# Patient Record
Sex: Female | Born: 1935
Health system: Southern US, Community
[De-identification: ages and names within clinical notes are randomized; demographics above are authoritative.]

## PROBLEM LIST (undated history)

## (undated) DIAGNOSIS — K219 Gastro-esophageal reflux disease without esophagitis: Secondary | ICD-10-CM

## (undated) DIAGNOSIS — M1990 Unspecified osteoarthritis, unspecified site: Secondary | ICD-10-CM

## (undated) DIAGNOSIS — I10 Essential (primary) hypertension: Secondary | ICD-10-CM

## (undated) DIAGNOSIS — K862 Cyst of pancreas: Secondary | ICD-10-CM

## (undated) DIAGNOSIS — R55 Syncope and collapse: Secondary | ICD-10-CM

## (undated) DIAGNOSIS — K754 Autoimmune hepatitis: Secondary | ICD-10-CM

## (undated) DIAGNOSIS — E785 Hyperlipidemia, unspecified: Secondary | ICD-10-CM

## (undated) DIAGNOSIS — K8020 Calculus of gallbladder without cholecystitis without obstruction: Secondary | ICD-10-CM

## (undated) DIAGNOSIS — C8590 Non-Hodgkin lymphoma, unspecified, unspecified site: Secondary | ICD-10-CM

## (undated) DIAGNOSIS — H269 Unspecified cataract: Secondary | ICD-10-CM

## (undated) DIAGNOSIS — Z923 Personal history of irradiation: Secondary | ICD-10-CM

## (undated) DIAGNOSIS — M8580 Other specified disorders of bone density and structure, unspecified site: Secondary | ICD-10-CM

## (undated) DIAGNOSIS — E039 Hypothyroidism, unspecified: Secondary | ICD-10-CM

## (undated) DIAGNOSIS — L570 Actinic keratosis: Secondary | ICD-10-CM

## (undated) DIAGNOSIS — C4492 Squamous cell carcinoma of skin, unspecified: Secondary | ICD-10-CM

## (undated) DIAGNOSIS — C73 Malignant neoplasm of thyroid gland: Secondary | ICD-10-CM

## (undated) HISTORY — DX: Cyst of pancreas: K86.2

## (undated) HISTORY — PX: APPENDECTOMY: SHX54

## (undated) HISTORY — DX: Other specified disorders of bone density and structure, unspecified site: M85.80

## (undated) HISTORY — PX: LIVER BIOPSY: SHX301

## (undated) HISTORY — DX: Unspecified cataract: H26.9

## (undated) HISTORY — DX: Gastro-esophageal reflux disease without esophagitis: K21.9

## (undated) HISTORY — DX: Squamous cell carcinoma of skin, unspecified: C44.92

## (undated) HISTORY — DX: Non-Hodgkin lymphoma, unspecified, unspecified site: C85.90

## (undated) HISTORY — PX: THYROIDECTOMY: SHX17

## (undated) HISTORY — DX: Unspecified osteoarthritis, unspecified site: M19.90

## (undated) HISTORY — DX: Calculus of gallbladder without cholecystitis without obstruction: K80.20

## (undated) HISTORY — DX: Syncope and collapse: R55

## (undated) HISTORY — DX: Personal history of irradiation: Z92.3

## (undated) HISTORY — DX: Hypothyroidism, unspecified: E03.9

## (undated) HISTORY — DX: Actinic keratosis: L57.0

## (undated) HISTORY — DX: Hyperlipidemia, unspecified: E78.5

## (undated) HISTORY — DX: Essential (primary) hypertension: I10

## (undated) HISTORY — DX: Autoimmune hepatitis: K75.4

## (undated) HISTORY — PX: EXPLORATORY LAPAROTOMY: SUR591

## (undated) HISTORY — DX: Malignant neoplasm of thyroid gland: C73

---
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
## 1970-10-27 HISTORY — PX: TUBAL LIGATION: SHX77

## 1998-01-25 ENCOUNTER — Encounter: Admission: RE | Admit: 1998-01-25 | Discharge: 1998-04-25 | Payer: Self-pay | Admitting: Family Medicine

## 1999-03-22 ENCOUNTER — Other Ambulatory Visit: Admission: RE | Admit: 1999-03-22 | Discharge: 1999-03-22 | Payer: Self-pay | Admitting: Family Medicine

## 2000-02-13 ENCOUNTER — Encounter: Payer: Self-pay | Admitting: Family Medicine

## 2000-02-13 ENCOUNTER — Encounter: Admission: RE | Admit: 2000-02-13 | Discharge: 2000-02-13 | Payer: Self-pay | Admitting: Family Medicine

## 2000-02-21 ENCOUNTER — Ambulatory Visit (HOSPITAL_COMMUNITY): Admission: RE | Admit: 2000-02-21 | Discharge: 2000-02-21 | Payer: Self-pay | Admitting: Internal Medicine

## 2000-03-12 ENCOUNTER — Observation Stay (HOSPITAL_COMMUNITY): Admission: RE | Admit: 2000-03-12 | Discharge: 2000-03-13 | Payer: Self-pay | Admitting: General Surgery

## 2000-03-12 ENCOUNTER — Encounter: Payer: Self-pay | Admitting: General Surgery

## 2000-03-12 ENCOUNTER — Encounter (INDEPENDENT_AMBULATORY_CARE_PROVIDER_SITE_OTHER): Payer: Self-pay

## 2000-05-22 ENCOUNTER — Other Ambulatory Visit: Admission: RE | Admit: 2000-05-22 | Discharge: 2000-05-22 | Payer: Self-pay | Admitting: Family Medicine

## 2001-05-28 ENCOUNTER — Other Ambulatory Visit: Admission: RE | Admit: 2001-05-28 | Discharge: 2001-05-28 | Payer: Self-pay | Admitting: Family Medicine

## 2002-06-10 ENCOUNTER — Other Ambulatory Visit: Admission: RE | Admit: 2002-06-10 | Discharge: 2002-06-10 | Payer: Self-pay | Admitting: Family Medicine

## 2003-07-13 ENCOUNTER — Other Ambulatory Visit: Admission: RE | Admit: 2003-07-13 | Discharge: 2003-07-13 | Payer: Self-pay | Admitting: Family Medicine

## 2004-07-15 ENCOUNTER — Other Ambulatory Visit: Admission: RE | Admit: 2004-07-15 | Discharge: 2004-07-15 | Payer: Self-pay | Admitting: Family Medicine

## 2004-09-12 ENCOUNTER — Ambulatory Visit: Payer: Self-pay | Admitting: Family Medicine

## 2005-07-14 ENCOUNTER — Ambulatory Visit: Payer: Self-pay | Admitting: Family Medicine

## 2005-07-21 ENCOUNTER — Other Ambulatory Visit: Admission: RE | Admit: 2005-07-21 | Discharge: 2005-07-21 | Payer: Self-pay | Admitting: Family Medicine

## 2005-07-21 ENCOUNTER — Ambulatory Visit: Payer: Self-pay | Admitting: Family Medicine

## 2005-07-21 ENCOUNTER — Encounter: Payer: Self-pay | Admitting: Family Medicine

## 2006-02-24 HISTORY — PX: CHOLECYSTECTOMY: SHX55

## 2006-07-22 ENCOUNTER — Encounter: Payer: Self-pay | Admitting: Internal Medicine

## 2006-07-22 ENCOUNTER — Ambulatory Visit: Payer: Self-pay | Admitting: Internal Medicine

## 2006-07-22 ENCOUNTER — Other Ambulatory Visit: Admission: RE | Admit: 2006-07-22 | Discharge: 2006-07-22 | Payer: Self-pay | Admitting: Internal Medicine

## 2006-08-05 ENCOUNTER — Ambulatory Visit: Payer: Self-pay | Admitting: Internal Medicine

## 2006-08-26 ENCOUNTER — Encounter: Payer: Self-pay | Admitting: Internal Medicine

## 2006-09-09 ENCOUNTER — Ambulatory Visit: Payer: Self-pay | Admitting: Internal Medicine

## 2007-02-19 ENCOUNTER — Ambulatory Visit: Payer: Self-pay | Admitting: Internal Medicine

## 2007-08-11 ENCOUNTER — Encounter: Payer: Self-pay | Admitting: Internal Medicine

## 2007-08-25 ENCOUNTER — Ambulatory Visit: Payer: Self-pay | Admitting: Internal Medicine

## 2007-08-25 DIAGNOSIS — M8580 Other specified disorders of bone density and structure, unspecified site: Secondary | ICD-10-CM | POA: Insufficient documentation

## 2007-08-25 DIAGNOSIS — C8589 Other specified types of non-Hodgkin lymphoma, extranodal and solid organ sites: Secondary | ICD-10-CM | POA: Insufficient documentation

## 2007-08-25 DIAGNOSIS — Z9089 Acquired absence of other organs: Secondary | ICD-10-CM | POA: Insufficient documentation

## 2007-08-25 DIAGNOSIS — E785 Hyperlipidemia, unspecified: Secondary | ICD-10-CM | POA: Insufficient documentation

## 2007-08-25 DIAGNOSIS — I10 Essential (primary) hypertension: Secondary | ICD-10-CM | POA: Insufficient documentation

## 2007-08-25 DIAGNOSIS — K219 Gastro-esophageal reflux disease without esophagitis: Secondary | ICD-10-CM | POA: Insufficient documentation

## 2007-09-02 ENCOUNTER — Ambulatory Visit: Payer: Self-pay | Admitting: Internal Medicine

## 2007-09-06 ENCOUNTER — Encounter (INDEPENDENT_AMBULATORY_CARE_PROVIDER_SITE_OTHER): Payer: Self-pay | Admitting: *Deleted

## 2008-01-11 ENCOUNTER — Ambulatory Visit: Payer: Self-pay | Admitting: Internal Medicine

## 2008-01-11 DIAGNOSIS — E039 Hypothyroidism, unspecified: Secondary | ICD-10-CM | POA: Insufficient documentation

## 2008-01-17 LAB — CONVERTED CEMR LAB: TSH: 2.3 microintl units/mL (ref 0.35–5.50)

## 2008-08-21 ENCOUNTER — Encounter: Payer: Self-pay | Admitting: Internal Medicine

## 2008-08-30 ENCOUNTER — Encounter: Payer: Self-pay | Admitting: Internal Medicine

## 2008-08-30 ENCOUNTER — Other Ambulatory Visit: Admission: RE | Admit: 2008-08-30 | Discharge: 2008-08-30 | Payer: Self-pay | Admitting: Internal Medicine

## 2008-08-30 ENCOUNTER — Ambulatory Visit: Payer: Self-pay | Admitting: Internal Medicine

## 2008-08-30 LAB — HM PAP SMEAR

## 2008-09-01 ENCOUNTER — Encounter (INDEPENDENT_AMBULATORY_CARE_PROVIDER_SITE_OTHER): Payer: Self-pay | Admitting: *Deleted

## 2008-09-06 ENCOUNTER — Telehealth (INDEPENDENT_AMBULATORY_CARE_PROVIDER_SITE_OTHER): Payer: Self-pay | Admitting: *Deleted

## 2008-09-08 ENCOUNTER — Ambulatory Visit: Payer: Self-pay | Admitting: Internal Medicine

## 2008-09-09 ENCOUNTER — Encounter: Payer: Self-pay | Admitting: Internal Medicine

## 2008-09-11 ENCOUNTER — Encounter (INDEPENDENT_AMBULATORY_CARE_PROVIDER_SITE_OTHER): Payer: Self-pay | Admitting: *Deleted

## 2008-09-11 LAB — CONVERTED CEMR LAB
OCCULT 1: NEGATIVE
OCCULT 2: NEGATIVE
OCCULT 3: NEGATIVE

## 2008-09-12 ENCOUNTER — Encounter (INDEPENDENT_AMBULATORY_CARE_PROVIDER_SITE_OTHER): Payer: Self-pay | Admitting: *Deleted

## 2008-09-12 ENCOUNTER — Telehealth (INDEPENDENT_AMBULATORY_CARE_PROVIDER_SITE_OTHER): Payer: Self-pay | Admitting: *Deleted

## 2008-09-13 ENCOUNTER — Telehealth (INDEPENDENT_AMBULATORY_CARE_PROVIDER_SITE_OTHER): Payer: Self-pay | Admitting: *Deleted

## 2008-09-13 LAB — CONVERTED CEMR LAB
ALT: 25 units/L (ref 0–35)
AST: 23 units/L (ref 0–37)
BUN: 13 mg/dL (ref 6–23)
Basophils Absolute: 0 10*3/uL (ref 0.0–0.1)
Basophils Relative: 0.7 % (ref 0.0–3.0)
CO2: 26 meq/L (ref 19–32)
Calcium: 9.4 mg/dL (ref 8.4–10.5)
Chloride: 110 meq/L (ref 96–112)
Cholesterol: 167 mg/dL (ref 0–200)
Creatinine, Ser: 0.9 mg/dL (ref 0.4–1.2)
Eosinophils Absolute: 0.3 10*3/uL (ref 0.0–0.7)
Eosinophils Relative: 6.4 % — ABNORMAL HIGH (ref 0.0–5.0)
GFR calc Af Amer: 79 mL/min
GFR calc non Af Amer: 65 mL/min
Glucose, Bld: 92 mg/dL (ref 70–99)
HCT: 38.3 % (ref 36.0–46.0)
HDL: 42.2 mg/dL (ref >39.0)
Hemoglobin: 13.4 g/dL (ref 12.0–15.0)
LDL Cholesterol: 107 mg/dL — ABNORMAL HIGH (ref 0–99)
Lymphocytes Relative: 20.4 % (ref 12.0–46.0)
MCHC: 35 g/dL (ref 30.0–36.0)
MCV: 88.1 fL (ref 78.0–100.0)
Monocytes Absolute: 0.3 10*3/uL (ref 0.1–1.0)
Monocytes Relative: 7 % (ref 3.0–12.0)
Neutro Abs: 3.4 10*3/uL (ref 1.4–7.7)
Neutrophils Relative %: 65.5 % (ref 43.0–77.0)
Platelets: 168 10*3/uL (ref 150–400)
Potassium: 3.9 meq/L (ref 3.5–5.1)
RBC: 4.35 M/uL (ref 3.87–5.11)
RDW: 13.3 % (ref 11.5–14.6)
Sodium: 144 meq/L (ref 135–145)
TSH: 2.75 microintl units/mL (ref 0.35–5.50)
Total CHOL/HDL Ratio: 4
Triglycerides: 90 mg/dL (ref 0–149)
VLDL: 18 mg/dL (ref 0–40)
Vit D, 1,25-Dihydroxy: 20 — ABNORMAL LOW (ref 30–89)
WBC: 5 10*3/uL (ref 4.5–10.5)

## 2008-09-15 ENCOUNTER — Encounter: Payer: Self-pay | Admitting: Internal Medicine

## 2008-09-15 ENCOUNTER — Ambulatory Visit: Payer: Self-pay | Admitting: Internal Medicine

## 2008-09-18 ENCOUNTER — Telehealth (INDEPENDENT_AMBULATORY_CARE_PROVIDER_SITE_OTHER): Payer: Self-pay | Admitting: *Deleted

## 2008-10-11 ENCOUNTER — Telehealth (INDEPENDENT_AMBULATORY_CARE_PROVIDER_SITE_OTHER): Payer: Self-pay | Admitting: *Deleted

## 2009-04-09 ENCOUNTER — Ambulatory Visit: Payer: Self-pay | Admitting: Internal Medicine

## 2009-06-20 ENCOUNTER — Telehealth: Payer: Self-pay | Admitting: Internal Medicine

## 2009-06-21 ENCOUNTER — Telehealth (INDEPENDENT_AMBULATORY_CARE_PROVIDER_SITE_OTHER): Payer: Self-pay | Admitting: *Deleted

## 2009-08-03 ENCOUNTER — Ambulatory Visit: Payer: Self-pay | Admitting: Internal Medicine

## 2009-08-09 LAB — CONVERTED CEMR LAB
BUN: 14 mg/dL (ref 6–23)
CO2: 26 meq/L (ref 19–32)
Calcium: 9.5 mg/dL (ref 8.4–10.5)
Chloride: 101 meq/L (ref 96–112)
Creatinine, Ser: 1 mg/dL (ref 0.4–1.2)
GFR calc non Af Amer: 57.65 mL/min (ref >60)
Glucose, Bld: 107 mg/dL — ABNORMAL HIGH (ref 70–99)
Potassium: 4.5 meq/L (ref 3.5–5.1)
Sodium: 139 meq/L (ref 135–145)
TSH: 1.17 microintl units/mL (ref 0.35–5.50)

## 2009-08-22 ENCOUNTER — Encounter: Payer: Self-pay | Admitting: Internal Medicine

## 2009-12-26 ENCOUNTER — Telehealth (INDEPENDENT_AMBULATORY_CARE_PROVIDER_SITE_OTHER): Payer: Self-pay | Admitting: *Deleted

## 2010-01-03 ENCOUNTER — Encounter (INDEPENDENT_AMBULATORY_CARE_PROVIDER_SITE_OTHER): Payer: Self-pay | Admitting: *Deleted

## 2010-01-03 ENCOUNTER — Ambulatory Visit: Payer: Self-pay | Admitting: Internal Medicine

## 2010-01-04 ENCOUNTER — Encounter: Payer: Self-pay | Admitting: Internal Medicine

## 2010-01-04 LAB — CONVERTED CEMR LAB: Vit D, 25-Hydroxy: 33 ng/mL (ref 30–89)

## 2010-01-07 LAB — CONVERTED CEMR LAB
ALT: 29 units/L (ref 0–35)
AST: 28 units/L (ref 0–37)
Basophils Absolute: 0 10*3/uL (ref 0.0–0.1)
Basophils Relative: 0.1 % (ref 0.0–3.0)
Cholesterol: 175 mg/dL (ref 0–200)
Eosinophils Absolute: 0.3 10*3/uL (ref 0.0–0.7)
Eosinophils Relative: 5.7 % — ABNORMAL HIGH (ref 0.0–5.0)
HCT: 40.4 % (ref 36.0–46.0)
HDL: 52.1 mg/dL (ref >39.00)
Hemoglobin: 13.5 g/dL (ref 12.0–15.0)
LDL Cholesterol: 89 mg/dL (ref 0–99)
Lymphocytes Relative: 23.3 % (ref 12.0–46.0)
Lymphs Abs: 1.1 10*3/uL (ref 0.7–4.0)
MCHC: 33.3 g/dL (ref 30.0–36.0)
MCV: 90 fL (ref 78.0–100.0)
Monocytes Absolute: 0.4 10*3/uL (ref 0.1–1.0)
Monocytes Relative: 8 % (ref 3.0–12.0)
Neutro Abs: 2.9 10*3/uL (ref 1.4–7.7)
Neutrophils Relative %: 62.9 % (ref 43.0–77.0)
Platelets: 187 10*3/uL (ref 150.0–400.0)
RBC: 4.49 M/uL (ref 3.87–5.11)
RDW: 13.6 % (ref 11.5–14.6)
TSH: 0.98 microintl units/mL (ref 0.35–5.50)
Total CHOL/HDL Ratio: 3
Triglycerides: 170 mg/dL — ABNORMAL HIGH (ref 0.0–149.0)
VLDL: 34 mg/dL (ref 0.0–40.0)
WBC: 4.7 10*3/uL (ref 4.5–10.5)

## 2010-01-22 ENCOUNTER — Ambulatory Visit: Payer: Self-pay | Admitting: Internal Medicine

## 2010-01-26 LAB — CONVERTED CEMR LAB: Fecal Occult Bld: NEGATIVE

## 2010-03-18 ENCOUNTER — Telehealth (INDEPENDENT_AMBULATORY_CARE_PROVIDER_SITE_OTHER): Payer: Self-pay | Admitting: *Deleted

## 2010-04-26 ENCOUNTER — Ambulatory Visit: Payer: Self-pay | Admitting: Internal Medicine

## 2010-04-26 DIAGNOSIS — M1990 Unspecified osteoarthritis, unspecified site: Secondary | ICD-10-CM | POA: Insufficient documentation

## 2010-07-02 ENCOUNTER — Telehealth (INDEPENDENT_AMBULATORY_CARE_PROVIDER_SITE_OTHER): Payer: Self-pay | Admitting: *Deleted

## 2010-07-03 ENCOUNTER — Telehealth (INDEPENDENT_AMBULATORY_CARE_PROVIDER_SITE_OTHER): Payer: Self-pay | Admitting: *Deleted

## 2010-07-04 ENCOUNTER — Telehealth (INDEPENDENT_AMBULATORY_CARE_PROVIDER_SITE_OTHER): Payer: Self-pay | Admitting: *Deleted

## 2010-08-20 ENCOUNTER — Ambulatory Visit: Payer: Self-pay | Admitting: Internal Medicine

## 2010-08-21 ENCOUNTER — Telehealth: Payer: Self-pay | Admitting: Internal Medicine

## 2010-08-22 LAB — CONVERTED CEMR LAB
BUN: 18 mg/dL (ref 6–23)
CO2: 27 meq/L (ref 19–32)
Calcium: 9.2 mg/dL (ref 8.4–10.5)
Chloride: 101 meq/L (ref 96–112)
Creatinine, Ser: 0.9 mg/dL (ref 0.4–1.2)
GFR calc non Af Amer: 63.3 mL/min (ref >60)
Glucose, Bld: 119 mg/dL — ABNORMAL HIGH (ref 70–99)
Potassium: 4.3 meq/L (ref 3.5–5.1)
Sodium: 139 meq/L (ref 135–145)

## 2010-08-23 ENCOUNTER — Encounter: Payer: Self-pay | Admitting: Internal Medicine

## 2010-08-24 LAB — HM MAMMOGRAPHY: HM Mammogram: NORMAL

## 2010-09-05 ENCOUNTER — Encounter (INDEPENDENT_AMBULATORY_CARE_PROVIDER_SITE_OTHER): Payer: Self-pay | Admitting: *Deleted

## 2010-09-17 ENCOUNTER — Telehealth (INDEPENDENT_AMBULATORY_CARE_PROVIDER_SITE_OTHER): Payer: Self-pay | Admitting: *Deleted

## 2010-11-24 LAB — CONVERTED CEMR LAB
ALT: 30 units/L (ref 0–35)
AST: 23 units/L (ref 0–37)
BUN: 19 mg/dL (ref 6–23)
Basophils Absolute: 0 10*3/uL (ref 0.0–0.1)
Basophils Relative: 0.5 % (ref 0.0–1.0)
CO2: 33 meq/L — ABNORMAL HIGH (ref 19–32)
Calcium: 9.7 mg/dL (ref 8.4–10.5)
Chloride: 103 meq/L (ref 96–112)
Cholesterol: 189 mg/dL (ref 0–200)
Creatinine, Ser: 1.1 mg/dL (ref 0.4–1.2)
Direct LDL: 118.5 mg/dL
Eosinophils Absolute: 0.3 10*3/uL (ref 0.0–0.6)
Eosinophils Relative: 4.9 % (ref 0.0–5.0)
GFR calc Af Amer: 63 mL/min
GFR calc non Af Amer: 52 mL/min
Glucose, Bld: 97 mg/dL (ref 70–99)
HCT: 39.6 % (ref 36.0–46.0)
HDL: 44.4 mg/dL (ref >39.0)
Hemoglobin: 13.8 g/dL (ref 12.0–15.0)
Lymphocytes Relative: 21.4 % (ref 12.0–46.0)
MCHC: 34.9 g/dL (ref 30.0–36.0)
MCV: 89.1 fL (ref 78.0–100.0)
Monocytes Absolute: 0.5 10*3/uL (ref 0.2–0.7)
Monocytes Relative: 8.3 % (ref 3.0–11.0)
Neutro Abs: 3.8 10*3/uL (ref 1.4–7.7)
Neutrophils Relative %: 64.9 % (ref 43.0–77.0)
Pap Smear: NORMAL
Platelets: 210 10*3/uL (ref 150–400)
Potassium: 4.2 meq/L (ref 3.5–5.1)
RBC: 4.44 M/uL (ref 3.87–5.11)
RDW: 13.6 % (ref 11.5–14.6)
Sodium: 142 meq/L (ref 135–145)
TSH: 5.29 microintl units/mL (ref 0.35–5.50)
Total CHOL/HDL Ratio: 4.3
Triglycerides: 202 mg/dL (ref 0–149)
VLDL: 40 mg/dL (ref 0–40)
WBC: 5.8 10*3/uL (ref 4.5–10.5)

## 2010-11-26 NOTE — Progress Notes (Signed)
Summary: paz--refill  Phone Note Refill Request   Refills Requested: Medication #1:  HYDROCHLOROTHIAZIDE 25 MG  TABS 1 by mouth once daily  Medication #2:  SIMVASTATIN 20 MG  TABS 1/2 qd [facility] on [address] --[phone]  Initial call taken by: Velora Heckler,  September 12, 2008 10:21 AM      Prescriptions: SIMVASTATIN 20 MG  TABS (SIMVASTATIN) 1/2 qd  #30 x 5   Entered by:   Verdie Mosher   Authorized by:   Alda Berthold. Paz MD   Signed by:   Verdie Mosher on 09/12/2008   Method used:   Faxed to ...       [facility].* (retail)       [phone]       [place], [place]  [address]       Ph: [phone]       Fax: [phone]   RxID:   [id number] HYDROCHLOROTHIAZIDE 25 MG  TABS (HYDROCHLOROTHIAZIDE) 1 by mouth once daily  #30 x 5   Entered by:   Verdie Mosher   Authorized by:   Alda Berthold. Paz MD   Signed by:   Verdie Mosher on 09/12/2008   Method used:   Faxed to ...       [facility].* (retail)       [phone]       [place], [place]  [address]       Ph: [phone]       Fax: [phone]   RxID:   [id number]

## 2010-11-26 NOTE — Miscellaneous (Signed)
Summary: BONE DENSITY  Clinical Lists Changes  Orders: Added new Test order of T-Bone Densitometry [phone]) - Signed Added new Test order of T-Lumbar Vertebral Assessment [phone]) - Signed

## 2010-11-26 NOTE — Miscellaneous (Signed)
Summary: mammo  Clinical Lists Changes  Observations: Added new observation of MAMMOGRAM: normal (08/24/2010 7:42)      Preventive Care Screening  Mammogram:    Date:  08/24/2010    Results:  normal

## 2010-11-26 NOTE — Assessment & Plan Note (Signed)
Summary: 4MONTH RETURN VISIT/RH......Marland Kitchen   Vital Signs:  Patient profile:   77 year old female Weight:      159.25 pounds Pulse rate:   85 / minute Pulse rhythm:   regular BP sitting:   122 / 82  (left arm) Cuff size:   regular  Vitals Entered By: Allyn Kenner CMA (August 20, 2010 10:51 AM) CC: 6 month f/u- not fasting  Comments flu shot [facility]- [facility]    History of Present Illness: routine office visit, feeling well She went to see orthopedic surgeon for her neck pain, diagnosed with DJD, referred to physical therapy   which helped her a lot  Review of systems Denies chest pain or lower extremity edema just developed a yeast infection w/  vaginal itching, started over-the-counter medication yesterday, will call if not better in a few days. Denies dysuria Ambulatory SBP in the 110 range. Good medication compliance with all medications  Current Medications (verified): 1)  Hydrochlorothiazide 25 Mg  Tabs (Hydrochlorothiazide) .Marland Kitchen.. 1 By Mouth Once Daily 2)  Synthroid 100 Mcg  Tabs (Levothyroxine Sodium) .... Qd 3)  Bayer Low Strength 81 Mg  Tbec (Aspirin) .... Qd 4)  Simvastatin 20 Mg  Tabs (Simvastatin) .... 1/2 Qd 5)  Fosamax 70 Mg  Tabs (Alendronate Sodium) .... Q Wk 6)  Omeprazole 20 Mg Cpdr (Omeprazole) .Marland Kitchen.. 1 Daily 7)  Calcium-Vitamin D 250-125 Mg-Unit Tabs (Calcium Carbonate-Vitamin D) 8)  Tylenol Extra Strength 500 Mg Tabs (Acetaminophen) .Marland Kitchen.. 1 By Mouth Daily  Allergies (verified): 1)  ! Codeine  Past History:  Past Medical History: GERD, EGD for dysphagia 2000 aprox (-) Hyperlipidemia Osteopenia  Hypertension LYMPHOMA NECK, MLIG, UNSPECIFIED SITE dx 1970s  Hypothyroidism OA, severe at neck  Past Surgical History: Reviewed history from 04/09/2009 and no changes required. Tubal ligation (1972) Cholecystectomy (02/24/2006) Appendectomy THYROIDECTOMY   Social History: Reviewed history from 01/03/2010 and no changes required. Retired, worked at a  [employer]  Married 3 kids, 67 GK [facility]-- improved  tobacco-- never ETOH--no  Physical Exam  General:  alert, well-developed, and well-nourished.   Lungs:  normal respiratory effort, no intercostal retractions, no accessory muscle use, and normal breath sounds.   Heart:  normal rate, regular rhythm, and no murmur.   Extremities:  no edema   Impression & Recommendations:  Problem # 1:  DEGENERATIVE JOINT DISEASE (ICD-715.90) saw  orthopedic surgery few weeks ago d/t  neck pain, diagnosed with DJD, status post PT, doing better Her updated medication list for this problem includes:    Bayer Low Strength 81 Mg Tbec (Aspirin) ..... Qd    Tylenol Extra Strength 500 Mg Tabs (Acetaminophen) .Marland Kitchen... 1 by mouth daily  Problem # 2:  HYPOTHYROIDISM (ICD-244.9) stable, no change Her updated medication list for this problem includes:    Synthroid 100 Mcg Tabs (Levothyroxine sodium) ..... Qd  Labs Reviewed: TSH: 0.98 (01/03/2010)    Chol: 175 (01/03/2010)   HDL: 52.10 (01/03/2010)   LDL: 89 (01/03/2010)   TG: 170.0 (01/03/2010)  Problem # 3:  HYPERTENSION (ICD-401.9) well-controlled, check a BNP Her updated medication list for this problem includes:    Hydrochlorothiazide 25 Mg Tabs (Hydrochlorothiazide) .Marland Kitchen... 1 by mouth once daily  Orders: Venipuncture (29518) TLB-BMP (Basic Metabolic Panel-BMET) (84166-AYTKZSW) Specimen Handling (99000)  BP today: 122/82 Prior BP: 122/80 (04/26/2010)  Labs Reviewed: K+: 4.5 (08/03/2009) Creat: : 1.0 (08/03/2009)   Chol: 175 (01/03/2010)   HDL: 52.10 (01/03/2010)   LDL: 89 (01/03/2010)  TG: 170.0 (01/03/2010)  Problem # 4:  yeast infection see review of systems, will call if not better    Complete Medication List: 1)  Hydrochlorothiazide 25 Mg Tabs (Hydrochlorothiazide) .Marland Kitchen.. 1 by mouth once daily 2)  Synthroid 100 Mcg Tabs (Levothyroxine sodium) .... Qd 3)  Bayer Low Strength 81 Mg Tbec (Aspirin) .... Qd 4)   Simvastatin 20 Mg Tabs (Simvastatin) .... 1/2 qd 5)  Fosamax 70 Mg Tabs (Alendronate sodium) .... Q wk 6)  Omeprazole 20 Mg Cpdr (Omeprazole) .Marland Kitchen.. 1 daily 7)  Calcium-vitamin D 250-125 Mg-unit Tabs (Calcium carbonate-vitamin d) 8)  Tylenol Extra Strength 500 Mg Tabs (Acetaminophen) .Marland Kitchen.. 1 by mouth daily  Other Orders: Flu Vaccine 41yr + MEDICARE PATIENTS ((T1959 Administration Flu vaccine - MCR ((D4718  Patient Instructions: 1)  Please schedule a follow-up appointment in 5 to  6 months ,  fasting, physical exam   Orders Added: 1)  Flu Vaccine 369yr+ MEDICARE PATIENTS [Q2039] 2)  Administration Flu vaccine - MCR [G[Z5015])  Venipuncture [3[86825])  TLB-BMP (Basic Metabolic Panel-BMET) [8[74935-LEZVGJF])  Specimen Handling [99000] 6)  Est. Patient Level III [9[59539]lu Vaccine Consent Questions     Do you have a history of severe allergic reactions to this vaccine? no    Any prior history of allergic reactions to egg and/or gelatin? no    Do you have a sensitivity to the preservative Thimersol? no    Do you have a past history of Guillan-Barre Syndrome? no    Do you currently have an acute febrile illness? no    Have you ever had a severe reaction to latex? no    Vaccine information given and explained to patient? yes    Are you currently pregnant? no    Lot Number:[id number]   Exp Date:04/26/2011   Site Given  Left Deltoid IMration Flu vaccine - MCR [G[Y7289]    .[email or web address]

## 2010-11-26 NOTE — Assessment & Plan Note (Signed)
Summary: stiff neck/cbs   Vital Signs:  Patient profile:   77 year old female Weight:      162.13 pounds Pulse rate:   76 / minute Pulse rhythm:   regular BP sitting:   122 / 80  (left arm) Cuff size:   large  Vitals Entered By: Allyn Kenner CMA (April 26, 2010 1:51 PM) CC: Stiff neck x 2-3 weeks    History of Present Illness: long history of neck pain and neck deformity 2 weeks ago, the pain become more noticeable. No radiation Taking Tylenol as needed with some relief  Review of systems  denies any recent fall or injury Normal upper extremities paresthesias in No previous workup for his neck deformity, no x-rays or orthopedic evaluation  Allergies: 1)  ! Codeine  Past History:  Past Medical History: Reviewed history from 01/03/2010 and no changes required. GERD, EGD for dysphagia 2000 aprox (-) Hyperlipidemia Osteopenia  Hypertension LYMPHOMA NECK, MLIG, UNSPECIFIED SITE dx 1970s  Hypothyroidism  Past Surgical History: Reviewed history from 04/09/2009 and no changes required. Tubal ligation (1972) Cholecystectomy (02/24/2006) Appendectomy THYROIDECTOMY   Review of Systems      See HPI  Physical Exam  General:  alert and well-developed.   Neck:  neck range of motion is moderately limited, mostly when she tries to extend the neck. The neck  seems to be tilted forward from the base Extremities:  no lower extremity edema Neurologic:  alert & oriented X3, strength normal in all extremities, gait normal, and finger-to-nose normal.   Psych:  not anxious appearing and not depressed appearing.     Impression & Recommendations:  Problem # 1:  DEGENERATIVE JOINT DISEASE (ICD-715.90)  chronic neck pain and a chronic neck deformity likely from DJD rec.  orthopedic  to see her--Dr Rhona Raider or [facility]  Continue with Tylenol, add Flexeril Warned about drowsiness Her updated medication list for this problem includes:    Bayer Low Strength 81 Mg Tbec (Aspirin)  ..... Qd    Tylenol Extra Strength 500 Mg Tabs (Acetaminophen) .Marland Kitchen... 1 by mouth daily  Orders: Orthopedic Referral (Ortho)  Complete Medication List: 1)  Hydrochlorothiazide 25 Mg Tabs (Hydrochlorothiazide) .Marland Kitchen.. 1 by mouth once daily 2)  Synthroid 100 Mcg Tabs (Levothyroxine sodium) .... Qd 3)  Bayer Low Strength 81 Mg Tbec (Aspirin) .... Qd 4)  Simvastatin 20 Mg Tabs (Simvastatin) .... 1/2 qd 5)  Fosamax 70 Mg Tabs (Alendronate sodium) .... Q wk 6)  Otc Acid Reducer  7)  Omeprazole 20 Mg Cpdr (Omeprazole) .Marland Kitchen.. 1 daily 8)  Calcium-vitamin D 250-125 Mg-unit Tabs (Calcium carbonate-vitamin d) 9)  Tylenol Extra Strength 500 Mg Tabs (Acetaminophen) .Marland Kitchen.. 1 by mouth daily 10)  Flexeril 10 Mg Tabs (Cyclobenzaprine hcl) .... One by mouth at bedtime as needed for pain Prescriptions: FLEXERIL 10 MG TABS (CYCLOBENZAPRINE HCL) one by mouth at bedtime as needed for pain  #30 x 1   Entered and Authorized by:   Jacqulyn Bath E. Virlan Kempker MD   Signed by:   Alda Berthold. Severn Goddard MD on 04/26/2010   Method used:   Print then Give to Patient   RxID:   [id number]

## 2010-11-26 NOTE — Progress Notes (Signed)
Summary: Refill Request  Phone Note Refill Request Call back at [phone] Message from:  Pharmacy on Mar 18, 2010 8:56 AM  Refills Requested: Medication #1:  SYNTHROID 100 MCG  TABS qd   Dosage confirmed as above?Dosage Confirmed   Brand Name Necessary? No   Supply Requested: 3 months [facility]  Next Appointment Scheduled: none Initial call taken by: Elna Breslow,  Mar 18, 2010 8:57 AM  Follow-up for Phone Call        [facility]ED TO [facility] [phone] .Verdie Mosher  Mar 18, 2010 12:32 PM  Follow-up by: Verdie Mosher,  Mar 18, 2010 12:32 PM    Prescriptions: SYNTHROID 100 MCG  TABS (LEVOTHYROXINE SODIUM) qd  #90 x 1   Entered by:   Verdie Mosher   Authorized by:   Alda Berthold. Paz MD   Signed by:   Verdie Mosher on 03/18/2010   Method used:   Printed then faxed to ...       [facility] (mail-order)             , [place]         Ph: [phone]       Fax: [phone]   RxID:   [id number]

## 2010-11-26 NOTE — Letter (Signed)
Summary: Primary Care Consult Scheduled Letter  [facility]   [place], [place] [address]   Phone: [phone]  Fax: [phone]      09/01/2008 MRN: [id number]  [facility] [place], [place]  [address]    Dear Angela Clay,      We have scheduled an appointment for you.  At the recommendation of Dr.Paz, we have scheduled you a bone density at [facility] in the basement on November 20th at 10:30am.  Their address is [place]. The office phone number is [phone].  If this appointment day and time is not convenient for you, please feel free to call the office of the doctor you are being referred to at the number listed above and reschedule the appointment.     It is important for you to keep your scheduled appointments. We are here to make sure you are given good patient care. If you have questions or you have made changes to your appointment, please notify us at  [phone] , ask for Tiffany.    Thank you,  Patient Care Coordinator [place] at [facility]

## 2010-11-26 NOTE — Assessment & Plan Note (Signed)
Summary: acute/cough/alr   Vital Signs:  Patient profile:   77 year old female Height:      64 inches Weight:      174.6 pounds BMI:     30.08 Temp:     98 degrees F oral BP sitting:   142 / 80  (left arm) Cuff size:   large  Vitals Entered By: Dawson Bills (April 09, 2009 11:42 AM) CC: ACUTE ONLY Comments  - cough x 2 weeks  - productive this am ("brown glob")  - little PN drip  - hoarse Dawson Bills  April 09, 2009 11:42 AM    History of Present Illness: ACUTE ONLY  - cough x 2 weeks, mild   - productive this am ("brown glob")  - little PN drip,  hoarse  Current Medications (verified): 1)  Hydrochlorothiazide 25 Mg  Tabs (Hydrochlorothiazide) .Marland Kitchen.. 1 By Mouth Once Daily 2)  Synthroid 100 Mcg  Tabs (Levothyroxine Sodium) .... Qd 3)  Bayer Low Strength 81 Mg  Tbec (Aspirin) .... Qd 4)  Simvastatin 20 Mg  Tabs (Simvastatin) .... 1/2 Qd 5)  Fosamax 70 Mg  Tabs (Alendronate Sodium) .... Q Wk 6)  Otc Acid Reducer 7)  Premarin 0.625 Mg/gm  Crea (Estrogens, Conjugated) .Marland Kitchen.. 1gr  Vaginally Daily X 4 Weeks, The 3 Times A Week Only 8)  Prilosec Otc 20 Mg  Tbec (Omeprazole Magnesium) .Marland Kitchen.. 1 By Mouth Once Daily  Before Breakfast  Allergies (verified): 1)  ! Codeine  Past History:  Past Medical History: Reviewed history from 08/30/2008 and no changes required. GERD, EGD for dysphagia 2000 aprox (-) Hyperlipidemia Osteopenia, DEXA 2007 Hypertension LYMPHOMA NECK, MLIG, UNSPECIFIED SITE  Hypothyroidism  Past Surgical History: Tubal ligation (2353) Cholecystectomy (02/24/2006) Appendectomy THYROIDECTOMY   Social History: Reviewed history from 08/30/2008 and no changes required. Retired Married 3 kids, 8 [place]:  Denies fever. ENT:  Denies sore throat; no ear pain . Resp:  wheezing a little last week?. MS:  Denies muscle aches. Neuro:  had dizzines last week.  Physical Exam  General:  alert and well-developed.   Ears:  R ear normal and  L ear normal.   Mouth:  good dentition and pharynx pink and moist.   Lungs:  normal respiratory effort, no intercostal retractions, and no accessory muscle use.  few ronchi w/  cough only, no wheezing  Heart:  normal rate, regular rhythm, and no murmur.     Impression & Recommendations:  Problem # 1:  BRONCHITIS- ACUTE (ICD-466.0)  see instructions   Her updated medication list for this problem includes:    Doxycycline Hyclate 100 Mg Caps (Doxycycline hyclate) .Marland Kitchen... 1 by mouth two times a day  Problem # 2:  due for OV, pt aware  Complete Medication List: 1)  Hydrochlorothiazide 25 Mg Tabs (Hydrochlorothiazide) .Marland Kitchen.. 1 by mouth once daily 2)  Synthroid 100 Mcg Tabs (Levothyroxine sodium) .... Qd 3)  Bayer Low Strength 81 Mg Tbec (Aspirin) .... Qd 4)  Simvastatin 20 Mg Tabs (Simvastatin) .... 1/2 qd 5)  Fosamax 70 Mg Tabs (Alendronate sodium) .... Q wk 6)  Otc Acid Reducer  7)  Premarin 0.625 Mg/gm Crea (Estrogens, conjugated) .Marland Kitchen.. 1gr  vaginally daily x 4 weeks, the 3 times a week only 8)  Prilosec Otc 20 Mg Tbec (Omeprazole magnesium) .Marland Kitchen.. 1 by mouth once daily  before breakfast 9)  Doxycycline Hyclate 100 Mg Caps (Doxycycline hyclate) .Marland Kitchen.. 1 by mouth two times a day  Patient Instructions: 1)  Get plenty of rest, drink lots of clear liquids, and use Tylenol 2)  mucinex DM for cough 3)  antibiotic x 1 week (doxycycline) ----> avoid excessive sun exposure  4)  Return in 7-10 days if you're not better: sooner if you are  feeling worse.  Prescriptions: DOXYCYCLINE HYCLATE 100 MG CAPS (DOXYCYCLINE HYCLATE) 1 by mouth two times a day  #14 x 0   Entered and Authorized by:   Alda Berthold. Kamrin Spath MD   Signed by:   Alda Berthold. Kayleb Warshaw MD on 04/09/2009   Method used:   Electronically to        [facility].* (retail)       [phone]       [place], [place]  [address]       Ph: [phone]       Fax: [phone]   RxID:   [id number]

## 2010-11-26 NOTE — Assessment & Plan Note (Signed)
Summary: 4 MONTH ROA/CDJ   Vital Signs:  Patient Profile:   77 Years Old Female Height:     64.75 inches Weight:      181.4 pounds Pulse rate:   80 / minute BP sitting:   120 / 80  Vitals Entered By: Dawson Bills (January 11, 2008 1:31 PM)                 Chief Complaint:  ROV - NO CONCERNS.  History of Present Illness: ROV  GERD--on OTC , still occ sx depending on food occ dysphagia at baseline, see last OV note no odynophagia OSTEOPENIA--no CP after fosamax HYPERTENSION-- good amb BPs      Updated Prior Medication List: HYDROCHLOROTHIAZIDE 25 MG  TABS (HYDROCHLOROTHIAZIDE) 1 by mouth once daily SYNTHROID 100 MCG  TABS (LEVOTHYROXINE SODIUM) qd BAYER LOW STRENGTH 81 MG  TBEC (ASPIRIN) qd SIMVASTATIN 20 MG  TABS (SIMVASTATIN) 1/2 qd FOSAMAX 70 MG  TABS (ALENDRONATE SODIUM) q wk * OTC ACID REDUCER  DICLOFENAC SODIUM 75 MG  TBEC (DICLOFENAC SODIUM) prn PREMARIN 0.625 MG/GM  CREA (ESTROGENS, CONJUGATED) 1gr  vaginally daily x 4 weeks, the 3 times a week only  Current Allergies (reviewed today): ! CODEINE  Past Medical History:    GERD    Hyperlipidemia    Osteopenia, DEXA 2007    Hypertension    LYMPHOMA NEC, MLIG, UNSPECIFIED SITE     Hypothyroidism  Past Surgical History:    Tubal ligation (6967)    Cholecystectomy (02/24/2006)    Appendectomy    THYROIDECTOMY, HX OF (ICD-V45.79)    Risk Factors: Tobacco use:  never Alcohol use:  no  Mammogram History:    Date of Last Mammogram:  08/11/2007  PAP Smear History:    Date of Last PAP Smear:  07/28/2006   Review of Systems      See HPI  CV      Denies chest pain or discomfort and swelling of feet.  Resp      Denies shortness of breath.   Physical Exam  General:     alert, well-developed, and well-nourished.   Lungs:     normal respiratory effort, no intercostal retractions, no accessory muscle use, and normal breath sounds.   Heart:     normal rate, regular rhythm, and no murmur.      Impression & Recommendations:  Problem # 1:  HYPOTHYROIDISM (ICD-244.9) re check TSH  adjust med? Her updated medication list for this problem includes:    Synthroid 100 Mcg Tabs (Levothyroxine sodium) ..... Qd  Orders: TLB-TSH (Thyroid Stimulating Hormone) (84443-TSH) Venipuncture (89381)  Labs Reviewed: TSH: 5.29 (08/25/2007)    Chol: 189 (08/25/2007)   HDL: 44.4 (08/25/2007)   LDL: DEL (08/25/2007)   TG: 202 (08/25/2007)   Problem # 2:  GERD (ICD-530.81) rec prilosec once daily  goal: near zero sx Her updated medication list for this problem includes:    Prilosec Otc 20 Mg Tbec (Omeprazole magnesium) .Marland Kitchen... 1 by mouth once daily  before breakfast   Complete Medication List: 1)  Hydrochlorothiazide 25 Mg Tabs (Hydrochlorothiazide) .Marland Kitchen.. 1 by mouth once daily 2)  Synthroid 100 Mcg Tabs (Levothyroxine sodium) .... Qd 3)  Bayer Low Strength 81 Mg Tbec (Aspirin) .... Qd 4)  Simvastatin 20 Mg Tabs (Simvastatin) .... 1/2 qd 5)  Fosamax 70 Mg Tabs (Alendronate sodium) .... Q wk 6)  Otc Acid Reducer  7)  Diclofenac Sodium 75 Mg Tbec (Diclofenac sodium) .... Prn 8)  Premarin 0.625 Mg/gm Crea (  Estrogens, conjugated) .Marland Kitchen.. 1gr  vaginally daily x 4 weeks, the 3 times a week only 9)  Prilosec Otc 20 Mg Tbec (Omeprazole magnesium) .Marland Kitchen.. 1 by mouth once daily  before breakfast   Patient Instructions: 1)  Please schedule a follow-up appointment in 4 to 5  months.    Prescriptions: PRILOSEC OTC 20 MG  TBEC (OMEPRAZOLE MAGNESIUM) 1 by mouth once daily  before breakfast  #30 x 12   Entered and Authorized by:   Shunsuke Granzow E. Lauralee Waters MD   Signed by:   Alda Berthold. Naveah Brave MD on 01/11/2008   Method used:   Print then Give to Patient   RxID:   [id number]  ]

## 2010-11-26 NOTE — Assessment & Plan Note (Signed)
Summary: yearly   /KDC   Vital Signs:  Patient profile:   77 year old female Height:      64 inches Weight:      160 pounds BMI:     27.56 Pulse rate:   74 / minute BP sitting:   120 / 78  (left arm)  Vitals Entered By: Malachi Bonds (January 03, 2010 10:00 AM) CC: CPX    History of Present Illness: yearly checkup, chart reviewed  GERD-- no heartburn, prilosec works   Hyperlipidemia-- good medication compliance, no s/e ,life style great (see SH)   Hypertension-- good medication compliance, ambulatory BPs WNL  Hypothyroidism-- good medication compliance   d/c vag HRT  (cost)   Allergies: 1)  ! Codeine  Past History:  Past Medical History: GERD, EGD for dysphagia 2000 aprox (-) Hyperlipidemia Osteopenia  Hypertension LYMPHOMA NECK, MLIG, UNSPECIFIED SITE dx 1970s  Hypothyroidism  Past Surgical History: Reviewed history from 04/09/2009 and no changes required. 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 CV:  Denies chest pain or discomfort and swelling of feet. Resp:  Denies cough, shortness of breath, and wheezing. GI:  Denies bloody stools, nausea, and vomiting. GU:  no vag bleed . Psych:  Denies anxiety and depression.  Physical Exam  General:  alert, well-developed, and well-nourished.   Neck:  no masses and no cervical lymphadenopathy.   Breasts:  No mass, nodules, thickening, tenderness, bulging, retraction, inflamation, nipple discharge or skin changes noted.  no axilary LADs Lungs:  normal respiratory effort, no intercostal retractions, no accessory muscle use, and normal breath sounds.   Heart:  normal rate, regular rhythm, and no  murmur.   Abdomen:  soft, non-tender, no distention, and no masses.   Extremities:  no pretibial edema bilaterally  Psych:  Cognition and judgment appear intact. Alert and cooperative with normal attention span and concentration.  not anxious appearing and not depressed appearing.     Impression & Recommendations:  Problem # 1:  OSTEOPENIA (ICD-733.90) DEXA  08/2008 showed  stable osteopenia she is taking Fosamax since approximately 2007, takes calcium -vitamin D and exercise Plan:we agreed to d/c fosamax on 12-11 (holiday) and recheck a bone density test in 1 or 2  years Her updated medication list for this problem includes:    Fosamax 70 Mg Tabs (Alendronate sodium) ..... Q wk  Orders: T-Vitamin D (25-Hydroxy) [phone])  Problem # 2:  HEALTH SCREENING (ICD-V70.0) Td  11-09 Pneumina shot 2008 printed material provided regards shingles shot   never Cscope , (-) hemocults 11-08, 11-09 risk benefits of Cscope discussed, iFOB provided, Call if like Cscope   breast exam normal today  MMG  07-2008 (-) and (-) 10-10 PAP (-) 06-2006, (-) PAP 11-09,  has several (-) PAPs, next PAP in 2012 if so desire   Problem # 3:  HYPOTHYROIDISM (ICD-244.9) labs Her updated medication list for this problem includes:    Synthroid 100 Mcg Tabs (Levothyroxine sodium) ..... Qd  Labs Reviewed: TSH: 1.17 (08/03/2009)    Chol: 167 (09/08/2008)   HDL: 42.2 (09/08/2008)   LDL: 107 (09/08/2008)   TG: 90 (09/08/2008)  Orders: Venipuncture (34917) TLB-TSH (Thyroid Stimulating Hormone) (84443-TSH)  Problem # 4:  HYPERLIPIDEMIA (ICD-272.4) due for  blood work praised for  her healthy life style  Her updated medication list for this problem includes:    Simvastatin 20 Mg Tabs (Simvastatin) .Marland Kitchen... 1/2 qd    Labs Reviewed: SGOT: 23 (09/08/2008)   SGPT: 25 (09/08/2008)   HDL:42.2 (09/08/2008), 44.4 (08/25/2007)  LDL:107 (09/08/2008), DEL (08/25/2007)  Chol:167 (09/08/2008), 189 (08/25/2007)  Trig:90  (09/08/2008), 202 (08/25/2007)  Orders: TLB-Lipid Panel (80061-LIPID) TLB-ALT (SGPT) (84460-ALT) TLB-AST (SGOT) (84450-SGOT)  Problem # 5:  GERD (ICD-530.81) asx , we agreed to change prilosec to as needed only Her updated medication list for this problem includes:    Omeprazole 20 Mg Cpdr (Omeprazole) .Marland Kitchen... 1 daily     Complete Medication List: 1)  Hydrochlorothiazide 25 Mg Tabs (Hydrochlorothiazide) .Marland Kitchen.. 1 by mouth once daily 2)  Synthroid 100 Mcg Tabs (Levothyroxine sodium) .... Qd 3)  Bayer Low Strength 81 Mg Tbec (Aspirin) .... Qd 4)  Simvastatin 20 Mg Tabs (Simvastatin) .... 1/2 qd 5)  Fosamax 70 Mg Tabs (Alendronate sodium) .... Q wk 6)  Otc Acid Reducer  7)  Omeprazole 20 Mg Cpdr (Omeprazole) .Marland Kitchen.. 1 daily  Other Orders: TLB-CBC Platelet - w/Differential (85025-CBCD)  Patient Instructions: 1)  Please schedule a follow-up appointment in 6 months .  Prescriptions: OMEPRAZOLE 20 MG CPDR (OMEPRAZOLE) 1 daily  #90 x 1   Entered by:   Malachi Bonds   Authorized by:   Alda Berthold. Eliyohu Class MD   Signed by:   Malachi Bonds on 01/03/2010   Method used:   Faxed to ...       [facility] (mail-order)             , [place]         Ph: [phone]       Fax: [phone]   RxID:   [id number] FOSAMAX 70 MG  TABS (ALENDRONATE SODIUM) q wk  #12 x 1   Entered by:   Malachi Bonds   Authorized by:   Alda Berthold. Joe Gee MD   Signed by:   Malachi Bonds on 01/03/2010   Method used:   Faxed to ...       [facility] (mail-order)             , [place]         Ph: [phone]       Fax: [phone]   RxID:   [id number] SIMVASTATIN 20 MG  TABS (SIMVASTATIN) 1/2 qd  #45 x 1   Entered by:   Malachi Bonds   Authorized by:   Alda Berthold. Fabiano Ginley MD   Signed by:   Malachi Bonds on 01/03/2010   Method used:   Faxed to ...       [facility] (mail-order)             , [place]         Ph: [phone]       Fax: [phone]   RxID:   [id number] HYDROCHLOROTHIAZIDE 25 MG  TABS  (HYDROCHLOROTHIAZIDE) 1 by mouth once daily  #90 x 2   Entered by:   Malachi Bonds   Authorized by:   Alda Berthold. Shloimy Michalski MD   Signed by:   Malachi Bonds on 01/03/2010   Method used:   Faxed to ...       [facility] (mail-order)             , [place]         Ph: [phone]  Fax: [phone]   RxID:   [id number]

## 2010-11-26 NOTE — Progress Notes (Signed)
Summary: BMD/Labs   Phone Note Call from Patient Call back at Home Phone [phone]   Summary of Call: Patient left message on triage that she was advised to stop Fosamax in Dec, and sometime after that have bone density scan done. Patient would like to know how long after stopping the med should she have the BD scan. Please advise. Initial call taken by: Ernestene Mention CMA,  August 21, 2010 4:09 PM  Follow-up for Phone Call        to stop meds aprox Dec.2011, next scan  in 1 or 2 years (betwen 09-2011 and 09-2012)  Please adivse patient that labs are normal as well.Elna Breslow  August 22, 2010 8:52 AM Follow-up by: Alda Berthold. Cyleigh Massaro MD,  August 22, 2010 8:08 AM  Additional Follow-up for Phone Call Additional follow up Details #1::        Left message to call back.Marland KitchenMarland KitchenMarland KitchenElna Breslow  August 22, 2010 8:52 AM    Additional Follow-up for Phone Call Additional follow up Details #2::    Patient is aware of all results and to stop medicine in december. Patient will call later in in 2012 to sch BMD..Liz Honeycutt  August 22, 2010 8:55 AM

## 2010-11-26 NOTE — Progress Notes (Signed)
Summary: refill fosamax&simvastatin  Phone Note Refill Request Message from:  Fax from Pharmacy on July 03, 2010 2:09 PM  Refills Requested: Medication #1:  FOSAMAX 70 MG  TABS q wk  Medication #2:  SIMVASTATIN 20 MG  TABS 1/2 qd [facility] [phone]  Initial call taken by: Arbie Cookey [place],  July 03, 2010 2:10 PM  Follow-up for Phone Call        Previous rx went to wrong pharmacy number will refax.    Prescriptions: FOSAMAX 70 MG  TABS (ALENDRONATE SODIUM) q wk  #12 x 1   Entered by:   Allyn Kenner CMA   Authorized by:   Alda Berthold. Paz MD   Signed by:   Allyn Kenner CMA on 07/03/2010   Method used:   Reprint   RxID:   [id number] SIMVASTATIN 20 MG  TABS (SIMVASTATIN) 1/2 qd  #45 x 1   Entered by:   Allyn Kenner CMA   Authorized by:   Alda Berthold. Paz MD   Signed by:   Allyn Kenner CMA on 07/03/2010   Method used:   Reprint   RxID:   [id number]

## 2010-11-26 NOTE — Progress Notes (Signed)
Summary: refill on Fosamax  Phone Note Refill Request Message from:  Pharmacy on [facility]  Refills Requested: Medication #1:  FOSAMAX 70 MG  TABS q wk Dr.Paz  refill on Fosamax 63m tab  qty 4   [place] Vanessa [place]  September 18, 2008 3:46 PM      Prescriptions: FOSAMAX 70 MG  TABS (ALENDRONATE SODIUM) q wk  #4 x 12   Entered by:   MCarley Hammed  Authorized by:   JAlda Berthold Paz MD   Signed by:   MCarley Hammedon 09/19/2008   Method used:   Electronically to        [facility] (retail)       [phone]       [place]  [id number]      Ph: [phone]      Fax: [phone]  RxID:   [id number]

## 2010-11-26 NOTE — Progress Notes (Signed)
Summary: refill  Phone Note Refill Request Message from:  Fax from Pharmacy on July 02, 2010 1:24 PM  Refills Requested: Medication #1:  FOSAMAX 70 MG  TABS q wk  Medication #2:  SIMVASTATIN 20 MG  TABS 1/2 qd [facility] - fax [phone]  Initial call taken by: Arbie Cookey [place],  July 02, 2010 1:25 PM    Prescriptions: FOSAMAX 70 MG  TABS (ALENDRONATE SODIUM) q wk  #12 x 1   Entered and Authorized by:   Allyn Kenner CMA   Signed by:   Allyn Kenner CMA on 07/02/2010   Method used:   Faxed to ...       [facility] (mail-order)             , [place]         Ph: [phone]       Fax: [phone]   RxID:   [id number] SIMVASTATIN 20 MG  TABS (SIMVASTATIN) 1/2 qd  #45 x 1   Entered and Authorized by:   Allyn Kenner CMA   Signed by:   Allyn Kenner CMA on 07/02/2010   Method used:   Faxed to ...       [facility] (mail-order)             , [place]         Ph: [phone]       Fax: [phone]   RxID:   [id number]

## 2010-11-26 NOTE — Letter (Signed)
Summary: [facility]: Immunoassay Fecal Occult Blood (iFOB) Order Form  [facility]   [place], [place] [address]   Phone: [phone]  Fax: [phone]      [facility]: Immunoassay Fecal Occult Blood (iFOB) Order Form   January 03, 2010 MRN: [id number]   [name] 1933-12-02   Physicican Name:___________Paz____________  Diagnosis Code:_________v76.49_________________      Angela Clay

## 2010-11-26 NOTE — Progress Notes (Signed)
Summary: due rov  Phone Note Outgoing Call Call back at [facility] Phone [phone] Call back at Work Phone [phone]   Summary of Call: Patient is due rov -- pls call & schedule Dawson Bills  June 21, 2009 2:39 PM     Additional Follow-up for Phone Call Additional follow up Details #2::    Patient stated she has an appt on sept 7,2010 is this ok. Follow-up by: Silva Bandy,  June 21, 2009 3:01 PM

## 2010-11-26 NOTE — Progress Notes (Signed)
Summary: refax refill corrected signature   Phone Note Refill Request Message from:  Fax from Pharmacy on July 04, 2010 10:55 AM  Refills Requested: Medication #1:  SIMVASTATIN 20 MG  TABS 1/2 qd  Medication #2:  FOSAMAX 70 MG  TABS q wk [facility] [phone] - refax - rx was signed by cma  Initial call taken by: Arbie Cookey [place],  July 04, 2010 10:56 AM    Prescriptions: FOSAMAX 70 MG  TABS (ALENDRONATE SODIUM) q wk  #12 x 1   Entered by:   Allyn Kenner CMA   Authorized by:   Alda Berthold. Paz MD   Signed by:   Allyn Kenner CMA on 07/04/2010   Method used:   Printed then faxed to ...       [facility] (mail-order)             , [place]         Ph: [phone]       Fax: [phone]   RxID:   [id number] SIMVASTATIN 20 MG  TABS (SIMVASTATIN) 1/2 qd  #45 x 1   Entered by:   Allyn Kenner CMA   Authorized by:   Alda Berthold. Paz MD   Signed by:   Allyn Kenner CMA on 07/04/2010   Method used:   Printed then faxed to ...       [facility] (mail-order)             , [place]         Ph: [phone]       Fax: [phone]   RxID:   [id number]

## 2010-11-26 NOTE — Progress Notes (Signed)
Summary: vit d results  Phone Note Outgoing Call   Details for Reason: VIT D RESULTS: Vit D low,rec. Vit D at least 600u once daily along w/ Calcium Signed by Alda Berthold. Paz MD on 09/13/2008 at 6:17 AM  Summary of Call: DISCUSSED WITH PT ....Marland KitchenMarland KitchenDawson Clay  September 13, 2008 9:57 AM

## 2010-11-26 NOTE — Progress Notes (Signed)
Summary: refill  Phone Note Refill Request Message from:  Fax from Pharmacy on December 26, 2009 10:44 AM  Refills Requested: Medication #1:  SYNTHROID 100 MCG  TABS qd [facility] fax [phone]   Method Requested: Mail to Pharmacy Next Appointment Scheduled: 01/03/2010 Initial call taken by: Silva Bandy,  December 26, 2009 10:45 AM    Prescriptions: SYNTHROID 100 MCG  TABS (LEVOTHYROXINE SODIUM) qd  #90 x 0   Entered by:   Dawson Bills   Authorized by:   Alda Berthold. Paz MD   Signed by:   Dawson Bills on 12/26/2009   Method used:   Faxed to ...       [facility] (mail-order)             , [place]         Ph: [phone]       Fax: [phone]   RxID:   [id number]

## 2010-11-26 NOTE — Progress Notes (Signed)
Summary: dexa results  Phone Note Outgoing Call Call back at Home Phone [phone] Call back at Work Phone [phone]   Details for Reason: BONE DENSITY RESULTS: advise patient, bone density test is stable compare to previous DEXA continue with Fosamax, vitamin D, HRT, calcium and exercise recheck a bone density test in two years Signed by [facility] E. Paz MD on 10/10/2008 at 6:27 PM  Summary of Call: Left message on machine to return call.......Marland KitchenDawson Bills  October 11, 2008 4:11 PM discussed with patient .Marland KitchenMarland KitchenDawson Bills  October 12, 2008 12:02 PM

## 2010-11-26 NOTE — Letter (Signed)
Summary: Results Follow up Letter  [facility]   [place], [place] [address]   Phone: [phone]  Fax: [phone]    09/11/2008 MRN: [id number]  [facility] [place], [place]  [address]  Dear Angela Clay,  The following are the results of your recent test(s):  Test         Result    Pap Smear:        Normal _____  Not Normal _____ Comments: ______________________________________________________ Cholesterol: LDL(Bad cholesterol):         Your goal is less than:         HDL (Good cholesterol):       Your goal is more than: Comments:  ______________________________________________________ Mammogram:        Normal _____  Not Normal _____ Comments:  ___________________________________________________________________ Hemoccult:        Normal __x_  Not normal _______ Comments:    _____all stool cards were negative for blood ________________________________________________________________ Other Tests:    We routinely do not discuss normal results over the telephone.  If you desire a copy of the results, or you have any questions about this information we can discuss them at your next office visit.   Sincerely,

## 2010-11-26 NOTE — Letter (Signed)
Summary: Results Follow up Letter  [facility]   [place], [place] [address]   Phone: [phone]  Fax: [phone]    09/12/2008 MRN: [id number]  [facility] [place], [place]  [address]  Dear Ms. Ennen,  The following are the results of your recent test(s):  Test         Result    Pap Smear:        Normal __x___  Not Normal _____ Comments: ______________next pap due in 1 year ________________________________________ Cholesterol: LDL(Bad cholesterol):         Your goal is less than:         HDL (Good cholesterol):       Your goal is more than: Comments:  ______________________________________________________ Mammogram:        Normal _____  Not Normal _____ Comments:  ___________________________________________________________________ Hemoccult:        Normal _____  Not normal _______ Comments:    _____________________________________________________________________ Other Tests:  SEE ATTACHED LABS AND COMMENTS   We routinely do not discuss normal results over the telephone.  If you desire a copy of the results, or you have any questions about this information we can discuss them at your next office visit.   Sincerely,

## 2010-11-26 NOTE — Assessment & Plan Note (Signed)
Summary: yearly check/cbs   Vital Signs:  Patient Profile:   77 Years Old Female Height:     64 inches Weight:      169 pounds Pulse rate:   68 / minute Pulse rhythm:   regular BP sitting:   110 / 70  (left arm) Cuff size:   large  Vitals Entered By: Dawson Bills (August 30, 2008 1:09 PM)                 Chief Complaint:  yearly/pap; not fasting; RF ON PREMARIN.  History of Present Illness: yearly/pap; not fasting; RF ON        GERD-- symptoms well control       Hyperlipidemia-- on meds, no s/e        Osteopenia-- on meds, no s/e        Hypertension--no ambulatory BPs recently        Hypothyroidism-- good compliance     Updated Prior Medication List: HYDROCHLOROTHIAZIDE 25 MG  TABS (HYDROCHLOROTHIAZIDE) 1 by mouth once daily SYNTHROID 100 MCG  TABS (LEVOTHYROXINE SODIUM) qd BAYER LOW STRENGTH 81 MG  TBEC (ASPIRIN) qd SIMVASTATIN 20 MG  TABS (SIMVASTATIN) 1/2 qd FOSAMAX 70 MG  TABS (ALENDRONATE SODIUM) q wk * OTC ACID REDUCER  PREMARIN 0.625 MG/GM  CREA (ESTROGENS, CONJUGATED) 1gr  vaginally daily x 4 weeks, the 3 times a week only PRILOSEC OTC 20 MG  TBEC (OMEPRAZOLE MAGNESIUM) 1 by mouth once daily  before breakfast  Current Allergies (reviewed today): ! CODEINE  Past Medical History:    Reviewed history from 01/11/2008 and no changes required:       GERD, EGD for dysphagia 2000 aprox (-)       Hyperlipidemia       Osteopenia, DEXA 2007       Hypertension       LYMPHOMA NECK, MLIG, UNSPECIFIED SITE        Hypothyroidism  Past Surgical History:    Reviewed history from 01/11/2008 and no changes required:       Tubal ligation (1972)       Cholecystectomy (02/24/2006)       Appendectomy       THYROIDECTOMY, HX OF (ICD-V45.79)   Family History:    Reviewed history from 08/25/2007 and no changes required:       DM--no       colon ca--no       breast ca--no  Social History:    Reviewed history from 08/25/2007 and no changes required:       Retired      Married       3 kids, 8 GK   Risk Factors: Tobacco use:  never Alcohol use:  no  Mammogram History:    Date of Last Mammogram:  08/11/2007  PAP Smear History:    Date of Last PAP Smear:  07/28/2006   Review of Systems  CV      Denies chest pain or discomfort and swelling of feet.  Resp      Denies shortness of breath.  GI      Denies bloody stools, diarrhea, and nausea.  GU      no vag bleed except when she has intercourse, never got cream filled: redo Rx  Psych      Denies anxiety.   Physical Exam  General:     alert and well-developed.   Neck:     no thyromegaly and normal carotid upstroke.   Breasts:  No mass, nodules, thickening, tenderness, bulging, retraction, inflamation, nipple discharge or skin changes noted.  No axilary LADs Lungs:     normal respiratory effort, no intercostal retractions, no accessory muscle use, and normal breath sounds.   Heart:     normal rate, regular rhythm, and no murmur.   Abdomen:     soft, non-tender, no distention, and no masses.   Rectal:     No external abnormalities noted. Normal sphincter tone. No rectal masses or tenderness. Hem (-)  Genitalia:     no external lesions, no vaginal discharge, and no friaility or hemorrhage.  Membranes slightly  pale and dry Bimanual--no mass Extremities:     no pretibial edema bilaterally  Psych:     Oriented X3, memory intact for recent and remote, normally interactive, good eye contact, not anxious appearing, and not depressed appearing.      Impression & Recommendations:  Problem # 1:  HYPOTHYROIDISM (ICD-244.9) labs Her updated medication list for this problem includes:    Synthroid 100 Mcg Tabs (Levothyroxine sodium) ..... Qd  Labs Reviewed: TSH: 2.30 (01/11/2008)    Chol: 189 (08/25/2007)   HDL: 44.4 (08/25/2007)   LDL: DEL (08/25/2007)   TG: 202 (08/25/2007)   Problem # 2:  HEALTH SCREENING (ICD-V70.0) never Cscope , (-) hemocults 11-08 risk benefits of  Cscope discussed, Hemocult provided, Call if like Cscope Td  11-09 Pneumina shot 2008 printed material provided regards shingles shot  MMG  07-2008 (-)  PAP (-) 06-2006, repeat today; has several (-) PAPs, next PAP in 3 years if so desire  Problem # 3:  HYPERLIPIDEMIA (ICD-272.4) labs Her updated medication list for this problem includes:    Simvastatin 20 Mg Tabs (Simvastatin) .Marland Kitchen... 1/2 qd  Labs Reviewed: Chol: 189 (08/25/2007)   HDL: 44.4 (08/25/2007)   LDL: DEL (08/25/2007)   TG: 202 (08/25/2007) SGOT: 23 (08/25/2007)   SGPT: 30 (08/25/2007)   Problem # 4:  OSTEOPENIA (ICD-733.90) needs a DEXA Her updated medication list for this problem includes:    Fosamax 70 Mg Tabs (Alendronate sodium) ..... Q wk   Orders: Radiology Referral (Radiology)   Problem # 5:  HYPERTENSION (ICD-401.9) at goal  Her updated medication list for this problem includes:    Hydrochlorothiazide 25 Mg Tabs (Hydrochlorothiazide) .Marland Kitchen... 1 by mouth once daily  BP today: 110/70 Prior BP: 120/80 (01/11/2008)  Labs Reviewed: Creat: 1.1 (08/25/2007) Chol: 189 (08/25/2007)   HDL: 44.4 (08/25/2007)   LDL: DEL (08/25/2007)   TG: 202 (08/25/2007)   Complete Medication List: 1)  Hydrochlorothiazide 25 Mg Tabs (Hydrochlorothiazide) .Marland Kitchen.. 1 by mouth once daily 2)  Synthroid 100 Mcg Tabs (Levothyroxine sodium) .... Qd 3)  Bayer Low Strength 81 Mg Tbec (Aspirin) .... Qd 4)  Simvastatin 20 Mg Tabs (Simvastatin) .... 1/2 qd 5)  Fosamax 70 Mg Tabs (Alendronate sodium) .... Q wk 6)  Otc Acid Reducer  7)  Premarin 0.625 Mg/gm Crea (Estrogens, conjugated) .Marland Kitchen.. 1gr  vaginally daily x 4 weeks, the 3 times a week only 8)  Prilosec Otc 20 Mg Tbec (Omeprazole magnesium) .Marland Kitchen.. 1 by mouth once daily  before breakfast  Other Orders: Tetanus Toxoid w/Dx (01779) Admin 1st Vaccine [phone])   Patient Instructions: 1)  Came back fasting:  2)  Vit D level, FLP CBC AST ALT BMP TSH 3)  (use all Dx in the chart) 4)  Please  schedule a follow-up appointment in 6 months.   Prescriptions: PREMARIN 0.625 MG/GM  CREA (ESTROGENS, CONJUGATED) 1gr  vaginally daily  x 4 weeks, the 3 times a week only  #1 x 12   Entered and Authorized by:   [employer] E. Haruna Rohlfs MD   Signed by:   Alda Berthold. Peola Joynt MD on 08/30/2008   Method used:   Print then Give to Patient   RxID:   [id number]  ]  Tetanus/Td Vaccine    Vaccine Type: Td    Site: right deltoid    Dose: 0.5 ml    Route: IM    Given by: Dawson Bills    Exp. Date: 01/09/2010    Lot #: [id number]

## 2010-11-26 NOTE — Letter (Signed)
Summary: Results Follow up Letter  [facility]   [place], [place] [address]   Phone: [phone]  Fax: [phone]    09/06/2007 MRN: [id number]  [facility] [place], [place]  [address]  Dear Ms. Outman,  The following are the results of your recent test(s):  Test         Result    Pap Smear:        Normal _____  Not Normal _____ Comments: ______________________________________________________ Cholesterol: LDL(Bad cholesterol):         Your goal is less than:         HDL (Good cholesterol):       Your goal is more than: Comments:  ______________________________________________________ Mammogram:        Normal _____  Not Normal _____ Comments:  ___________________________________________________________________ Hemoccult:        Normal _x____  Not normal _______ Comments:    __all stool cards were normal ___________________________________________________________________ Other Tests:    We routinely do not discuss normal results over the telephone.  If you desire a copy of the results, or you have any questions about this information we can discuss them at your next office visit.   Sincerely,

## 2010-11-26 NOTE — Progress Notes (Signed)
Summary: refill  Phone Note Refill Request Message from:  Fax from Pharmacy on September 17, 2010 4:47 PM  Refills Requested: Medication #1:  SYNTHROID 100 MCG  TABS qd  Medication #2:  HYDROCHLOROTHIAZIDE 25 MG  TABS 1 by mouth once daily [facility] - fax [phone] Angela Clay [phone]  Initial call taken by: Arbie [place],  September 17, 2010 4:48 PM    Prescriptions: SYNTHROID 100 MCG  TABS (LEVOTHYROXINE SODIUM) qd  #90 x 1   Entered by:   [place] by:   Alda Berthold. Paz MD   Signed by:   Allyn Kenner CMA on 09/17/2010   Method used:   Faxed to ...       [facility] (mail-order)             , [place]         Ph: [phone]       Fax: [phone]   RxID:   [id number] HYDROCHLOROTHIAZIDE 25 MG  TABS (HYDROCHLOROTHIAZIDE) 1 by mouth once daily  #90 x 1   Entered by:   Allyn Kenner CMA   Authorized by:   Alda Berthold. Paz MD   Signed by:   Allyn Kenner CMA on 09/17/2010   Method used:   Faxed to ...       [facility] (mail-order)             , [place]         Ph: [phone]       Fax: [phone]   RxID:   [id number]

## 2010-12-23 ENCOUNTER — Encounter: Payer: Self-pay | Admitting: Internal Medicine

## 2010-12-23 ENCOUNTER — Ambulatory Visit (INDEPENDENT_AMBULATORY_CARE_PROVIDER_SITE_OTHER): Payer: Medicare PPO | Admitting: Internal Medicine

## 2010-12-23 DIAGNOSIS — J069 Acute upper respiratory infection, unspecified: Secondary | ICD-10-CM

## 2011-01-02 NOTE — Assessment & Plan Note (Signed)
Summary: cold/kn   Vital Signs:  Patient profile:   77 year old female Height:      64 inches Weight:      164.50 pounds BMI:     28.34 Temp:     98.2 degrees F oral Pulse rate:   70 / minute Pulse rhythm:   regular BP sitting:   128 / 82  (left arm) Cuff size:   regular  Vitals Entered By: Rico (December 23, 2010 10:23 AM) CC: "cold" x 3 weeks Comments mostly in head minor cough taking tyelnol cold [facility]    History of Present Illness: "cold" x 3 weeks  sinus congestion , lack of energy , mild cough  Taking over-the-counter cold medicines.     Current Medications (verified): 1)  Hydrochlorothiazide 25 Mg  Tabs (Hydrochlorothiazide) .Marland Kitchen.. 1 By Mouth Once Daily 2)  Synthroid 100 Mcg  Tabs (Levothyroxine Sodium) .... Qd 3)  Bayer Low Strength 81 Mg  Tbec (Aspirin) .... Qd 4)  Simvastatin 20 Mg  Tabs (Simvastatin) .... 1/2 Qd 5)  Omeprazole 20 Mg Cpdr (Omeprazole) .Marland Kitchen.. 1 Daily 6)  Calcium-Vitamin D 250-125 Mg-Unit Tabs (Calcium Carbonate-Vitamin D) 7)  Tylenol Extra Strength 500 Mg Tabs (Acetaminophen) .Marland Kitchen.. 1 By Mouth Daily  Allergies (verified): 1)  ! Codeine  Past History:  Past Medical History: Reviewed history from 08/20/2010 and no changes required. GERD, EGD for dysphagia 2000 aprox (-) Hyperlipidemia Osteopenia  Hypertension LYMPHOMA NECK, MLIG, UNSPECIFIED SITE dx 1970s  Hypothyroidism OA, severe at neck  Past Surgical History: Reviewed history from 04/09/2009 and no changes required. Tubal ligation (1972) Cholecystectomy (02/24/2006) Appendectomy THYROIDECTOMY   Social History: Reviewed history from 01/03/2010 and no changes required. Retired, worked at a [employer]  Married 3 kids, 31 GK [facility]-- improved  tobacco-- never ETOH--no  Review of Systems General:  Denies fever. Resp:  Denies wheezing; cough sputum, greenish, x the last 2 days only milkd SOB?. GI:  Denies diarrhea,  nausea, and vomiting. MS:  Denies muscle aches.  Physical Exam  General:  alert and well-developed.   Head:   face symmetric, nontender Ears:  R ear normal and L ear normal.   Nose:   slightly congested Mouth:   no redness or discharge Lungs:  normal respiratory effort, no intercostal retractions, no accessory muscle use, and normal breath sounds.   Heart:  normal rate, regular rhythm, and no murmur.     Impression & Recommendations:  Problem # 1:  URI (ICD-465.9)  see  instructions Her updated medication list for this problem includes:    Bayer Low Strength 81 Mg Tbec (Aspirin) ..... Qd    Tylenol Extra Strength 500 Mg Tabs (Acetaminophen) .Marland Kitchen... 1 by mouth daily  Complete Medication List: 1)  Hydrochlorothiazide 25 Mg Tabs (Hydrochlorothiazide) .Marland Kitchen.. 1 by mouth once daily 2)  Synthroid 100 Mcg Tabs (Levothyroxine sodium) .... Qd 3)  Bayer Low Strength 81 Mg Tbec (Aspirin) .... Qd 4)  Simvastatin 20 Mg Tabs (Simvastatin) .... 1/2 qd 5)  Omeprazole 20 Mg Cpdr (Omeprazole) .Marland Kitchen.. 1 daily 6)  Calcium-vitamin D 250-125 Mg-unit Tabs (Calcium carbonate-vitamin d) 7)  Tylenol Extra Strength 500 Mg Tabs (Acetaminophen) .Marland Kitchen.. 1 by mouth daily 8)  Astepro 0.15 % Soln (Azelastine hcl) .... 2 sprays on each side of th nose daily 9)  Amoxicillin 500 Mg Tabs (Amoxicillin) .... 2 by mouth two times a day x 1 week  Patient Instructions: 1)  rest, fluids, tylenol  2)  OTC cough meds (Mucinex DM) 3)  astepro 2 sprays once a day 4)  if not better in few days, amoxicillin 5)  call if symptoms severe or no better in 2 weeks  Prescriptions: AMOXICILLIN 500 MG TABS (AMOXICILLIN) 2 by mouth two times a day x 1 week  #28 x 0   Entered and Authorized by:   Alda Berthold. Melea Prezioso MD   Signed by:   Alda Berthold. Canisha Issac MD on 12/23/2010   Method used:   Print then Give to Patient   RxID:   [id number] ASTEPRO 0.15 % SOLN (AZELASTINE HCL) 2 sprays on each side of th nose daily  #1 x 1   Entered and Authorized by:   Alda Berthold. Idamae Coccia MD   Signed by:   Alda Berthold. Mareli Antunes MD on 12/23/2010   Method used:   Print then Give to Patient   RxID:   [id number]    Orders Added: 1)  Est. Patient Level III [89791]

## 2011-02-19 ENCOUNTER — Encounter: Payer: Self-pay | Admitting: Internal Medicine

## 2011-02-19 ENCOUNTER — Ambulatory Visit (INDEPENDENT_AMBULATORY_CARE_PROVIDER_SITE_OTHER): Payer: Medicare PPO | Admitting: Internal Medicine

## 2011-02-19 VITALS — BP 128/82 | HR 63 | Ht 64.75 in | Wt 164.2 lb

## 2011-02-19 DIAGNOSIS — E785 Hyperlipidemia, unspecified: Secondary | ICD-10-CM

## 2011-02-19 DIAGNOSIS — I779 Disorder of arteries and arterioles, unspecified: Secondary | ICD-10-CM | POA: Insufficient documentation

## 2011-02-19 DIAGNOSIS — Z0000 Encounter for general adult medical examination without abnormal findings: Secondary | ICD-10-CM

## 2011-02-19 DIAGNOSIS — I10 Essential (primary) hypertension: Secondary | ICD-10-CM

## 2011-02-19 DIAGNOSIS — M899 Disorder of bone, unspecified: Secondary | ICD-10-CM

## 2011-02-19 DIAGNOSIS — E039 Hypothyroidism, unspecified: Secondary | ICD-10-CM

## 2011-02-19 LAB — CBC WITH DIFFERENTIAL/PLATELET
Basophils Absolute: 0 10*3/uL (ref 0.0–0.1)
Basophils Relative: 0.3 % (ref 0.0–3.0)
Eosinophils Absolute: 0.3 10*3/uL (ref 0.0–0.7)
Eosinophils Relative: 5.8 % — ABNORMAL HIGH (ref 0.0–5.0)
HCT: 39.8 % (ref 36.0–46.0)
Hemoglobin: 13.7 g/dL (ref 12.0–15.0)
Lymphocytes Relative: 22.7 % (ref 12.0–46.0)
Lymphs Abs: 1.3 10*3/uL (ref 0.7–4.0)
MCHC: 34.5 g/dL (ref 30.0–36.0)
MCV: 89.8 fl (ref 78.0–100.0)
Monocytes Absolute: 0.5 10*3/uL (ref 0.1–1.0)
Monocytes Relative: 8.4 % (ref 3.0–12.0)
Neutro Abs: 3.6 10*3/uL (ref 1.4–7.7)
Neutrophils Relative %: 62.8 % (ref 43.0–77.0)
Platelets: 183 10*3/uL (ref 150.0–400.0)
RBC: 4.44 Mil/uL (ref 3.87–5.11)
RDW: 14.1 % (ref 11.5–14.6)
WBC: 5.7 10*3/uL (ref 4.5–10.5)

## 2011-02-19 LAB — BASIC METABOLIC PANEL
BUN: 14 mg/dL (ref 6–23)
CO2: 33 mEq/L — ABNORMAL HIGH (ref 19–32)
Calcium: 9.7 mg/dL (ref 8.4–10.5)
Chloride: 104 mEq/L (ref 96–112)
Creatinine, Ser: 1 mg/dL (ref 0.4–1.2)
GFR: 57.41 mL/min — ABNORMAL LOW (ref >60.00)
Glucose, Bld: 81 mg/dL (ref 70–99)
Potassium: 4.9 mEq/L (ref 3.5–5.1)
Sodium: 143 mEq/L (ref 135–145)

## 2011-02-19 LAB — LIPID PANEL
Cholesterol: 193 mg/dL (ref 0–200)
HDL: 59.5 mg/dL (ref >39.00)
LDL Cholesterol: 95 mg/dL (ref 0–99)
Total CHOL/HDL Ratio: 3
Triglycerides: 194 mg/dL — ABNORMAL HIGH (ref 0.0–149.0)
VLDL: 38.8 mg/dL (ref 0.0–40.0)

## 2011-02-19 LAB — AST: AST: 18 U/L (ref 0–37)

## 2011-02-19 LAB — ALT: ALT: 20 U/L (ref 0–35)

## 2011-02-19 LAB — TSH: TSH: 4.83 u[IU]/mL (ref 0.35–5.50)

## 2011-02-19 MED ORDER — ZOSTER VACCINE LIVE 19400 UNT/0.65ML ~~LOC~~ SOLR: 0.6500 mL | Freq: Once | SUBCUTANEOUS | Status: AC

## 2011-02-19 NOTE — Assessment & Plan Note (Signed)
Good compliance , labs

## 2011-02-19 NOTE — Assessment & Plan Note (Signed)
Due for labs

## 2011-02-19 NOTE — Assessment & Plan Note (Signed)
DEXA  08/2008 showed  stable osteopenia she is taking Fosamax since approximately 2007, takes calcium -vitamin D and exercise Plan:  d/c fosamax  recheck a bone density test in 1 or 2  years

## 2011-02-19 NOTE — Assessment & Plan Note (Signed)
Question of drecreased R pulse Check a u/s

## 2011-02-19 NOTE — Assessment & Plan Note (Signed)
Well controlled 

## 2011-02-19 NOTE — Assessment & Plan Note (Addendum)
Td  11-09 Pneumina shot 2008 Rx for  shingles shot provided   never Cscope , (-) hemocults 11-08, 11-09, 3-11 DRE normal  risk benefits of Cscope discussed, iFOB provided, Call if like Cscope   breast exam   today : breast tissue slt more dense at R breast, recheck in 6 months MMG   10-11, neg  PAP (-) 06-2006, (-) PAP 11-09,  has several (-) PAPsPAP sent today Had few hypochromic areas in the labia L>R: refer to gyn, BX?  Encouraged to cont healthy life style

## 2011-02-19 NOTE — Progress Notes (Signed)
Subjective:    Patient ID: Angela Clay, female    DOB: 1933-12-31, 77 y.o.   MRN: [id number]  HPI  Here for Medicare AWV:  1. Risk factors based on Past M, S, F history: reviewed  2. Physical Activities: active, home chores, active at church, silver snickers x3/week 3. Depression/mood:  No problems noted or reported  4. Hearing:  Very good, no problems w/ normal conversation 5. ADL's:  independent 6. Fall Risk: no recent falls, average/low risk  7. Home Safety: does feelsafe at home  8. Height, weight, &visual acuity: see VS, uses glasses for driving and reading   9. Counseling: provided 10. Labs ordered based on risk factors: yes  11. Referral Coordination: if needed  12.  Care Plan, see a/p  13.   Cognitive Assessment:memory and cognition and motor skills seem appropiate  In addition, we discussed the following: C/o vag dryness x years, premarin very costly, has intercourse seldom but c/o bleeding w/ sexual activity: rec either OTC lubricants or premarin 3 times a week HTN-- good med compliance , amb BP ~ 102, no sx of low BP Hypothyroid-- good med compliance  Osteopenia-- good compliance w/ ca and vit D  Past Medical History  Diagnosis Date  . GERD (gastroesophageal reflux disease)     EGD for dysphagia 2000 aprox (-)  . Hyperlipidemia   . Osteopenia   . Hypertension   . Lymphoma     neck,mlig,unspecified site dx 1970s  . Hypothyroidism   . OA (osteoarthritis)     severe at neck   Past Surgical History  Procedure Date  . Tubal ligation 1972  . Cholecystectomy 02/24/06  . Appendectomy   . Thyroidectomy    Family History: DM--no colon ca--no breast ca--no strike--M CAD-- F DVT -- PE-- B  Social History: Retired, worked at a [employer] office  Married 3 kids, 28 GK [facility] the [facility] to [facility] diet-- healthy tobacco-- never ETOH--no  Review of Systems  Constitutional: Negative for fever, fatigue and unexpected weight change.  Respiratory: Negative for  cough, shortness of breath and wheezing.   Cardiovascular: Negative for chest pain and leg swelling.  Gastrointestinal: Negative for abdominal pain and blood in stool.  Genitourinary: Negative for dysuria, urgency and hematuria.       No vag bleed except w/ intercourse . Does SBE, normal       Objective:   Physical Exam  Constitutional: She is oriented to person, place, and time. She appears well-developed and well-nourished. No distress.  HENT:  Head: Normocephalic and atraumatic.  Neck: No thyromegaly present.  Cardiovascular: Normal rate, regular rhythm and normal heart sounds.   No murmur heard. Pulmonary/Chest: Effort normal and breath sounds normal. No respiratory distress. She has no wheezes. She has no rales.  Abdominal: Soft. Bowel sounds are normal. She exhibits no distension. There is no tenderness. There is no rebound and no guarding.  Genitourinary: Guaiac negative stool.       External examination, she has some dryness and atrophic lab yet. She has some white/hypochromic lesions. Internally, there is no lesions. Cervix seems normal, she did bleed a little with the Pap smear. Breast exam: Normal bilaterally, and the right breast, upper external quadrant the breast tissue is more dense but no actual mass or abnormal skin. No axillae lymphadenopathy.  Neurological: She is alert and oriented to person, place, and time.  Skin: Skin is warm and dry. She is not diaphoretic.  Psychiatric: She has a normal mood and affect.  Her behavior is normal. Judgment and thought content normal.          Assessment & Plan:

## 2011-02-20 ENCOUNTER — Other Ambulatory Visit (HOSPITAL_COMMUNITY)
Admission: RE | Admit: 2011-02-20 | Discharge: 2011-02-20 | Disposition: A | Discharge status: Home / Self Care | Payer: Medicare PPO | Source: Ambulatory Visit | Attending: Internal Medicine | Admitting: Internal Medicine

## 2011-02-20 DIAGNOSIS — Z01419 Encounter for gynecological examination (general) (routine) without abnormal findings: Secondary | ICD-10-CM | POA: Insufficient documentation

## 2011-02-24 ENCOUNTER — Telehealth: Payer: Self-pay | Admitting: *Deleted

## 2011-02-24 NOTE — Telephone Encounter (Signed)
Message copied by Allyn Kenner on Mon Feb 24, 2011 11:19 AM ------      Message from: [name]      Created: Sun Feb 23, 2011 10:25 AM       Advise patient:      Cholesterol ok, TG slt more elevated than before, watch diet.      PAP normal.      TSH ok but I like to recheck it in 2 months  (please arrange, labs only)      Other labs WNL, good results

## 2011-02-24 NOTE — Telephone Encounter (Signed)
Message left for patient to return my call.  

## 2011-02-24 NOTE — Telephone Encounter (Signed)
I spoke w/ pt she is aware.

## 2011-02-28 ENCOUNTER — Encounter (INDEPENDENT_AMBULATORY_CARE_PROVIDER_SITE_OTHER): Payer: Medicare PPO | Admitting: *Deleted

## 2011-02-28 DIAGNOSIS — R0989 Other specified symptoms and signs involving the circulatory and respiratory systems: Secondary | ICD-10-CM

## 2011-02-28 DIAGNOSIS — I6529 Occlusion and stenosis of unspecified carotid artery: Secondary | ICD-10-CM

## 2011-03-03 ENCOUNTER — Encounter: Payer: Self-pay | Admitting: Internal Medicine

## 2011-03-03 ENCOUNTER — Other Ambulatory Visit: Payer: Self-pay | Admitting: Obstetrics & Gynecology

## 2011-03-05 ENCOUNTER — Telehealth: Payer: Self-pay | Admitting: *Deleted

## 2011-03-05 NOTE — Telephone Encounter (Signed)
Spoke w/ pt aware of results.

## 2011-03-05 NOTE — Telephone Encounter (Signed)
Message copied by Allyn Kenner on Wed Mar 05, 2011 10:26 AM ------      Message from: [name]      Created: Wed Mar 05, 2011 10:22 AM       Carotid ultrasound: Left 60-79%, right 0-39%.      She is asymptomatic.      Please advice patient, she does have cholesterol buildup in the arteries. The left side is close to 80%.      Recommend to repeat the ultrasound in 6 months.

## 2011-03-05 NOTE — Telephone Encounter (Signed)
Message left for patient to return my call.  

## 2011-03-10 ENCOUNTER — Other Ambulatory Visit: Payer: Self-pay | Admitting: *Deleted

## 2011-03-10 MED ORDER — LEVOTHYROXINE SODIUM 100 MCG PO TABS: 100.0000 ug | ORAL_TABLET | Freq: Every day | ORAL | Status: DC

## 2011-03-10 MED ORDER — HYDROCHLOROTHIAZIDE 25 MG PO TABS: 25.0000 mg | ORAL_TABLET | Freq: Every day | ORAL | Status: DC

## 2011-03-14 NOTE — Op Note (Signed)
[place]  Patient:    Angela Clay, Angela Clay                      MRN: [id number] Proc. Date: 03/12/00 Adm. Date:  [id number] Disc. Date: [id number] Attending:  Myrtie Cruise CC:         Orson Ape. Rise Patience, M.D.             Joycelyn Man, M.D. [facility]                           Operative Report  PREOPERATIVE DIAGNOSIS:  Chronic cholecystitis.  POSTOPERATIVE DIAGNOSIS:  Chronic cholecystitis.  OPERATION PERFORMED:  Laparoscopic cholecystectomy with cholangiogram.  SURGEON:  Orson Ape. Rise Patience, M.D.  ASSISTANT:  Lew Dawes. Rosana Hoes, M.D.  ANESTHESIA:  General.  INDICATIONS FOR PROCEDURE:  The patient is a 77 year old Caucasian female who I did a thyroidectomy for thyroid lymphoma approximately 20 years ago.  She has now presented with symptomatic gallstones and has been having intermittent episodes over several years.  Her liver function studies are normal and she has not actually had another episode since I saw her in the office approximately three weeks ago.  Preoperatively, she was given 3 gm of Unasyn and PAS stockings and taken to the operating suite.  DESCRIPTION OF PROCEDURE:  After induction of general anesthesia, the abdomen was prepped with Betadine surgical scrub and solution and draped in a sterile manner.  The patient had had a lower midline incision.  On the lymphoma work-up I think they had questioned an enlarged lymph node in the iliac areas. This had been negative but I elected to make the incision just below the umbilicus down through the subcutaneous tissues.  Fascia identified.  This picked up between hemostats and a small opening made and then the fascia and peritoneum carefully opened with a Claiborne Billings.  The traction suture was placed and Hasson cannula introduced.  There were some adhesions right in this area but you could see this sort of free space in the upper right abdomen and the camera was inserted into that  area.  Next, the patient was placed sort of head up and the subcostal incision made under direct vision after anesthetizing the fascia with a 10 mm trocar and the two lateral 5 mm trocars in the appropriate placement with Dr. Rosana Hoes after anesthetizing.  The gallbladder did have multiple adhesions around it but fortunately was not acutely inflamed.  The gallbladder was retracted upward and outward.  The adhesions were carefully taken down with sharp and blunt dissection using the electrocautery using electrocautery and the dissecting scissors.  We freed up the edge of the proximal portion of the gallbladder, the peritoneum opened and the little vessel that I first thought was the cystic artery was doubly clipped proximally, singly distally and then sort of nicked and then I realized that this was a small cystic duct and not an artery.  The two proximal clips were removed.  The taut catheter was introduced and placed into a catheter and x-ray obtained with good filling into the intrahepatic and extrahepatic radicles.  There was flow into the duodenum. I then removed the catheter triply clipped the little cystic duct, completely divided it.  The artery was right behind it which was doubly clipped proximally, singly distally and divided and then the gallbladder was dissected free from its bed in the fossa. Good hemostasis was obtained.  We placed  the gallbladder in the endocatch bag and then brought it up through the fascia at the umbilicus with a camera in the upper 10 mm port.  The gallbladder neck was opened.  The multiple small predominantly black and cholesterol sort of a mixture were removed and then the gallbladder could be brought on through the fascia within the bag.  Next, we sutured the fascia with two figure-of-eights of 0 Vicryl, anesthetized it with Marcaine and reinspected the area and there was good hemostasis and a good closure.  The two lateral 5 mm trocars were withdrawn after  removing the little irrigating fluid.  There had been a little bit of bleeding where the needle for the cholangiocath had been inserted but there was no bleeding at this time.  The carbon dioxide released and the upper 10 mm trocar withdrawn. The subcutaneous tissue was closed with 4-0 Vicryl, benzoin and Steri-Strips on the skin.  The patient tolerated the procedure nicely, was extubated and taken to the recovery room in stable postoperative condition. DD:  03/12/00 TD:  03/17/00 Job: [id number]

## 2011-03-14 NOTE — Procedures (Signed)
[place]  Patient:    Angela Clay, PARDO                      MRN: [id number] Proc. Date: 02/21/00 Adm. Date:  [id number] Attending:  Vincent Peyer CC:         Joycelyn Man, M.D. [facility]             Orson Ape. Rise Patience, M.D.                           Procedure Report  PROCEDURE:  Upper endoscopy.  ENDOSCOPIST:  Lowella Bandy. Olevia Perches, M.D. [facility]  INDICATIONS:  This 77 year old white female is scheduled for laparoscopic cholecystectomy by Dr. Rise Patience on May 17.  She has had intermittent solid food dysphagia for the past several months.  She had one episode of regurgitation of the food, especially corn bread and meat.  She has been put on Prilosec 20 mg a day with complete resolution of the dysphagia. She has not had any episodes in the last several week.  She is undergoing upper endoscopy because of suspicion for mild esophageal stricture.  The patient has a history of radiation to the thyroid because of history of thyroid lymphoma.  The endoscopy is being done to rule out possibility of proximal esophageal stricture, fibrosis related to previous radiation.  ENDOSCOPE:  Olympus single-channel endoscope.  SEDATION:  Versed 8 mg IV.  FINDINGS:  Esophagus.  DESCRIPTION OF PROCEDURE:  The Olympus single-channel endoscope was passed through the posterior pharynx into the esophagus.  The patient was monitored by pulse oximeter.  Her oxygen saturations were normal.  The vallecula, peripheral sinuses, and glottis were unremarkable.  The upper esophageal sphincter area was normal.  Proximal esophageal mucosa showed no significant abnormality, although there was quite a bit of fibrous tissue.  I could not approach any clinically significant stricture in this area, although the diameter of the scope was only 9 mm and it would have missed mild esophageal web or ring.  Mid and distal esophageal mucosa was normal.  There was no significant hiatal  hernia.  Stomach: The stomach was insufflated with air and showed normal gastric folds, part of the stomach and gastric antrum.  Retroflexion of endoscope revealed normal fundus and cardia.  Duodenum:  The duodenal bulb and descending duodenum were normal.  Maloney dilator 52-French passed blindly through the esophagus without difficulty. There was no blood on the dilator.  The patient tolerated the procedure well.  IMPRESSION: 1. No evidence of clinically significant stricture status post passage of    52-French Maloney dilator. 2. Status post CLOtest.  PLAN:  The patient is to continue on antireflux measures, acid suppressing agents, and keep her scheduled appointment with Dr. Rise Patience for cholecystectomy.DD:  02/21/00 TD:  02/22/00 Job: [id number]

## 2011-03-19 ENCOUNTER — Other Ambulatory Visit: Payer: Medicare PPO

## 2011-03-19 ENCOUNTER — Other Ambulatory Visit (INDEPENDENT_AMBULATORY_CARE_PROVIDER_SITE_OTHER): Payer: Medicare PPO | Admitting: Internal Medicine

## 2011-03-19 DIAGNOSIS — Z1211 Encounter for screening for malignant neoplasm of colon: Secondary | ICD-10-CM

## 2011-03-19 LAB — FECAL OCCULT BLOOD, IMMUNOCHEMICAL: Fecal Occult Bld: NEGATIVE

## 2011-03-21 ENCOUNTER — Encounter: Payer: Self-pay | Admitting: *Deleted

## 2011-05-05 ENCOUNTER — Other Ambulatory Visit: Payer: Self-pay | Admitting: Internal Medicine

## 2011-05-05 DIAGNOSIS — E039 Hypothyroidism, unspecified: Secondary | ICD-10-CM

## 2011-05-06 ENCOUNTER — Other Ambulatory Visit (INDEPENDENT_AMBULATORY_CARE_PROVIDER_SITE_OTHER): Payer: Medicare PPO

## 2011-05-06 DIAGNOSIS — E039 Hypothyroidism, unspecified: Secondary | ICD-10-CM

## 2011-05-06 LAB — TSH: TSH: 4.08 u[IU]/mL (ref 0.35–5.50)

## 2011-05-06 NOTE — Progress Notes (Signed)
Labs only

## 2011-05-08 ENCOUNTER — Telehealth: Payer: Self-pay | Admitting: *Deleted

## 2011-05-08 NOTE — Telephone Encounter (Signed)
Message copied by Charlies Silvers on Thu May 08, 2011 10:58 AM ------      Message from: [name] E      Created: Wed May 07, 2011  5:59 PM       Advise pt, TSH stable, continue with same medicines

## 2011-05-08 NOTE — Telephone Encounter (Signed)
Message left for patient to return my call.  

## 2011-05-08 NOTE — Telephone Encounter (Signed)
Pt is aware.  

## 2011-05-19 ENCOUNTER — Encounter: Payer: Self-pay | Admitting: Internal Medicine

## 2011-05-19 ENCOUNTER — Ambulatory Visit (INDEPENDENT_AMBULATORY_CARE_PROVIDER_SITE_OTHER): Payer: Medicare PPO | Admitting: Internal Medicine

## 2011-05-19 VITALS — BP 134/84 | HR 93 | Temp 98.6°F | Wt 167.8 lb

## 2011-05-19 DIAGNOSIS — J029 Acute pharyngitis, unspecified: Secondary | ICD-10-CM

## 2011-05-19 DIAGNOSIS — J069 Acute upper respiratory infection, unspecified: Secondary | ICD-10-CM

## 2011-05-19 LAB — POCT RAPID STREP A (OFFICE): Rapid Strep A Screen: NEGATIVE

## 2011-05-19 MED ORDER — PREDNISONE 10 MG PO TABS: ORAL_TABLET | ORAL | Status: AC

## 2011-05-19 MED ORDER — AZITHROMYCIN 250 MG PO TABS: ORAL_TABLET | ORAL | Status: AC

## 2011-05-19 MED ORDER — AZELASTINE HCL 0.15 % NA SOLN: 2.0000 | Freq: Two times a day (BID) | NASAL | Status: DC

## 2011-05-19 NOTE — Assessment & Plan Note (Signed)
ST associated to PN drip, no GERD  Sx ENT exam not c/w acute sinusitis URI? Allergies? Plan: See instructions

## 2011-05-19 NOTE — Patient Instructions (Signed)
Rest, fluids , tylenol For cough, take Mucinex DM twice a day as needed  Prednisone x 5 days  Take astepro (nose spray )  twice a day x few weeks  If no better in few days, start antibiotics (zithromax) Call anytime if the symptoms are severe, you have high fever, not well in 2 weeks

## 2011-05-19 NOTE — Progress Notes (Signed)
  Subjective:    Patient ID: Angela Clay, female    DOB: 1933-11-09, 77 y.o.   MRN: [id number]  HPI Symptoms this started 2 weeks ago: Sore throat, fatigue, some mild sinus infection. Taking over-the-counter ibuprofen,   and using samples of a nasal spray   Past Medical History  Diagnosis Date  . GERD (gastroesophageal reflux disease)     EGD for dysphagia 2000 aprox (-)  . Hyperlipidemia   . Osteopenia   . Hypertension   . Lymphoma     neck,mlig,unspecified site dx 1970s  . Hypothyroidism   . OA (osteoarthritis)     severe at neck   Past Surgical History  Procedure Date  . Tubal ligation 1972  . Cholecystectomy 02/24/06  . Appendectomy   . Thyroidectomy       Review of Systems No GERD symptoms No fever No chest congestion She does have postnasal dripping, sometimes mucus is thick. No itchy eyes or itchy nose     Objective:   Physical Exam  Constitutional: She appears well-developed and well-nourished. No distress.  HENT:  Head: Normocephalic and atraumatic.  Right Ear: External ear normal.  Left Ear: External ear normal.  Nose: Nose normal.  Mouth/Throat: No oropharyngeal exudate.       Face  not tender to palpation  Eyes: No scleral icterus.  Cardiovascular: Normal rate, regular rhythm, normal heart sounds and intact distal pulses.   Pulmonary/Chest: Effort normal and breath sounds normal. No respiratory distress. She has no wheezes. She has no rales.  Musculoskeletal: She exhibits no edema.  Skin: She is not diaphoretic.          Assessment & Plan:

## 2011-06-18 ENCOUNTER — Other Ambulatory Visit: Payer: Self-pay | Admitting: Internal Medicine

## 2011-09-02 ENCOUNTER — Ambulatory Visit: Payer: Medicare PPO | Admitting: Internal Medicine

## 2011-09-02 ENCOUNTER — Encounter: Payer: Self-pay | Admitting: Internal Medicine

## 2011-09-05 ENCOUNTER — Other Ambulatory Visit: Payer: Self-pay | Admitting: Internal Medicine

## 2011-09-05 MED ORDER — HYDROCHLOROTHIAZIDE 25 MG PO TABS: 25.0000 mg | ORAL_TABLET | Freq: Every day | ORAL | Status: DC

## 2011-09-05 MED ORDER — LEVOTHYROXINE SODIUM 100 MCG PO TABS: 100.0000 ug | ORAL_TABLET | Freq: Every day | ORAL | Status: DC

## 2011-09-05 MED ORDER — OMEPRAZOLE 20 MG PO CPDR: 20.0000 mg | DELAYED_RELEASE_CAPSULE | Freq: Every day | ORAL | Status: DC

## 2011-09-10 ENCOUNTER — Other Ambulatory Visit: Payer: Self-pay | Admitting: Cardiology

## 2011-09-10 DIAGNOSIS — I6529 Occlusion and stenosis of unspecified carotid artery: Secondary | ICD-10-CM

## 2011-09-11 ENCOUNTER — Encounter (INDEPENDENT_AMBULATORY_CARE_PROVIDER_SITE_OTHER): Payer: Medicare PPO | Admitting: Cardiology

## 2011-09-11 DIAGNOSIS — I6529 Occlusion and stenosis of unspecified carotid artery: Secondary | ICD-10-CM

## 2011-09-16 ENCOUNTER — Telehealth: Payer: Self-pay

## 2011-09-16 NOTE — Telephone Encounter (Signed)
Message copied by Santiago Bumpers on Tue Sep 16, 2011  4:56 PM ------      Message from: [name] E      Created: Mon Sep 15, 2011  5:16 PM       Advise patient:      Left artery stable, right artery slightly worse.      I reviewed her chart because the right artery is a slightly worse I like her cholesterol to be even lower. Recommend to increase his Zocor to one full tablet daily.      OV for a checkup in one month, fasting

## 2011-09-16 NOTE — Telephone Encounter (Signed)
Pt aware and appt made for 1 month f/u. Pt advised to come in fasting

## 2011-10-01 ENCOUNTER — Ambulatory Visit (INDEPENDENT_AMBULATORY_CARE_PROVIDER_SITE_OTHER): Payer: Medicare PPO

## 2011-10-01 DIAGNOSIS — Z23 Encounter for immunization: Secondary | ICD-10-CM

## 2011-10-06 ENCOUNTER — Encounter: Payer: Self-pay | Admitting: Family

## 2011-10-06 ENCOUNTER — Ambulatory Visit (INDEPENDENT_AMBULATORY_CARE_PROVIDER_SITE_OTHER): Payer: Medicare PPO | Admitting: Family

## 2011-10-06 VITALS — BP 126/70 | HR 92 | Temp 98.3°F | Resp 16 | Wt 171.0 lb

## 2011-10-06 DIAGNOSIS — J069 Acute upper respiratory infection, unspecified: Secondary | ICD-10-CM

## 2011-10-06 MED ORDER — AZITHROMYCIN 250 MG PO TABS: ORAL_TABLET | ORAL | Status: AC

## 2011-10-06 NOTE — Assessment & Plan Note (Signed)
77 yr old female with 2 day hx of cough.  I suspect viral etiology at this point.  I did give her an Rx for zithromax, but instructed her to start only if symptoms worsen, or if not feeling better in 2-3 days. She verbalizes understanding.

## 2011-10-06 NOTE — Progress Notes (Signed)
Subjective:    Patient ID: Angela Clay, female    DOB: [birth year], 77 y.o.   MRN: [id number]  HPI  Ms.  Hollar is a 78 yr old female who presents today with chief complaint of cough. Started 48 hrs ago and is dry.  She reports mild fever 99 yesterday at home.  + hoarseness.  Denies sore throat.  Feels "lousy."  She had her flu shot last week.  She has taken robitussin night and some Aleve.  She has not taken anything today.     Review of Systems See HPI  Past Medical History  Diagnosis Date  . GERD (gastroesophageal reflux disease)     EGD for dysphagia 2000 aprox (-)  . Hyperlipidemia   . Osteopenia   . Hypertension   . Lymphoma     neck,mlig,unspecified site dx 1970s  . Hypothyroidism   . OA (osteoarthritis)     severe at neck    History   Social History  . Marital Status: Married    Spouse Name: N/A    Number of Children: N/A  . Years of Education: N/A   Occupational History  . Not on file.   Social History Main Topics  . Smoking status: Never Smoker   . Smokeless tobacco: Not on file  . Alcohol Use: No  . Drug Use: Not on file  . Sexually Active: Not on file   Other Topics Concern  . Not on file   Social History Narrative   3 kids, 8 GKGoes to the Y to exerciseDiet- improved     Past Surgical History  Procedure Date  . Tubal ligation 1972  . Cholecystectomy 02/24/06  . Appendectomy   . Thyroidectomy     Family History  Problem Relation Age of Onset  . Diabetes Neg Hx   . Colon cancer Neg Hx   . Breast cancer Neg Hx   . Stroke Mother   . Coronary artery disease Father   . Deep vein thrombosis Brother     PE    Allergies  Allergen Reactions  . Codeine     Current Outpatient Prescriptions on File Prior to Visit  Medication Sig Dispense Refill  . acetaminophen (TYLENOL) 500 MG tablet Take 500 mg by mouth every 6 (six) hours as needed.        Marland Kitchen aspirin 81 MG tablet Take 81 mg by mouth daily.        . Azelastine HCl (ASTEPRO) 0.15 % SOLN  Place 2 sprays into the nose 2 (two) times daily.  30 mL  1  . calcium-vitamin D (OSCAL) 250-125 MG-UNIT per tablet Take 1 tablet by mouth daily.        . hydrochlorothiazide (HYDRODIURIL) 25 MG tablet Take 1 tablet (25 mg total) by mouth daily.  90 tablet  1  . levothyroxine (SYNTHROID, LEVOTHROID) 100 MCG tablet Take 1 tablet (100 mcg total) by mouth daily.  90 tablet  1    BP 126/70  Pulse 92  Temp(Src) 98.3 F (36.8 C) (Oral)  Resp 16  Wt 171 lb 0.6 oz (77.583 kg)  SpO2 95%       Objective:   Physical Exam  Constitutional: She appears well-developed and well-nourished.  HENT:  Head: Normocephalic and atraumatic.  Eyes: Conjunctivae are normal. No scleral icterus.  Neck: Neck supple.  Cardiovascular: Normal rate and regular rhythm.   No murmur heard. Pulmonary/Chest: Effort normal and breath sounds normal. No respiratory distress. She has no wheezes. She has  no rales. She exhibits no tenderness.  Musculoskeletal: She exhibits no edema.  Lymphadenopathy:    She has no cervical adenopathy.          Assessment & Plan:

## 2011-10-06 NOTE — Patient Instructions (Signed)
Start zithromax if symptoms worsen or if not feeling better in 2-3 days.   Call our office if fever over 101.

## 2011-10-17 ENCOUNTER — Encounter: Payer: Self-pay | Admitting: Internal Medicine

## 2011-10-17 ENCOUNTER — Ambulatory Visit (INDEPENDENT_AMBULATORY_CARE_PROVIDER_SITE_OTHER): Payer: Medicare PPO | Admitting: Internal Medicine

## 2011-10-17 VITALS — BP 110/62 | HR 67 | Ht 65.0 in | Wt 170.0 lb

## 2011-10-17 DIAGNOSIS — J069 Acute upper respiratory infection, unspecified: Secondary | ICD-10-CM

## 2011-10-17 DIAGNOSIS — I779 Disorder of arteries and arterioles, unspecified: Secondary | ICD-10-CM

## 2011-10-17 DIAGNOSIS — E785 Hyperlipidemia, unspecified: Secondary | ICD-10-CM

## 2011-10-17 LAB — LIPID PANEL
Cholesterol: 180 mg/dL (ref 0–200)
HDL: 50.4 mg/dL (ref >39.00)
LDL Cholesterol: 92 mg/dL (ref 0–99)
Total CHOL/HDL Ratio: 4
Triglycerides: 186 mg/dL — ABNORMAL HIGH (ref 0.0–149.0)
VLDL: 37.2 mg/dL (ref 0.0–40.0)

## 2011-10-17 LAB — AST: AST: 18 U/L (ref 0–37)

## 2011-10-17 LAB — ALT: ALT: 21 U/L (ref 0–35)

## 2011-10-17 MED ORDER — ZOSTER VACCINE LIVE 19400 UNT/0.65ML ~~LOC~~ SOLR: 0.6500 mL | Freq: Once | SUBCUTANEOUS | Status: AC

## 2011-10-17 NOTE — Progress Notes (Signed)
  Subjective:    Patient ID: Angela Clay, female    DOB: 11/20/1933, 77 y.o.   MRN: [id number]  HPI Here for a followup of hyperlipidemia. Based on the last carotid ultrasound, we recommended to increase simvastatin from 20-40, blood compliance, no apparent side effects  Past Medical History: GERD, EGD for dysphagia 2000 aprox (-) Hyperlipidemia Osteopenia  Hypertension LYMPHOMA NECK, MLIG, UNSPECIFIED SITE dx 1970s  Hypothyroidism OA, severe at neck  Past Surgical History: Tubal ligation (1460) Cholecystectomy (02/24/2006) Appendectomy THYROIDECTOMY   Review of Systems Was recently seen with a URI, started to take a Z-Pak 3 days ago, feeling slightly better, still coughing. No fever. Since the last office visit, she went to see gynecology, note reviewed, see assessment and plan.     Objective:   Physical Exam  Constitutional: She is oriented to person, place, and time. She appears well-developed and well-nourished.  HENT:  Head: Normocephalic and atraumatic.       Face symmetric, not tender to palpation  Cardiovascular: Normal rate, regular rhythm and normal heart sounds.   No murmur heard. Pulmonary/Chest:       Very few rhonchi with cough otherwise examination is normal  Musculoskeletal: She exhibits no edema.  Neurological: She is alert and oriented to person, place, and time.  Psychiatric: She has a normal mood and affect. Her behavior is normal. Judgment and thought content normal.       Assessment & Plan:

## 2011-10-17 NOTE — Assessment & Plan Note (Signed)
Last carotid ultrasound 02/2011, RICA 40-59%, left ICA 60-79%. Based on these results we increase her cholesterol medication Next ultrasound 1 year Results discussed with the patient

## 2011-10-17 NOTE — Assessment & Plan Note (Signed)
Started Zithromax 3 days ago, improving

## 2011-10-17 NOTE — Assessment & Plan Note (Addendum)
Since the last office visit, when to gynecology 02-2011, vulvar biopsy was negative. Breast exam was repeated and it was normal according to the patient. Had a mammogram -- 2012 normal per patient.

## 2011-10-17 NOTE — Assessment & Plan Note (Signed)
Tolerating Will increase dose of simvastatin

## 2011-10-17 NOTE — Patient Instructions (Signed)
Continue same meds

## 2011-10-24 MED ORDER — ATORVASTATIN CALCIUM 40 MG PO TABS: 40.0000 mg | ORAL_TABLET | Freq: Every day | ORAL | Status: DC

## 2011-11-06 ENCOUNTER — Other Ambulatory Visit: Payer: Self-pay | Admitting: *Deleted

## 2011-11-06 MED ORDER — ATORVASTATIN CALCIUM 40 MG PO TABS: 40.0000 mg | ORAL_TABLET | Freq: Every day | ORAL | Status: DC

## 2011-12-01 ENCOUNTER — Telehealth: Payer: Self-pay | Admitting: Internal Medicine

## 2011-12-01 MED ORDER — LEVOTHYROXINE SODIUM 100 MCG PO TABS: 100.0000 ug | ORAL_TABLET | Freq: Every day | ORAL | Status: DC

## 2011-12-01 MED ORDER — HYDROCHLOROTHIAZIDE 25 MG PO TABS: 25.0000 mg | ORAL_TABLET | Freq: Every day | ORAL | Status: DC

## 2011-12-01 NOTE — Telephone Encounter (Signed)
rx sent to pharmacy by e-script  

## 2011-12-01 NOTE — Telephone Encounter (Signed)
Patient would like refills of levothyroxine and hydrodiuril sent to [facility] pharmacy

## 2012-02-10 ENCOUNTER — Telehealth: Payer: Self-pay | Admitting: Internal Medicine

## 2012-02-10 MED ORDER — ATORVASTATIN CALCIUM 40 MG PO TABS: 40.0000 mg | ORAL_TABLET | Freq: Every day | ORAL | Status: DC

## 2012-02-10 NOTE — Telephone Encounter (Signed)
Refill: Atorvastatin calcium tablet 40 mg. Take 1 by mouth daily. 90 say supply.

## 2012-02-10 NOTE — Telephone Encounter (Signed)
Refill done.  

## 2012-02-23 ENCOUNTER — Telehealth: Payer: Self-pay | Admitting: Internal Medicine

## 2012-02-23 NOTE — Telephone Encounter (Signed)
Just labs or does pt need an office visit? Please advise.

## 2012-02-23 NOTE — Telephone Encounter (Signed)
Patient requesting labs (fasting)  Last labs was 2012 below are copied notes from Chart Cholesterol has decreased but not significantly. LDL dropped from 95 to 92, I really like to see this Doppler in the 70s. Recommend to discontinue simvastatin 20 mg and start Lipitor 40 mg 1 by mouth each bedtime #30, 6 RF Needs to let us know if there is any side effects, followup in 4 months as planned, fasting  Does patient need just a lab apointment or an OV & fasting labs? Please review & advise & I will call patient back to schedule appointment Patient PH# [phone]

## 2012-02-23 NOTE — Telephone Encounter (Signed)
Due for a yearly checkup, please schedule, we will do fasting labs then

## 2012-02-23 NOTE — Telephone Encounter (Signed)
Patient scheduled for 6.11.13 at 930am

## 2012-02-23 NOTE — Telephone Encounter (Signed)
Please call pt to schedule. Thanks.

## 2012-04-06 ENCOUNTER — Ambulatory Visit (INDEPENDENT_AMBULATORY_CARE_PROVIDER_SITE_OTHER): Payer: MEDICARE | Admitting: Internal Medicine

## 2012-04-06 ENCOUNTER — Encounter: Payer: Self-pay | Admitting: Internal Medicine

## 2012-04-06 VITALS — BP 128/84 | HR 66 | Temp 97.8°F | Ht 63.75 in | Wt 168.0 lb

## 2012-04-06 DIAGNOSIS — E559 Vitamin D deficiency, unspecified: Secondary | ICD-10-CM

## 2012-04-06 DIAGNOSIS — E039 Hypothyroidism, unspecified: Secondary | ICD-10-CM

## 2012-04-06 DIAGNOSIS — I10 Essential (primary) hypertension: Secondary | ICD-10-CM

## 2012-04-06 DIAGNOSIS — Z0000 Encounter for general adult medical examination without abnormal findings: Secondary | ICD-10-CM

## 2012-04-06 DIAGNOSIS — E785 Hyperlipidemia, unspecified: Secondary | ICD-10-CM

## 2012-04-06 DIAGNOSIS — M899 Disorder of bone, unspecified: Secondary | ICD-10-CM

## 2012-04-06 LAB — LIPID PANEL
Cholesterol: 150 mg/dL (ref 0–200)
HDL: 56.9 mg/dL (ref >39.00)
LDL Cholesterol: 70 mg/dL (ref 0–99)
Total CHOL/HDL Ratio: 3
Triglycerides: 118 mg/dL (ref 0.0–149.0)
VLDL: 23.6 mg/dL (ref 0.0–40.0)

## 2012-04-06 LAB — CBC WITH DIFFERENTIAL/PLATELET
Basophils Absolute: 0.1 10*3/uL (ref 0.0–0.1)
Basophils Relative: 0.8 % (ref 0.0–3.0)
Eosinophils Absolute: 0.3 10*3/uL (ref 0.0–0.7)
Eosinophils Relative: 4.4 % (ref 0.0–5.0)
HCT: 40.9 % (ref 36.0–46.0)
Hemoglobin: 13.6 g/dL (ref 12.0–15.0)
Lymphocytes Relative: 20 % (ref 12.0–46.0)
Lymphs Abs: 1.3 10*3/uL (ref 0.7–4.0)
MCHC: 33.2 g/dL (ref 30.0–36.0)
MCV: 88.7 fl (ref 78.0–100.0)
Monocytes Absolute: 0.5 10*3/uL (ref 0.1–1.0)
Monocytes Relative: 7 % (ref 3.0–12.0)
Neutro Abs: 4.3 10*3/uL (ref 1.4–7.7)
Neutrophils Relative %: 67.8 % (ref 43.0–77.0)
Platelets: 198 10*3/uL (ref 150.0–400.0)
RBC: 4.61 Mil/uL (ref 3.87–5.11)
RDW: 14.2 % (ref 11.5–14.6)
WBC: 6.4 10*3/uL (ref 4.5–10.5)

## 2012-04-06 LAB — ALT: ALT: 27 U/L (ref 0–35)

## 2012-04-06 LAB — BASIC METABOLIC PANEL
BUN: 16 mg/dL (ref 6–23)
CO2: 30 mEq/L (ref 19–32)
Calcium: 9.8 mg/dL (ref 8.4–10.5)
Chloride: 105 mEq/L (ref 96–112)
Creatinine, Ser: 0.9 mg/dL (ref 0.4–1.2)
GFR: 63.82 mL/min (ref >60.00)
Glucose, Bld: 86 mg/dL (ref 70–99)
Potassium: 3.9 mEq/L (ref 3.5–5.1)
Sodium: 143 mEq/L (ref 135–145)

## 2012-04-06 LAB — TSH: TSH: 3.67 u[IU]/mL (ref 0.35–5.50)

## 2012-04-06 LAB — AST: AST: 24 U/L (ref 0–37)

## 2012-04-06 NOTE — Assessment & Plan Note (Signed)
Good med compliance , labs

## 2012-04-06 NOTE — Assessment & Plan Note (Signed)
Td  11-09 Pneumina shot 2008 Rx for  shingles shot provided, plans to do it this summer   never Cscope , (-) hemocults 11-08, 11-09, 3-11, 02-2011 We talk about cscopes but eventually elected to keep doing iFOB DRE normal  risk benefits of Cscope discussed, iFOB provided, Call if like Cscope    PAP (-) 06-2006, (-) PAP 11-09,  has several (-) PAP Had few hypochromic areas in the labia saw  gynecology 02-2011, vulvar biopsy was negative.  Plans to see her again, on steroids cream Had a mammogram  10- 2012 normal    Encouraged to cont healthy life style

## 2012-04-06 NOTE — Progress Notes (Signed)
  Subjective:    Patient ID: Angela Clay, female    DOB: 01/29/1934, 78 y.o.   MRN: [id number]  HPI Here for Medicare AWV:  1. Risk factors based on Past M, S, F history: reviewed  2. Physical Activities: active, home chores, active at church, [employer] x3/week  3. Depression/mood: No problems noted or reported  4. Hearing: Very good, no problems w/ normal conversation  5. ADL's: independent  6. Fall Risk: no recent falls, prevention discussed 7. Home Safety: does feelsafe at home  8. Height, weight, &visual acuity: see VS, uses glasses for driving and reading  9. Counseling: provided  10. Labs ordered based on risk factors: yes  11. Referral Coordination: if needed  12. Care Plan, see a/p  13. Cognitive Assessment:memory and cognition and motor skills seem appropiate   in addition, we discussed the following: High cholesterol, good medication compliance without apparent side effects. Hypertension, good medication compliance, ambulatory blood pressures within normal. Synthroid, good medication compliance. She has seen gynecologist and plans to have her female checkup this year as well.  Past Medical History:  GERD, EGD for dysphagia 2000 aprox (-)  Hyperlipidemia  Osteopenia  Hypertension  LYMPHOMA NECK, MLIG, UNSPECIFIED SITE dx 1970s  Hypothyroidism  OA, severe at neck   Past Surgical History:  Tubal ligation (3875)  Cholecystectomy (02/24/2006)  Appendectomy  THYROIDECTOMY   History   Social History  . Marital Status: Married    Spouse Name: N/A    Number of Children: 3  . Years of Education: N/A   Occupational History  . retired    Social History Main Topics  . Smoking status: Never Smoker   . Smokeless tobacco: Never Used  . Alcohol Use: No  . Drug Use: No  . Sexually Active: Not on file   Other Topics Concern  . Not on file   Social History Narrative   3 kids, 8 GK   Family History  Problem Relation Age of Onset  . Diabetes Neg Hx   .  Colon cancer Neg Hx   . Breast cancer Neg Hx   . Stroke Mother 53  . Coronary artery disease Father     MI age 33  . Deep vein thrombosis Brother     PE at age 80    Review of Systems No chest pain or shortness or breath No lower extremity edema No nausea, vomiting, diarrhea or blood in the stools. No dysuria or gross hematuria.     Objective:   Physical Exam General -- alert, well-developed, and well-nourished.   Neck --no thyromegaly , normal carotid pulse Lungs -- normal respiratory effort, no intercostal retractions, no accessory muscle use, and normal breath sounds.   Heart-- normal rate, regular rhythm, no murmur, and no gallop.   Abdomen--soft, non-tender, no distention, no masses, no HSM, no guarding, and no rigidity.  No bruit Extremities-- no pretibial edema bilaterally Neurologic-- alert & oriented X3 and strength normal in all extremities. Psych-- Cognition and judgment appear intact. Alert and cooperative with normal attention span and concentration.  not anxious appearing and not depressed appearing.      Assessment & Plan:

## 2012-04-06 NOTE — Assessment & Plan Note (Addendum)
Well controlled, labs  Reports a slt increase sunburn to the degree of exposure--->  possible for HCTZ, rec sunscrren, hats, long sleeves; call if problems

## 2012-04-06 NOTE — Assessment & Plan Note (Signed)
On a fosamax holiday, next DEXA 2014 H/o low vit D---- labs  rec ca and vit D daily

## 2012-04-06 NOTE — Assessment & Plan Note (Signed)
Good compliance, labs

## 2012-04-07 LAB — VITAMIN D 25 HYDROXY (VIT D DEFICIENCY, FRACTURES): Vit D, 25-Hydroxy: 32 ng/mL (ref 30–89)

## 2012-04-08 ENCOUNTER — Encounter: Payer: Self-pay | Admitting: Internal Medicine

## 2012-05-14 ENCOUNTER — Other Ambulatory Visit: Payer: Self-pay | Admitting: Internal Medicine

## 2012-05-14 MED ORDER — ATORVASTATIN CALCIUM 40 MG PO TABS: 40.0000 mg | ORAL_TABLET | Freq: Every day | ORAL | Status: DC

## 2012-05-14 MED ORDER — HYDROCHLOROTHIAZIDE 25 MG PO TABS: 25.0000 mg | ORAL_TABLET | Freq: Every day | ORAL | Status: DC

## 2012-05-14 MED ORDER — LEVOTHYROXINE SODIUM 100 MCG PO TABS: 100.0000 ug | ORAL_TABLET | Freq: Every day | ORAL | Status: DC

## 2012-05-14 NOTE — Telephone Encounter (Signed)
Refills x 3 last ov 6.11.13  CPE V70  1-Atorvastatin Calcium (Tab) LIPITOR 40 MG Take 1 tablet (40 mg total) by mouth daily. #90-last wrt 4.16.13  2-Hydrochlorothiazide (Tab) HYDRODIURIL 25 MG Take 1 tablet (25 mg total) by mouth daily #90-last wrt 2.4.13  3-Levothyroxine Sodium (Tab) SYNTHROID, LEVOTHROID 100 MCG Take 1 tablet (100 mcg total) by mouth daily. #90 - last wrt 4.16.13

## 2012-05-14 NOTE — Telephone Encounter (Signed)
Refill done.  

## 2012-06-03 ENCOUNTER — Other Ambulatory Visit: Payer: MEDICARE

## 2012-06-03 DIAGNOSIS — Z0000 Encounter for general adult medical examination without abnormal findings: Secondary | ICD-10-CM

## 2012-06-03 LAB — FECAL OCCULT BLOOD, IMMUNOCHEMICAL: Fecal Occult Bld: NEGATIVE

## 2012-08-04 ENCOUNTER — Other Ambulatory Visit: Payer: Self-pay | Admitting: Internal Medicine

## 2012-08-04 MED ORDER — OMEPRAZOLE 20 MG PO CPDR: 20.0000 mg | DELAYED_RELEASE_CAPSULE | Freq: Every day | ORAL | Status: DC | PRN

## 2012-08-04 NOTE — Telephone Encounter (Signed)
Refill done.  

## 2012-09-13 ENCOUNTER — Encounter: Payer: Self-pay | Admitting: Internal Medicine

## 2012-09-16 ENCOUNTER — Telehealth: Payer: Self-pay | Admitting: Internal Medicine

## 2012-09-16 NOTE — Telephone Encounter (Signed)
Due for a carotid ultrasound.  please arrange, DX carotid artery disease

## 2012-09-17 NOTE — Telephone Encounter (Signed)
Discussed with pt & entered orders.

## 2012-09-22 ENCOUNTER — Telehealth: Payer: Self-pay | Admitting: Internal Medicine

## 2012-09-22 DIAGNOSIS — Z1211 Encounter for screening for malignant neoplasm of colon: Secondary | ICD-10-CM

## 2012-09-22 NOTE — Telephone Encounter (Signed)
Appointment Request From: Angela Clay      With Provider: [name], MD [-Primary Care Physician-]      Preferred Date Range: Any      Preferred Times: Any      Reason: To address the following health maintenance concerns.   Pneumococcal Polysaccharide Vaccine Age 76 And Over   Colonoscopy   Influenza Vaccine      Comments:   I have an appointment on December 16 at 10:00 am., and would like to have the Pneumococcal vaccine and Flu shot at that appointment.      I have had my shingles vaccine 08/16/2012.      Due to the holidays, scheduling a colonoscopy is a little more difficult. If it could be scheduled on 10/08/2012 or 10/15/2012 ??      Thanks,   Angela Clay

## 2012-09-22 NOTE — Telephone Encounter (Signed)
Okay to refer to GI for colonoscopy. She had two pneumonia shots, per guidelines she does not need another one, although if she  requests an additional shot, that is ok with me

## 2012-09-22 NOTE — Telephone Encounter (Signed)
Pt would like a colonoscopy. OK to enter referral?

## 2012-09-22 NOTE — Telephone Encounter (Signed)
Referral entered  

## 2012-09-27 ENCOUNTER — Encounter (INDEPENDENT_AMBULATORY_CARE_PROVIDER_SITE_OTHER): Payer: Medicare Other

## 2012-09-27 DIAGNOSIS — I6529 Occlusion and stenosis of unspecified carotid artery: Secondary | ICD-10-CM

## 2012-10-11 ENCOUNTER — Encounter: Payer: Self-pay | Admitting: Internal Medicine

## 2012-10-11 ENCOUNTER — Ambulatory Visit (INDEPENDENT_AMBULATORY_CARE_PROVIDER_SITE_OTHER): Payer: Medicare Other | Admitting: Internal Medicine

## 2012-10-11 VITALS — BP 124/80 | HR 76 | Temp 97.5°F | Wt 174.0 lb

## 2012-10-11 DIAGNOSIS — E039 Hypothyroidism, unspecified: Secondary | ICD-10-CM

## 2012-10-11 DIAGNOSIS — I10 Essential (primary) hypertension: Secondary | ICD-10-CM

## 2012-10-11 DIAGNOSIS — Z23 Encounter for immunization: Secondary | ICD-10-CM

## 2012-10-11 DIAGNOSIS — M1990 Unspecified osteoarthritis, unspecified site: Secondary | ICD-10-CM

## 2012-10-11 DIAGNOSIS — I779 Disorder of arteries and arterioles, unspecified: Secondary | ICD-10-CM

## 2012-10-11 DIAGNOSIS — E785 Hyperlipidemia, unspecified: Secondary | ICD-10-CM

## 2012-10-11 LAB — TSH: TSH: 2.9 u[IU]/mL (ref 0.35–5.50)

## 2012-10-11 NOTE — Assessment & Plan Note (Signed)
Chart reviewed, last carotid ultrasound 09/2012, stable, repeat in one year

## 2012-10-11 NOTE — Assessment & Plan Note (Signed)
Well controlled with current meds, no change

## 2012-10-11 NOTE — Assessment & Plan Note (Signed)
Good compliance with medications, check a TSH

## 2012-10-11 NOTE — Assessment & Plan Note (Signed)
Wonders if she still needs Lipitor, her concern is mostly financial. I reviewed all her previous cholesterol panels w/ her , she is under excellent control, I think is fine to take half Lipitor (20 mg daily) for now and recheck in 6 months

## 2012-10-11 NOTE — Assessment & Plan Note (Signed)
Takes occasional Motrin for pain and mild swelling in her hands, not more often than 3 times a week so far, no GI side effects. Okay to continue Motrin OTC, if she needs it more often than  5 or 6 times a week she needs to let me know. GI precautions discussed

## 2012-10-11 NOTE — Progress Notes (Signed)
  Subjective:    Patient ID: Angela Clay, female    DOB: 1934/10/21, 78 y.o.   MRN: [id number]  HPI Routine visit High cholesterol, good medication compliance, wonders if she still needs cholesterol medication. See assessment and plan. Hypothyroidism good medication compliance. GERD, requiring Prilosec only as needed Hypertension good medication compliance, reports normal ambulatory BPs DJD, takes ibuprofen OTC 200 mg -400 mg prn, does not take it more often than 3 times a week. Wonders if that is okay.  Past Medical History:   GERD, EGD for dysphagia 2000 aprox (-)   Hyperlipidemia   Osteopenia   Hypertension   LYMPHOMA NECK, MLIG, UNSPECIFIED SITE dx 1970s   Hypothyroidism   OA, severe at neck   Past Surgical History:   Tubal ligation (0352)   Cholecystectomy (02/24/2006)   Appendectomy   THYROIDECTOMY     Family History  Problem Relation Age of Onset  . Diabetes Neg Hx   . Colon cancer Neg Hx   . Breast cancer Neg Hx   . Stroke Mother 2  . Coronary artery disease Father     MI age 6  . Deep vein thrombosis Brother     PE at age 52     Review of Systems Denies chest pain or shortness or breath No nausea, vomiting, diarrhea or blood in the stools. Has not had her flu shot    Objective:   Physical Exam General -- alert, well-developed Lungs -- normal respiratory effort, no intercostal retractions, no accessory muscle use, and normal breath sounds.   Heart-- normal rate, regular rhythm, no murmur, and no gallop.   Extremities--  hands and wrists without evidence of synovitis  Psych-- Cognition and judgment appear intact. Alert and cooperative with normal attention span and concentration.  not anxious appearing and not depressed appearing.       Assessment & Plan:

## 2012-10-15 ENCOUNTER — Encounter: Payer: Self-pay | Admitting: Internal Medicine

## 2012-11-12 ENCOUNTER — Ambulatory Visit (AMBULATORY_SURGERY_CENTER): Payer: Medicare Other | Admitting: *Deleted

## 2012-11-12 VITALS — Ht 63.0 in | Wt 176.0 lb

## 2012-11-12 DIAGNOSIS — Z1211 Encounter for screening for malignant neoplasm of colon: Secondary | ICD-10-CM

## 2012-11-12 MED ORDER — NA SULFATE-K SULFATE-MG SULF 17.5-3.13-1.6 GM/177ML PO SOLN: ORAL | Status: DC

## 2012-11-26 ENCOUNTER — Encounter: Payer: Self-pay | Admitting: Internal Medicine

## 2012-11-26 ENCOUNTER — Ambulatory Visit (AMBULATORY_SURGERY_CENTER): Payer: Medicare Other | Admitting: Internal Medicine

## 2012-11-26 VITALS — BP 111/68 | HR 53 | Temp 96.5°F | Resp 57 | Ht 63.0 in | Wt 176.0 lb

## 2012-11-26 DIAGNOSIS — Z1211 Encounter for screening for malignant neoplasm of colon: Secondary | ICD-10-CM

## 2012-11-26 DIAGNOSIS — D126 Benign neoplasm of colon, unspecified: Secondary | ICD-10-CM

## 2012-11-26 MED ORDER — SODIUM CHLORIDE 0.9 % IV SOLN: 500.0000 mL | INTRAVENOUS | Status: DC

## 2012-11-26 NOTE — Op Note (Signed)
[facility]  [address] [place], [address]   COLONOSCOPY PROCEDURE REPORT  PATIENT: Angela, Clay  MR#: [id number] BIRTHDATE: 1934-10-26 , 79  yrs. old GENDER: Female ENDOSCOPIST: Lafayette Dragon, MD REFERRED BY:  [name], M.D. PROCEDURE DATE:  11/26/2012 PROCEDURE:   Colonoscopy, screening and Colonoscopy with cold biopsy polypectomy ASA CLASS:   Class II INDICATIONS:Average risk patient for colon cancer. MEDICATIONS: MAC sedation, administered by CRNA and Propofol (Diprivan) mg IV  DESCRIPTION OF PROCEDURE:   After the risks and benefits and of the procedure were explained, informed consent was obtained.  A digital rectal exam revealed no abnormalities of the rectum.    The LB [id number]  endoscope was introduced through the anus and advanced to the cecum, which was identified by both the appendix and ileocecal valve .  The quality of the prep was good, using MoviPrep .  The instrument was then slowly withdrawn as the colon was fully examined.     COLON FINDINGS: A diminutive sessile polyp was found in the descending colon.  A polypectomy was performed with cold forceps. The resection was complete and the polyp tissue was completely retrieved.   Mild diverticulosis was noted in the sigmoid colon. Retroflexed views revealed no abnormalities.     The scope was then withdrawn from the patient and the procedure completed.  COMPLICATIONS: There were no complications. ENDOSCOPIC IMPRESSION: 1.   Diminutive sessile polyp was found in the descending colon; polypectomy was performed with cold forceps 2.   Mild diverticulosis was noted in the sigmoid colon  RECOMMENDATIONS: 1.  Await pathology results 2.  High fiber diet   REPEAT EXAM: for No recall due to age..  cc:  _______________________________ eSignedLafayette Dragon, MD 11/26/2012 11:31 AM     PATIENT NAME:  Angela, Clay MR#: [id number]

## 2012-11-26 NOTE — Patient Instructions (Addendum)
YOU HAD AN ENDOSCOPIC PROCEDURE TODAY AT [facility]: Refer to the procedure report that was given to you for any specific questions about what was found during the examination.  If the procedure report does not answer your questions, please call your gastroenterologist to clarify.  If you requested that your care partner not be given the details of your procedure findings, then the procedure report has been included in a sealed envelope for you to review at your convenience later.  YOU SHOULD EXPECT: Some feelings of bloating in the abdomen. Passage of more gas than usual.  Walking can help get rid of the air that was put into your GI tract during the procedure and reduce the bloating. If you had a lower endoscopy (such as a colonoscopy or flexible sigmoidoscopy) you may notice spotting of blood in your stool or on the toilet paper. If you underwent a bowel prep for your procedure, then you may not have a normal bowel movement for a few days.  DIET: Your first meal following the procedure should be a light meal and then it is ok to progress to your normal diet.  A half-sandwich or bowl of soup is an example of a good first meal.  Heavy or fried foods are harder to digest and may make you feel nauseous or bloated.  Likewise meals heavy in dairy and vegetables can cause extra gas to form and this can also increase the bloating.  Drink plenty of fluids but you should avoid alcoholic beverages for 24 hours.  ACTIVITY: Your care partner should take you home directly after the procedure.  You should plan to take it easy, moving slowly for the rest of the day.  You can resume normal activity the day after the procedure however you should NOT DRIVE or use heavy machinery for 24 hours (because of the sedation medicines used during the test).    SYMPTOMS TO REPORT IMMEDIATELY: A gastroenterologist can be reached at any hour.  During normal business hours, 8:30 AM to 5:00 PM Monday through Friday,  call [phone].  After hours and on weekends, please call the GI answering service at [phone] who will take a message and have the physician on call contact you.   Following lower endoscopy (colonoscopy or flexible sigmoidoscopy):  Excessive amounts of blood in the stool  Significant tenderness or worsening of abdominal pains  Swelling of the abdomen that is new, acute  Fever of 100F or higher   FOLLOW UP: If any biopsies were taken you will be contacted by phone or by letter within the next 1-3 weeks.  Call your gastroenterologist if you have not heard about the biopsies in 3 weeks.  Our staff will call the home number listed on your records the next business day following your procedure to check on you and address any questions or concerns that you may have at that time regarding the information given to you following your procedure. This is a courtesy call and so if there is no answer at the home number and we have not heard from you through the emergency physician on call, we will assume that you have returned to your regular daily activities without incident.  SIGNATURES/CONFIDENTIALITY: You and/or your care partner have signed paperwork which will be entered into your electronic medical record.  These signatures attest to the fact that that the information above on your After Visit Summary has been reviewed and is understood.  Full responsibility of the confidentiality of  this discharge information lies with you and/or your care-partner.    Diverticulosis, high fiber diet, polyp information given.  No recall due to your age.

## 2012-11-26 NOTE — Progress Notes (Signed)
No egg or soy allergy. emw

## 2012-11-26 NOTE — Progress Notes (Signed)
Patient did not experience any of the following events: a burn prior to discharge; a fall within the facility; wrong site/side/patient/procedure/implant event; or a hospital transfer or hospital admission upon discharge from the facility. (G8907) Patient did not have preoperative order for IV antibiotic SSI prophylaxis. (G8918)  

## 2012-11-26 NOTE — Progress Notes (Signed)
Called to room to assist during endoscopic procedure.  Patient ID and intended procedure confirmed with present staff. Received instructions for my participation in the procedure from the performing physician.  

## 2012-11-27 DIAGNOSIS — R55 Syncope and collapse: Secondary | ICD-10-CM

## 2012-11-27 HISTORY — DX: Syncope and collapse: R55

## 2012-11-29 ENCOUNTER — Telehealth: Payer: Self-pay | Admitting: *Deleted

## 2012-11-29 NOTE — Telephone Encounter (Signed)
NO ANSWER, MESSAGE  LEFT FOR THE PATIENT.

## 2012-11-30 ENCOUNTER — Ambulatory Visit: Payer: Medicare Other | Admitting: Family Medicine

## 2012-11-30 ENCOUNTER — Encounter: Payer: Self-pay | Admitting: Internal Medicine

## 2012-12-01 ENCOUNTER — Emergency Department (HOSPITAL_COMMUNITY): Payer: Medicare Other

## 2012-12-01 ENCOUNTER — Inpatient Hospital Stay (HOSPITAL_COMMUNITY): Payer: Medicare Other

## 2012-12-01 ENCOUNTER — Inpatient Hospital Stay (HOSPITAL_COMMUNITY)
Admission: EM | Admit: 2012-12-01 | Discharge: 2012-12-02 | DRG: 312 | Disposition: A | Discharge status: Home / Self Care | Payer: Medicare Other | Attending: Internal Medicine | Admitting: Internal Medicine

## 2012-12-01 ENCOUNTER — Encounter (HOSPITAL_COMMUNITY): Payer: Self-pay | Admitting: *Deleted

## 2012-12-01 ENCOUNTER — Telehealth: Payer: Self-pay | Admitting: Internal Medicine

## 2012-12-01 DIAGNOSIS — Z7982 Long term (current) use of aspirin: Secondary | ICD-10-CM

## 2012-12-01 DIAGNOSIS — Z833 Family history of diabetes mellitus: Secondary | ICD-10-CM

## 2012-12-01 DIAGNOSIS — M1990 Unspecified osteoarthritis, unspecified site: Secondary | ICD-10-CM | POA: Diagnosis present

## 2012-12-01 DIAGNOSIS — R55 Syncope and collapse: Principal | ICD-10-CM

## 2012-12-01 DIAGNOSIS — Z8249 Family history of ischemic heart disease and other diseases of the circulatory system: Secondary | ICD-10-CM

## 2012-12-01 DIAGNOSIS — Z9089 Acquired absence of other organs: Secondary | ICD-10-CM

## 2012-12-01 DIAGNOSIS — I10 Essential (primary) hypertension: Secondary | ICD-10-CM

## 2012-12-01 DIAGNOSIS — D371 Neoplasm of uncertain behavior of stomach: Secondary | ICD-10-CM | POA: Diagnosis present

## 2012-12-01 DIAGNOSIS — Z87898 Personal history of other specified conditions: Secondary | ICD-10-CM

## 2012-12-01 DIAGNOSIS — K5730 Diverticulosis of large intestine without perforation or abscess without bleeding: Secondary | ICD-10-CM | POA: Diagnosis present

## 2012-12-01 DIAGNOSIS — E785 Hyperlipidemia, unspecified: Secondary | ICD-10-CM

## 2012-12-01 DIAGNOSIS — K219 Gastro-esophageal reflux disease without esophagitis: Secondary | ICD-10-CM | POA: Diagnosis present

## 2012-12-01 DIAGNOSIS — Z823 Family history of stroke: Secondary | ICD-10-CM

## 2012-12-01 DIAGNOSIS — E039 Hypothyroidism, unspecified: Secondary | ICD-10-CM

## 2012-12-01 DIAGNOSIS — Z809 Family history of malignant neoplasm, unspecified: Secondary | ICD-10-CM

## 2012-12-01 LAB — URINE MICROSCOPIC-ADD ON

## 2012-12-01 LAB — RAPID URINE DRUG SCREEN, HOSP PERFORMED
Amphetamines: NOT DETECTED
Barbiturates: NOT DETECTED
Benzodiazepines: NOT DETECTED
Cocaine: NOT DETECTED
Opiates: NOT DETECTED
Tetrahydrocannabinol: NOT DETECTED

## 2012-12-01 LAB — COMPREHENSIVE METABOLIC PANEL WITH GFR
ALT: 26 U/L (ref 0–35)
AST: 26 U/L (ref 0–37)
Albumin: 3.8 g/dL (ref 3.5–5.2)
Alkaline Phosphatase: 90 U/L (ref 39–117)
BUN: 19 mg/dL (ref 6–23)
CO2: 24 meq/L (ref 19–32)
Calcium: 9.9 mg/dL (ref 8.4–10.5)
Chloride: 98 meq/L (ref 96–112)
Creatinine, Ser: 0.85 mg/dL (ref 0.50–1.10)
GFR calc Af Amer: 75 mL/min — ABNORMAL LOW
GFR calc non Af Amer: 64 mL/min — ABNORMAL LOW
Glucose, Bld: 109 mg/dL — ABNORMAL HIGH (ref 70–99)
Potassium: 3.9 meq/L (ref 3.5–5.1)
Sodium: 136 meq/L (ref 135–145)
Total Bilirubin: 0.7 mg/dL (ref 0.3–1.2)
Total Protein: 7.2 g/dL (ref 6.0–8.3)

## 2012-12-01 LAB — POCT I-STAT TROPONIN I: Troponin i, poc: 0 ng/mL (ref 0.00–0.08)

## 2012-12-01 LAB — URINALYSIS, ROUTINE W REFLEX MICROSCOPIC
Bilirubin Urine: NEGATIVE
Glucose, UA: NEGATIVE mg/dL
Hgb urine dipstick: NEGATIVE
Ketones, ur: NEGATIVE mg/dL
Nitrite: NEGATIVE
Protein, ur: NEGATIVE mg/dL
Specific Gravity, Urine: 1.01 (ref 1.005–1.030)
Urobilinogen, UA: 0.2 mg/dL (ref 0.0–1.0)
pH: 6.5 (ref 5.0–8.0)

## 2012-12-01 LAB — CREATININE, SERUM
Creatinine, Ser: 0.83 mg/dL (ref 0.50–1.10)
GFR calc Af Amer: 77 mL/min — ABNORMAL LOW (ref >90)
GFR calc non Af Amer: 66 mL/min — ABNORMAL LOW (ref >90)

## 2012-12-01 LAB — CBC
HCT: 41.9 % (ref 36.0–46.0)
HCT: 39.6 % (ref 36.0–46.0)
Hemoglobin: 14.1 g/dL (ref 12.0–15.0)
Hemoglobin: 13.5 g/dL (ref 12.0–15.0)
MCH: 29.4 pg (ref 26.0–34.0)
MCH: 29.7 pg (ref 26.0–34.0)
MCHC: 33.7 g/dL (ref 30.0–36.0)
MCHC: 34.1 g/dL (ref 30.0–36.0)
MCV: 87.5 fL (ref 78.0–100.0)
MCV: 87 fL (ref 78.0–100.0)
Platelets: 177 10*3/uL (ref 150–400)
Platelets: 158 10*3/uL (ref 150–400)
RBC: 4.79 MIL/uL (ref 3.87–5.11)
RBC: 4.55 MIL/uL (ref 3.87–5.11)
RDW: 14.1 % (ref 11.5–15.5)
RDW: 14.1 % (ref 11.5–15.5)
WBC: 12.5 10*3/uL — ABNORMAL HIGH (ref 4.0–10.5)
WBC: 9.8 10*3/uL (ref 4.0–10.5)

## 2012-12-01 LAB — GLUCOSE, CAPILLARY: Glucose-Capillary: 120 mg/dL — ABNORMAL HIGH (ref 70–99)

## 2012-12-01 LAB — TSH: TSH: 2.884 u[IU]/mL (ref 0.350–4.500)

## 2012-12-01 MED ORDER — ATORVASTATIN CALCIUM 20 MG PO TABS
20.0000 mg | ORAL_TABLET | Freq: Every day | ORAL | Status: DC
  Administered 2012-12-01: 20 mg via ORAL
  Filled 2012-12-01 (×2): qty 1

## 2012-12-01 MED ORDER — ACETAMINOPHEN 500 MG PO TABS
500.0000 mg | ORAL_TABLET | Freq: Three times a day (TID) | ORAL | Status: DC | PRN
  Filled 2012-12-01: qty 1

## 2012-12-01 MED ORDER — MECLIZINE HCL 12.5 MG PO TABS
12.5000 mg | ORAL_TABLET | Freq: Two times a day (BID) | ORAL | Status: DC
  Administered 2012-12-01: 12.5 mg via ORAL
  Filled 2012-12-01 (×3): qty 1

## 2012-12-01 MED ORDER — HEPARIN SODIUM (PORCINE) 5000 UNIT/ML IJ SOLN
5000.0000 [IU] | Freq: Three times a day (TID) | INTRAMUSCULAR | Status: DC
  Administered 2012-12-01 – 2012-12-02 (×2): 5000 [IU] via SUBCUTANEOUS
  Filled 2012-12-01 (×5): qty 1

## 2012-12-01 MED ORDER — LEVOTHYROXINE SODIUM 100 MCG PO TABS
100.0000 ug | ORAL_TABLET | Freq: Every day | ORAL | Status: DC
  Administered 2012-12-02: 100 ug via ORAL
  Filled 2012-12-01: qty 1

## 2012-12-01 MED ORDER — SODIUM CHLORIDE 0.9 % IV SOLN
INTRAVENOUS | Status: AC
  Administered 2012-12-01: 21:00:00 via INTRAVENOUS

## 2012-12-01 MED ORDER — ASPIRIN 325 MG PO TABS
325.0000 mg | ORAL_TABLET | Freq: Every day | ORAL | Status: DC
  Administered 2012-12-01 – 2012-12-02 (×2): 325 mg via ORAL
  Filled 2012-12-01 (×2): qty 1

## 2012-12-01 NOTE — ED Notes (Signed)
Pt lying on side in bed talking with family member. Pt pleasant, laughing and talking. Discussing her activity with [employer] was planning to go this morning before the fall occurred about 0400. Pt denies dizziness. Speech clear, moving all extremities.

## 2012-12-01 NOTE — ED Notes (Signed)
Pt states she woke up at 0400 with nausea and dizziness adn then had a syncopal episode and hit posterior head.  Pt states that dizziness has resolved but she still has pain to posterior head from hitting head.  Pt states pain improved after taking an aleve.  Pt is complaining of tailbone pain from fall as well

## 2012-12-01 NOTE — Telephone Encounter (Signed)
Patient Information:  Caller Name: [place]  Phone: [phone]  Patient: Angela Clay, Angela Clay  Gender: Female  DOB: 18-Aug-1934  Age: 79 Years  PCP: [name]  Office Follow Up:  Does the office need to follow up with this patient?: No  Instructions For The Office: N/A  RN Note:  Fainted x 1 and bumped her head on the door.  Blood pressure 83/? (not sure of number) after fainting.  States she went back to bed and got back up at 1100 12/01/12 and ate some breakfast and states she now feels better.  Hit her head "on the way down" and has a sore spot on the left back scalp.  States she "has been prone to faint, but that has been 30-40 years ago."  Due to head injury during fall while fainting, advised ED; patient agrees to go to [facility] ED.   krs/can  Symptoms  Reason For Call & Symptoms: nausea and dizziness at 0430 12/01/12; she did pass out.  Reviewed Health History In EMR: Yes  Reviewed Medications In EMR: Yes  Reviewed Allergies In EMR: Yes  Reviewed Surgeries / Procedures: Yes  Date of Onset of Symptoms: 12/01/2012  Guideline(s) Used:  Fainting  Disposition Per Guideline:   Go to ED Now  Reason For Disposition Reached:   Any head or face injury  Advice Given:  N/A

## 2012-12-01 NOTE — Telephone Encounter (Signed)
Please advise 

## 2012-12-01 NOTE — Telephone Encounter (Signed)
Patient fainted, going to the ER. Please check on her tomorrow morning

## 2012-12-01 NOTE — ED Notes (Signed)
Pt was made aware of need of an urine specimen; pt cannot go at this time; family at bedside

## 2012-12-01 NOTE — ED Notes (Signed)
Pt has returned from being out of the department; pt placed by on monitor,continuous pulse oximetry and blood pressure cuff; family at bedside

## 2012-12-01 NOTE — ED Provider Notes (Addendum)
History     CSN: [id number]  Arrival date & time 12/01/12  1246   First MD Initiated Contact with Patient 12/01/12 1503      Chief Complaint  Patient presents with  . Loss of Consciousness    (Consider location/radiation/quality/duration/timing/severity/associated sxs/prior treatment) HPI Comments: Patient presents to the ER for evaluation of syncope. Patient reports that she woke early this morning and was feeling nausea. She was going to the kitchen to get some hiatus and started to feel weak, dizzy and then things grade out on her and she fell to the ground. There was brief loss of consciousness. She did hit the back of her head and has a headache. She also reports that she had her telephone and has had some soreness in this area as well. No radiation of pain to the legs and no numbness or tingling. She took Aleve and the headache has improved.  Patient has not had any chest pain or difficulty breathing. She denies heart palpitations. She says over the course of the day she has had several episodes where she felt like she was going to pass out, but has not passed out again. Symptoms are accompanied by diaphoresis and sensation of dizziness and severe weakness. At the moment, however, she feels fine without any complaints.  Patient is a 79 y.o. female presenting with syncope.  Loss of Consciousness Pertinent negatives include no chest pain and no shortness of breath.    Past Medical History  Diagnosis Date  . GERD (gastroesophageal reflux disease)     EGD for dysphagia 2000 aprox (-)  . Hyperlipidemia   . Osteopenia   . Hypertension   . Lymphoma     neck,mlig,unspecified site dx 1970s  . Hypothyroidism   . OA (osteoarthritis)     severe at neck    Past Surgical History  Procedure Date  . Tubal ligation 1972  . Cholecystectomy 02/24/06  . Appendectomy   . Thyroidectomy   . Tonsillectomy and adenoidectomy 1949    Family History  Problem Relation Age of Onset  . Diabetes  Neg Hx   . Colon cancer Neg Hx   . Breast cancer Neg Hx   . Stroke Mother 73  . Coronary artery disease Father     MI age 60  . Deep vein thrombosis Brother     PE at age 59    History  Substance Use Topics  . Smoking status: Never Smoker   . Smokeless tobacco: Never Used  . Alcohol Use: No    OB History    Grav Para Term Preterm Abortions TAB SAB Ect Mult Living                  Review of Systems  Constitutional: Positive for diaphoresis.  HENT: Negative for neck pain.   Respiratory: Negative for shortness of breath.   Cardiovascular: Positive for syncope. Negative for chest pain.  Gastrointestinal: Positive for nausea. Negative for vomiting.  Neurological: Positive for syncope and weakness.  All other systems reviewed and are negative.    Allergies  Codeine  Home Medications   Current Outpatient Rx  Name  Route  Sig  Dispense  Refill  . ACETAMINOPHEN 500 MG PO TABS   Oral   Take 500 mg by mouth every 6 (six) hours as needed.           . ASPIRIN 81 MG PO TABS   Oral   Take 81 mg by mouth daily.           Marland Kitchen  ATORVASTATIN CALCIUM 40 MG PO TABS   Oral   Take 20 mg by mouth daily.         Marland Kitchen CALCIUM-VITAMIN D 250-125 MG-UNIT PO TABS   Oral   Take 1 tablet by mouth daily.           Marland Kitchen CLOBETASOL PROPIONATE 0.05 % EX CREA   Topical   Apply 1 application topically 2 (two) times daily.         Marland Kitchen HYDROCHLOROTHIAZIDE 25 MG PO TABS   Oral   Take 1 tablet (25 mg total) by mouth daily.   90 tablet   2   . LEVOTHYROXINE SODIUM 100 MCG PO TABS   Oral   Take 1 tablet (100 mcg total) by mouth daily.   90 tablet   2   . ALEVE PO   Oral   Take by mouth as needed.         Marland Kitchen OMEPRAZOLE 20 MG PO CPDR   Oral   Take 1 capsule (20 mg total) by mouth daily as needed.   90 capsule   2     BP 104/59  Pulse 93  Temp 97.2 F (36.2 C) (Oral)  Resp 18  SpO2 97%  Physical Exam  Constitutional: She is oriented to person, place, and time. She  appears well-developed and well-nourished. No distress.  HENT:  Head: Normocephalic and atraumatic.  Right Ear: Hearing normal.  Nose: Nose normal.  Mouth/Throat: Oropharynx is clear and moist and mucous membranes are normal.  Eyes: Conjunctivae normal and EOM are normal. Pupils are equal, round, and reactive to light.  Neck: Normal range of motion. Neck supple.  Cardiovascular: Normal rate, regular rhythm, S1 normal and S2 normal.  Exam reveals no gallop and no friction rub.   No murmur heard. Pulmonary/Chest: Effort normal and breath sounds normal. No respiratory distress. She exhibits no tenderness.  Abdominal: Soft. Normal appearance and bowel sounds are normal. There is no hepatosplenomegaly. There is no tenderness. There is no rebound, no guarding, no tenderness at McBurney's point and negative Murphy's sign. No hernia.  Musculoskeletal: Normal range of motion.  Neurological: She is alert and oriented to person, place, and time. She has normal strength. No cranial nerve deficit or sensory deficit. Coordination normal. GCS eye subscore is 4. GCS verbal subscore is 5. GCS motor subscore is 6.  Skin: Skin is warm, dry and intact. No rash noted. No cyanosis.  Psychiatric: She has a normal mood and affect. Her speech is normal and behavior is normal. Thought content normal.    ED Course  Procedures (including critical care time)   Date: 12/01/2012  Rate: 72  Rhythm: normal sinus rhythm  QRS Axis: normal  Intervals: normal  ST/T Wave abnormalities: normal  Conduction Disutrbances: none  Narrative Interpretation: unremarkable     Labs Reviewed  CBC - Abnormal; Notable for the following:    WBC 12.5 (*)     All other components within normal limits  COMPREHENSIVE METABOLIC PANEL - Abnormal; Notable for the following:    Glucose, Bld 109 (*)     GFR calc non Af Amer 64 (*)     GFR calc Af Amer 75 (*)     All other components within normal limits  GLUCOSE, CAPILLARY - Abnormal;  Notable for the following:    Glucose-Capillary 120 (*)     All other components within normal limits  POCT I-STAT TROPONIN I  URINALYSIS, ROUTINE W REFLEX MICROSCOPIC   Dg Lumbar  Spine Complete  12/01/2012  *RADIOLOGY REPORT*  Clinical Data: 79 year old female status post fall with pain.  LUMBAR SPINE - COMPLETE 4+ VIEW  Comparison: None.  Findings: Normal lumbar segmentation.  Surgical clips extend along the retroperitoneum on the right from the lower lumbar spine to the inguinal level.  Moderate to severe disc space loss at L4-L5 and L5- S1 with endplate sclerosis.  Vacuum disc phenomena suspected. Trace retrolisthesis of L4 on L5.  Trace anterolisthesis of L3 on L4.  No lumbar compression fracture.  Sacrum appears intact. Sacral ala within normal limits.  Multilevel mild to moderate lumbar facet hypertrophy.  IMPRESSION: 1. No acute fracture or listhesis identified in the lumbar spine. 2.  Advanced L4-L5 and L5-S1 disc degeneration.  Mild to moderate facet degeneration maximal the lower lumbar spine.   Original Report Authenticated By: Roselyn Reef, M.D.    Dg Sacrum/coccyx  12/01/2012  *RADIOLOGY REPORT*  Clinical Data: Loss of consciousness.  Fall.  Sacral pain.  SACRUM AND COCCYX - 2+ VIEW  Comparison: None available.  Findings: There is a slight step-off at the sacral coccygeal junction.  This appears healed, likely representing a remote fracture.  Degenerative changes are present at L4-5 and L5-S1 with slight retrolisthesis at L4-5 and significant loss of disc height at L5-S1. Degenerative changes are present at the SI joints as well.  IMPRESSION:  1.  Slight step-off of the sacro-coccygeal junction.  This appears well healed and more likely a remote injury. 2.  No other acute fracture. 3.  Degenerative changes in the lower lumbar spine.   Original Report Authenticated By: San Morelle, M.D.    Ct Head Wo Contrast  12/01/2012  *RADIOLOGY REPORT*  Clinical Data: Syncope, headache.  CT HEAD  WITHOUT CONTRAST  Technique:  Contiguous axial images were obtained from the base of the skull through the vertex without contrast.  Comparison: None.  Findings: Focal area of low attenuation is seen in the right cerebellum and appears fairly well demarcated (image 8).  A focal rounded area of low attenuation in the right frontal deep white matter is slightly less well defined (image 15).  No evidence of acute hemorrhage, mass lesion, mass effect or hydrocephalus.  No air fluid levels in the visualized portions of the paranasal sinuses and mastoid air cells.  IMPRESSION:  1.  Focal area of low attenuation in the right frontal deep white matter may represent a small age indeterminate infarct. 2.  Fairly well circumscribed low attenuation lesion in the right cerebellar hemisphere may represent a remote infarct.   Original Report Authenticated By: Lorin Picket, M.D.      Diagnoses: 1. Syncope 2. Possible CVA    MDM  Patient presents to the ER after a syncopal episode. This episode occurred earlier this morning, but she has had several episodes since then of near-syncope. Symptoms are of unclear etiology at this time. She does not have any focal neurologic findings. She did fall and hit her head. A CT scan was performed and there is a focal area of possible CVA on the CT scan. There is no accompanying neurologic deficits on examination. Patient will undergo MRI. I will asked the hospitalist service to observe her overnight, however. If MRI shows acute stroke, she requires hospitalization. If the MRI is negative, then her syncopal episodes are unexplained and she requires monitoring.        Orpah Greek, MD 12/01/12 1723  Orpah Greek, MD 12/01/12 1728

## 2012-12-01 NOTE — ED Notes (Signed)
Spoke with MRI, they will transport patient upstairs following MRI which they will be coming to get patient in 10 mins. Raquel Sarna, 3W made aware.

## 2012-12-01 NOTE — ED Notes (Signed)
Pt in MRI. Called verify that MRI will be able to transport patient upstairs after study is completed. Stating transport up to room on 3 W.

## 2012-12-01 NOTE — ED Notes (Signed)
Admitting MD at bedside.

## 2012-12-01 NOTE — ED Notes (Signed)
Pt still not in room at this time; family in room

## 2012-12-01 NOTE — H&P (Signed)
[facility] History and Physical  [name] ZJI:[id number] DOB: Jan 16, 1934 DOA: 12/01/2012  Referring physician: Betsey Clay  PCP: Angela November, MD  Specialists: None  Chief Complaint: Syncope  HPI: Angela Clay is a 79 y.o. female who presented to the [facility] ED 2.5.14 after awakening at 04:00-she was nauseous and though initially this was 2/2 to what she had to eat last night.  She states she passed out-doesn;t think she completely had LOC.  During the day she has had several episodes when  she had lightheadedness.. She thinks that everything sort of "fades away" when she has these issues.-she has a premonition of this coming on.  She has had this in the past-she says this would happen when she would get up too [place] and she would pass out-she hasn't done this in over 40 years-no one specific psotion of the head or movement makes her feel this way. This was noticed as well in the ED and when she was sitting in the chair she had a wave of sickness and sweating whic lasted for about 20 min-no blurred or double vision, no speech disturbance, no tongue biting, no h/o sz in the past no h/o CVA, no CP, no fevers, no recent sicknesses, no earaches-does seem to have some element of tinnnitus. Felt nauseated with all of this but did not throw-up. No diarrea, no dysuria  In emergency room patient had essentially normal basic metabolic panel, slight elevation of white count 12.5 without fever hemoglobin 14.1. CT of the head showed focal area of low attenuation right frontal deep white matter which may represent a small age-indeterminate infarct, fairly well circumscribed low attenuation lesion right cerebellar hemisphere representing a remote infarct Lumbar spine film showed no acute fracture listhesis, advanced L4/5 distal generation L5/S1-S2 generation mild to moderate facet degeneration maximal in the lower spine  Review of Systems:   Past Medical History  Diagnosis Date  . GERD (gastroesophageal  reflux disease)     EGD for dysphagia 2000 aprox (-)  . Hyperlipidemia   . Osteopenia   . Hypertension   . Lymphoma     neck,mlig,unspecified site dx 1970s  . Hypothyroidism   . OA (osteoarthritis)     severe at neck   Past Surgical History  Procedure Date  . Tubal ligation 1972  . Cholecystectomy 02/24/06  . Appendectomy   . Thyroidectomy   . Tonsillectomy and adenoidectomy 1949   Social History:  reports that she has never smoked. She has never used smokeless tobacco. She reports that she does not drink alcohol or use illicit drugs. Lives with husband at home Can do all ADL's  Allergies  Allergen Reactions  . Codeine Nausea And Vomiting    Family History  Problem Relation Age of Onset  . Diabetes Neg Hx   . Colon cancer Neg Hx   . Breast cancer Neg Hx   . Stroke Mother 42  . Coronary artery disease Father     MI age 69  . Deep vein thrombosis Brother     PE at age 5    Prior to Admission medications   Medication Sig Start Date End Date Taking? Authorizing Provider  acetaminophen (TYLENOL) 500 MG tablet Take 500 mg by mouth every 6 (six) hours as needed. For pain   Yes Historical Provider, MD  aspirin 81 MG tablet Take 81 mg by mouth daily.     Yes Historical Provider, MD  atorvastatin (LIPITOR) 40 MG tablet Take 20 mg by mouth at bedtime.  05/14/12 05/14/13 Yes Colon Branch, MD  calcium-vitamin D (OSCAL) 250-125 MG-UNIT per tablet Take 1 tablet by mouth.    Yes Historical Provider, MD  clobetasol cream (TEMOVATE) 1.54 % Apply 1 application topically 2 (two) times daily.   Yes Historical Provider, MD  hydrochlorothiazide (HYDRODIURIL) 25 MG tablet Take 1 tablet (25 mg total) by mouth daily. 05/14/12  Yes Colon Branch, MD  levothyroxine (SYNTHROID, LEVOTHROID) 100 MCG tablet Take 1 tablet (100 mcg total) by mouth daily. 05/14/12  Yes Colon Branch, MD  Naproxen Sodium (ALEVE PO) Take by mouth as needed.   Yes Historical Provider, MD  omeprazole (PRILOSEC) 20 MG capsule Take 20  mg by mouth daily as needed. For acid reflux 08/04/12  Yes Colon Branch, MD   Physical Exam: Filed Vitals:   12/01/12 1315 12/01/12 1702  BP: 104/59 116/68  Pulse: 93 75  Temp: 97.2 F (36.2 C)   TempSrc: Oral   Resp: 18 18  SpO2: 97% 96%     General:  Alert pleasant oriented.   Eyes: Extraocular movements intact. Vision that her confrontation is normal. Finger-nose-finger test is normal.  ENT: No uvula depression. Smile is symmetrical.  Neck: No JVD? Bruit left-sided neck  Cardiovascular: S1-S2 murmur second intercostal space 4/6  Respiratory: Clinically clear  Abdomen: Soft nontender nondistended  Skin: No lower extremity edema  Musculoskeletal: Range motion intact euthymic  Psychiatric: Euthymic  Neurologic: Motor grossly normal 5/5 strength, reflexes are hyperreflexic 2-3/3. Strength in major muscle groups is 5 out 5. Sensation grossly intact. No cerebellar signs. Dorsalis pedis felt well and plantars are downgoing bilaterally. Gait not assessed. Shoulder shrug trapezius normal.  Labs on Admission:  Basic Metabolic Panel:  Lab 00/86/76 1528  NA 136  K 3.9  CL 98  CO2 24  GLUCOSE 109*  BUN 19  CREATININE 0.85  CALCIUM 9.9  MG --  PHOS --   Liver Function Tests:  Lab 12/01/12 1528  AST 26  ALT 26  ALKPHOS 90  BILITOT 0.7  PROT 7.2  ALBUMIN 3.8   No results found for this basename: LIPASE:5,AMYLASE:5 in the last 168 hours No results found for this basename: AMMONIA:5 in the last 168 hours CBC:  Lab 12/01/12 1528  WBC 12.5*  NEUTROABS --  HGB 14.1  HCT 41.9  MCV 87.5  PLT 177   Cardiac Enzymes: No results found for this basename: CKTOTAL:5,CKMB:5,CKMBINDEX:5,TROPONINI:5 in the last 168 hours  BNP (last 3 results) No results found for this basename: PROBNP:3 in the last 8760 hours CBG:  Lab 12/01/12 1326  GLUCAP 120*    Radiological Exams on Admission: Dg Lumbar Spine Complete  12/01/2012  *RADIOLOGY REPORT*  Clinical Data: 79 year old  female status post fall with pain.  LUMBAR SPINE - COMPLETE 4+ VIEW  Comparison: None.  Findings: Normal lumbar segmentation.  Surgical clips extend along the retroperitoneum on the right from the lower lumbar spine to the inguinal level.  Moderate to severe disc space loss at L4-L5 and L5- S1 with endplate sclerosis.  Vacuum disc phenomena suspected. Trace retrolisthesis of L4 on L5.  Trace anterolisthesis of L3 on L4.  No lumbar compression fracture.  Sacrum appears intact. Sacral ala within normal limits.  Multilevel mild to moderate lumbar facet hypertrophy.  IMPRESSION: 1. No acute fracture or listhesis identified in the lumbar spine. 2.  Advanced L4-L5 and L5-S1 disc degeneration.  Mild to moderate facet degeneration maximal the lower lumbar spine.   Original Report Authenticated By: H.  Neale Burly, M.D.    Dg Sacrum/coccyx  12/01/2012  *RADIOLOGY REPORT*  Clinical Data: Loss of consciousness.  Fall.  Sacral pain.  SACRUM AND COCCYX - 2+ VIEW  Comparison: None available.  Findings: There is a slight step-off at the sacral coccygeal junction.  This appears healed, likely representing a remote fracture.  Degenerative changes are present at L4-5 and L5-S1 with slight retrolisthesis at L4-5 and significant loss of disc height at L5-S1. Degenerative changes are present at the SI joints as well.  IMPRESSION:  1.  Slight step-off of the sacro-coccygeal junction.  This appears well healed and more likely a remote injury. 2.  No other acute fracture. 3.  Degenerative changes in the lower lumbar spine.   Original Report Authenticated By: San Morelle, M.D.    Ct Head Wo Contrast  12/01/2012  *RADIOLOGY REPORT*  Clinical Data: Syncope, headache.  CT HEAD WITHOUT CONTRAST  Technique:  Contiguous axial images were obtained from the base of the skull through the vertex without contrast.  Comparison: None.  Findings: Focal area of low attenuation is seen in the right cerebellum and appears fairly well demarcated  (image 8).  A focal rounded area of low attenuation in the right frontal deep white matter is slightly less well defined (image 15).  No evidence of acute hemorrhage, mass lesion, mass effect or hydrocephalus.  No air fluid levels in the visualized portions of the paranasal sinuses and mastoid air cells.  IMPRESSION:  1.  Focal area of low attenuation in the right frontal deep white matter may represent a small age indeterminate infarct. 2.  Fairly well circumscribed low attenuation lesion in the right cerebellar hemisphere may represent a remote infarct.   Original Report Authenticated By: Lorin Picket, M.D.     EKG: Independently reviewed. Sinus rhythm PR interval 0.08 QRS axis 40 no acute ST-T wave changes normal sinus rhythm  Assessment/Plan Principal Problem:  *Syncope, rule out CVA Active Problems:  HYPOTHYROIDISM  HYPERLIPIDEMIA  HYPERTENSION  THYROIDECTOMY, HX OF   1. Syncopal episode rule out CVA-CT scan shows age-indeterminate infarct. We will get MRI/MRA brain. She had carotid ultrasounds done December 2013 which showed 60% and 70% stenosis on right left respectively. She'll need further workup. Allergy to aspirin 07/29/2024 milligrams given no acute infarct-if this is a TIA she represented herself outside the window for TPA. She'll be monitored on telemetry-is not on any AV nodal agents however she is on HCTZ which will be discontinued. We will try on meclizine as well 2. Hypertension-monitor. Will allow to run high. See above. 3. Hyperlipidemia continue statin at home dose of 20 mg. Get lipid panel. 4. History of thyroidectomy-stable at present continue Synthroid 100 mcg daily-human and nonspecific findings will get a TSH 5. Hyperplastic polyp on colonoscopy-stable  None at present if MI rules in posture we will consult neurology  Code Status: Full  Family Communication: Scars with daughter and husband at bedside  Disposition Plan: Inpatient   Time spent: [place],  [facility] Pager [phone]  If 7PM-7AM, please contact night-coverage [email or web address] Password [email or web address] 12/01/2012, 5:57 PM

## 2012-12-02 DIAGNOSIS — I359 Nonrheumatic aortic valve disorder, unspecified: Secondary | ICD-10-CM

## 2012-12-02 DIAGNOSIS — R55 Syncope and collapse: Secondary | ICD-10-CM

## 2012-12-02 DIAGNOSIS — E039 Hypothyroidism, unspecified: Secondary | ICD-10-CM

## 2012-12-02 DIAGNOSIS — I10 Essential (primary) hypertension: Secondary | ICD-10-CM

## 2012-12-02 DIAGNOSIS — E785 Hyperlipidemia, unspecified: Secondary | ICD-10-CM

## 2012-12-02 LAB — URINE CULTURE
Colony Count: NO GROWTH
Culture: NO GROWTH

## 2012-12-02 LAB — LIPID PANEL
Cholesterol: 145 mg/dL (ref 0–200)
HDL: 57 mg/dL (ref >39)
LDL Cholesterol: 52 mg/dL (ref 0–99)
Total CHOL/HDL Ratio: 2.5 RATIO
Triglycerides: 178 mg/dL — ABNORMAL HIGH (ref <150)
VLDL: 36 mg/dL (ref 0–40)

## 2012-12-02 LAB — HEMOGLOBIN A1C
Hgb A1c MFr Bld: 5.9 % — ABNORMAL HIGH (ref <5.7)
Mean Plasma Glucose: 123 mg/dL — ABNORMAL HIGH (ref <117)

## 2012-12-02 MED ORDER — STROKE: EARLY STAGES OF RECOVERY BOOK
Freq: Once | Status: DC
  Filled 2012-12-02: qty 1

## 2012-12-02 NOTE — Telephone Encounter (Signed)
lmovm for pt to return call.  

## 2012-12-02 NOTE — Progress Notes (Signed)
Pt discharged to home per MD order. Pt received and reviewed all discharge instructions and medication information including follow-up appointments and prescriptions.  Pt also received stroke discharge education. Pt verbalized understanding. Pt alert and oriented at discharge with no complaints of pain. Pt escorted to private vehicle via wheelchair by nurse tech. Angela Clay

## 2012-12-02 NOTE — Progress Notes (Signed)
  Echocardiogram 2D Echocardiogram has been performed.  Angela Clay 12/02/2012, 3:48 PM

## 2012-12-02 NOTE — Discharge Summary (Signed)
Physician Discharge Summary  Angela Clay XWR:[id number] DOB: Mar 05, 1934 DOA: 12/01/2012  PCP: [name], MD  Admit date: 12/01/2012 Discharge date: 12/02/2012  Time spent: less than 30 minutes  Recommendations for Outpatient Follow-up:  1. With Dr. [name], PCP in 1 week.  Discharge Diagnoses:  Principal Problem:  *Syncope, rule out CVA Active Problems:  HYPOTHYROIDISM  HYPERLIPIDEMIA  HYPERTENSION  THYROIDECTOMY, HX OF   Discharge Condition: Improved and stable.  Diet recommendation: Heart Healthy.  Filed Weights   12/01/12 2000  Weight: 77.747 kg (171 lb 6.4 oz)    History of present illness:  79 y/o female with PMH of GERD, HL, HTN, Hypothyroid and Lymphoma was admitted on 12/01/12 with an episode of passing out, collapse and hitting her head.   Hospital Course:  1. Syncope & collapse: etiology possibly vaso vagal. Patient was asymptomatic/usual state of health until approximately 4 am o day of admission. She woke up and felt nauseous. Gives history of eating sea food at [employer] prior and thinks she might have eaten too much greasy food. She the went to kitchen to get some ice. She felt dizzy, passed out and fell backwards hitting the back of her head. She can't remember how long she had passed out. She denied any cardio respiratory symptoms. No urinary incontinence, tongue bite or bleeding. After waking up she went and lied down in bed. Subsequently woke up and felt better. She informed her daughter who advised her to come to the hospital. Work up in hospital- Ct Head, MRI Head, Echo, Carotid dopplers and tele monitoring were unremarkable. She denies any further symptoms. She denies dizziness, lightheadedness, vertigo or tinnitus. She ambulated steadily.  2. Hypertension: controlled. 3. Hyperlipidemia: Continue home medications 4. GERD: continue PPI's. 5. Hypothyroid: Synthroid.   Procedures:  None  Consultations:  None  Discharge  Exam:  Complaints: Mild pain at back of head. No dizziness or light headedness.   Filed Vitals:   12/01/12 2000 12/02/12 0000 12/02/12 0400 12/02/12 1400  BP: 144/73 142/71 110/57 109/68  Pulse: 68  76 76  Temp:   98 F (36.7 C) 97.4 F (36.3 C)  TempSrc:   Oral   Resp: 16   18  Height: 5' 4"  (1.626 m)     Weight: 77.747 kg (171 lb 6.4 oz)     SpO2: 92%  97% 97%     General exam: pleasant and comfortable  Respiratory System: clear. No Increased WOB.  CVS: S1 & S2 heard, RRR. No JVD, murmurs or pedal edema. Telemetry: SR.  GI: Abdomen is ND, NT, soft and normal bowel sounds heard.  CNS: alert and oriented. No focal deficits.  Extremities: Symmetric 5/5 power.  Neck: no carotid bruits.  Head: Mild occipital scalp tenderness but no obvious swelling or open wound.  Discharge Instructions      Discharge Orders    Future Orders Please Complete By Expires   Diet - low sodium heart healthy      Increase activity slowly      Call MD for:  persistant dizziness or light-headedness      Call MD for:      Comments:   Passing out.       Medication List     As of 12/02/2012  5:04 PM    TAKE these medications         acetaminophen 500 MG tablet   Commonly known as: TYLENOL   Take 500 mg by mouth every 6 (six) hours  as needed. For pain      ALEVE PO   Take by mouth as needed.      aspirin 81 MG tablet   Take 81 mg by mouth daily.      atorvastatin 40 MG tablet   Commonly known as: LIPITOR   Take 20 mg by mouth at bedtime.      calcium-vitamin D 250-125 MG-UNIT per tablet   Commonly known as: OSCAL   Take 1 tablet by mouth.      clobetasol cream 0.05 %   Commonly known as: TEMOVATE   Apply 1 application topically 2 (two) times daily.      hydrochlorothiazide 25 MG tablet   Commonly known as: HYDRODIURIL   Take 1 tablet (25 mg total) by mouth daily.      levothyroxine 100 MCG tablet   Commonly known as: SYNTHROID, LEVOTHROID   Take 1 tablet (100 mcg  total) by mouth daily.      omeprazole 20 MG capsule   Commonly known as: PRILOSEC   Take 20 mg by mouth daily as needed. For acid reflux           The results of significant diagnostics from this hospitalization (including imaging, microbiology, ancillary and laboratory) are listed below for reference.    Significant Diagnostic Studies: Dg Lumbar Spine Complete  12/01/2012  *RADIOLOGY REPORT*  Clinical Data: 79 year old female status post fall with pain.  LUMBAR SPINE - COMPLETE 4+ VIEW  Comparison: None.  Findings: Normal lumbar segmentation.  Surgical clips extend along the retroperitoneum on the right from the lower lumbar spine to the inguinal level.  Moderate to severe disc space loss at L4-L5 and L5- S1 with endplate sclerosis.  Vacuum disc phenomena suspected. Trace retrolisthesis of L4 on L5.  Trace anterolisthesis of L3 on L4.  No lumbar compression fracture.  Sacrum appears intact. Sacral ala within normal limits.  Multilevel mild to moderate lumbar facet hypertrophy.  IMPRESSION: 1. No acute fracture or listhesis identified in the lumbar spine. 2.  Advanced L4-L5 and L5-S1 disc degeneration.  Mild to moderate facet degeneration maximal the lower lumbar spine.   Original Report Authenticated By: Roselyn Reef, M.D.    Dg Sacrum/coccyx  12/01/2012  *RADIOLOGY REPORT*  Clinical Data: Loss of consciousness.  Fall.  Sacral pain.  SACRUM AND COCCYX - 2+ VIEW  Comparison: None available.  Findings: There is a slight step-off at the sacral coccygeal junction.  This appears healed, likely representing a remote fracture.  Degenerative changes are present at L4-5 and L5-S1 with slight retrolisthesis at L4-5 and significant loss of disc height at L5-S1. Degenerative changes are present at the SI joints as well.  IMPRESSION:  1.  Slight step-off of the sacro-coccygeal junction.  This appears well healed and more likely a remote injury. 2.  No other acute fracture. 3.  Degenerative changes in the lower  lumbar spine.   Original Report Authenticated By: San Morelle, M.D.    Ct Head Wo Contrast  12/01/2012  *RADIOLOGY REPORT*  Clinical Data: Syncope, headache.  CT HEAD WITHOUT CONTRAST  Technique:  Contiguous axial images were obtained from the base of the skull through the vertex without contrast.  Comparison: None.  Findings: Focal area of low attenuation is seen in the right cerebellum and appears fairly well demarcated (image 8).  A focal rounded area of low attenuation in the right frontal deep white matter is slightly less well defined (image 15).  No evidence of acute hemorrhage, mass  lesion, mass effect or hydrocephalus.  No air fluid levels in the visualized portions of the paranasal sinuses and mastoid air cells.  IMPRESSION:  1.  Focal area of low attenuation in the right frontal deep white matter may represent a small age indeterminate infarct. 2.  Fairly well circumscribed low attenuation lesion in the right cerebellar hemisphere may represent a remote infarct.   Original Report Authenticated By: Lorin Picket, M.D.    Mr Brain Wo Contrast  12/01/2012  *RADIOLOGY REPORT*  Clinical Data: The patient became nauseous and passed out.  Loss of consciousness.  Headache.  No focal deficits.  Hypertension. Hyperlipidemia.  History of lymphoma.  MRI HEAD WITHOUT CONTRAST  Technique:  Multiplanar, multiecho pulse sequences of the brain and surrounding structures were obtained according to standard protocol without intravenous contrast.  Comparison: 12/01/2012 CT.  No comparison MR.  Findings: No acute infarct.  Remote small infarct right cerebellum.  Small rounded nonspecific white matter type changes frontal lobe larger on the right.  In a patient of this age with history of hypertension hyperlipidemic, findings are most consistent with result of small vessel disease.  Evaluating for intracranial malignancy/lymphoma limited without contrast enhancement.  If there are progressive symptoms, contrast  enhanced MR may be considered to exclude enhancement of the frontal lobe white matter lesions.  Subcutaneous hematoma.  This may be related to the patient's recent fall.  On the CT scan there is a subtle lucency inferior occipital region however this is well corticated and not felt to represent an acute fracture.  No intracranial hemorrhage.  No hydrocephalus.  Major intracranial vascular structures are patent.  Right C2-3 facet joint degenerative changes.  Cervical medullary junction, small pituitary region, pineal region and orbital structures unremarkable.  Minimal mucosal thickening paranasal sinuses.  IMPRESSION: No acute infarct.  Remote small infarct right cerebellum.  Small rounded nonspecific white matter type changes frontal lobe larger on the right.  In a patient of this age with history of hypertension hyperlipidemic, findings are most consistent with result of small vessel disease. Please see above discussion.  Subcutaneous hematoma.  This may be related to the patient's recent fall.   Original Report Authenticated By: Genia Del, M.D.    2 D Echo  Study Conclusions  - Left ventricle: The cavity size was normal. Wall thickness was normal. Systolic function was normal. The estimated ejection fraction was in the range of 60% to 65%. Wall motion was normal; there were no regional wall motion abnormalities. Doppler parameters are consistent with abnormal left ventricular relaxation (grade 1 diastolic dysfunction). - Aortic valve: Mild regurgitation. - Atrial septum: No defect or patent foramen ovale was identified.  Impressions: - No cardiac source of emboli was indentified.  Carotid Dopplers:   Preliminary report: There is no ICA stenosis. Vertebral artery flow is antegrade.   Microbiology: Recent Results (from the past 240 hour(s))  URINE CULTURE     Status: Normal   Collection Time   12/01/12  5:33 PM      Component Value Range Status Comment   Specimen Description URINE, CLEAN  CATCH   Final    Special Requests NONE   Final    Culture  Setup Time 12/01/2012 19:11   Final    Colony Count NO GROWTH   Final    Culture NO GROWTH   Final    Report Status 12/02/2012 FINAL   Final      Labs: Basic Metabolic Panel:  Lab 62/37/62 2025 12/01/12 1528  NA --  136  K -- 3.9  CL -- 98  CO2 -- 24  GLUCOSE -- 109*  BUN -- 19  CREATININE 0.83 0.85  CALCIUM -- 9.9  MG -- --  PHOS -- --   Liver Function Tests:  Lab 12/01/12 1528  AST 26  ALT 26  ALKPHOS 90  BILITOT 0.7  PROT 7.2  ALBUMIN 3.8   No results found for this basename: LIPASE:5,AMYLASE:5 in the last 168 hours No results found for this basename: AMMONIA:5 in the last 168 hours CBC:  Lab 12/01/12 2025 12/01/12 1528  WBC 9.8 12.5*  NEUTROABS -- --  HGB 13.5 14.1  HCT 39.6 41.9  MCV 87.0 87.5  PLT 158 177   Cardiac Enzymes: No results found for this basename: CKTOTAL:5,CKMB:5,CKMBINDEX:5,TROPONINI:5 in the last 168 hours BNP: BNP (last 3 results) No results found for this basename: PROBNP:3 in the last 8760 hours CBG:  Lab 12/01/12 1326  GLUCAP 120*   Additional labs  Fasting Lipids: only significant for TG 178  HbA1C: 5.9  TSH: 2.884  UDS: neg    Signed:  Elyzabeth Goatley  [facility] 12/02/2012, 5:04 PM

## 2012-12-02 NOTE — Progress Notes (Signed)
VASCULAR LAB PRELIMINARY  PRELIMINARY  PRELIMINARY  PRELIMINARY  Carotid Dopplers completed.    Preliminary report: There is no ICA stenosis.  Vertebral artery flow is antegrade.  Bedie Dominey, RVT 12/02/2012, 3:30 PM

## 2012-12-03 NOTE — Progress Notes (Signed)
UR Completed.  Angela Clay [phone] 12/03/2012

## 2012-12-04 ENCOUNTER — Telehealth: Payer: Self-pay | Admitting: Internal Medicine

## 2012-12-04 NOTE — Telephone Encounter (Signed)
Was d/c from the hospital after a syncope, please call and check on the pt, schedule a f/u as well

## 2012-12-06 ENCOUNTER — Telehealth: Payer: Self-pay | Admitting: Internal Medicine

## 2012-12-06 NOTE — Telephone Encounter (Signed)
Patient Information:  Caller Name: Nyjai  Phone: [phone]  Patient: Yvone, Slape  Gender: Female  DOB: 1933-11-14  Age: 79 Years  PCP: [name]  Office Follow Up:  Does the office need to follow up with this patient?: Yes  Instructions For The Office: Pt calling to speak with Ria Comment.   Symptoms  Reason For Call & Symptoms: Calling to speak with Mendel Ryder in the office.  States Mendel Ryder is expecting her call.  Reviewed Health History In EMR: N/A  Reviewed Medications In EMR: N/A  Reviewed Allergies In EMR: N/A  Reviewed Surgeries / Procedures: N/A  Date of Onset of Symptoms: Unknown  Guideline(s) Used:  No Protocol Available - Information Only  Disposition Per Guideline:   Discuss with PCP and Callback by Nurse Today  Reason For Disposition Reached:   Nursing judgment  Advice Given:  N/A

## 2012-12-06 NOTE — Telephone Encounter (Signed)
Spoke with pt, scheduled appt. 2.12.14 @ 1pm.

## 2012-12-06 NOTE — Telephone Encounter (Signed)
Spoke with pt, scheduled appt 2.12.14 @1pm .

## 2012-12-07 ENCOUNTER — Ambulatory Visit (INDEPENDENT_AMBULATORY_CARE_PROVIDER_SITE_OTHER): Payer: Medicare Other | Admitting: Internal Medicine

## 2012-12-07 ENCOUNTER — Encounter: Payer: Self-pay | Admitting: Internal Medicine

## 2012-12-07 VITALS — BP 102/64 | HR 83 | Temp 97.5°F | Wt 174.0 lb

## 2012-12-07 DIAGNOSIS — R7309 Other abnormal glucose: Secondary | ICD-10-CM

## 2012-12-07 DIAGNOSIS — R739 Hyperglycemia, unspecified: Secondary | ICD-10-CM | POA: Insufficient documentation

## 2012-12-07 DIAGNOSIS — E119 Type 2 diabetes mellitus without complications: Secondary | ICD-10-CM | POA: Insufficient documentation

## 2012-12-07 DIAGNOSIS — I779 Disorder of arteries and arterioles, unspecified: Secondary | ICD-10-CM

## 2012-12-07 DIAGNOSIS — R55 Syncope and collapse: Secondary | ICD-10-CM

## 2012-12-07 DIAGNOSIS — E785 Hyperlipidemia, unspecified: Secondary | ICD-10-CM

## 2012-12-07 NOTE — Assessment & Plan Note (Signed)
Status post admission to the hospital, telemetry. Workup was negative.She feels well. We agreed not to proceed with any further workup for now. She will call if problems

## 2012-12-07 NOTE — Assessment & Plan Note (Addendum)
A1c @ recent admission 5.9, patient aware, she has pre- diabetes.  Rx-- diet and exercise which were discussed

## 2012-12-07 NOTE — Progress Notes (Signed)
  Subjective:    Patient ID: Angela Clay, female    DOB: 05-05-1934, 79 y.o.   MRN: [id number]  [facility] followup Admitted 12/01/2012 after  a syncope, felt to be vasovagal, at the time was quite nauseated; had no CP-palpitation-HA before the event; no b/b incontinence, no post tictal Chart reviewed: Brain MRI showed no acute infarct, x-rays of the lumbar spine showed DJD. Echocardiogram showed a grade 1 diastolic dysfunction. Carotid ultrasound negative. BMP , cholesterol, CBC satisfactory. A1c 5.9 EKG-- normal sinus rhythm  Past Medical History  Diagnosis Date  . GERD (gastroesophageal reflux disease)     EGD for dysphagia 2000 aprox (-)  . Hyperlipidemia   . Osteopenia   . Hypertension   . Lymphoma     neck,mlig,unspecified site dx 1970s  . Hypothyroidism   . OA (osteoarthritis)     severe at neck  . Syncope 11-2012    admited . w/u (-)   Past Surgical History  Procedure Laterality Date  . Tubal ligation  1972  . Cholecystectomy  02/24/06  . Appendectomy    . Thyroidectomy    . Tonsillectomy and adenoidectomy  1949      Review of Systems Since he left the hospital she is feeling well. Medications were not change. Denies currently any headaches, chest pain, dizziness or neck pain. She is a slightly sore at her buttocks but pain is improving    Objective:   Physical Exam General -- alert, well-developed Lungs -- normal respiratory effort, no intercostal retractions, no accessory muscle use, and normal breath sounds.   Heart-- normal rate, regular rhythm, no murmur, and no gallop.   Extremities-- no pretibial edema bilaterally Neurologic-- alert & oriented X3, speech clear and fluent, memory intact, motor exam symmetric. Face symmetric. Psych-- Cognition and judgment appear intact. Alert and cooperative with normal attention span and concentration.  not anxious appearing and not depressed appearing.      Assessment & Plan:  Today , I spent more than 25 min  with the patient, >50% of the time counseling, and  reviewing the chart

## 2012-12-07 NOTE — Assessment & Plan Note (Signed)
Patient desires to go back to a higher dose of Lipitor ---> 40 mg. I agree

## 2012-12-08 ENCOUNTER — Ambulatory Visit: Payer: Medicare Other | Admitting: Internal Medicine

## 2012-12-09 ENCOUNTER — Encounter: Payer: Self-pay | Admitting: Internal Medicine

## 2012-12-09 NOTE — Assessment & Plan Note (Signed)
U/s 12-02-12: Bilateral: mild soft plaque CCA and origin ICA. No significant ICA stenosis. Vertebral artery flow is antegrade. Tortuous vessels noted. Unable to duplicate velocities from study done at [facility] 12/13.

## 2013-03-04 ENCOUNTER — Encounter: Payer: Self-pay | Admitting: Internal Medicine

## 2013-03-07 ENCOUNTER — Encounter: Payer: Self-pay | Admitting: Internal Medicine

## 2013-03-07 MED ORDER — HYDROCHLOROTHIAZIDE 25 MG PO TABS: 25.0000 mg | ORAL_TABLET | Freq: Every day | ORAL | Status: DC

## 2013-03-07 MED ORDER — LEVOTHYROXINE SODIUM 100 MCG PO TABS: 100.0000 ug | ORAL_TABLET | Freq: Every day | ORAL | Status: DC

## 2013-03-07 MED ORDER — ATORVASTATIN CALCIUM 40 MG PO TABS: 40.0000 mg | ORAL_TABLET | Freq: Every day | ORAL | Status: DC

## 2013-03-07 NOTE — Telephone Encounter (Signed)
meds filled

## 2013-03-10 ENCOUNTER — Ambulatory Visit (INDEPENDENT_AMBULATORY_CARE_PROVIDER_SITE_OTHER): Payer: Medicare Other | Admitting: Internal Medicine

## 2013-03-10 ENCOUNTER — Encounter: Payer: Self-pay | Admitting: Internal Medicine

## 2013-03-10 VITALS — BP 128/74 | HR 75 | Temp 98.1°F | Wt 170.0 lb

## 2013-03-10 DIAGNOSIS — M13141 Monoarthritis, not elsewhere classified, right hand: Secondary | ICD-10-CM

## 2013-03-10 DIAGNOSIS — M13149 Monoarthritis, not elsewhere classified, unspecified hand: Secondary | ICD-10-CM | POA: Insufficient documentation

## 2013-03-10 MED ORDER — DOXYCYCLINE HYCLATE 100 MG PO TABS: 100.0000 mg | ORAL_TABLET | Freq: Two times a day (BID) | ORAL | Status: DC

## 2013-03-10 NOTE — Assessment & Plan Note (Addendum)
R index PIP inflammation, by definition a monoarthritis. Doubt cellulitis is the primary issue Plan:  Recommend to see ortho  today, x-ray. I gave her a prescription for doxycycline but asked her to hold that until see she sees orthopedic surgery.

## 2013-03-10 NOTE — Progress Notes (Signed)
  Subjective:    Patient ID: Angela Clay, female    DOB: [birth year], 79 y.o.   MRN: [id number]  HPI Acute visit. Developed pain, redness and swelling at the right index 2 days ago. This morning symptoms are slightly better but not gone. She has no history of gout.  Past Medical History  Diagnosis Date  . GERD (gastroesophageal reflux disease)     EGD for dysphagia 2000 aprox (-)  . Hyperlipidemia   . Osteopenia   . Hypertension   . Lymphoma     neck,mlig,unspecified site dx 1970s  . Hypothyroidism   . OA (osteoarthritis)     severe at neck  . Syncope 11-2012    admited . w/u (-)   Past Surgical History  Procedure Laterality Date  . Tubal ligation  1972  . Cholecystectomy  02/24/06  . Appendectomy    . Thyroidectomy    . Tonsillectomy and adenoidectomy  1949    Review of Systems Subjective fever?. Denies any finger injury, open wound or discharge.     Objective:   Physical Exam BP 128/74  Pulse 75  Temp(Src) 98.1 F (36.7 C) (Oral)  Wt 170 lb (77.111 kg)  BMI 29.17 kg/m2  SpO2 94%  General -- alert, well-developed, NAD   HEENT --  No lesions in the conjuntiva Heart-- normal rate, regular rhythm, no murmur, and no gallop.     Extremities--  Left hand and wrist normal. Right hand and wrist: Normal except for the index PIP which is swollen, red, warm and has a decreased ROM. The redness-warmness extend   proximately to the knuckle but  the knuckle itself is not involved. No nail lesions Neurologic-- alert & oriented X3 and strength normal in all extremities. Psych-- Cognition and judgment appear intact. Alert and cooperative with normal attention span and concentration.  not anxious appearing and not depressed appearing.      Assessment & Plan:

## 2013-03-10 NOTE — Patient Instructions (Addendum)
Please get your x-ray at the other [place]  office located at: [place], across from [facility].  Please go to the basement, this is a walk-in facility, they are open from 8:30 to 5:30 PM. Phone number [phone].

## 2013-03-11 ENCOUNTER — Ambulatory Visit (INDEPENDENT_AMBULATORY_CARE_PROVIDER_SITE_OTHER): Payer: Medicare Other | Admitting: Obstetrics & Gynecology

## 2013-03-11 ENCOUNTER — Encounter: Payer: Self-pay | Admitting: Obstetrics & Gynecology

## 2013-03-11 VITALS — BP 137/76 | HR 74 | Temp 97.7°F | Ht 65.0 in | Wt 170.0 lb

## 2013-03-11 DIAGNOSIS — Z01419 Encounter for gynecological examination (general) (routine) without abnormal findings: Secondary | ICD-10-CM

## 2013-03-11 DIAGNOSIS — N819 Female genital prolapse, unspecified: Secondary | ICD-10-CM | POA: Insufficient documentation

## 2013-03-11 DIAGNOSIS — N904 Leukoplakia of vulva: Secondary | ICD-10-CM

## 2013-03-11 NOTE — Patient Instructions (Addendum)
Patient information: Pelvic organ prolapse (The Basics)View in SpanishWritten by the doctors and editors at UpToDate  What is pelvic organ prolapse? - Pelvic organ prolapse is a condition that only affects women. It happens when tissues that support the organs in the lower belly relax. As a result, the organs drop down and press against or bulge into the vagina. If the bladder bulges into the vagina, doctors call this problem "cystocele." If the rectum bulges into the vagina, they call it "rectocele." Uterine prolapse means the uterus has bulged into the vagina (figure 1). What are the symptoms of pelvic organ prolapse? - Many women with this problem have no symptoms. But some women with pelvic organ prolapse have symptoms that include:  ?Fullness or pressure in the pelvis or vagina ?A bulge in the vagina or coming out of the vagina ?Leaking urine when they laugh, cough, or sneeze ?Needing to urinate all of a sudden When they use the toilet, some women need to press on the bulge in the vagina with a finger to get out all their urine or to finish a bowel movement.  Is there a test for pelvic organ prolapse? - Your doctor or nurse will be able to tell if you have it by doing a pelvic exam.  Is there anything I can do on my own to feel better? - Yes. Some women feel better if they do pelvic muscle exercises. These exercises strengthen the muscles that control the flow of urine and bowel movements. They are also known as "Kegel" exercises. Your nurse or doctor can teach you how to do them.  How is pelvic organ prolapse treated? - Women who have no symptoms or who are not bothered by their symptoms do not need treatment. For women with symptoms that bother them, doctors suggest different treatments, including:  ?A vaginal pessary - This device fits inside a woman's vagina to support the bladder and push it back into place. Pessaries come in different shapes and sizes. ?Surgery - A surgeon can move dropped  organs back where they belong and strengthen the tissues that keep them in place. Women should have this type of surgery only if they are done having children.  Can pelvic organ prolapse be prevented? - You can reduce your chances of pelvic organ prolapse if you: ?Lose weight if you are overweight ?Get treated for constipation if you are constipated ?Avoid activities that require you to lift heavy things

## 2013-03-11 NOTE — Progress Notes (Signed)
.   Subjective:     Angela Clay is a 79 y.o. female here for a routine exam.  No current complaints.  Personal health questionnaire reviewed: yes.   Gynecologic History No LMP recorded. Patient is postmenopausal. Contraception: none Last Pap: 02/2011. Results were: normal Last mammogram: 07/2012. Results were: normal  Obstetric History OB History   Grav Para Term Preterm Abortions TAB SAB Ect Mult Living                   The following portions of the patient's history were reviewed and updated as appropriate: allergies, current medications, past family history, past medical history, past social history, past surgical history and problem list.  Review of Systems Pertinent items are noted in HPI.    Objective:    General appearance: alert Breasts: normal appearance, no masses or tenderness Abdomen: soft, non-tender; bowel sounds normal; no masses,  no organomegaly Pelvic: cervix normal in appearance, external genitalia atrophic; small loss of architecture, moderate cystocele, no adnexal masses or tenderness, uterus normal size--minimal prolapse, vagina normal without discharge    Assessment:    Vulvar dystrophy stable POP   Plan:   Counseled re: Kegel's exercises Management options reviewed Return prn

## 2013-03-29 ENCOUNTER — Encounter: Payer: Self-pay | Admitting: Internal Medicine

## 2013-03-29 ENCOUNTER — Ambulatory Visit (INDEPENDENT_AMBULATORY_CARE_PROVIDER_SITE_OTHER): Payer: Medicare Other | Admitting: Internal Medicine

## 2013-03-29 VITALS — BP 122/84 | HR 81 | Temp 98.2°F | Wt 171.0 lb

## 2013-03-29 DIAGNOSIS — M13141 Monoarthritis, not elsewhere classified, right hand: Secondary | ICD-10-CM

## 2013-03-29 DIAGNOSIS — M13149 Monoarthritis, not elsewhere classified, unspecified hand: Secondary | ICD-10-CM

## 2013-03-29 DIAGNOSIS — R55 Syncope and collapse: Secondary | ICD-10-CM

## 2013-03-29 DIAGNOSIS — E785 Hyperlipidemia, unspecified: Secondary | ICD-10-CM

## 2013-03-29 DIAGNOSIS — I10 Essential (primary) hypertension: Secondary | ICD-10-CM

## 2013-03-29 LAB — BASIC METABOLIC PANEL
BUN: 14 mg/dL (ref 6–23)
CO2: 28 mEq/L (ref 19–32)
Calcium: 9.6 mg/dL (ref 8.4–10.5)
Chloride: 105 mEq/L (ref 96–112)
Creatinine, Ser: 0.9 mg/dL (ref 0.4–1.2)
GFR: 66.17 mL/min (ref >60.00)
Glucose, Bld: 126 mg/dL — ABNORMAL HIGH (ref 70–99)
Potassium: 3.4 mEq/L — ABNORMAL LOW (ref 3.5–5.1)
Sodium: 141 mEq/L (ref 135–145)

## 2013-03-29 NOTE — Assessment & Plan Note (Signed)
Good medication compliance, will check FLP on return to the office

## 2013-03-29 NOTE — Patient Instructions (Addendum)
Come back in 4 to 5 months, fasting for a yearly check up

## 2013-03-29 NOTE — Assessment & Plan Note (Signed)
Controlled, check a BMP

## 2013-03-29 NOTE — Progress Notes (Signed)
  Subjective:    Patient ID: Angela Clay, female    DOB: May 02, 1934, 79 y.o.   MRN: [id number]  HPI ROV Finger infection, saw ortho, diagnosed with cellulitis, getting better but that range of motion is not completely back to normal.  Past Medical History  Diagnosis Date  . GERD (gastroesophageal reflux disease)     EGD for dysphagia 2000 aprox (-)  . Hyperlipidemia   . Osteopenia   . Hypertension   . Lymphoma     neck,mlig,unspecified site dx 1970s  . Hypothyroidism   . OA (osteoarthritis)     severe at neck  . Syncope 11-2012    admited . w/u (-)   Past Surgical History  Procedure Laterality Date  . Tubal ligation  1972  . Cholecystectomy  02/24/06  . Appendectomy    . Thyroidectomy    . Tonsillectomy and adenoidectomy  1949      Review of Systems Medication list reviewed, good compliance. Takes Aleve very rarely. Has a sore place on the left foot that needs to be checked. had as syncope 11/2012, no further episodes.    Objective:   Physical Exam  General -- alert, well-developed,NAD   Lungs -- normal respiratory effort, no intercostal retractions, no accessory muscle use, and normal breath sounds.   Heart-- normal rate, regular rhythm, no murmur, and no gallop.   Extremities--  no pretibial edema bilaterally R index: no redness or warmness, mild swelling and range of motion of the PIP still slightly decreased. Improved compared to previous visit. L foot: has a small plantar wart distally Neurologic-- alert & oriented X3 and strength normal in all extremities. Psych-- Cognition and judgment appear intact. Alert and cooperative with normal attention span and concentration.  not anxious appearing and not depressed appearing.        Assessment & Plan:  Plantar's wart: Plans to see dermatology.

## 2013-03-29 NOTE — Assessment & Plan Note (Signed)
No further episodes

## 2013-03-29 NOTE — Assessment & Plan Note (Signed)
Saw orthopedic surgery,dx w/  cellulitis, right hand, improving. PIP range of motion is slightly decreased, plans to call orthopedic surgery if not completely well.

## 2013-08-17 ENCOUNTER — Encounter: Payer: Self-pay | Admitting: Internal Medicine

## 2013-08-30 LAB — HM MAMMOGRAPHY: HM Mammogram: NORMAL

## 2013-09-01 ENCOUNTER — Other Ambulatory Visit: Payer: Self-pay

## 2013-09-02 ENCOUNTER — Other Ambulatory Visit: Payer: Self-pay | Admitting: *Deleted

## 2013-09-02 MED ORDER — HYDROCHLOROTHIAZIDE 25 MG PO TABS: 25.0000 mg | ORAL_TABLET | Freq: Every day | ORAL | Status: DC

## 2013-09-02 MED ORDER — LEVOTHYROXINE SODIUM 100 MCG PO TABS: 100.0000 ug | ORAL_TABLET | Freq: Every day | ORAL | Status: DC

## 2013-09-02 MED ORDER — OMEPRAZOLE 20 MG PO CPDR: 20.0000 mg | DELAYED_RELEASE_CAPSULE | Freq: Every day | ORAL | Status: DC | PRN

## 2013-09-02 NOTE — Telephone Encounter (Signed)
HCTZ refill sent to pharmacy

## 2013-09-02 NOTE — Telephone Encounter (Signed)
Levothyroxine refill sent to pharmacy

## 2013-09-02 NOTE — Telephone Encounter (Signed)
Omeprazole refill sent to pharmacy

## 2013-09-06 ENCOUNTER — Encounter: Payer: Self-pay | Admitting: Obstetrics & Gynecology

## 2013-09-06 ENCOUNTER — Encounter: Payer: Self-pay | Admitting: Internal Medicine

## 2013-09-08 ENCOUNTER — Other Ambulatory Visit: Payer: Self-pay | Admitting: *Deleted

## 2013-09-08 MED ORDER — ATORVASTATIN CALCIUM 40 MG PO TABS: 40.0000 mg | ORAL_TABLET | Freq: Every day | ORAL | Status: DC

## 2013-09-08 NOTE — Telephone Encounter (Signed)
Atorvastatin refilled.  

## 2013-10-18 ENCOUNTER — Ambulatory Visit (INDEPENDENT_AMBULATORY_CARE_PROVIDER_SITE_OTHER): Payer: Medicare Other

## 2013-10-18 DIAGNOSIS — Z23 Encounter for immunization: Secondary | ICD-10-CM

## 2013-11-01 ENCOUNTER — Ambulatory Visit (HOSPITAL_COMMUNITY): Payer: Medicare HMO | Attending: Cardiology

## 2013-11-01 ENCOUNTER — Encounter: Payer: Self-pay | Admitting: Cardiology

## 2013-11-01 DIAGNOSIS — I658 Occlusion and stenosis of other precerebral arteries: Secondary | ICD-10-CM | POA: Insufficient documentation

## 2013-11-01 DIAGNOSIS — I6529 Occlusion and stenosis of unspecified carotid artery: Secondary | ICD-10-CM

## 2013-11-01 DIAGNOSIS — E785 Hyperlipidemia, unspecified: Secondary | ICD-10-CM | POA: Insufficient documentation

## 2013-11-01 DIAGNOSIS — I10 Essential (primary) hypertension: Secondary | ICD-10-CM | POA: Insufficient documentation

## 2013-11-09 ENCOUNTER — Other Ambulatory Visit: Payer: Self-pay | Admitting: *Deleted

## 2013-11-09 ENCOUNTER — Telehealth: Payer: Self-pay | Admitting: *Deleted

## 2013-11-09 MED ORDER — OSELTAMIVIR PHOSPHATE 75 MG PO CAPS: 75.0000 mg | ORAL_CAPSULE | Freq: Every day | ORAL | Status: DC

## 2013-11-09 NOTE — Telephone Encounter (Signed)
Call Tamiflu 75 mg one tablet by mouth daily for 10 days, no RF . Call 1 prescription for each of the patients

## 2013-11-09 NOTE — Telephone Encounter (Signed)
Tamiflu script for both patients sent to [facility] pharmacy. Called and made patient aware. JG//CMA

## 2013-11-09 NOTE — Telephone Encounter (Signed)
Patient called and stated that her and her husband, Georgetta Crafton DOB 07/11/32, have both been exposed to the flu and would like a prescription of Tamiflu sent for both of them. Please advise

## 2013-11-24 ENCOUNTER — Encounter: Payer: Self-pay | Admitting: Internal Medicine

## 2013-11-25 ENCOUNTER — Other Ambulatory Visit: Payer: Self-pay | Admitting: *Deleted

## 2013-11-25 MED ORDER — HYDROCHLOROTHIAZIDE 25 MG PO TABS: 25.0000 mg | ORAL_TABLET | Freq: Every day | ORAL | Status: DC

## 2013-11-25 MED ORDER — LEVOTHYROXINE SODIUM 100 MCG PO TABS: 100.0000 ug | ORAL_TABLET | Freq: Every day | ORAL | Status: DC

## 2013-11-25 MED ORDER — ATORVASTATIN CALCIUM 40 MG PO TABS: 40.0000 mg | ORAL_TABLET | Freq: Every day | ORAL | Status: DC

## 2013-12-16 ENCOUNTER — Telehealth: Payer: Self-pay

## 2013-12-16 NOTE — Telephone Encounter (Addendum)
Left message with spouse for call back   Medication and allergies:  Reviewed and updated  90 day supply/mail order: na Local pharmacy: [facility] on [address]   Immunizations due:  UTD  A/P:   No changes to FH, PSH or Personal Hx Tdap--08/2008 Flu vaccine--09/2013 PNA--07/2007 Shingles--02/2013 [facility]--10/2012--Dr Brodie--benign polyp--no recall needed [facility]--08/2013--[facility]--Benign Findings  To Discuss with Provider: Not at this time

## 2013-12-20 ENCOUNTER — Encounter: Payer: Self-pay | Admitting: Internal Medicine

## 2013-12-20 ENCOUNTER — Encounter: Payer: Medicare HMO | Admitting: Internal Medicine

## 2013-12-30 ENCOUNTER — Telehealth: Payer: Self-pay

## 2013-12-30 NOTE — Telephone Encounter (Signed)
Medication and allergies: Reviewed and updated   90 day supply/mail order: na  Local pharmacy: [facility] on [address]   Immunizations due: UTD   A/P:  No changes to FH, PSH or Personal Hx  Tdap--08/2008  Flu vaccine--09/2013  PNA--07/2007  Shingles--02/2013  [facility]--10/2012--Dr Brodie--benign polyp--no recall needed  [facility]--08/2013--[facility]--Benign Findings   To Discuss with Provider:  Not at this time

## 2014-01-02 ENCOUNTER — Encounter: Payer: Self-pay | Admitting: Internal Medicine

## 2014-01-02 ENCOUNTER — Ambulatory Visit (INDEPENDENT_AMBULATORY_CARE_PROVIDER_SITE_OTHER): Payer: Medicare HMO | Admitting: Internal Medicine

## 2014-01-02 VITALS — BP 102/65 | HR 73 | Temp 98.3°F | Ht 64.5 in | Wt 173.0 lb

## 2014-01-02 DIAGNOSIS — I10 Essential (primary) hypertension: Secondary | ICD-10-CM

## 2014-01-02 DIAGNOSIS — Z0000 Encounter for general adult medical examination without abnormal findings: Secondary | ICD-10-CM

## 2014-01-02 DIAGNOSIS — Z23 Encounter for immunization: Secondary | ICD-10-CM

## 2014-01-02 DIAGNOSIS — R739 Hyperglycemia, unspecified: Secondary | ICD-10-CM

## 2014-01-02 DIAGNOSIS — M899 Disorder of bone, unspecified: Secondary | ICD-10-CM

## 2014-01-02 DIAGNOSIS — R7309 Other abnormal glucose: Secondary | ICD-10-CM

## 2014-01-02 DIAGNOSIS — M949 Disorder of cartilage, unspecified: Secondary | ICD-10-CM

## 2014-01-02 DIAGNOSIS — E039 Hypothyroidism, unspecified: Secondary | ICD-10-CM

## 2014-01-02 DIAGNOSIS — E559 Vitamin D deficiency, unspecified: Secondary | ICD-10-CM

## 2014-01-02 DIAGNOSIS — E785 Hyperlipidemia, unspecified: Secondary | ICD-10-CM

## 2014-01-02 LAB — COMPREHENSIVE METABOLIC PANEL
ALT: 24 U/L (ref 0–35)
AST: 23 U/L (ref 0–37)
Albumin: 4.4 g/dL (ref 3.5–5.2)
Alkaline Phosphatase: 74 U/L (ref 39–117)
BUN: 18 mg/dL (ref 6–23)
CO2: 29 mEq/L (ref 19–32)
Calcium: 10.7 mg/dL — ABNORMAL HIGH (ref 8.4–10.5)
Chloride: 109 mEq/L (ref 96–112)
Creatinine, Ser: 1.1 mg/dL (ref 0.4–1.2)
GFR: 49.48 mL/min — ABNORMAL LOW (ref >60.00)
Glucose, Bld: 114 mg/dL — ABNORMAL HIGH (ref 70–99)
Potassium: 4.2 mEq/L (ref 3.5–5.1)
Sodium: 151 mEq/L — ABNORMAL HIGH (ref 135–145)
Total Bilirubin: 0.8 mg/dL (ref 0.3–1.2)
Total Protein: 8.1 g/dL (ref 6.0–8.3)

## 2014-01-02 LAB — LIPID PANEL
Cholesterol: 180 mg/dL (ref 0–200)
HDL: 63.7 mg/dL (ref >39.00)
LDL Cholesterol: 80 mg/dL (ref 0–99)
Total CHOL/HDL Ratio: 3
Triglycerides: 180 mg/dL — ABNORMAL HIGH (ref 0.0–149.0)
VLDL: 36 mg/dL (ref 0.0–40.0)

## 2014-01-02 LAB — HEMOGLOBIN A1C: Hgb A1c MFr Bld: 6 % (ref 4.6–6.5)

## 2014-01-02 LAB — TSH: TSH: 7.14 u[IU]/mL — ABNORMAL HIGH (ref 0.35–5.50)

## 2014-01-02 NOTE — Assessment & Plan Note (Signed)
Good compliance of medication, labs

## 2014-01-02 NOTE — Progress Notes (Signed)
Subjective:    Patient ID: Angela Clay, female    DOB: 04/25/1934, 80 y.o.   MRN: [id number]  DOS:  01/02/2014 Type of  visit: Here for Medicare AWV:   1. Risk factors based on Past M, S, F history: reviewed   2. Physical Activities: active, home chores, active at church, silver snickers x3/week   3. Depression/mood: No problems noted or reported   4. Hearing: Very good, no problems w/ normal conversation   5. ADL's: independent, drives  6. Fall Risk: no recent falls, prevention discussed 7. Home Safety: does feelsafe at home   8. Height, weight, &visual acuity: see VS, due for a visit, plans to see the eye doctor   9. Counseling: provided   10. Labs ordered based on risk factors: yes   11. Referral Coordination: if needed   12. Care Plan, see a/p   13. Cognitive Assessment: memory and cognition and motor skills seem appropiate   in addition, we discussed the following: HTN--BP is slightly low today, at home is usually 120/80. DJD, takes occasional Aleve. High cholesterol, good medication compliance. Hypothyroidism, good medication compliance. Osteopenia , takes regularly Ca and   vitamin D supplements.     ROS No  CP, SOB No palpitations, no lower extremity edema No orthopnea , DOE Denies  nausea, vomiting diarrhea Denies  blood in the stools (-) cough, sputum production (-) wheezing, chest congestion No dysuria, gross hematuria, difficulty urinating    Past Medical History  Diagnosis Date  . GERD (gastroesophageal reflux disease)     EGD for dysphagia 2000 aprox (-)  . Hyperlipidemia   . Osteopenia   . Hypertension   . Lymphoma     neck,mlig,unspecified site dx 1970s  . Hypothyroidism   . OA (osteoarthritis)     severe at neck  . Syncope 11-2012    admited . w/u (-)    Past Surgical History  Procedure Laterality Date  . Tubal ligation  1972  . Cholecystectomy  02/24/06  . Appendectomy    . Thyroidectomy    . Tonsillectomy and adenoidectomy  1949     History   Social History  . Marital Status: Married    Spouse Name: N/A    Number of Children: 3  . Years of Education: N/A   Occupational History  . retired    Social History Main Topics  . Smoking status: Never Smoker   . Smokeless tobacco: Never Used  . Alcohol Use: No  . Drug Use: No  . Sexual Activity: Not on file   Other Topics Concern  . Not on file   Social History Narrative   3 kids, 8 GK           Family History  Problem Relation Age of Onset  . Diabetes Neg Hx   . Colon cancer Neg Hx   . Breast cancer Neg Hx   . Stroke Mother 66  . Coronary artery disease Father     MI age 74  . Deep vein thrombosis Brother     PE at age 24  . Heart failure Sister        Medication List       This list is accurate as of: 01/02/14  5:54 PM.  Always use your most recent med list.               acetaminophen 500 MG tablet  Commonly known as:  TYLENOL  Take 500 mg by mouth every 6 (  six) hours as needed. For pain     ALEVE PO  Take by mouth as needed.     aspirin 81 MG tablet  Take 81 mg by mouth daily.     atorvastatin 40 MG tablet  Commonly known as:  LIPITOR  Take 1 tablet (40 mg total) by mouth at bedtime.     calcium-vitamin D 250-125 MG-UNIT per tablet  Commonly known as:  OSCAL  Take 1 tablet by mouth.     clobetasol cream 0.05 %  Commonly known as:  TEMOVATE  Apply 1 application topically 3 (three) times a week.     hydrochlorothiazide 25 MG tablet  Commonly known as:  HYDRODIURIL  Take 1 tablet (25 mg total) by mouth daily.     levothyroxine 100 MCG tablet  Commonly known as:  SYNTHROID, LEVOTHROID  Take 1 tablet (100 mcg total) by mouth daily.     omeprazole 20 MG capsule  Commonly known as:  PRILOSEC  Take 1 capsule (20 mg total) by mouth daily as needed. For acid reflux           Objective:   Physical Exam BP 102/65  Pulse 73  Temp(Src) 98.3 F (36.8 C)  Ht 5' 4.5" (1.638 m)  Wt 173 lb (78.472 kg)  BMI 29.25 kg/m2   SpO2 98% General -- alert, well-developed, NAD.  Neck --no thyromegaly  HEENT-- Not pale.   Lungs -- normal respiratory effort, no intercostal retractions, no accessory muscle use, and normal breath sounds.  Heart-- normal rate, regular rhythm, no murmur.  Abdomen-- Not distended, good bowel sounds,soft, non-tender.  Extremities-- no pretibial edema bilaterally  Neurologic--  alert & oriented X3. Speech normal, gait normal, strength normal in all extremities.  Psych-- Cognition and judgment appear intact. Cooperative with normal attention span and concentration. No anxious or depressed appearing.        Assessment & Plan:

## 2014-01-02 NOTE — Assessment & Plan Note (Signed)
Labs , Continue with her healthy lifestyle

## 2014-01-02 NOTE — Assessment & Plan Note (Signed)
On calcium vitamin D, see previous entries, due for a bone density test

## 2014-01-02 NOTE — Assessment & Plan Note (Signed)
Seems  well-controlled, see history of present illness. Labs

## 2014-01-02 NOTE — Assessment & Plan Note (Addendum)
Td  11-09 Pneumina shot 2008, PNM shot 13 today  shingles shot  02-2013  Had a flu shot  [facility]--10/2012--Dr Brodie--benign polyp--no recall needed     gynecology 02-2011, vulvar biopsy was negative.  Las visit to gyn 02-2013 : had a breast and vulvar exam Plans to call gyn for a f/u [facility]--08/2013--[facility]--Benign Findings   Encouraged to cont healthy life style

## 2014-01-02 NOTE — Assessment & Plan Note (Signed)
Good compliance with Lipitor, check labs

## 2014-01-02 NOTE — Patient Instructions (Signed)
Get your blood work before you leave    Next visit is for routine check up regards your  blood pressure ,  thyroid  in 6 months  No need to come back fasting Please make an appointment       Fall Prevention and [place] cause injuries and can affect all age groups. It is possible to use preventive measures to significantly decrease the likelihood of falls. There are many simple measures which can make your home safer and prevent falls. OUTDOORS  Repair cracks and edges of walkways and driveways.  Remove high doorway thresholds.  Trim shrubbery on the main path into your home.  Have good outside lighting.  Clear walkways of tools, rocks, debris, and clutter.  Check that handrails are not broken and are securely fastened. Both sides of steps should have handrails.  Have leaves, snow, and ice cleared regularly.  Use sand or salt on walkways during winter months.  In the garage, clean up grease or oil spills. BATHROOM  Install night lights.  Install grab bars by the toilet and in the tub and shower.  Use non-skid mats or decals in the tub or shower.  Place a plastic non-slip stool in the shower to sit on, if needed.  Keep floors dry and clean up all water on the floor immediately.  Remove soap buildup in the tub or shower on a regular basis.  Secure bath mats with non-slip, double-sided rug tape.  Remove throw rugs and tripping hazards from the floors. BEDROOMS  Install night lights.  Make sure a bedside light is easy to reach.  Do not use oversized bedding.  Keep a telephone by your bedside.  Have a firm chair with side arms to use for getting dressed.  Remove throw rugs and tripping hazards from the floor. KITCHEN  Keep handles on pots and pans turned toward the center of the stove. Use back burners when possible.  Clean up spills quickly and allow time for drying.  Avoid walking on wet floors.  Avoid hot utensils and knives.  Position  shelves so they are not too high or low.  Place commonly used objects within easy reach.  If necessary, use a sturdy step stool with a grab bar when reaching.  Keep electrical cables out of the way.  Do not use floor polish or wax that makes floors slippery. If you must use wax, use non-skid floor wax.  Remove throw rugs and tripping hazards from the floor. STAIRWAYS  Never leave objects on stairs.  Place handrails on both sides of stairways and use them. Fix any loose handrails. Make sure handrails on both sides of the stairways are as long as the stairs.  Check carpeting to make sure it is firmly attached along stairs. Make repairs to worn or loose carpet promptly.  Avoid placing throw rugs at the top or bottom of stairways, or properly secure the rug with carpet tape to prevent slippage. Get rid of throw rugs, if possible.  Have an electrician put in a light switch at the top and bottom of the stairs. OTHER FALL PREVENTION TIPS  Wear low-heel or rubber-soled shoes that are supportive and fit well. Wear closed toe shoes.  When using a stepladder, make sure it is fully opened and both spreaders are firmly locked. Do not climb a closed stepladder.  Add color or contrast paint or tape to grab bars and handrails in your home. Place contrasting color strips on first and last steps.  Learn and use mobility aids as needed. Install an electrical emergency response system.  Turn on lights to avoid dark areas. Replace light bulbs that burn out immediately. Get light switches that glow.  Arrange furniture to create clear pathways. Keep furniture in the same place.  Firmly attach carpet with non-skid or double-sided tape.  Eliminate uneven floor surfaces.  Select a carpet pattern that does not visually hide the edge of steps.  Be aware of all pets. OTHER HOME SAFETY TIPS  Set the water temperature for 120 F (48.8 C).  Keep emergency numbers on or near the telephone.  Keep  smoke detectors on every level of the home and near sleeping areas. Document Released: 10/03/2002 Document Revised: 04/13/2012 Document Reviewed: 01/02/2012 [facility] Patient Information 2014 [place].

## 2014-01-03 ENCOUNTER — Telehealth: Payer: Self-pay | Admitting: Internal Medicine

## 2014-01-03 LAB — VITAMIN D 25 HYDROXY (VIT D DEFICIENCY, FRACTURES): Vit D, 25-Hydroxy: 57 ng/mL (ref 30–89)

## 2014-01-03 NOTE — Telephone Encounter (Signed)
Relevant patient education assigned to patient using Emmi. ° °

## 2014-01-05 MED ORDER — LEVOTHYROXINE SODIUM 125 MCG PO TABS: 125.0000 ug | ORAL_TABLET | Freq: Every day | ORAL | Status: DC

## 2014-01-05 NOTE — Addendum Note (Signed)
Addended by: Peggyann Shoals on: 01/05/2014 10:40 AM   Modules accepted: Orders

## 2014-01-23 ENCOUNTER — Ambulatory Visit (INDEPENDENT_AMBULATORY_CARE_PROVIDER_SITE_OTHER): Payer: Medicare HMO | Admitting: Sports Medicine

## 2014-01-23 ENCOUNTER — Encounter: Payer: Self-pay | Admitting: Sports Medicine

## 2014-01-23 VITALS — BP 144/83 | Ht 64.0 in | Wt 174.0 lb

## 2014-01-23 DIAGNOSIS — M25569 Pain in unspecified knee: Secondary | ICD-10-CM

## 2014-01-23 NOTE — Progress Notes (Signed)
   Subjective:    Patient ID: Angela Clay, female    DOB: 04/25/1934, 80 y.o.   MRN: [id number]  HPI chief complaint: Right knee pain  Very pleasant 80 year old female comes in today complaining of 3 weeks of anterior right knee pain. Pain began acutely after she was coming down some stairs after working out. She localizes her pain directly over the patellar tendon. It is worse with squatting and lowering her lower leg from a fully extended position. She denies pain elsewhere in the knee. She has not noticed any swelling. No pain with simple walking. No mechanical symptoms. No feelings of instability. No problems with his knee in the past. Her pain was improving until last night. She has tried ice and heat. She has also used over-the-counter ibuprofen with minimal pain relief. No prior knee surgeries. No pain more proximally in the groin.  Past medical history is reviewed Medications are reviewed Allergies are reviewed    Review of Systems as above     Objective:   Physical Exam Well-developed, well-nourished. No acute distress. Awake alert and oriented x3. Vital signs reviewed.  Right knee: Full range of motion. No effusion. Trace patellofemoral crepitus. Patient has pain that she localizes to the patellar tendon when going from a fully extended position to actively flexing the knee. No tenderness to palpation directly over the tendon nor at the patellar insertion. No tenderness along the medial or lateral joint lines. Negative McMurray's. Good joint stability. Mild valgus thrust with standing. Neurovascularly intact distally. Walking without significant limp.  MSK ultrasound of the right knee: Limited images were obtained. No obvious effusion. There is a hypoechoic change seen in the proximal medial patellar tendon at its origin. This is seen on both long and short views. Patellar tendon does not appear to be markedly enlarged however. No obvious spurring. Findings consistent with a  probable partial patellar tendon tear.       Assessment & Plan:  1. Right knee pain with ultrasound evidence of probable partial patellar tendon tear  Patient will try some over-the-counter Aspercreme as directed. Patellar strap to wear with activity. Home exercise program consisting of decline squats. She will avoid lunges and squats in the gym but otherwise can increase activity as tolerated. Followup in 2 weeks. Although her presentation would be atypical for osteoarthritis, I would consider the merits of a diagnostic/therapeutic intra-articular cortisone injection at followup if symptoms do not improve. I've asked the patient and her husband to call me with questions or concerns in the interim.

## 2014-01-23 NOTE — Patient Instructions (Signed)
Use your patellar strap when exercising  Buy some Aspercreme and use as directed  Do your exercises daily  Followup with me in 2 weeks if your knee is still hurting

## 2014-02-06 ENCOUNTER — Ambulatory Visit: Payer: Medicare HMO | Admitting: Sports Medicine

## 2014-02-16 ENCOUNTER — Other Ambulatory Visit (INDEPENDENT_AMBULATORY_CARE_PROVIDER_SITE_OTHER): Payer: Medicare HMO

## 2014-02-16 DIAGNOSIS — I10 Essential (primary) hypertension: Secondary | ICD-10-CM

## 2014-02-16 DIAGNOSIS — E039 Hypothyroidism, unspecified: Secondary | ICD-10-CM

## 2014-02-16 LAB — BASIC METABOLIC PANEL
BUN: 17 mg/dL (ref 6–23)
CO2: 29 mEq/L (ref 19–32)
Calcium: 9.4 mg/dL (ref 8.4–10.5)
Chloride: 103 mEq/L (ref 96–112)
Creatinine, Ser: 0.9 mg/dL (ref 0.4–1.2)
GFR: 61.18 mL/min (ref >60.00)
Glucose, Bld: 103 mg/dL — ABNORMAL HIGH (ref 70–99)
Potassium: 3.6 mEq/L (ref 3.5–5.1)
Sodium: 139 mEq/L (ref 135–145)

## 2014-02-16 LAB — TSH: TSH: 0.85 u[IU]/mL (ref 0.35–5.50)

## 2014-02-19 ENCOUNTER — Encounter: Payer: Self-pay | Admitting: Internal Medicine

## 2014-04-10 ENCOUNTER — Encounter: Payer: Self-pay | Admitting: Internal Medicine

## 2014-04-12 ENCOUNTER — Encounter: Payer: Self-pay | Admitting: Internal Medicine

## 2014-04-12 ENCOUNTER — Ambulatory Visit (INDEPENDENT_AMBULATORY_CARE_PROVIDER_SITE_OTHER)
Admission: RE | Admit: 2014-04-12 | Discharge: 2014-04-12 | Disposition: A | Discharge status: Home / Self Care | Payer: Medicare HMO | Source: Ambulatory Visit | Attending: Internal Medicine | Admitting: Internal Medicine

## 2014-04-12 ENCOUNTER — Other Ambulatory Visit (INDEPENDENT_AMBULATORY_CARE_PROVIDER_SITE_OTHER): Payer: Medicare HMO

## 2014-04-12 ENCOUNTER — Other Ambulatory Visit: Payer: Self-pay | Admitting: Internal Medicine

## 2014-04-12 ENCOUNTER — Ambulatory Visit (INDEPENDENT_AMBULATORY_CARE_PROVIDER_SITE_OTHER): Payer: Medicare HMO | Admitting: Internal Medicine

## 2014-04-12 VITALS — BP 126/72 | HR 92 | Temp 98.5°F | Wt 175.2 lb

## 2014-04-12 DIAGNOSIS — R5383 Other fatigue: Secondary | ICD-10-CM

## 2014-04-12 DIAGNOSIS — J3489 Other specified disorders of nose and nasal sinuses: Secondary | ICD-10-CM

## 2014-04-12 DIAGNOSIS — R5381 Other malaise: Secondary | ICD-10-CM

## 2014-04-12 LAB — CBC WITH DIFFERENTIAL/PLATELET
Basophils Absolute: 0 10*3/uL (ref 0.0–0.1)
Basophils Relative: 0.4 % (ref 0.0–3.0)
Eosinophils Absolute: 0.4 10*3/uL (ref 0.0–0.7)
Eosinophils Relative: 3.3 % (ref 0.0–5.0)
HCT: 37.6 % (ref 36.0–46.0)
Hemoglobin: 12.6 g/dL (ref 12.0–15.0)
Lymphocytes Relative: 12.4 % (ref 12.0–46.0)
Lymphs Abs: 1.4 10*3/uL (ref 0.7–4.0)
MCHC: 33.4 g/dL (ref 30.0–36.0)
MCV: 85.7 fl (ref 78.0–100.0)
Monocytes Absolute: 0.7 10*3/uL (ref 0.1–1.0)
Monocytes Relative: 5.9 % (ref 3.0–12.0)
Neutro Abs: 9.1 10*3/uL — ABNORMAL HIGH (ref 1.4–7.7)
Neutrophils Relative %: 78 % — ABNORMAL HIGH (ref 43.0–77.0)
Platelets: 297 10*3/uL (ref 150.0–400.0)
RBC: 4.39 Mil/uL (ref 3.87–5.11)
RDW: 14 % (ref 11.5–15.5)
WBC: 11.7 10*3/uL — ABNORMAL HIGH (ref 4.0–10.5)

## 2014-04-12 MED ORDER — AMOXICILLIN-POT CLAVULANATE 875-125 MG PO TABS: 1.0000 | ORAL_TABLET | Freq: Two times a day (BID) | ORAL | Status: DC

## 2014-04-12 NOTE — Progress Notes (Signed)
   Subjective:    Patient ID: Angela Clay, female    DOB: 1933-12-05, 80 y.o.   MRN: [id number]  HPI Pt had tooth extraction on 03/24/14 of her L upper mandible. She subsequent developed a dry socket and was given a 14 day course of  Amoxicillin that she completed one week ago. The pt reports a foul tasting drainage in her mouth that began 10 days ago. At that time she also began having L maxillary pain and nasal congestion with brown nasal secretions. The pt reported intermittent fevers and reports a Tmax of 99.5 on 04/08/14.Today the pt reports these symptoms have mildly improved but are still present.  The pt was seen by her dentist yesterday and described the above symptoms. The pt reports the dentist did not seem worried and told the pt everything was healing well. The pt states she's had persistent fatigue since this extraction and would like to be seen because she "knows something is wrong."    Review of Systems  HENT: Negative for ear pain, sore throat and trouble swallowing.   Respiratory: Negative for cough and shortness of breath.   Cardiovascular: Negative for chest pain.       Objective:   Physical Exam  L upper tooth missing.  Normal exam       Assessment & Plan:

## 2014-04-12 NOTE — Progress Notes (Signed)
   Subjective:    Patient ID: Angela Clay, female    DOB: [birth year], 80 y.o.   MRN: [id number]  HPI  Pt had  extraction on 03/24/14 of a L upper mandibular tooth.She subsequently developed a dry socket and was given a 14 day course of Amoxicillin that she completed one week ago. The pt reports a foul tasting drainage in her mouth that began 10 days ago. At that time she also began having L maxillary pain and nasal congestion with brown nasal secretions. The pt reported intermittent fevers and reports a Tmax of 99.5 on 04/08/14.Today the pt reports these symptoms have mildly improved but are still present.   The pt was seen by her Dentist yesterday ; he told the pt  that everything was healing well. The pt states she's had persistent fatigue since this extraction.     Review of Systems No fever , chills, or sweats. No sore throat , cough , or sputum production.      Objective:   Physical Exam General appearance:good health ;well nourished; no acute distress or increased work of breathing is present.  No  lymphadenopathy about the head, neck, or axilla noted.   Eyes: No conjunctival inflammation or lid edema is present. There is no scleral icterus.EOMI ; normal vision.  Ears:  External ear exam shows no significant lesions or deformities.  Otoscopic examination reveals clear canals, tympanic membranes are intact bilaterally without bulging, retraction, inflammation or discharge.  Nose:  External nasal examination shows no deformity or inflammation. Nasal mucosa are pink and moist without lesions or exudates. No septal dislocation or deviation.No obstruction to airflow.   Oral exam: Dental hygiene is good; lips and gums are healthy appearing.There is no oropharyngeal erythema or exudate noted.   Neck:  Somewhat thin.No palpable thyroid , masses, or tenderness noted.   Supple with full range of motion without pain.   Heart:  Normal rate and regular rhythm. S1 and S2 normal without gallop,  murmur, click, rub or other extra sounds.   Lungs:Chest clear to auscultation; no wheezes, rhonchi,rales ,or rubs present.No increased work of breathing.    Accentuated curvature of upper thoracic  spine. Extremities:  No cyanosis, edema, or clubbing  noted    Skin: Warm & dry . Splotchy vitiligo of upper extremities.        Assessment & Plan:  #1 L maxillary sinus pain, R/O sinusitis #2 fatigue See orders

## 2014-04-12 NOTE — Patient Instructions (Signed)
Your next office appointment will be determined based upon review of your pending labs & x-rays. Those instructions will be transmitted to you through My Chart .

## 2014-04-12 NOTE — Progress Notes (Signed)
Pre visit review using our clinic review tool, if applicable. No additional management support is needed unless otherwise documented below in the visit note. 

## 2014-04-26 ENCOUNTER — Telehealth: Payer: Self-pay | Admitting: Internal Medicine

## 2014-04-26 NOTE — Telephone Encounter (Signed)
Patient Information:  Caller Name: Merari  Phone: [phone]  Patient: Angela Clay,   Gender: Female  DOB: [birth year]  Age: 80 Years  PCP: Unice Cobble  Office Follow Up:  Does the office need to follow up with this patient?: No  Instructions For The Office: N/A  RN Note:  Advised to call back if sx persist after using Sinus rinses x 24 hours.  Symptoms  Reason For Call & Symptoms: Seen by Dr. Linna Darner on 6/18 and dx with sinus infection by X-ray and Bloodwork and prescribed Augmentin- finished antibiotic on 04/23/14. She was advised to do sinus rinse and follow by Nasocort. Today she is starting again with mild facial pressure, infected smell and taste in mouth and sinuses. No fever. Had dry socket with upper gum infection on 03/31/14 and took Amoxicillin for 10 days.  Reviewed Health History In EMR: Yes  Reviewed Medications In EMR: Yes  Reviewed Allergies In EMR: No  Reviewed Surgeries / Procedures: Yes  Date of Onset of Symptoms: 04/26/2014  Treatments Tried: Nasocort  Treatments Tried Worked: No  Guideline(s) Used:  Sinus Pain and Congestion  Disposition Per Guideline:   Home Care  Reason For Disposition Reached:   Sinus congestion as part of a cold, present < 10 days  Advice Given:  Reassurance:   Sinus congestion is a normal part of a cold.  Usually home treatment with nasal washes can prevent an actual bacterial sinus infection.  Antibiotics are not helpful for the sinus congestion that occurs with colds.  Here is some care advice that should help.  For a Stuffy Nose - Use Nasal Washes:  Introduction: Saline (salt water) nasal irrigation (nasal wash) is an effective and simple home remedy for treating stuffy nose and sinus congestion. The nose can be irrigated by pouring, spraying, or squirting salt water into the nose and then letting it run back out.  How it Helps: The salt water rinses out excess mucus, washes out any irritants (dust, allergens) that might be present,  and moistens the nasal cavity.  Methods: There are several ways to perform nasal irrigation. You can use a saline nasal spray bottle (available over-the-counter), a rubber ear syringe, a medical syringe without the needle, or a Neti Pot.  Pain and Fever Medicines:  For pain or fever relief, take either acetaminophen or ibuprofen.  Acetaminophen (e.g., Tylenol):  Regular Strength Tylenol: Take 650 mg (two 325 mg pills) by mouth every 4-6 hours as needed. Each Regular Strength Tylenol pill has 325 mg of acetaminophen.  Extra Strength Tylenol: Take 1,000 mg (two 500 mg pills) every 8 hours as needed. Each Extra Strength Tylenol pill has 500 mg of acetaminophen.  The most you should take each day is 3,000 mg (10 Regular Strength or 6 Extra Strength pills a day).  Hydration:  Drink plenty of liquids (6-8 glasses of water daily). If the air in your home is dry, use a cool mist humidifier  Expected Course:  Sinus congestion from viral upper respiratory infections (colds) usually lasts 5-10 days.  Occasionally a cold can worsen and turn into bacterial sinusitis. Clues to this are sinus symptoms lasting longer than 10 days, fever lasting longer than 3 days, and worsening pain. Bacterial sinusitis may need antibiotic treatment.  Call Back If:   Severe pain lasts longer than 2 hours after pain medicine  Sinus pain lasts longer than 1 day after starting treatment using nasal washes  Sinus congestion (fullness) lasts longer than 10 days  Fever lasts  longer than 3 days  You become worse.  Patient Will Follow Care Advice:  YES

## 2014-05-07 ENCOUNTER — Other Ambulatory Visit: Payer: Self-pay | Admitting: Internal Medicine

## 2014-05-07 DIAGNOSIS — I739 Peripheral vascular disease, unspecified: Principal | ICD-10-CM

## 2014-05-07 DIAGNOSIS — I779 Disorder of arteries and arterioles, unspecified: Secondary | ICD-10-CM

## 2014-05-08 ENCOUNTER — Other Ambulatory Visit: Payer: Self-pay | Admitting: Internal Medicine

## 2014-05-09 ENCOUNTER — Encounter (HOSPITAL_COMMUNITY): Payer: Medicare HMO

## 2014-05-10 ENCOUNTER — Other Ambulatory Visit (HOSPITAL_COMMUNITY): Payer: Self-pay | Admitting: Cardiology

## 2014-05-10 DIAGNOSIS — I6529 Occlusion and stenosis of unspecified carotid artery: Secondary | ICD-10-CM

## 2014-05-16 ENCOUNTER — Ambulatory Visit (HOSPITAL_COMMUNITY): Payer: Medicare HMO | Attending: Cardiovascular Disease | Admitting: Cardiology

## 2014-05-16 DIAGNOSIS — I10 Essential (primary) hypertension: Secondary | ICD-10-CM | POA: Insufficient documentation

## 2014-05-16 DIAGNOSIS — E785 Hyperlipidemia, unspecified: Secondary | ICD-10-CM | POA: Insufficient documentation

## 2014-05-16 DIAGNOSIS — I6529 Occlusion and stenosis of unspecified carotid artery: Secondary | ICD-10-CM | POA: Insufficient documentation

## 2014-05-16 DIAGNOSIS — I658 Occlusion and stenosis of other precerebral arteries: Secondary | ICD-10-CM | POA: Insufficient documentation

## 2014-05-16 NOTE — Progress Notes (Signed)
Carotid duplex performed 

## 2014-06-19 ENCOUNTER — Other Ambulatory Visit: Payer: Self-pay | Admitting: Internal Medicine

## 2014-06-27 ENCOUNTER — Telehealth: Payer: Self-pay | Admitting: Internal Medicine

## 2014-06-27 NOTE — Telephone Encounter (Signed)
Dr. Larose Kells can see her today at 4:15 if she doesn't want to wait, or any time listed for tomorrow.

## 2014-06-27 NOTE — Telephone Encounter (Signed)
Please advise 

## 2014-06-27 NOTE — Telephone Encounter (Signed)
I can see her today at 4:15 otherwise okay for tomorrow.  If she feels she can't wait-- go to a urgent care

## 2014-06-27 NOTE — Telephone Encounter (Signed)
Caller name: Jaicey  Relation to pt: self  Call back number: [phone]    Reason for call:   pt has a chronic sinus infection and would like to see only Dr. Larose Kells, pt stated she has been having a low grade fever and would like to see Dr. Mamie Nick as soon as possible. Dr. Larose Kells has a same day appointment  06/28/14 at 2:45 is this ok? Please advise

## 2014-06-28 ENCOUNTER — Ambulatory Visit (INDEPENDENT_AMBULATORY_CARE_PROVIDER_SITE_OTHER): Payer: Medicare HMO | Admitting: Internal Medicine

## 2014-06-28 ENCOUNTER — Encounter: Payer: Self-pay | Admitting: Internal Medicine

## 2014-06-28 VITALS — BP 128/68 | HR 94 | Temp 98.1°F | Wt 174.6 lb

## 2014-06-28 DIAGNOSIS — R519 Headache, unspecified: Secondary | ICD-10-CM

## 2014-06-28 DIAGNOSIS — R51 Headache: Secondary | ICD-10-CM

## 2014-06-28 NOTE — Progress Notes (Signed)
Pre visit review using our clinic review tool, if applicable. No additional management support is needed unless otherwise documented below in the visit note. 

## 2014-06-28 NOTE — Progress Notes (Signed)
Subjective:    Patient ID: Angela Clay, female    DOB: July 14, 1934, 80 y.o.   MRN: [id number]  DOS:  06/28/2014 Type of visit - description : acute Interval history: Had a tooth  extraction, L first premolar On 03/23/2014, next month was seen by one of our doctors and  based on the pain at the left maxillary area she was diagnosed with sinusitis. CBC shows slightly elevated white count ; x-ray show bilateral frontal  And L maxillary sinusitis. S/p augmentin Since then she's doing about the same, continue to have mild pain on the left maxillary area and is TTP  ROS Low-grade fever? Mild sinus congestion, mild postnasal dripping.   Past Medical History  Diagnosis Date  . GERD (gastroesophageal reflux disease)     EGD for dysphagia 2000 aprox (-)  . Hyperlipidemia   . Osteopenia   . Hypertension   . Lymphoma     neck,mlig,unspecified site dx 1970s  . Hypothyroidism   . OA (osteoarthritis)     severe at neck  . Syncope 11-2012    admited . w/u (-)    Past Surgical History  Procedure Laterality Date  . Tubal ligation  1972  . Cholecystectomy  02/24/06  . Appendectomy    . Thyroidectomy    . Tonsillectomy and adenoidectomy  1949    History   Social History  . Marital Status: Married    Spouse Name: N/A    Number of Children: 3  . Years of Education: N/A   Occupational History  . retired    Social History Main Topics  . Smoking status: Never Smoker   . Smokeless tobacco: Never Used  . Alcohol Use: No  . Drug Use: No  . Sexual Activity: Not on file   Other Topics Concern  . Not on file   Social History Narrative   3 kids, 8 GK              Medication List       This list is accurate as of: 06/28/14 11:59 PM.  Always use your most recent med list.               acetaminophen 500 MG tablet  Commonly known as:  TYLENOL  Take 500 mg by mouth every 6 (six) hours as needed. For pain     ALEVE PO  Take by mouth as needed.     aspirin 81 MG tablet    Take 81 mg by mouth daily.     atorvastatin 40 MG tablet  Commonly known as:  LIPITOR  TAKE ONE TABLET BY MOUTH AT BEDTIME     calcium-vitamin D 250-125 MG-UNIT per tablet  Commonly known as:  OSCAL  Take 1 tablet by mouth.     clobetasol cream 0.05 %  Commonly known as:  TEMOVATE  Apply 1 application topically 3 (three) times a week.     hydrochlorothiazide 25 MG tablet  Commonly known as:  HYDRODIURIL  Take 1 tablet (25 mg total) by mouth daily.     levothyroxine 125 MCG tablet  Commonly known as:  SYNTHROID, LEVOTHROID  TAKE ONE TABLET BY MOUTH ONCE DAILY     omeprazole 20 MG capsule  Commonly known as:  PRILOSEC  Take 1 capsule (20 mg total) by mouth daily as needed. For acid reflux     SINUS RINSE NA  Place 1 application into the nose as needed.     triamcinolone 55 MCG/ACT Aero nasal  inhaler  Commonly known as:  NASACORT  Place 2 sprays into the nose daily as needed.           Objective:   Physical Exam  HENT:  Head:     BP 128/68  Pulse 94  Temp(Src) 98.1 F (36.7 C) (Oral)  Wt 174 lb 9.7 oz (79.2 kg)  SpO2 95%  General -- alert, well-developed, NAD.  Neck --no LAD HEENT-- Throat symmetric, no redness or discharge.  Face symmetric, sinuses: R no TTP, L slt TTP, see graphic . Nose slt congested. Mouth: Gum without evidence of mass, abscess, no TTP.  Lungs -- normal respiratory effort, no intercostal retractions, no accessory muscle use, and normal breath sounds.  Heart-- normal rate, regular rhythm, no murmur.   Extremities-- no pretibial edema bilaterally  Neurologic--  alert & oriented X3.   Psych-- Cognition and judgment appear intact. Cooperative with normal attention span and concentration. No anxious or depressed appearing.       Assessment & Plan:   Facial pain, Having facial pain since she had a dental extraction in May. I'm not  sure if this is a sinus or dental related pain. I suggested a CT of the sinuses to clarify the issue but  she prefers to see her dentist first. Consequently the plan is: Consistent use of Nasacort See her dentist, if they don't feel the pain is related to a dental issue, patient will call and I will schedule a CT

## 2014-06-28 NOTE — Patient Instructions (Signed)
Use Nasacort consistently 2 puffs every day  Please come back in 3 months for a routine checkup

## 2014-06-29 ENCOUNTER — Telehealth: Payer: Self-pay | Admitting: Internal Medicine

## 2014-06-29 DIAGNOSIS — R519 Headache, unspecified: Secondary | ICD-10-CM

## 2014-06-29 DIAGNOSIS — R51 Headache: Principal | ICD-10-CM

## 2014-06-29 NOTE — Telephone Encounter (Signed)
Caller name: Teryn Relation to pt: self  Call back number: [phone]   Reason for call:   pt wanted to infom Dr. Larose Kells   1. Saw Dentist Dr. Mathews Robinsons this morning the report states no tooth involvment everything looks good and a panorex x ray was completed no findings.  Pt states she will not have c-scan unless you advise her too. Pt states she will use the nasacort as per your advice.

## 2014-06-29 NOTE — Telephone Encounter (Signed)
FYI

## 2014-06-29 NOTE — Telephone Encounter (Signed)
Spoke w/ the patient, we agreed to proceed with a non-urgent CT, order is in

## 2014-07-05 ENCOUNTER — Telehealth: Payer: Self-pay | Admitting: Internal Medicine

## 2014-07-05 ENCOUNTER — Encounter (HOSPITAL_BASED_OUTPATIENT_CLINIC_OR_DEPARTMENT_OTHER): Payer: Self-pay

## 2014-07-05 ENCOUNTER — Ambulatory Visit (HOSPITAL_BASED_OUTPATIENT_CLINIC_OR_DEPARTMENT_OTHER)
Admission: RE | Admit: 2014-07-05 | Discharge: 2014-07-05 | Disposition: A | Discharge status: Home / Self Care | Payer: Medicare HMO | Source: Ambulatory Visit | Attending: Internal Medicine | Admitting: Internal Medicine

## 2014-07-05 DIAGNOSIS — R519 Headache, unspecified: Secondary | ICD-10-CM

## 2014-07-05 DIAGNOSIS — R51 Headache: Secondary | ICD-10-CM | POA: Insufficient documentation

## 2014-07-05 DIAGNOSIS — J0100 Acute maxillary sinusitis, unspecified: Secondary | ICD-10-CM

## 2014-07-05 MED ORDER — LEVOFLOXACIN 500 MG PO TABS: 500.0000 mg | ORAL_TABLET | Freq: Every day | ORAL | Status: DC

## 2014-07-05 NOTE — Telephone Encounter (Signed)
CT sinus bone today shows severe disease, see report below. Plan: Levaquin, ENT referral, left message for the patient.  ------- IMPRESSION:  There is complete opacification of the left maxillary antrum with  extension of sinus disease through the medial left maxillary antrum  into superior left naris. There appears to be some localized bony  destruction in the superomedial left maxillary antrum. There may  well be mucocele formation in this area. Neoplasm is possible in  this area, and direct visualization of this portion of the left  naris/left maxillary sinus complex superomedially is advised,  particularly given a question of bony destruction in the  superomedial left maxillary antrum. Note that there is opacification  extending into and widening of the infundibulum of the left  ostiomeatal unit complex. This finding is probably due to chronic  sinusitis. Neoplastic involvement is conceivable, however in this  region.  There is mild mucosal thickening in the right maxillary antrum as  well as in several ethmoid air cells anteriorly. Other paranasal  sinuses are clear. There is slight rightward deviation the nasal  septum.

## 2014-07-06 NOTE — Telephone Encounter (Signed)
Spoke with the patient, she will see ENT on Monday, she is to start the antibiotic. The radiologist mention severe sinus dz and possibly neoplasm but I will  let the ENT see the patient and the  CT first before I comment on the potential for neoplasm.

## 2014-08-23 ENCOUNTER — Other Ambulatory Visit: Payer: Self-pay

## 2014-08-23 MED ORDER — HYDROCHLOROTHIAZIDE 25 MG PO TABS: 25.0000 mg | ORAL_TABLET | Freq: Every day | ORAL | Status: DC

## 2014-09-19 ENCOUNTER — Ambulatory Visit (INDEPENDENT_AMBULATORY_CARE_PROVIDER_SITE_OTHER): Payer: Medicare HMO

## 2014-09-19 DIAGNOSIS — Z23 Encounter for immunization: Secondary | ICD-10-CM

## 2014-09-19 NOTE — Progress Notes (Signed)
Pt tolerated injection well.  No signs of a reaction upon leaving the clinic.

## 2014-09-19 NOTE — Progress Notes (Signed)
Pre visit review using our clinic review tool, if applicable. No additional management support is needed unless otherwise documented below in the visit note. 

## 2014-09-27 LAB — HM MAMMOGRAPHY: HM Mammogram: NORMAL

## 2014-10-02 ENCOUNTER — Ambulatory Visit: Payer: Medicare HMO | Admitting: Internal Medicine

## 2014-10-04 ENCOUNTER — Encounter: Payer: Self-pay | Admitting: Obstetrics & Gynecology

## 2014-10-04 ENCOUNTER — Ambulatory Visit (INDEPENDENT_AMBULATORY_CARE_PROVIDER_SITE_OTHER): Payer: Medicare HMO | Admitting: Obstetrics & Gynecology

## 2014-10-04 VITALS — BP 161/89 | HR 81 | Temp 97.8°F | Ht 65.0 in | Wt 168.0 lb

## 2014-10-04 DIAGNOSIS — Z01419 Encounter for gynecological examination (general) (routine) without abnormal findings: Secondary | ICD-10-CM

## 2014-10-04 NOTE — Progress Notes (Signed)
Subjective:     Angela Clay is a 80 y.o. female here for a routine exam.     Personal health questionnaire:  Is patient [name], have a family history of breast and/or ovarian cancer: no Is there a family history of uterine cancer diagnosed at age < 3, gastrointestinal cancer, urinary tract cancer, family member who is a Field seismologist syndrome-associated carrier: no Is the patient overweight and hypertensive, family history of diabetes, personal history of gestational diabetes or PCOS: yes Is patient over 44, have PCOS,  family history of premature CHD under age 28, diabetes, smoke, have hypertension or peripheral artery disease:  yes At any time, has a partner hit, kicked or otherwise hurt or frightened you?: no Over the past 2 weeks, have you felt down, depressed or hopeless?: no Over the past 2 weeks, have you felt little interest or pleasure in doing things?:no   Gynecologic History No LMP recorded. Patient is postmenopausal. Last mammogram: 12/15. Results were: normal  Obstetric History OB History  Gravida Para Term Preterm AB SAB TAB Ectopic Multiple Living  4 3 3  1 1    3     # Outcome Date GA Lbr Len/2nd Weight Sex Delivery Anes PTL Lv  4 Term 08/26/71 [redacted]w[redacted]d 3.175 kg (7 lb) F Vag-Spont None N Y  3 Term 06/10/63 429w0d3.175 kg (7 lb) F Vag-Spont Gen N Y  2 Term 03/09/61 401w0d.175 kg (7 lb) M Vag-Spont Gen N Y  1 SAB 1959     SAB         Past Medical History  Diagnosis Date  . GERD (gastroesophageal reflux disease)     EGD for dysphagia 2000 aprox (-)  . Hyperlipidemia   . Osteopenia   . Hypertension   . Lymphoma     neck,mlig,unspecified site dx 1970s  . Hypothyroidism   . OA (osteoarthritis)     severe at neck  . Syncope 11-2012    admited . w/u (-)    Past Surgical History  Procedure Laterality Date  . Tubal ligation  1972  . Cholecystectomy  02/24/06  . Appendectomy    . Thyroidectomy    . Tonsillectomy and adenoidectomy  1949    Current  outpatient prescriptions: acetaminophen (TYLENOL) 500 MG tablet, Take 500 mg by mouth every 6 (six) hours as needed. For pain, Disp: , Rfl: ;  aspirin 81 MG tablet, Take 81 mg by mouth daily.  , Disp: , Rfl: ;  atorvastatin (LIPITOR) 40 MG tablet, TAKE ONE TABLET BY MOUTH AT BEDTIME, Disp: 90 tablet, Rfl: 1;  calcium-vitamin D (OSCAL) 250-125 MG-UNIT per tablet, Take 1 tablet by mouth. , Disp: , Rfl:  clobetasol cream (TEMOVATE) 0.06.14 Apply 1 application topically 3 (three) times a week., Disp: , Rfl: ;  hydrochlorothiazide (HYDRODIURIL) 25 MG tablet, Take 1 tablet (25 mg total) by mouth daily., Disp: 90 tablet, Rfl: 1;  levothyroxine (SYNTHROID, LEVOTHROID) 125 MCG tablet, TAKE ONE TABLET BY MOUTH ONCE DAILY, Disp: 60 tablet, Rfl: 6;  Naproxen Sodium (ALEVE PO), Take by mouth as needed., Disp: , Rfl:  omeprazole (PRILOSEC) 20 MG capsule, Take 1 capsule (20 mg total) by mouth daily as needed. For acid reflux, Disp: 90 capsule, Rfl: 1;  triamcinolone (NASACORT) 55 MCG/ACT AERO nasal inhaler, Place 2 sprays into the nose daily as needed., Disp: , Rfl:  Allergies  Allergen Reactions  . Codeine Nausea And Vomiting    History  Substance Use Topics  .  Smoking status: Never Smoker   . Smokeless tobacco: Never Used  . Alcohol Use: No    Family History  Problem Relation Age of Onset  . Diabetes Neg Hx   . Colon cancer Neg Hx   . Breast cancer Neg Hx   . Stroke Mother 98  . Coronary artery disease Father     MI age 62  . Deep vein thrombosis Brother     PE at age 43  . Heart failure Sister       Review of Systems  Constitutional: negative for fatigue and weight loss Respiratory: negative for cough and wheezing Cardiovascular: negative for chest pain, fatigue and palpitations Gastrointestinal: negative for abdominal pain and change in bowel habits Musculoskeletal:negative for myalgias Neurological: negative for gait problems and tremors Behavioral/Psych: negative for abusive relationship,  depression Endocrine: negative for temperature intolerance   Genitourinary:negative for abnormal menstrual periods, genital lesions, hot flashes, sexual problems and vaginal discharge Integument/breast: negative for breast lump, breast tenderness, nipple discharge and skin lesion(s)    Objective:       BP 161/89 mmHg  Pulse 81  Temp(Src) 97.8 F (36.6 C)  Ht 5' 5"  (1.651 m)  Wt 76.204 kg (168 lb)  BMI 27.96 kg/m2 General:   alert  Skin:   no rash or abnormalities  Lungs:   clear to auscultation bilaterally  Heart:   regular rate and rhythm, S1, S2 normal, no murmur, click, rub or gallop  Breasts:   normal without suspicious masses, skin or nipple changes or axillary nodes  Abdomen:  normal findings: no organomegaly, soft, non-tender and no hernia  Pelvis:  External genitalia: small erythema; minimal loss of architecture Urinary system: urethral meatus normal and bladder without fullness, nontender Vaginal: normal without tenderness, induration or masses Cervix: normal appearance Adnexa: normal bimanual exam Uterus: anteverted and non-tender, normal size   Lab Review  Labs reviewed no Radiologic studies reviewed no    Assessment:    Healthy female exam.   Vulva dystrophy--stable on topical steroid Plan:    Education reviewed: calcium supplements, low fat, low cholesterol diet and weight bearing exercise.   Low potency OTC topical steroid prn; moderate potency steroid with more symptomatic  Follow up as needed.

## 2014-10-04 NOTE — Patient Instructions (Signed)

## 2014-10-10 ENCOUNTER — Encounter: Payer: Self-pay | Admitting: Internal Medicine

## 2014-10-10 ENCOUNTER — Ambulatory Visit (INDEPENDENT_AMBULATORY_CARE_PROVIDER_SITE_OTHER): Payer: Medicare HMO | Admitting: Internal Medicine

## 2014-10-10 VITALS — BP 128/74 | HR 70 | Temp 97.7°F | Wt 168.5 lb

## 2014-10-10 DIAGNOSIS — Z780 Asymptomatic menopausal state: Secondary | ICD-10-CM

## 2014-10-10 DIAGNOSIS — E039 Hypothyroidism, unspecified: Secondary | ICD-10-CM

## 2014-10-10 DIAGNOSIS — M8580 Other specified disorders of bone density and structure, unspecified site: Secondary | ICD-10-CM

## 2014-10-10 LAB — TSH: TSH: 0.67 u[IU]/mL (ref 0.35–4.50)

## 2014-10-10 MED ORDER — OMEPRAZOLE 20 MG PO CPDR: 20.0000 mg | DELAYED_RELEASE_CAPSULE | Freq: Every day | ORAL | Status: DC | PRN

## 2014-10-10 NOTE — Progress Notes (Signed)
Pre visit review using our clinic review tool, if applicable. No additional management support is needed unless otherwise documented below in the visit note. 

## 2014-10-10 NOTE — Patient Instructions (Signed)
Get your blood work before you leave   Please come back to the office by 12-2014 for a physical exam. Come back fasting

## 2014-10-10 NOTE — Progress Notes (Signed)
Subjective:    Patient ID: Angela Clay, female    DOB: 1934/10/18, 80 y.o.   MRN: [id number]  DOS:  10/10/2014 Type of visit - description : rov Interval history: Was recently seen with sinusitis, status post ENT eval, doing better Good compliance with blood pressure, thyroid, cholesterol and BP meds.  ROS  denies chest pain or difficulty breathing No nausea, vomiting, diarrhea  Past Medical History  Diagnosis Date  . GERD (gastroesophageal reflux disease)     EGD for dysphagia 2000 aprox (-)  . Hyperlipidemia   . Osteopenia   . Hypertension   . Lymphoma     neck,mlig,unspecified site dx 1970s  . Hypothyroidism   . OA (osteoarthritis)     severe at neck  . Syncope 11-2012    admited . w/u (-)    Past Surgical History  Procedure Laterality Date  . Tubal ligation  1972  . Cholecystectomy  02/24/06  . Appendectomy    . Thyroidectomy    . Tonsillectomy and adenoidectomy  1949    History   Social History  . Marital Status: Married    Spouse Name: N/A    Number of Children: 3  . Years of Education: N/A   Occupational History  . retired    Social History Main Topics  . Smoking status: Never Smoker   . Smokeless tobacco: Never Used  . Alcohol Use: No  . Drug Use: No  . Sexual Activity:    Partners: Male    Birth Control/ Protection: Post-menopausal   Other Topics Concern  . Not on file   Social History Narrative   3 kids, 8 GK              Medication List       This list is accurate as of: 10/10/14 11:59 PM.  Always use your most recent med list.               acetaminophen 500 MG tablet  Commonly known as:  TYLENOL  Take 500 mg by mouth every 6 (six) hours as needed. For pain     ALEVE PO  Take by mouth as needed.     aspirin 81 MG tablet  Take 81 mg by mouth daily.     atorvastatin 40 MG tablet  Commonly known as:  LIPITOR  TAKE ONE TABLET BY MOUTH AT BEDTIME     calcium-vitamin D 250-125 MG-UNIT per tablet  Commonly known as:   OSCAL  Take 1 tablet by mouth.     clobetasol cream 0.05 %  Commonly known as:  TEMOVATE  Apply 1 application topically 3 (three) times a week.     hydrochlorothiazide 25 MG tablet  Commonly known as:  HYDRODIURIL  Take 1 tablet (25 mg total) by mouth daily.     levothyroxine 125 MCG tablet  Commonly known as:  SYNTHROID, LEVOTHROID  TAKE ONE TABLET BY MOUTH ONCE DAILY     omeprazole 20 MG capsule  Commonly known as:  PRILOSEC  Take 1 capsule (20 mg total) by mouth daily as needed. For acid reflux     triamcinolone 55 MCG/ACT Aero nasal inhaler  Commonly known as:  NASACORT  Place 2 sprays into the nose daily as needed.           Objective:   Physical Exam BP 128/74 mmHg  Pulse 70  Temp(Src) 97.7 F (36.5 C) (Oral)  Wt 168 lb 8 oz (76.431 kg)  SpO2 98% General --  alert, well-developed, NAD.  Lungs -- normal respiratory effort, no intercostal retractions, no accessory muscle use, and normal breath sounds.  Heart-- normal rate, regular rhythm, no murmur.  Extremities-- no pretibial edema bilaterally  Neurologic--  alert & oriented X3. Speech normal, gait appropriate for age, strength symmetric and appropriate for age.  Psych-- Cognition and judgment appear intact. Cooperative with normal attention span and concentration. No anxious or depressed appearing.      Assessment & Plan:  Sinusitis, Was diagnosed here with severe sinusitis, see CT scan report, status post ENT evaluation and treatment, doing well, she was discharged from ENT, plan is to use saline nasal and Nasacort consistently Hypothyroidism, good compliance with Synthroid, check a TSH GERD, refill Prilosec. Hypertension, BP is great, continue with hydrochlorothiazide, last   BMP satisfactory

## 2014-10-11 NOTE — Assessment & Plan Note (Signed)
Due for a bone density test, will arrange

## 2014-10-24 ENCOUNTER — Encounter: Payer: Self-pay | Admitting: Obstetrics & Gynecology

## 2014-11-25 ENCOUNTER — Telehealth: Payer: Self-pay | Admitting: Internal Medicine

## 2014-11-25 DIAGNOSIS — I739 Peripheral vascular disease, unspecified: Principal | ICD-10-CM

## 2014-11-25 DIAGNOSIS — I779 Disorder of arteries and arterioles, unspecified: Secondary | ICD-10-CM

## 2014-11-25 NOTE — Telephone Encounter (Signed)
Due for  a carotid ultrasound, DX carotid artery disease. Please arrange

## 2014-11-27 NOTE — Telephone Encounter (Signed)
US carotid ordered.

## 2014-11-29 ENCOUNTER — Other Ambulatory Visit (HOSPITAL_COMMUNITY): Payer: Self-pay | Admitting: Cardiology

## 2014-11-29 DIAGNOSIS — I6523 Occlusion and stenosis of bilateral carotid arteries: Secondary | ICD-10-CM

## 2014-12-05 ENCOUNTER — Encounter (HOSPITAL_COMMUNITY): Payer: Medicare HMO

## 2014-12-05 ENCOUNTER — Ambulatory Visit (HOSPITAL_COMMUNITY): Payer: PPO | Attending: Internal Medicine | Admitting: Cardiology

## 2014-12-05 DIAGNOSIS — I6523 Occlusion and stenosis of bilateral carotid arteries: Secondary | ICD-10-CM | POA: Diagnosis not present

## 2014-12-06 NOTE — Progress Notes (Signed)
Carotid duplex performed 

## 2014-12-26 ENCOUNTER — Other Ambulatory Visit: Payer: Self-pay

## 2014-12-26 MED ORDER — ATORVASTATIN CALCIUM 40 MG PO TABS: 40.0000 mg | ORAL_TABLET | Freq: Every day | ORAL | Status: DC

## 2015-01-24 ENCOUNTER — Encounter: Payer: Self-pay | Admitting: Internal Medicine

## 2015-01-24 ENCOUNTER — Ambulatory Visit (INDEPENDENT_AMBULATORY_CARE_PROVIDER_SITE_OTHER): Payer: PPO | Admitting: Internal Medicine

## 2015-01-24 VITALS — BP 124/84 | HR 68 | Temp 98.1°F | Ht 65.0 in | Wt 168.5 lb

## 2015-01-24 DIAGNOSIS — I739 Peripheral vascular disease, unspecified: Secondary | ICD-10-CM

## 2015-01-24 DIAGNOSIS — Z0000 Encounter for general adult medical examination without abnormal findings: Secondary | ICD-10-CM

## 2015-01-24 DIAGNOSIS — I779 Disorder of arteries and arterioles, unspecified: Secondary | ICD-10-CM

## 2015-01-24 DIAGNOSIS — E785 Hyperlipidemia, unspecified: Secondary | ICD-10-CM

## 2015-01-24 DIAGNOSIS — R739 Hyperglycemia, unspecified: Secondary | ICD-10-CM | POA: Diagnosis not present

## 2015-01-24 DIAGNOSIS — M8580 Other specified disorders of bone density and structure, unspecified site: Secondary | ICD-10-CM

## 2015-01-24 DIAGNOSIS — I10 Essential (primary) hypertension: Secondary | ICD-10-CM

## 2015-01-24 LAB — CBC WITH DIFFERENTIAL/PLATELET
Basophils Absolute: 0 10*3/uL (ref 0.0–0.1)
Basophils Relative: 0.6 % (ref 0.0–3.0)
Eosinophils Absolute: 0.3 10*3/uL (ref 0.0–0.7)
Eosinophils Relative: 5.7 % — ABNORMAL HIGH (ref 0.0–5.0)
HCT: 42.3 % (ref 36.0–46.0)
Hemoglobin: 14.4 g/dL (ref 12.0–15.0)
Lymphocytes Relative: 24.5 % (ref 12.0–46.0)
Lymphs Abs: 1.4 10*3/uL (ref 0.7–4.0)
MCHC: 34 g/dL (ref 30.0–36.0)
MCV: 84.8 fl (ref 78.0–100.0)
Monocytes Absolute: 0.4 10*3/uL (ref 0.1–1.0)
Monocytes Relative: 7.2 % (ref 3.0–12.0)
Neutro Abs: 3.5 10*3/uL (ref 1.4–7.7)
Neutrophils Relative %: 62 % (ref 43.0–77.0)
Platelets: 217 10*3/uL (ref 150.0–400.0)
RBC: 4.99 Mil/uL (ref 3.87–5.11)
RDW: 15.1 % (ref 11.5–15.5)
WBC: 5.6 10*3/uL (ref 4.0–10.5)

## 2015-01-24 LAB — LIPID PANEL
Cholesterol: 167 mg/dL (ref 0–200)
HDL: 57.4 mg/dL (ref >39.00)
LDL Cholesterol: 79 mg/dL (ref 0–99)
NonHDL: 109.6
Total CHOL/HDL Ratio: 3
Triglycerides: 153 mg/dL — ABNORMAL HIGH (ref 0.0–149.0)
VLDL: 30.6 mg/dL (ref 0.0–40.0)

## 2015-01-24 LAB — COMPREHENSIVE METABOLIC PANEL
ALT: 18 U/L (ref 0–35)
AST: 16 U/L (ref 0–37)
Albumin: 4.1 g/dL (ref 3.5–5.2)
Alkaline Phosphatase: 83 U/L (ref 39–117)
BUN: 17 mg/dL (ref 6–23)
CO2: 32 mEq/L (ref 19–32)
Calcium: 9.8 mg/dL (ref 8.4–10.5)
Chloride: 101 mEq/L (ref 96–112)
Creatinine, Ser: 0.86 mg/dL (ref 0.40–1.20)
GFR: 67.62 mL/min (ref >60.00)
Glucose, Bld: 102 mg/dL — ABNORMAL HIGH (ref 70–99)
Potassium: 3.5 mEq/L (ref 3.5–5.1)
Sodium: 140 mEq/L (ref 135–145)
Total Bilirubin: 0.7 mg/dL (ref 0.2–1.2)
Total Protein: 7.5 g/dL (ref 6.0–8.3)

## 2015-01-24 LAB — HEMOGLOBIN A1C: Hgb A1c MFr Bld: 6 % (ref 4.6–6.5)

## 2015-01-24 MED ORDER — LEVOTHYROXINE SODIUM 125 MCG PO TABS: 125.0000 ug | ORAL_TABLET | Freq: Every day | ORAL | Status: DC

## 2015-01-24 NOTE — Assessment & Plan Note (Signed)
Check A1c. 

## 2015-01-24 NOTE — Assessment & Plan Note (Signed)
Continue HCTZ, good ambulatory BPs, check a CMP

## 2015-01-24 NOTE — Assessment & Plan Note (Signed)
Bone density not done, will arrange

## 2015-01-24 NOTE — Progress Notes (Signed)
Pre visit review using our clinic review tool, if applicable. No additional management support is needed unless otherwise documented below in the visit note. 

## 2015-01-24 NOTE — Progress Notes (Signed)
Subjective:    Patient ID: Angela Clay, female    DOB: [birth year], 81 y.o.   MRN: [id number]  DOS:  01/24/2015 Type of visit - description :    Here for Medicare AWV:  1. Risk factors based on Past M, S, F history: reviewed  2. Physical Activities: active, home chores, active at church, silver snickers x3/week  3. Depression/mood: No problems noted or reported  4. Hearing: Very good, no problems w/ normal conversation  5. ADL's: independent, drives  6. Fall Risk: no recent falls, prevention discussed 7. Home Safety: does feelsafe at home  8. Height, weight, &visual acuity: see VS, saw the eye doctor, due for another visit June 2016 9. Counseling: provided  10. Labs ordered based on risk factors: yes  11. Referral Coordination: if needed  12. Care Plan, see a/p  13. Cognitive Assessment: memory and cognition  seem appropriate, motor skills somewhat limited by neck DJD 13. Care team updated 15. End-of-life-care discussed   in addition, we discussed the following: DJD, currently not a major issue. High cholesterol, good compliance with Lipitor without apparent side effects Hypertension, on HCTZ, ambulatory BPs usually in the 130s Hypothyroidism, good compliance with medicines  Review of Systems Constitutional: No fever, chills. No unexplained wt changes. No unusual sweats HEENT: No dental problems, ear discharge, facial swelling, voice changes. No eye discharge, redness or intolerance to light Respiratory: No wheezing or difficulty breathing. No cough , mucus production Cardiovascular: No CP, leg swelling or palpitations GI: no nausea, vomiting, diarrhea or abdominal pain.  No blood in the stools. No dysphagia   Endocrine: No polyphagia, polyuria or polydipsia GU: No dysuria, gross hematuria, difficulty urinating. No urinary urgency or frequency. Musculoskeletal: No joint swellings or unusual aches or pains Skin: No change in the color of the skin, palor or  rash Allergic, immunologic: No environmental allergies or food allergies Neurological: No dizziness or syncope. No headaches. No diplopia, slurred speech, motor deficits, facial numbness Hematological: No enlarged lymph nodes, easy bruising or bleeding Psychiatry: No suicidal ideas, hallucinations, behavior problems or confusion. No unusual/severe anxiety or depression.     Past Medical History  Diagnosis Date  . GERD (gastroesophageal reflux disease)     EGD for dysphagia 2000 aprox (-)  . Hyperlipidemia   . Osteopenia   . Hypertension   . Lymphoma     neck,mlig,unspecified site dx 1970s  . Hypothyroidism   . OA (osteoarthritis)     severe at neck  . Syncope 11-2012    admited . w/u (-)    Past Surgical History  Procedure Laterality Date  . Tubal ligation  1972  . Cholecystectomy  02/24/06  . Appendectomy    . Thyroidectomy    . Tonsillectomy and adenoidectomy  1949    History   Social History  . Marital Status: Married    Spouse Name: N/A  . Number of Children: 3  . Years of Education: N/A   Occupational History  . retired    Social History Main Topics  . Smoking status: Never Smoker   . Smokeless tobacco: Never Used  . Alcohol Use: No  . Drug Use: No  . Sexual Activity:    Partners: Male    Birth Control/ Protection: Post-menopausal   Other Topics Concern  . Not on file   Social History Narrative   3 kids, 8 GK           Family History  Problem Relation Age of Onset  .  Diabetes Neg Hx   . Colon cancer Neg Hx   . Breast cancer Neg Hx   . Stroke Mother 42  . Coronary artery disease Father     MI age 32  . Deep vein thrombosis Brother     PE at age 47  . Heart failure Sister        Medication List       This list is accurate as of: 01/24/15  5:39 PM.  Always use your most recent med list.               acetaminophen 500 MG tablet  Commonly known as:  TYLENOL  Take 500 mg by mouth every 6 (six) hours as needed. For pain     ALEVE PO   Take by mouth as needed.     aspirin 81 MG tablet  Take 81 mg by mouth daily.     atorvastatin 40 MG tablet  Commonly known as:  LIPITOR  Take 1 tablet (40 mg total) by mouth at bedtime.     calcium-vitamin D 250-125 MG-UNIT per tablet  Commonly known as:  OSCAL  Take 1 tablet by mouth.     clobetasol cream 0.05 %  Commonly known as:  TEMOVATE  Apply 1 application topically 3 (three) times a week.     hydrochlorothiazide 25 MG tablet  Commonly known as:  HYDRODIURIL  Take 1 tablet (25 mg total) by mouth daily.     levothyroxine 125 MCG tablet  Commonly known as:  SYNTHROID, LEVOTHROID  Take 1 tablet (125 mcg total) by mouth daily.     omeprazole 20 MG capsule  Commonly known as:  PRILOSEC  Take 1 capsule (20 mg total) by mouth daily as needed. For acid reflux     triamcinolone 55 MCG/ACT Aero nasal inhaler  Commonly known as:  NASACORT  Place 2 sprays into the nose daily as needed.           Objective:   Physical Exam BP 124/84 mmHg  Pulse 68  Temp(Src) 98.1 F (36.7 C) (Oral)  Ht 5' 5"  (1.651 m)  Wt 168 lb 8 oz (76.431 kg)  BMI 28.04 kg/m2  SpO2 98% General:   Well developed, well nourished . NAD.  Neck:  Full range of motion. Supple. No  thyromegaly   HEENT:  Normocephalic . Face symmetric, atraumatic Lungs:  CTA B Normal respiratory effort, no intercostal retractions, no accessory muscle use. Heart: RRR,  no murmur.  Abdomen:  Not distended, soft, non-tender. No rebound or rigidity. No mass,organomegaly, n bruit Muscle skeletal: no pretibial edema bilaterally  Skin: Exposed areas without rash. Not pale. Not jaundice Neurologic:  alert & oriented X3.  Speech normal, gait appropriate for age , limited by DJD, unassisted Strength symmetric and appropriate for age.  Psych: Cognition and judgment appear intact.  Cooperative with normal attention span and concentration.  Behavior appropriate. No anxious or depressed appearing.      Assessment &  Plan:

## 2015-01-24 NOTE — Assessment & Plan Note (Signed)
Last ultrasound 11/2014, repeat in 6 months

## 2015-01-24 NOTE — Patient Instructions (Signed)
Get your blood work before you leave      Come back to the office in 6 months   for a routine check up     [place] cause injuries and can affect all age groups. It is possible to use preventive measures to significantly decrease the likelihood of falls. There are many simple measures which can make your home safer and prevent falls. OUTDOORS  Repair cracks and edges of walkways and driveways.  Remove high doorway thresholds.  Trim shrubbery on the main path into your home.  Have good outside lighting.  Clear walkways of tools, rocks, debris, and clutter.  Check that handrails are not broken and are securely fastened. Both sides of steps should have handrails.  Have leaves, snow, and ice cleared regularly.  Use sand or salt on walkways during winter months.  In the garage, clean up grease or oil spills. BATHROOM  Install night lights.  Install grab bars by the toilet and in the tub and shower.  Use non-skid mats or decals in the tub or shower.  Place a plastic non-slip stool in the shower to sit on, if needed.  Keep floors dry and clean up all water on the floor immediately.  Remove soap buildup in the tub or shower on a regular basis.  Secure bath mats with non-slip, double-sided rug tape.  Remove throw rugs and tripping hazards from the floors. BEDROOMS  Install night lights.  Make sure a bedside light is easy to reach.  Do not use oversized bedding.  Keep a telephone by your bedside.  Have a firm chair with side arms to use for getting dressed.  Remove throw rugs and tripping hazards from the floor. KITCHEN  Keep handles on pots and pans turned toward the center of the stove. Use back burners when possible.  Clean up spills quickly and allow time for drying.  Avoid walking on wet floors.  Avoid hot utensils and knives.  Position shelves so they are not too high or low.  Place commonly used objects within easy  reach.  If necessary, use a sturdy step stool with a grab bar when reaching.  Keep electrical cables out of the way.  Do not use floor polish or wax that makes floors slippery. If you must use wax, use non-skid floor wax.  Remove throw rugs and tripping hazards from the floor. STAIRWAYS  Never leave objects on stairs.  Place handrails on both sides of stairways and use them. Fix any loose handrails. Make sure handrails on both sides of the stairways are as long as the stairs.  Check carpeting to make sure it is firmly attached along stairs. Make repairs to worn or loose carpet promptly.  Avoid placing throw rugs at the top or bottom of stairways, or properly secure the rug with carpet tape to prevent slippage. Get rid of throw rugs, if possible.  Have an electrician put in a light switch at the top and bottom of the stairs. OTHER FALL PREVENTION TIPS  Wear low-heel or rubber-soled shoes that are supportive and fit well. Wear closed toe shoes.  When using a stepladder, make sure it is fully opened and both spreaders are firmly locked. Do not climb a closed stepladder.  Add color or contrast paint or tape to grab bars and handrails in your home. Place contrasting color strips on first and last steps.  Learn and use mobility aids as needed. Install an electrical emergency response system.  Turn  on lights to avoid dark areas. Replace light bulbs that burn out immediately. Get light switches that glow.  Arrange furniture to create clear pathways. Keep furniture in the same place.  Firmly attach carpet with non-skid or double-sided tape.  Eliminate uneven floor surfaces.  Select a carpet pattern that does not visually hide the edge of steps.  Be aware of all pets. OTHER HOME SAFETY TIPS  Set the water temperature for 120 F (48.8 C).  Keep emergency numbers on or near the telephone.  Keep smoke detectors on every level of the home and near sleeping areas. Document Released:  10/03/2002 Document Revised: 04/13/2012 Document Reviewed: 01/02/2012 [facility] Patient Information 2015 [place], [place]. This information is not intended to replace advice given to you by your health care provider. Make sure you discuss any questions you have with your health care provider.   Preventive Care for Adults Ages 78 and over  Blood pressure check.** / Every 1 to 2 years.  Lipid and cholesterol check.**/ Every 5 years beginning at age 53.  Lung cancer screening. / Every year if you are aged 73-80 years and have a 30-pack-year history of smoking and currently smoke or have quit within the past 15 years. Yearly screening is stopped once you have quit smoking for at least 15 years or develop a health problem that would prevent you from having lung cancer treatment.  Fecal occult blood test (FOBT) of stool. / Every year beginning at age 2 and continuing until age 67. You may not have to do this test if you get a colonoscopy every 10 years.  Flexible sigmoidoscopy** or colonoscopy.** / Every 5 years for a flexible sigmoidoscopy or every 10 years for a colonoscopy beginning at age 33 and continuing until age 72.  Hepatitis C blood test.** / For all people born from [birth year] through 1963 and any individual with known risks for hepatitis C.  Abdominal aortic aneurysm (AAA) screening.** / A one-time screening for ages 44 to 24 years who are current or former smokers.  Skin self-exam. / Monthly.  Influenza vaccine. / Every year.  Tetanus, diphtheria, and acellular pertussis (Tdap/Td) vaccine.** / 1 dose of Td every 10 years.  Varicella vaccine.** / Consult your health care provider.  Zoster vaccine.** / 1 dose for adults aged 31 years or older.  Pneumococcal 13-valent conjugate (PCV13) vaccine.** / Consult your health care provider.  Pneumococcal polysaccharide (PPSV23) vaccine.** / 1 dose for all adults aged 54 years and older.  Meningococcal vaccine.** / Consult your health care  provider.  Hepatitis A vaccine.** / Consult your health care provider.  Hepatitis B vaccine.** / Consult your health care provider.  Haemophilus influenzae type b (Hib) vaccine.** / Consult your health care provider. **Family history and personal history of risk and conditions may change your health care provider's recommendations. Document Released: 12/09/2001 Document Revised: 10/18/2013 Document Reviewed: 03/10/2011 [facility] Patient Information 2015 [place], [place]. This information is not intended to replace advice given to you by your health care provider. Make sure you discuss any questions you have with your health care provider.

## 2015-01-24 NOTE — Assessment & Plan Note (Signed)
Continue Lipitor, check FLP

## 2015-01-24 NOTE — Assessment & Plan Note (Signed)
Td 11-09 Pneumina shot 2008 prevnar 2015 shingles shot 02-2013   [facility]--10/2012--Dr Brodie--benign polyp--no recall needed   gynecology : used to see Dr Lisa [name] (she left), plans to see a new gyn , already got a letter  02-2011, vulvar biopsy was negative (uses clobetasol prn on that area)  [facility]--09-2014 Benign Findings   Encouraged to cont healthy life style

## 2015-01-31 ENCOUNTER — Ambulatory Visit (INDEPENDENT_AMBULATORY_CARE_PROVIDER_SITE_OTHER)
Admission: RE | Admit: 2015-01-31 | Discharge: 2015-01-31 | Disposition: A | Discharge status: Home / Self Care | Payer: PPO | Source: Ambulatory Visit | Attending: Internal Medicine | Admitting: Internal Medicine

## 2015-01-31 DIAGNOSIS — M8580 Other specified disorders of bone density and structure, unspecified site: Secondary | ICD-10-CM | POA: Diagnosis not present

## 2015-02-27 ENCOUNTER — Encounter: Payer: Self-pay | Admitting: Internal Medicine

## 2015-02-27 ENCOUNTER — Telehealth: Payer: Self-pay | Admitting: Internal Medicine

## 2015-02-27 MED ORDER — HYDROCHLOROTHIAZIDE 25 MG PO TABS: 25.0000 mg | ORAL_TABLET | Freq: Every day | ORAL | Status: DC

## 2015-02-27 NOTE — Telephone Encounter (Signed)
Caller name: Nataleigh Relation to pt: self Call back number: [phone] Pharmacy: [facility] [address]  Reason for call:   Requesting hydrochlorothiazide (HYDRODIURIL) refill

## 2015-02-27 NOTE — Telephone Encounter (Signed)
HCTZ refilled to [facility] on [address] as requested.

## 2015-05-09 ENCOUNTER — Other Ambulatory Visit: Payer: Self-pay

## 2015-05-09 MED ORDER — LEVOTHYROXINE SODIUM 125 MCG PO TABS: 125.0000 ug | ORAL_TABLET | Freq: Every day | ORAL | Status: DC

## 2015-05-28 DIAGNOSIS — L570 Actinic keratosis: Secondary | ICD-10-CM

## 2015-05-28 HISTORY — DX: Actinic keratosis: L57.0

## 2015-06-15 ENCOUNTER — Other Ambulatory Visit: Payer: Self-pay

## 2015-06-15 DIAGNOSIS — I779 Disorder of arteries and arterioles, unspecified: Secondary | ICD-10-CM

## 2015-06-15 DIAGNOSIS — I739 Peripheral vascular disease, unspecified: Principal | ICD-10-CM

## 2015-06-23 ENCOUNTER — Other Ambulatory Visit: Payer: Self-pay | Admitting: Internal Medicine

## 2015-07-03 ENCOUNTER — Ambulatory Visit (INDEPENDENT_AMBULATORY_CARE_PROVIDER_SITE_OTHER): Payer: PPO | Admitting: Internal Medicine

## 2015-07-03 ENCOUNTER — Encounter: Payer: Self-pay | Admitting: Internal Medicine

## 2015-07-03 VITALS — BP 122/70 | HR 76 | Temp 98.1°F | Ht 65.0 in | Wt 170.0 lb

## 2015-07-03 DIAGNOSIS — I10 Essential (primary) hypertension: Secondary | ICD-10-CM | POA: Diagnosis not present

## 2015-07-03 DIAGNOSIS — Z23 Encounter for immunization: Secondary | ICD-10-CM | POA: Diagnosis not present

## 2015-07-03 DIAGNOSIS — Z09 Encounter for follow-up examination after completed treatment for conditions other than malignant neoplasm: Secondary | ICD-10-CM

## 2015-07-03 DIAGNOSIS — E039 Hypothyroidism, unspecified: Secondary | ICD-10-CM

## 2015-07-03 LAB — BASIC METABOLIC PANEL
BUN: 15 mg/dL (ref 6–23)
CO2: 28 mEq/L (ref 19–32)
Calcium: 9.8 mg/dL (ref 8.4–10.5)
Chloride: 103 mEq/L (ref 96–112)
Creatinine, Ser: 0.86 mg/dL (ref 0.40–1.20)
GFR: 67.55 mL/min (ref >60.00)
Glucose, Bld: 99 mg/dL (ref 70–99)
Potassium: 4.7 mEq/L (ref 3.5–5.1)
Sodium: 141 mEq/L (ref 135–145)

## 2015-07-03 LAB — TSH: TSH: 0.3 u[IU]/mL — ABNORMAL LOW (ref 0.35–4.50)

## 2015-07-03 MED ORDER — ATORVASTATIN CALCIUM 40 MG PO TABS: 40.0000 mg | ORAL_TABLET | Freq: Every day | ORAL | Status: DC

## 2015-07-03 MED ORDER — OMEPRAZOLE 20 MG PO CPDR: 20.0000 mg | DELAYED_RELEASE_CAPSULE | Freq: Every day | ORAL | Status: DC | PRN

## 2015-07-03 MED ORDER — HYDROCHLOROTHIAZIDE 25 MG PO TABS: 25.0000 mg | ORAL_TABLET | Freq: Every day | ORAL | Status: DC

## 2015-07-03 NOTE — Progress Notes (Signed)
Pre visit review using our clinic review tool, if applicable. No additional management support is needed unless otherwise documented below in the visit note. 

## 2015-07-03 NOTE — Progress Notes (Signed)
Subjective:    Patient ID: Angela Clay, female    DOB: 1934/02/17, 81 y.o.   MRN: [id number]  DOS:  07/03/2015 Type of visit - description : Routine office visit Interval history: In general feeling well, no major concerns. Good compliance with hypertension, cholesterol and thyroid medications. 2 or 3 days ago started to have pain at the right great toe. No injury, fever, chills. The area was not red or hot but it was swollen. She had previously surgery for a bunion in that area    Review of Systems  no chest pain, difficulty breathing or pretibial edema. No recent calf pain   Past Medical History  Diagnosis Date  . GERD (gastroesophageal reflux disease)     EGD for dysphagia 2000 aprox (-)  . Hyperlipidemia   . Osteopenia   . Hypertension   . Lymphoma     neck,mlig,unspecified site dx 1970s  . Hypothyroidism   . OA (osteoarthritis)     severe at neck  . Syncope 11-2012    admited . w/u (-)  . Actinic keratosis 05/2015    Right mid central dorsal hand    Past Surgical History  Procedure Laterality Date  . Tubal ligation  1972  . Cholecystectomy  02/24/06  . Appendectomy    . Thyroidectomy    . Tonsillectomy and adenoidectomy  1949    Social History   Social History  . Marital Status: Married    Spouse Name: N/A  . Number of Children: 3  . Years of Education: N/A   Occupational History  . retired    Social History Main Topics  . Smoking status: Never Smoker   . Smokeless tobacco: Never Used  . Alcohol Use: No  . Drug Use: No  . Sexual Activity:    Partners: Male    Birth Control/ Protection: Post-menopausal   Other Topics Concern  . Not on file   Social History Narrative   3 kids, 8 GK              Medication List       This list is accurate as of: 07/03/15  4:40 PM.  Always use your most recent med list.               acetaminophen 500 MG tablet  Commonly known as:  TYLENOL  Take 500 mg by mouth every 6 (six) hours as needed. For pain      ALEVE PO  Take by mouth as needed.     aspirin 81 MG tablet  Take 81 mg by mouth daily.     atorvastatin 40 MG tablet  Commonly known as:  LIPITOR  Take 1 tablet (40 mg total) by mouth at bedtime.     calcium-vitamin D 250-125 MG-UNIT per tablet  Commonly known as:  OSCAL  Take 1 tablet by mouth.     clobetasol cream 0.05 %  Commonly known as:  TEMOVATE  Apply 1 application topically 3 (three) times a week.     hydrochlorothiazide 25 MG tablet  Commonly known as:  HYDRODIURIL  Take 1 tablet (25 mg total) by mouth daily.     levothyroxine 125 MCG tablet  Commonly known as:  SYNTHROID, LEVOTHROID  Take 1 tablet (125 mcg total) by mouth daily.     omeprazole 20 MG capsule  Commonly known as:  PRILOSEC  Take 1 capsule (20 mg total) by mouth daily as needed.     triamcinolone 55 MCG/ACT Aero nasal  inhaler  Commonly known as:  NASACORT  Place 2 sprays into the nose daily as needed.           Objective:   Physical Exam BP 122/70 mmHg  Pulse 76  Temp(Src) 98.1 F (36.7 C) (Oral)  Ht 5' 5"  (1.651 m)  Wt 170 lb (77.111 kg)  BMI 28.29 kg/m2  SpO2 97% General:   Well developed, well nourished . NAD.  HEENT:  Normocephalic . Face symmetric, atraumatic Lungs:  CTA B Normal respiratory effort, no intercostal retractions, no accessory muscle use. Heart: RRR,  no murmur.  No pretibial edema bilaterally  MSK: Left foot normal Right foot: Well-healed surgical scar at the base of the great toe, no redness, warmness, + swelling distally throughout the foot range of motion of the great toe slightly decreased compared to the left. Calves symmetric and not TTP Neurologic:  alert & oriented X3.  Speech normal, gait appropriate for age and unassisted Psych--  Cognition and judgment appear intact.  Cooperative with normal attention span and concentration.  Behavior appropriate. No anxious or depressed appearing.      Assessment & Plan:    Problems  list Prediabetes Hyperlipidemia Hypertension Hypothyroidism CV --Carotid artery disease DJD, severe at the neck GERD, dysphagia remotely Osteopenia Lymphoma,  Neck, 1970s Syncope, admitted-2014, workup negative  A/P Hypothyroidism: Continue Synthroid, check a TSH Hypertension: Check a BMP, BP is very good today R foot swelling: At the area of previous bunion surgery, doubt infection as there is no warmness, redness or fever. H/o bunion surgery per podiatry, plans to see ortho, will call if a referral is needed.Marland Kitchen

## 2015-07-03 NOTE — Assessment & Plan Note (Signed)
Hypothyroidism: Continue Synthroid, check a TSH Hypertension: Check a BMP, BP is very good today R foot swelling: At the area of previous bunion surgery, doubt infection as there is no warmness, redness or fever. H/o bunion surgery per podiatry, plans to see ortho, will call if a referral is needed.Angela Clay

## 2015-07-03 NOTE — Patient Instructions (Signed)
Get your blood work before you leave     Next visit  for a  physical exam in 6 months  Please schedule an appointment at the front desk Please come back fasting

## 2015-07-05 ENCOUNTER — Other Ambulatory Visit: Payer: Self-pay

## 2015-07-05 DIAGNOSIS — E039 Hypothyroidism, unspecified: Secondary | ICD-10-CM

## 2015-07-17 ENCOUNTER — Encounter: Payer: Self-pay | Admitting: Podiatry

## 2015-07-17 ENCOUNTER — Ambulatory Visit (INDEPENDENT_AMBULATORY_CARE_PROVIDER_SITE_OTHER): Payer: PPO | Admitting: Podiatry

## 2015-07-17 ENCOUNTER — Ambulatory Visit (INDEPENDENT_AMBULATORY_CARE_PROVIDER_SITE_OTHER): Payer: PPO

## 2015-07-17 ENCOUNTER — Other Ambulatory Visit: Payer: Self-pay | Admitting: Podiatry

## 2015-07-17 VITALS — BP 132/73 | HR 71 | Temp 97.0°F | Resp 14

## 2015-07-17 DIAGNOSIS — M778 Other enthesopathies, not elsewhere classified: Secondary | ICD-10-CM

## 2015-07-17 DIAGNOSIS — R52 Pain, unspecified: Secondary | ICD-10-CM

## 2015-07-17 DIAGNOSIS — M19071 Primary osteoarthritis, right ankle and foot: Secondary | ICD-10-CM | POA: Diagnosis not present

## 2015-07-17 DIAGNOSIS — M7750 Other enthesopathy of unspecified foot: Secondary | ICD-10-CM | POA: Diagnosis not present

## 2015-07-17 DIAGNOSIS — M779 Enthesopathy, unspecified: Secondary | ICD-10-CM

## 2015-07-17 LAB — URIC ACID: Uric Acid, Serum: 10.3 mg/dL — ABNORMAL HIGH (ref 2.4–7.0)

## 2015-07-17 LAB — RHEUMATOID FACTOR: Rhuematoid fact SerPl-aCnc: <10 IU/mL (ref <=14)

## 2015-07-17 LAB — C-REACTIVE PROTEIN: CRP: <0.5 mg/dL (ref <0.60)

## 2015-07-17 NOTE — Patient Instructions (Signed)
Today I am referring you to the lab for an arthritis panel for the indication of a painful swollen right great toe joint without any direct injury X-ray examination today reveals decreased joint space in the great toe joint Internal fixation at the base of first metatarsal, not located in around the great toe joint area

## 2015-07-17 NOTE — Progress Notes (Signed)
   Subjective:    Patient ID: Angela Clay, female    DOB: Sep 15, 1934, 81 y.o.   MRN: [id number]  HPI   . This patient presents today with approximately 2 weeks history of pain and swelling in around the right great toe joint area area. Patient describes some reduction of swelling and pain after a week, however, the symptoms exacerbate in the past week. She has pain and discomfort with walking and motion of the right great toe joint causing her to limp. The pain reduces when she sits or does not walk. She has a history of a bunionectomy on the right foot approximately 10 years ago. Patient denies any direct injury or any other joint pain. She has reduced her activity to accommodate to the foot pain  Review of Systems  All other systems reviewed and are negative.      Objective:   Physical Exam  Orientated 3  Vascular: Mild nonpitting edema dorsal right first MPJ area DP pulses 2/4 bilaterally PT pulses 2/4 bilaterally Capillary reflex immediate bilaterally  Neurological: Sensation to 10 g monofilament wire intact 5/5 bilaterally Vibratory sensation intact bilaterally Ankle reflex equal and reactive bilaterally  Dermatological: Well-healed surgical scar dorsal medial first MPJ No open skin lesions bilaterally  Musculoskeletal: Palpable tenderness right first MPJ and tenderness on range of motion right first MPJ HAV deformity left   X-ray weightbearing right foot dated 07/17/2015  Intact bony structure without fracture and/or dislocation Decreased bone density noted in all views Creased soft tissue density first MPJ without emphysema Decreased joint space right first MPJ Retained internal fixation screws 2 base of first right metatarsal with fusion of first metatarsal cuneiform postoperatively Inferior calcaneal spur  Radiographic impression: No acute bony abnormality noted Osteoarthritic changes first MPJ Surgical fusionfirst metatarsocuneiform with retained  internal fixation     Assessment & Plan:   Assessment: Satisfactory neurovascular status Osteoarthritis first MPJ right Rule out inflammatory diseases including gout or other inflammatory arthritis  Plan: Today review the results of the x-ray and examination with patient today. I am referring her to the lab for an arthritis screen panel for the indication of a painful swollen right great toe joint Advised patient to limit standing walking wearing stiff soled shoe  Reappoint 7 days to review the results of arthritis screening panel

## 2015-07-18 LAB — SEDIMENTATION RATE: Sed Rate: 24 mm/hr (ref 0–30)

## 2015-07-18 LAB — ANA: Anti Nuclear Antibody(ANA): NEGATIVE

## 2015-07-24 ENCOUNTER — Encounter: Payer: Self-pay | Admitting: Podiatry

## 2015-07-24 ENCOUNTER — Ambulatory Visit (INDEPENDENT_AMBULATORY_CARE_PROVIDER_SITE_OTHER): Payer: PPO | Admitting: Podiatry

## 2015-07-24 VITALS — BP 124/84 | HR 69 | Resp 12

## 2015-07-24 DIAGNOSIS — M1000 Idiopathic gout, unspecified site: Secondary | ICD-10-CM | POA: Diagnosis not present

## 2015-07-24 DIAGNOSIS — M109 Gout, unspecified: Secondary | ICD-10-CM

## 2015-07-24 NOTE — Patient Instructions (Signed)
Your recent blood test had an elevated uric acid suggestive of possible gout attack in your right foot. At this time the symptoms are reducing in the just self-correct without any further medication Discuss the gout attack with your primary care physician that your next scheduled visit in November 2016  Gout Gout is an inflammatory arthritis caused by a buildup of uric acid crystals in the joints. Uric acid is a chemical that is normally present in the blood. When the level of uric acid in the blood is too high it can form crystals that deposit in your joints and tissues. This causes joint redness, soreness, and swelling (inflammation). Repeat attacks are common. Over time, uric acid crystals can form into masses (tophi) near a joint, destroying bone and causing disfigurement. Gout is treatable and often preventable. CAUSES  The disease begins with elevated levels of uric acid in the blood. Uric acid is produced by your body when it breaks down a naturally found substance called purines. Certain foods you eat, such as meats and fish, contain high amounts of purines. Causes of an elevated uric acid level include:  Being passed down from parent to child (heredity).  Diseases that cause increased uric acid production (such as obesity, psoriasis, and certain cancers).  Excessive alcohol use.  Diet, especially diets rich in meat and seafood.  Medicines, including certain cancer-fighting medicines (chemotherapy), water pills (diuretics), and aspirin.  Chronic kidney disease. The kidneys are no longer able to remove uric acid well.  Problems with metabolism. Conditions strongly associated with gout include:  Obesity.  High blood pressure.  High cholesterol.  Diabetes. Not everyone with elevated uric acid levels gets gout. It is not understood why some people get gout and others do not. Surgery, joint injury, and eating too much of certain foods are some of the factors that can lead to gout  attacks. SYMPTOMS   An attack of gout comes on quickly. It causes intense pain with redness, swelling, and warmth in a joint.  Fever can occur.  Often, only one joint is involved. Certain joints are more commonly involved:  Base of the big toe.  Knee.  Ankle.  Wrist.  Finger. Without treatment, an attack usually goes away in a few days to weeks. Between attacks, you usually will not have symptoms, which is different from many other forms of arthritis. DIAGNOSIS  Your caregiver will suspect gout based on your symptoms and exam. In some cases, tests may be recommended. The tests may include:  Blood tests.  Urine tests.  X-rays.  Joint fluid exam. This exam requires a needle to remove fluid from the joint (arthrocentesis). Using a microscope, gout is confirmed when uric acid crystals are seen in the joint fluid. TREATMENT  There are two phases to gout treatment: treating the sudden onset (acute) attack and preventing attacks (prophylaxis).  Treatment of an Acute Attack.  Medicines are used. These include anti-inflammatory medicines or steroid medicines.  An injection of steroid medicine into the affected joint is sometimes necessary.  The painful joint is rested. Movement can worsen the arthritis.  You may use warm or cold treatments on painful joints, depending which works best for you.  Treatment to Prevent Attacks.  If you suffer from frequent gout attacks, your caregiver may advise preventive medicine. These medicines are started after the acute attack subsides. These medicines either help your kidneys eliminate uric acid from your body or decrease your uric acid production. You may need to stay on these medicines for  a very long time.  The early phase of treatment with preventive medicine can be associated with an increase in acute gout attacks. For this reason, during the first few months of treatment, your caregiver may also advise you to take medicines usually used  for acute gout treatment. Be sure you understand your caregiver's directions. Your caregiver may make several adjustments to your medicine dose before these medicines are effective.  Discuss dietary treatment with your caregiver or dietitian. Alcohol and drinks high in sugar and fructose and foods such as meat, poultry, and seafood can increase uric acid levels. Your caregiver or dietitian can advise you on drinks and foods that should be limited. HOME CARE INSTRUCTIONS   Do not take aspirin to relieve pain. This raises uric acid levels.  Only take over-the-counter or prescription medicines for pain, discomfort, or fever as directed by your caregiver.  Rest the joint as much as possible. When in bed, keep sheets and blankets off painful areas.  Keep the affected joint raised (elevated).  Apply warm or cold treatments to painful joints. Use of warm or cold treatments depends on which works best for you.  Use crutches if the painful joint is in your leg.  Drink enough fluids to keep your urine clear or pale yellow. This helps your body get rid of uric acid. Limit alcohol, sugary drinks, and fructose drinks.  Follow your dietary instructions. Pay careful attention to the amount of protein you eat. Your daily diet should emphasize fruits, vegetables, whole grains, and fat-free or low-fat milk products. Discuss the use of coffee, vitamin C, and cherries with your caregiver or dietitian. These may be helpful in lowering uric acid levels.  Maintain a healthy body weight. SEEK MEDICAL CARE IF:   You develop diarrhea, vomiting, or any side effects from medicines.  You do not feel better in 24 hours, or you are getting worse. SEEK IMMEDIATE MEDICAL CARE IF:   Your joint becomes suddenly more tender, and you have chills or a fever. MAKE SURE YOU:   Understand these instructions.  Will watch your condition.  Will get help right away if you are not doing well or get worse. Document Released:  10/10/2000 Document Revised: 02/27/2014 Document Reviewed: 05/26/2012 [facility] Patient Information 2015 [place], [place]. This information is not intended to replace advice given to you by your health care provider. Make sure you discuss any questions you have with your health care provider.

## 2015-07-24 NOTE — Progress Notes (Signed)
   Subjective:    Patient ID: Angela Clay, female    DOB: 07-26-1934, 81 y.o.   MRN: [id number]  HPI This patient presents for the follow-up visit of 07/17/2015. At that initial presentation patient had approximately 2 week history of pain and swelling localized to the right great toe joint area. The symptoms have gradually reducing over time with reduction of standing and walking. At this time patient is describes some residual swelling in the right foot, however, denies any pain in the foot at this time. An arthritis screening panel was ordered on the visit of 07/17/2015   Review of Systems  Cardiovascular: Positive for leg swelling.       Objective:   Physical Exam Pleasant orientated 3  Vascular: Mild nonpitting edema dorsal aspect of right foot No calf pain or calf edema right and left DP and PT pulses 2/4 bilaterally Capillary reflex immediate bilaterally  Neurological: Ankle reflex equal and reactive bilaterally Vibratory sensation intact bilaterally  Dermatological: Well-healed surgical scars dorsal medial first MPJ right No open skin lesions noted bilaterally  Musculoskeletal: There is no tenderness i on range of motion right first MPJ are palpable tenderness  Arthritis screening panel dated 07/17/2015 And a normal  C-reactive protein normal range Rheumatoid factor normal range Sedimentation rate normal range Uric acid elevated at 10.3 normal range 2.4-7       Assessment & Plan:   Assessment: Resolving gouty arthritis right first MPJ  Plan: At this time I reviewed the results of the previous exam, today's exam and the results of her lab. I made aware that her uric acid was elevated and was most likely the cause for right foot pain which is gradually improving over time. I recommended that she adjust her activity to her tolerance. I gave her copies of the lab and suggested that she contact her primary care physician with her next schedule visit November  2016 and make him aware that she had a gout attack in her right foot that resolved. Unless she has a gout flare and calls again no active treatment will be provided as this apparently is her first episode. If she would call again complaining of a gout flare I will prescribe colchicine forward to take one daily until gout flare ends  Reappoint at patient's request

## 2015-07-26 ENCOUNTER — Telehealth: Payer: Self-pay | Admitting: Internal Medicine

## 2015-07-26 DIAGNOSIS — M79673 Pain in unspecified foot: Secondary | ICD-10-CM

## 2015-07-26 NOTE — Telephone Encounter (Signed)
yes, please put the order ------ uric acid (dx foot pain)

## 2015-07-26 NOTE — Telephone Encounter (Signed)
Relation to OF:BPZW Call back number:[phone]   Reason for call:  Patient would like to know when she comes in November for TSH, can she have her uric acid levels tested based on her last results.

## 2015-07-26 NOTE — Telephone Encounter (Signed)
Noted. Will inform Dr. Larose Kells.

## 2015-07-27 NOTE — Addendum Note (Signed)
Addended by: Wilfrid Lund on: 07/27/2015 08:01 AM   Modules accepted: Orders

## 2015-07-27 NOTE — Telephone Encounter (Signed)
Uric acid ordered.

## 2015-09-06 ENCOUNTER — Other Ambulatory Visit (INDEPENDENT_AMBULATORY_CARE_PROVIDER_SITE_OTHER): Payer: PPO

## 2015-09-06 DIAGNOSIS — M79673 Pain in unspecified foot: Secondary | ICD-10-CM | POA: Diagnosis not present

## 2015-09-06 DIAGNOSIS — E039 Hypothyroidism, unspecified: Secondary | ICD-10-CM | POA: Diagnosis not present

## 2015-09-06 LAB — TSH: TSH: 0.45 u[IU]/mL (ref 0.35–4.50)

## 2015-09-06 LAB — URIC ACID: Uric Acid, Serum: 8.7 mg/dL — ABNORMAL HIGH (ref 2.4–7.0)

## 2015-10-01 LAB — HM MAMMOGRAPHY

## 2015-10-02 ENCOUNTER — Encounter: Payer: Self-pay | Admitting: Internal Medicine

## 2015-10-28 HISTORY — PX: BELPHAROPTOSIS REPAIR: SHX369

## 2015-12-04 ENCOUNTER — Ambulatory Visit (INDEPENDENT_AMBULATORY_CARE_PROVIDER_SITE_OTHER): Payer: PPO | Admitting: Internal Medicine

## 2015-12-04 ENCOUNTER — Encounter: Payer: Self-pay | Admitting: Internal Medicine

## 2015-12-04 VITALS — BP 126/78 | HR 90 | Temp 97.9°F | Ht 65.0 in | Wt 168.5 lb

## 2015-12-04 DIAGNOSIS — B029 Zoster without complications: Secondary | ICD-10-CM

## 2015-12-04 MED ORDER — VALACYCLOVIR HCL 1 G PO TABS: 1000.0000 mg | ORAL_TABLET | Freq: Three times a day (TID) | ORAL | Status: DC

## 2015-12-04 NOTE — Patient Instructions (Signed)
Take the antibiotic: Valtrex as prescribed  Call anytime if you have severe pain, the rash spreads, you have fever or chills.  Also call if the pain persists  Shingles Shingles, which is also known as herpes zoster, is an infection that causes a painful skin rash and fluid-filled blisters. Shingles is not related to genital herpes, which is a sexually transmitted infection.   Shingles only develops in people who:  Have had chickenpox.  Have received the chickenpox vaccine. (This is rare.) CAUSES Shingles is caused by varicella-zoster virus (VZV). This is the same virus that causes chickenpox. After exposure to VZV, the virus stays in the body in an inactive (dormant) state. Shingles develops if the virus reactivates. This can happen many years after the initial exposure to VZV. It is not known what causes this virus to reactivate. RISK FACTORS People who have had chickenpox or received the chickenpox vaccine are at risk for shingles. Infection is more common in people who:  Are older than age 4.  Have a weakened defense (immune) system, such as those with HIV, AIDS, or cancer.  Are taking medicines that weaken the immune system, such as transplant medicines.  Are under great stress. SYMPTOMS Early symptoms of this condition include itching, tingling, and pain in an area on your skin. Pain may be described as burning, stabbing, or throbbing. A few days or weeks after symptoms start, a painful red rash appears, usually on one side of the body in a bandlike or beltlike pattern. The rash eventually turns into fluid-filled blisters that break open, scab over, and dry up in about 2-3 weeks. At any time during the infection, you may also develop:  A fever.  Chills.  A headache.  An upset stomach. DIAGNOSIS This condition is diagnosed with a skin exam. Sometimes, skin or fluid samples are taken from the blisters before a diagnosis is made. These samples are examined under a microscope  or sent to a lab for testing. TREATMENT There is no specific cure for this condition. Your health care provider will probably prescribe medicines to help you manage pain, recover more quickly, and avoid long-term problems. Medicines may include:  Antiviral drugs.  Anti-inflammatory drugs.  Pain medicines. If the area involved is on your face, you may be referred to a specialist, such as an eye doctor (ophthalmologist) or an ear, nose, and throat (ENT) doctor to help you avoid eye problems, chronic pain, or disability. HOME CARE INSTRUCTIONS Medicines  Take medicines only as directed by your health care provider.  Apply an anti-itch or numbing cream to the affected area as directed by your health care provider. Blister and Rash Care  Take a cool bath or apply cool compresses to the area of the rash or blisters as directed by your health care provider. This may help with pain and itching.  Keep your rash covered with a loose bandage (dressing). Wear loose-fitting clothing to help ease the pain of material rubbing against the rash.  Keep your rash and blisters clean with mild soap and cool water or as directed by your health care provider.  Check your rash every day for signs of infection. These include redness, swelling, and pain that lasts or increases.  Do not pick your blisters.  Do not scratch your rash. General Instructions  Rest as directed by your health care provider.  Keep all follow-up visits as directed by your health care provider. This is important.  Until your blisters scab over, your infection can cause chickenpox  in people who have never had it or been vaccinated against it. To prevent this from happening, avoid contact with other people, especially:  Babies.  Pregnant women.  Children who have eczema.  Elderly people who have transplants.  People who have chronic illnesses, such as leukemia or AIDS. SEEK MEDICAL CARE IF:  Your pain is not relieved with  prescribed medicines.  Your pain does not get better after the rash heals.  Your rash looks infected. Signs of infection include redness, swelling, and pain that lasts or increases. SEEK IMMEDIATE MEDICAL CARE IF:  The rash is on your face or nose.  You have facial pain, pain around your eye area, or loss of feeling on one side of your face.  You have ear pain or you have ringing in your ear.  You have loss of taste.  Your condition gets worse.   This information is not intended to replace advice given to you by your health care provider. Make sure you discuss any questions you have with your health care provider.   Document Released: 10/13/2005 Document Revised: 11/03/2014 Document Reviewed: 08/24/2014 Elsevier Interactive Patient Education [employer].

## 2015-12-04 NOTE — Progress Notes (Signed)
Subjective:    Patient ID: Angela Clay, female    DOB: 04-Sep-1934, 82 y.o.   MRN: [id number]  DOS:  12/04/2015 Type of visit - description : acute Interval history: Sx started yesterday with a very subtle rash at the left hand and wrist. + pain but  not itching. Also pain at the forearm, like a "cold-cool sensation". Has some discomfort at the left axillary area and L lateral chest   Review of Systems Denies fever chills No SS chest pain, difficulty breathing, nausea or vomiting. No unusual neck pain No injury. She does have some swelling and the right forearm which is chronic.   Past Medical History  Diagnosis Date  . GERD (gastroesophageal reflux disease)     EGD for dysphagia 2000 aprox (-)  . Hyperlipidemia   . Osteopenia   . Hypertension   . Lymphoma ([place])     neck,mlig,unspecified site dx 1970s  . Hypothyroidism   . OA (osteoarthritis)     severe at neck  . Syncope 11-2012    admited . w/u (-)  . Actinic keratosis 05/2015    Right mid central dorsal hand    Past Surgical History  Procedure Laterality Date  . Tubal ligation  1972  . Cholecystectomy  02/24/06  . Appendectomy    . Thyroidectomy    . Tonsillectomy and adenoidectomy  1949    Social History   Social History  . Marital Status: Married    Spouse Name: N/A  . Number of Children: 3  . Years of Education: N/A   Occupational History  . retired    Social History Main Topics  . Smoking status: Never Smoker   . Smokeless tobacco: Never Used  . Alcohol Use: No  . Drug Use: No  . Sexual Activity:    Partners: Male    Birth Control/ Protection: Post-menopausal   Other Topics Concern  . Not on file   Social History Narrative   3 kids, 8 GK              Medication List       This list is accurate as of: 12/04/15  8:23 PM.  Always use your most recent med list.               acetaminophen 500 MG tablet  Commonly known as:  TYLENOL  Take 500 mg by mouth every 6 (six) hours as  needed. For pain     ALEVE PO  Take by mouth as needed.     aspirin 81 MG tablet  Take 81 mg by mouth daily.     atorvastatin 40 MG tablet  Commonly known as:  LIPITOR  Take 1 tablet (40 mg total) by mouth at bedtime.     calcium-vitamin D 250-125 MG-UNIT tablet  Commonly known as:  OSCAL  Take 1 tablet by mouth.     clobetasol cream 0.05 %  Commonly known as:  TEMOVATE  Apply 1 application topically 3 (three) times a week. Reported on 12/04/2015     hydrochlorothiazide 25 MG tablet  Commonly known as:  HYDRODIURIL  Take 1 tablet (25 mg total) by mouth daily.     levothyroxine 125 MCG tablet  Commonly known as:  SYNTHROID, LEVOTHROID  Take 1 tablet (125 mcg total) by mouth daily.     omeprazole 20 MG capsule  Commonly known as:  PRILOSEC  Take 1 capsule (20 mg total) by mouth daily as needed.     triamcinolone  55 MCG/ACT Aero nasal inhaler  Commonly known as:  NASACORT  Place 2 sprays into the nose daily as needed.     valACYclovir 1000 MG tablet  Commonly known as:  VALTREX  Take 1 tablet (1,000 mg total) by mouth 3 (three) times daily.           Objective:   Physical Exam  Musculoskeletal:       Arms:  BP 126/78 mmHg  Pulse 90  Temp(Src) 97.9 F (36.6 C) (Oral)  Ht 5' 5"  (1.651 m)  Wt 168 lb 8 oz (76.431 kg)  BMI 28.04 kg/m2  SpO2 94% General:   Well developed, well nourished . NAD.  HEENT:  Normocephalic . Face symmetric, atraumatic. Neck: No mass, at baseline. Left axillary area: No LAD is Lungs:  CTA B Normal respiratory effort, no intercostal retractions, no accessory muscle use. Heart: RRR,  no murmur.  No pretibial edema bilaterally    Skin-- see graphic MSK: Wrist and elbow without synovitis. Left forearm is a slightly larger in circumference compared to the right, a chronic finding per pt  Neurologic:  alert & oriented X3.  Speech normal, gait appropriate for age and unassisted Psych--  Cognition and judgment appear intact.    Cooperative with normal attention span and concentration.  Behavior appropriate. No anxious or depressed appearing.      Assessment & Plan:   Assessment Prediabetes Hyperlipidemia Hypertension Hypothyroidism CV --Carotid artery disease DJD, severe at the neck GERD, dysphagia remotely Osteopenia Lymphoma,  Neck, 1970s Syncope, admitted-2014, workup negative Shingles: 10/02/2016, left arm  PLAN: Shingles: Left arm rash and "pain" likely from shingles, she also has and discomfort at the L lateral chest but there is no rash in that area. The left forearm is a slightly swollen but has a chronic finding. Plan: Treat as shingles, see instructions

## 2015-12-04 NOTE — Progress Notes (Signed)
Pre visit review using our clinic review tool, if applicable. No additional management support is needed unless otherwise documented below in the visit note. 

## 2015-12-06 ENCOUNTER — Telehealth: Payer: Self-pay | Admitting: Internal Medicine

## 2015-12-06 NOTE — Telephone Encounter (Signed)
Spoke w/ Pt, informed her of recommendations. Pt agreed to let us know if rash continues to spread and will schedule an appt. For now she states she would rather monitor it.

## 2015-12-06 NOTE — Telephone Encounter (Signed)
Continue Valtrex, Tylenol.  If the rash seems severe to the patient, bring her again tomorrow otherwise schedule a visit for next week

## 2015-12-06 NOTE — Telephone Encounter (Signed)
Caller name: Self  Can be reached: [phone]   Reason for call: Saw Dr. Larose Kells on Tuesday for Shingles and was informed to call back if they spread. States that now she is seeing it on her elbow. Has been taking Tylenol for her pain. Plse adv

## 2015-12-06 NOTE — Telephone Encounter (Signed)
Please advise 

## 2016-01-10 ENCOUNTER — Encounter: Payer: Self-pay | Admitting: Internal Medicine

## 2016-01-10 ENCOUNTER — Ambulatory Visit (INDEPENDENT_AMBULATORY_CARE_PROVIDER_SITE_OTHER): Payer: PPO | Admitting: Internal Medicine

## 2016-01-10 VITALS — BP 126/74 | HR 66 | Temp 98.1°F | Ht 65.0 in | Wt 168.1 lb

## 2016-01-10 DIAGNOSIS — I779 Disorder of arteries and arterioles, unspecified: Secondary | ICD-10-CM

## 2016-01-10 DIAGNOSIS — Z0000 Encounter for general adult medical examination without abnormal findings: Secondary | ICD-10-CM | POA: Diagnosis not present

## 2016-01-10 DIAGNOSIS — E785 Hyperlipidemia, unspecified: Secondary | ICD-10-CM

## 2016-01-10 DIAGNOSIS — E039 Hypothyroidism, unspecified: Secondary | ICD-10-CM

## 2016-01-10 DIAGNOSIS — I10 Essential (primary) hypertension: Secondary | ICD-10-CM

## 2016-01-10 DIAGNOSIS — I739 Peripheral vascular disease, unspecified: Secondary | ICD-10-CM

## 2016-01-10 DIAGNOSIS — R739 Hyperglycemia, unspecified: Secondary | ICD-10-CM

## 2016-01-10 DIAGNOSIS — Z09 Encounter for follow-up examination after completed treatment for conditions other than malignant neoplasm: Secondary | ICD-10-CM

## 2016-01-10 LAB — LIPID PANEL
Cholesterol: 145 mg/dL (ref 0–200)
HDL: 44.2 mg/dL (ref >39.00)
LDL Cholesterol: 66 mg/dL (ref 0–99)
NonHDL: 101.01
Total CHOL/HDL Ratio: 3
Triglycerides: 176 mg/dL — ABNORMAL HIGH (ref 0.0–149.0)
VLDL: 35.2 mg/dL (ref 0.0–40.0)

## 2016-01-10 LAB — BASIC METABOLIC PANEL
BUN: 16 mg/dL (ref 6–23)
CO2: 31 mEq/L (ref 19–32)
Calcium: 9.7 mg/dL (ref 8.4–10.5)
Chloride: 100 mEq/L (ref 96–112)
Creatinine, Ser: 0.88 mg/dL (ref 0.40–1.20)
GFR: 65.69 mL/min (ref >60.00)
Glucose, Bld: 103 mg/dL — ABNORMAL HIGH (ref 70–99)
Potassium: 3.8 mEq/L (ref 3.5–5.1)
Sodium: 138 mEq/L (ref 135–145)

## 2016-01-10 LAB — TSH: TSH: 0.13 u[IU]/mL — ABNORMAL LOW (ref 0.35–4.50)

## 2016-01-10 LAB — HEMOGLOBIN A1C: Hgb A1c MFr Bld: 6.2 % (ref 4.6–6.5)

## 2016-01-10 LAB — ALT: ALT: 19 U/L (ref 0–35)

## 2016-01-10 LAB — AST: AST: 18 U/L (ref 0–37)

## 2016-01-10 MED ORDER — ATORVASTATIN CALCIUM 40 MG PO TABS: 40.0000 mg | ORAL_TABLET | Freq: Every day | ORAL | Status: DC

## 2016-01-10 MED ORDER — HYDROCHLOROTHIAZIDE 25 MG PO TABS: 25.0000 mg | ORAL_TABLET | Freq: Every day | ORAL | Status: DC

## 2016-01-10 MED ORDER — OMEPRAZOLE 20 MG PO CPDR: 20.0000 mg | DELAYED_RELEASE_CAPSULE | Freq: Every day | ORAL | Status: DC | PRN

## 2016-01-10 NOTE — Assessment & Plan Note (Addendum)
Td 11-09; Pneumina shot 2008; prevnar 2015; shingles shot 02-2013   [facility]--10/2012--Dr Brodie--benign polyp--no recall needed   gynecology : used to see Dr Lisa [name] (she left), multiple previous normal PAPs, no h/o abnormal PAPs: no further screening per guidelines, pr agreed 02-2011, vulvar biopsy was negative ,uses clobetasol prn on that area, if rash is persistent-changes will need to see gyn again  [facility]--09-2015 Benign   Cont healthy life style

## 2016-01-10 NOTE — Patient Instructions (Signed)
GO TO THE LAB :      Get the blood work     GO TO THE FRONT DESK Schedule your next appointment for a  Routine visit When?   6 Months Fasting?  No       Fall Prevention and Home Safety Falls cause injuries and can affect all age groups. It is possible to use preventive measures to significantly decrease the likelihood of falls. There are many simple measures which can make your home safer and prevent falls. OUTDOORS  Repair cracks and edges of walkways and driveways.  Remove high doorway thresholds.  Trim shrubbery on the main path into your home.  Have good outside lighting.  Clear walkways of tools, rocks, debris, and clutter.  Check that handrails are not broken and are securely fastened. Both sides of steps should have handrails.  Have leaves, snow, and ice cleared regularly.  Use sand or salt on walkways during winter months.  In the garage, clean up grease or oil spills. BATHROOM  Install night lights.  Install grab bars by the toilet and in the tub and shower.  Use non-skid mats or decals in the tub or shower.  Place a plastic non-slip stool in the shower to sit on, if needed.  Keep floors dry and clean up all water on the floor immediately.  Remove soap buildup in the tub or shower on a regular basis.  Secure bath mats with non-slip, double-sided rug tape.  Remove throw rugs and tripping hazards from the floors. BEDROOMS  Install night lights.  Make sure a bedside light is easy to reach.  Do not use oversized bedding.  Keep a telephone by your bedside.  Have a firm chair with side arms to use for getting dressed.  Remove throw rugs and tripping hazards from the floor. KITCHEN  Keep handles on pots and pans turned toward the center of the stove. Use back burners when possible.  Clean up spills quickly and allow time for drying.  Avoid walking on wet floors.  Avoid hot utensils and knives.  Position shelves so they are not too high or  low.  Place commonly used objects within easy reach.  If necessary, use a sturdy step stool with a grab bar when reaching.  Keep electrical cables out of the way.  Do not use floor polish or wax that makes floors slippery. If you must use wax, use non-skid floor wax.  Remove throw rugs and tripping hazards from the floor. STAIRWAYS  Never leave objects on stairs.  Place handrails on both sides of stairways and use them. Fix any loose handrails. Make sure handrails on both sides of the stairways are as long as the stairs.  Check carpeting to make sure it is firmly attached along stairs. Make repairs to worn or loose carpet promptly.  Avoid placing throw rugs at the top or bottom of stairways, or properly secure the rug with carpet tape to prevent slippage. Get rid of throw rugs, if possible.  Have an electrician put in a light switch at the top and bottom of the stairs. OTHER FALL PREVENTION TIPS  Wear low-heel or rubber-soled shoes that are supportive and fit well. Wear closed toe shoes.  When using a stepladder, make sure it is fully opened and both spreaders are firmly locked. Do not climb a closed stepladder.  Add color or contrast paint or tape to grab bars and handrails in your home. Place contrasting color strips on first and last steps.  Learn and use mobility aids as needed. Install an electrical emergency response system.  Turn on lights to avoid dark areas. Replace light bulbs that burn out immediately. Get light switches that glow.  Arrange furniture to create clear pathways. Keep furniture in the same place.  Firmly attach carpet with non-skid or double-sided tape.  Eliminate uneven floor surfaces.  Select a carpet pattern that does not visually hide the edge of steps.  Be aware of all pets. OTHER HOME SAFETY TIPS  Set the water temperature for 120 F (48.8 C).  Keep emergency numbers on or near the telephone.  Keep smoke detectors on every level of the  home and near sleeping areas. Document Released: 10/03/2002 Document Revised: 04/13/2012 Document Reviewed: 01/02/2012 [facility] Patient Information 2015 [place], [place]. This information is not intended to replace advice given to you by your health care provider. Make sure you discuss any questions you have with your health care provider.   Preventive Care for Adults Ages 23 and over  Blood pressure check.** / Every 1 to 2 years.  Lipid and cholesterol check.**/ Every 5 years beginning at age 54.  Lung cancer screening. / Every year if you are aged 69-80 years and have a 30-pack-year history of smoking and currently smoke or have quit within the past 15 years. Yearly screening is stopped once you have quit smoking for at least 15 years or develop a health problem that would prevent you from having lung cancer treatment.  Fecal occult blood test (FOBT) of stool. / Every year beginning at age 40 and continuing until age 8. You may not have to do this test if you get a colonoscopy every 10 years.  Flexible sigmoidoscopy** or colonoscopy.** / Every 5 years for a flexible sigmoidoscopy or every 10 years for a colonoscopy beginning at age 75 and continuing until age 48.  Hepatitis C blood test.** / For all people born from [birth year] through 1963 and any individual with known risks for hepatitis C.  Abdominal aortic aneurysm (AAA) screening.** / A one-time screening for ages 81 to 65 years who are current or former smokers.  Skin self-exam. / Monthly.  Influenza vaccine. / Every year.  Tetanus, diphtheria, and acellular pertussis (Tdap/Td) vaccine.** / 1 dose of Td every 10 years.  Varicella vaccine.** / Consult your health care provider.  Zoster vaccine.** / 1 dose for adults aged 7 years or older.  Pneumococcal 13-valent conjugate (PCV13) vaccine.** / Consult your health care provider.  Pneumococcal polysaccharide (PPSV23) vaccine.** / 1 dose for all adults aged 35 years and  older.  Meningococcal vaccine.** / Consult your health care provider.  Hepatitis A vaccine.** / Consult your health care provider.  Hepatitis B vaccine.** / Consult your health care provider.  Haemophilus influenzae type b (Hib) vaccine.** / Consult your health care provider. **Family history and personal history of risk and conditions may change your health care provider's recommendations. Document Released: 12/09/2001 Document Revised: 10/18/2013 Document Reviewed: 03/10/2011 [facility] Patient Information 2015 [place], [place]. This information is not intended to replace advice given to you by your health care provider. Make sure you discuss any questions you have with your health care provider.

## 2016-01-10 NOTE — Progress Notes (Signed)
Subjective:    Patient ID: Angela Clay, female    DOB: 1934/05/22, 82 y.o.   MRN: [id number]  DOS:  01/10/2016 Type of visit - description : Complete physical exam Interval history: Prediabetes: Doing well with diet and exercise HTN: BP today is excellent, due for a BMP High cholesterol: On statins, due for labs Shingles: Resolved, she did mention tingling in the left finger tips occasionally, mild, usually in the mornings. Denies other symptoms such as difficulty with her gait, neck pain or any other paresthesias. Hypothyroidism: Good medication compliance GERD: Takes medications as needed   Review of Systems  Constitutional: No fever. No chills. No unexplained wt changes. No unusual sweats  HEENT: No dental problems, no ear discharge, no facial swelling, no voice changes. No eye discharge, no eye  redness , no  intolerance to light   Respiratory: No wheezing , no  difficulty breathing. Mild dry cough, for years, not better worse, no wheezing. No chest pain or difficulty breathing.  Cardiovascular: No CP, no leg swelling , no  Palpitations  GI: no nausea, no vomiting, no diarrhea , no  abdominal pain.  No blood in the stools. Occasional dysphagia, for years, not getting better- worse.   Endocrine: No polyphagia, no polyuria , no polydipsia  GU: No dysuria, gross hematuria, difficulty urinating. No urinary urgency, no frequency.  Musculoskeletal: No joint swellings or unusual aches or pains  Skin: No change in the color of the skin, palor , no  Rash  Allergic, immunologic: No environmental allergies , no  food allergies  Neurological: No dizziness no  syncope. No headaches. No diplopia, no slurred, no slurred speech, no motor deficits, no facial  Numbness  Hematological: No enlarged lymph nodes, no easy bruising , no unusual bleedings  Psychiatry: No suicidal ideas, no hallucinations, no beavior problems, no confusion.  No unusual/severe anxiety, no depression   Past  Medical History  Diagnosis Date  . GERD (gastroesophageal reflux disease)     EGD for dysphagia 2000 aprox (-)  . Hyperlipidemia   . Osteopenia   . Hypertension   . Lymphoma ([place])     neck,mlig,unspecified site dx 1970s  . Hypothyroidism   . OA (osteoarthritis)     severe at neck  . Syncope 11-2012    admited . w/u (-)  . Actinic keratosis 05/2015    Right mid central dorsal hand    Past Surgical History  Procedure Laterality Date  . Tubal ligation  1972  . Cholecystectomy  02/24/06  . Appendectomy    . Thyroidectomy    . Tonsillectomy and adenoidectomy  1949    Social History   Social History  . Marital Status: Married    Spouse Name: Iona Beard  . Number of Children: 3  . Years of Education: N/A   Occupational History  . retired    Social History Main Topics  . Smoking status: Never Smoker   . Smokeless tobacco: Never Used  . Alcohol Use: No  . Drug Use: No  . Sexual Activity:    Partners: Male    Birth Control/ Protection: Post-menopausal   Other Topics Concern  . Not on file   Social History Narrative   3 kids, 8 GK   Lives w/ husband           Family History  Problem Relation Age of Onset  . Diabetes Neg Hx   . Colon cancer Neg Hx   . Breast cancer Neg Hx   .  Stroke Mother 10  . Coronary artery disease Father     MI age 36  . Deep vein thrombosis Brother     PE at age 12  . Heart failure Sister        Medication List       This list is accurate as of: 01/10/16 11:59 PM.  Always use your most recent med list.               acetaminophen 500 MG tablet  Commonly known as:  TYLENOL  Take 500 mg by mouth every 6 (six) hours as needed. For pain     ALEVE PO  Take by mouth as needed.     aspirin 81 MG tablet  Take 81 mg by mouth daily.     atorvastatin 40 MG tablet  Commonly known as:  LIPITOR  Take 1 tablet (40 mg total) by mouth at bedtime.     calcium-vitamin D 250-125 MG-UNIT tablet  Commonly known as:  OSCAL  Take 1 tablet by  mouth.     clobetasol cream 0.05 %  Commonly known as:  TEMOVATE  Apply 1 application topically 2 (two) times daily as needed.     hydrochlorothiazide 25 MG tablet  Commonly known as:  HYDRODIURIL  Take 1 tablet (25 mg total) by mouth daily.     levothyroxine 112 MCG tablet  Commonly known as:  SYNTHROID, LEVOTHROID  Take 1 tablet (112 mcg total) by mouth daily before breakfast.     omeprazole 20 MG capsule  Commonly known as:  PRILOSEC  Take 1 capsule (20 mg total) by mouth daily as needed.     triamcinolone 55 MCG/ACT Aero nasal inhaler  Commonly known as:  NASACORT  Place 2 sprays into the nose daily as needed.           Objective:   Physical Exam BP 126/74 mmHg  Pulse 66  Temp(Src) 98.1 F (36.7 C) (Oral)  Ht 5' 5"  (1.651 m)  Wt 168 lb 2 oz (76.261 kg)  BMI 27.98 kg/m2  SpO2 97%  General:   Well developed, well nourished . NAD.  Neck: No  thyromegaly ,   carotid pulses present, severe deformity due to DJD again noted. HEENT:  Normocephalic . Face symmetric, atraumatic Lungs:  CTA B Normal respiratory effort, no intercostal retractions, no accessory muscle use. Heart: RRR,  no murmur.  No pretibial edema bilaterally  Abdomen:  Not distended, soft, non-tender. No rebound or rigidity.   Skin: Exposed areas without rash. Not pale. Not jaundice Neurologic:  alert & oriented X3.  Speech normal, gait appropriate for age and unassisted Strength symmetric and appropriate for age. DTRs symmetric, normal coordination.   Psych: Cognition and judgment appear intact.  Cooperative with normal attention span and concentration.  Behavior appropriate. No anxious or depressed appearing.    Assessment & Plan:   Assessment Prediabetes Hyperlipidemia Hypertension Hypothyroidism Carotid artery disease [place] 11-2014: 60-79% RICA stenosis. 94-70% LICA stenosis DJD, severe at the neck GERD, dysphagia remotely Osteopenia: T score -1.7 (01-2015) on ca- vitamin D Lymphoma,   Neck, 1970s Syncope, admitted-2014, workup negative Shingles: 10/02/2016, left arm  PLAN: Prediabetes: Has a healthy lifestyle, check A1c Hyperlipidemia: Continue Lipitor, check FLP, AST, ALT HTN: on HCTZ, check a BMP Hypothyroidism: Continue Synthroid, check a TSH Carotid disease: Rx a carotid ultrasound Recent shingles, improved Paresthesias, left fingers: Related to shingles? Related to neck DJD? For now recommend observation. RTC 6 months

## 2016-01-10 NOTE — Progress Notes (Signed)
Pre visit review using our clinic review tool, if applicable. No additional management support is needed unless otherwise documented below in the visit note. 

## 2016-01-11 MED ORDER — LEVOTHYROXINE SODIUM 112 MCG PO TABS: 112.0000 ug | ORAL_TABLET | Freq: Every day | ORAL | Status: DC

## 2016-01-11 NOTE — Assessment & Plan Note (Signed)
Prediabetes: Has a healthy lifestyle, check A1c Hyperlipidemia: Continue Lipitor, check FLP, AST, ALT HTN: on HCTZ, check a BMP Hypothyroidism: Continue Synthroid, check a TSH Carotid disease: Rx a carotid ultrasound Recent shingles, improved Paresthesias, left fingers: Related to shingles? Related to neck DJD? For now recommend observation. RTC 6 months

## 2016-01-22 DIAGNOSIS — Z85828 Personal history of other malignant neoplasm of skin: Secondary | ICD-10-CM | POA: Diagnosis not present

## 2016-01-22 DIAGNOSIS — L905 Scar conditions and fibrosis of skin: Secondary | ICD-10-CM | POA: Diagnosis not present

## 2016-01-22 DIAGNOSIS — L821 Other seborrheic keratosis: Secondary | ICD-10-CM | POA: Diagnosis not present

## 2016-03-10 ENCOUNTER — Encounter: Payer: Self-pay | Admitting: Internal Medicine

## 2016-03-11 ENCOUNTER — Other Ambulatory Visit: Payer: Self-pay

## 2016-03-11 DIAGNOSIS — I739 Peripheral vascular disease, unspecified: Principal | ICD-10-CM

## 2016-03-11 DIAGNOSIS — I779 Disorder of arteries and arterioles, unspecified: Secondary | ICD-10-CM

## 2016-03-17 ENCOUNTER — Other Ambulatory Visit (INDEPENDENT_AMBULATORY_CARE_PROVIDER_SITE_OTHER): Payer: PPO

## 2016-03-17 DIAGNOSIS — E039 Hypothyroidism, unspecified: Secondary | ICD-10-CM | POA: Diagnosis not present

## 2016-03-18 LAB — TSH: TSH: 0.55 u[IU]/mL (ref 0.35–4.50)

## 2016-03-21 ENCOUNTER — Ambulatory Visit (HOSPITAL_COMMUNITY)
Admission: RE | Admit: 2016-03-21 | Discharge: 2016-03-21 | Disposition: A | Discharge status: Home / Self Care | Payer: PPO | Source: Ambulatory Visit | Attending: Urology | Admitting: Urology

## 2016-03-21 DIAGNOSIS — I779 Disorder of arteries and arterioles, unspecified: Secondary | ICD-10-CM | POA: Insufficient documentation

## 2016-03-21 DIAGNOSIS — K219 Gastro-esophageal reflux disease without esophagitis: Secondary | ICD-10-CM | POA: Insufficient documentation

## 2016-03-21 DIAGNOSIS — I739 Peripheral vascular disease, unspecified: Secondary | ICD-10-CM

## 2016-03-21 DIAGNOSIS — I10 Essential (primary) hypertension: Secondary | ICD-10-CM | POA: Diagnosis not present

## 2016-03-21 DIAGNOSIS — I6523 Occlusion and stenosis of bilateral carotid arteries: Secondary | ICD-10-CM | POA: Diagnosis not present

## 2016-03-21 DIAGNOSIS — E785 Hyperlipidemia, unspecified: Secondary | ICD-10-CM | POA: Diagnosis not present

## 2016-04-08 ENCOUNTER — Other Ambulatory Visit: Payer: Self-pay | Admitting: Internal Medicine

## 2016-04-16 DIAGNOSIS — H02834 Dermatochalasis of left upper eyelid: Secondary | ICD-10-CM | POA: Diagnosis not present

## 2016-04-16 DIAGNOSIS — H2513 Age-related nuclear cataract, bilateral: Secondary | ICD-10-CM | POA: Diagnosis not present

## 2016-04-16 DIAGNOSIS — Z0100 Encounter for examination of eyes and vision without abnormal findings: Secondary | ICD-10-CM | POA: Diagnosis not present

## 2016-04-16 DIAGNOSIS — H02831 Dermatochalasis of right upper eyelid: Secondary | ICD-10-CM | POA: Diagnosis not present

## 2016-05-12 ENCOUNTER — Telehealth: Payer: Self-pay | Admitting: Internal Medicine

## 2016-05-12 NOTE — Telephone Encounter (Signed)
Forwarded to Dr. Larose Kells

## 2016-05-12 NOTE — Telephone Encounter (Signed)
Pt dropped off a copy of a declaration of desire for a natural death, to be scanned in her file, documents were placed in [place] at front office.

## 2016-06-16 DIAGNOSIS — H02831 Dermatochalasis of right upper eyelid: Secondary | ICD-10-CM | POA: Diagnosis not present

## 2016-06-16 DIAGNOSIS — H02834 Dermatochalasis of left upper eyelid: Secondary | ICD-10-CM | POA: Diagnosis not present

## 2016-07-07 ENCOUNTER — Ambulatory Visit (INDEPENDENT_AMBULATORY_CARE_PROVIDER_SITE_OTHER): Payer: PPO | Admitting: Internal Medicine

## 2016-07-07 ENCOUNTER — Encounter: Payer: Self-pay | Admitting: Internal Medicine

## 2016-07-07 VITALS — BP 124/66 | HR 83 | Temp 98.2°F | Resp 12 | Ht 65.0 in | Wt 171.1 lb

## 2016-07-07 DIAGNOSIS — E785 Hyperlipidemia, unspecified: Secondary | ICD-10-CM

## 2016-07-07 DIAGNOSIS — I10 Essential (primary) hypertension: Secondary | ICD-10-CM | POA: Diagnosis not present

## 2016-07-07 DIAGNOSIS — Z23 Encounter for immunization: Secondary | ICD-10-CM

## 2016-07-07 DIAGNOSIS — E039 Hypothyroidism, unspecified: Secondary | ICD-10-CM | POA: Diagnosis not present

## 2016-07-07 DIAGNOSIS — Z09 Encounter for follow-up examination after completed treatment for conditions other than malignant neoplasm: Secondary | ICD-10-CM

## 2016-07-07 NOTE — Progress Notes (Signed)
Pre visit review using our clinic review tool, if applicable. No additional management support is needed unless otherwise documented below in the visit note. 

## 2016-07-07 NOTE — Progress Notes (Addendum)
Subjective:    Patient ID: Angela Clay, female    DOB: 05/06/1934, 82 y.o.   MRN: [id number]  DOS:  07/07/2016 Type of visit - description :  Routine office visit Interval history: Since the last time she was here, she is doing well. Good medication compliance without apparent side effects. Labs reviewed, not due for any additional testing today.   Review of Systems Reports lower extremity edema, worse at the end of the day. Denies chest pain or difficulty breathing No nausea, vomiting, diarrhea  Past Medical History:  Diagnosis Date  . Actinic keratosis 05/2015   Right mid central dorsal hand  . GERD (gastroesophageal reflux disease)    EGD for dysphagia 2000 aprox (-)  . Hyperlipidemia   . Hypertension   . Hypothyroidism   . Lymphoma ([place])    neck,mlig,unspecified site dx 1970s  . OA (osteoarthritis)    severe at neck  . Osteopenia   . Syncope 11-2012   admited . w/u (-)    Past Surgical History:  Procedure Laterality Date  . APPENDECTOMY    . CHOLECYSTECTOMY  02/24/06  . THYROIDECTOMY    . TONSILLECTOMY AND ADENOIDECTOMY  1949  . TUBAL LIGATION  1972    Social History   Social History  . Marital status: Married    Spouse name: Iona Beard  . Number of children: 3  . Years of education: N/A   Occupational History  . retired    Social History Main Topics  . Smoking status: Never Smoker  . Smokeless tobacco: Never Used  . Alcohol use No  . Drug use: No  . Sexual activity: Yes    Partners: Male    Birth control/ protection: Post-menopausal   Other Topics Concern  . Not on file   Social History Narrative   3 kids, 8 GK   Lives w/ husband              Medication List       Accurate as of 07/07/16 11:59 PM. Always use your most recent med list.          acetaminophen 500 MG tablet Commonly known as:  TYLENOL Take 500 mg by mouth every 6 (six) hours as needed. For pain   aspirin 81 MG tablet Take 81 mg by mouth daily.   atorvastatin 40 MG  tablet Commonly known as:  LIPITOR Take 1 tablet (40 mg total) by mouth at bedtime.   calcium-vitamin D 250-125 MG-UNIT tablet Commonly known as:  OSCAL Take 1 tablet by mouth.   hydrochlorothiazide 25 MG tablet Commonly known as:  HYDRODIURIL Take 1 tablet (25 mg total) by mouth daily.   levothyroxine 112 MCG tablet Commonly known as:  SYNTHROID, LEVOTHROID Take 1 tablet (112 mcg total) by mouth daily before breakfast.   omeprazole 20 MG capsule Commonly known as:  PRILOSEC Take 1 capsule (20 mg total) by mouth daily as needed.   triamcinolone 55 MCG/ACT Aero nasal inhaler Commonly known as:  NASACORT Place 2 sprays into the nose daily as needed.          Objective:   Physical Exam BP 124/66 (BP Location: Left Arm, Patient Position: Sitting, Cuff Size: Normal)   Pulse 83   Temp 98.2 F (36.8 C) (Oral)   Resp 12   Ht 5' 5"  (1.651 m)   Wt 171 lb 2 oz (77.6 kg)   SpO2 98%   BMI 28.48 kg/m  General:   Well developed, well nourished .  NAD.  HEENT:  Normocephalic . Face symmetric, atraumatic Lungs:  CTA B Normal respiratory effort, no intercostal retractions, no accessory muscle use. Heart: RRR,  no murmur.  + Pretibial edema, slightly more noticeable on the right, calves symmetric and no TTP Skin: Not pale. Not jaundice Neurologic:  alert & oriented X3.  Speech normal, gait appropriate for age and unassisted Psych--  Cognition and judgment appear intact.  Cooperative with normal attention span and concentration.  Behavior appropriate. No anxious or depressed appearing.      Assessment & Plan:    Assessment Prediabetes Hyperlipidemia Hypertension Hypothyroidism Carotid artery disease [place] 11-2014: 60-79% RICA stenosis. 82-95% LICA stenosis [place] 03/2129: 1-39 % B. F/u prn DJD, severe at the neck GERD, dysphagia remotely Osteopenia: T score -1.7 (01-2015) on ca- vitamin D Lymphoma,  Neck, 1970s Syncope, admitted-2014, workup negative Shingles:  10/02/2016, left arm  PLAN:  Hyperlipidemia: On Lipitor, last FLP satisfactory HTN: Controlled on HCTZ, last BMP satisfactory Hypothyroidism: Since the last visit we adjusted her dose, last TSH very good Lower extremity edema: Likely dependent, discussed low salt and leg elevation. Will call if no better. Carotid disease: Last u/s showed significant improvement Addendum: d/w cards via message, they acknowledge some variability from one technician to  another. Recheck in one year, CT angiogram if blockage ever > 80%. Primary care: Flu shot today RTC 6 months

## 2016-07-07 NOTE — Patient Instructions (Signed)
GO TO THE FRONT DESK Schedule your next appointment for a  physical exam in 6 months, fasting.      Low-Sodium Eating Plan Sodium raises blood pressure and causes water to be held in the body. Getting less sodium from food will help lower your blood pressure, reduce any swelling, and protect your heart, liver, and kidneys. We get sodium by adding salt (sodium chloride) to food. Most of our sodium comes from canned, boxed, and frozen foods. Restaurant foods, fast foods, and pizza are also very high in sodium. Even if you take medicine to lower your blood pressure or to reduce fluid in your body, getting less sodium from your food is important. WHAT IS MY PLAN? Most people should limit their sodium intake to 2,300 mg a day. Your health care provider recommends that you limit your sodium intake to __________ a day.  WHAT DO I NEED TO KNOW ABOUT THIS EATING PLAN? For the low-sodium eating plan, you will follow these general guidelines:  Choose foods with a % Daily Value for sodium of less than 5% (as listed on the food label).   Use salt-free seasonings or herbs instead of table salt or sea salt.   Check with your health care provider or pharmacist before using salt substitutes.   Eat fresh foods.  Eat more vegetables and fruits.  Limit canned vegetables. If you do use them, rinse them well to decrease the sodium.   Limit cheese to 1 oz (28 g) per day.   Eat lower-sodium products, often labeled as "lower sodium" or "no salt added."  Avoid foods that contain monosodium glutamate (MSG). MSG is sometimes added to [place] food and some canned foods.  Check food labels (Nutrition Facts labels) on foods to learn how much sodium is in one serving.  Eat more home-cooked food and less restaurant, buffet, and fast food.  When eating at a restaurant, ask that your food be prepared with less salt, or no salt if possible.  HOW DO I READ FOOD LABELS FOR SODIUM INFORMATION? The  Nutrition Facts label lists the amount of sodium in one serving of the food. If you eat more than one serving, you must multiply the listed amount of sodium by the number of servings. Food labels may also identify foods as:  Sodium free--Less than 5 mg in a serving.  Very low sodium--35 mg or less in a serving.  Low sodium--140 mg or less in a serving.  Light in sodium--50% less sodium in a serving. For example, if a food that usually has 300 mg of sodium is changed to become light in sodium, it will have 150 mg of sodium.  Reduced sodium--25% less sodium in a serving. For example, if a food that usually has 400 mg of sodium is changed to reduced sodium, it will have 300 mg of sodium. WHAT FOODS CAN I EAT? Grains Low-sodium cereals, including oats, puffed wheat and rice, and shredded wheat cereals. Low-sodium crackers. Unsalted rice and pasta. Lower-sodium bread.  Vegetables Frozen or fresh vegetables. Low-sodium or reduced-sodium canned vegetables. Low-sodium or reduced-sodium tomato sauce and paste. Low-sodium or reduced-sodium tomato and vegetable juices.  Fruits Fresh, frozen, and canned fruit. Fruit juice.  Meat and Other Protein Products Low-sodium canned tuna and salmon. Fresh or frozen meat, poultry, seafood, and fish. Lamb. Unsalted nuts. Dried beans, peas, and lentils without added salt. Unsalted canned beans. Homemade soups without salt. Eggs.  Dairy Milk. Soy milk. Ricotta cheese. Low-sodium or reduced-sodium cheeses. Yogurt.  Condiments  Fresh and dried herbs and spices. Salt-free seasonings. Onion and garlic powders. Low-sodium varieties of mustard and ketchup. Fresh or refrigerated horseradish. Lemon juice.  Fats and Oils Reduced-sodium salad dressings. Unsalted butter.  Other Unsalted popcorn and pretzels.  The items listed above may not be a complete list of recommended foods or beverages. Contact your dietitian for more options. WHAT FOODS ARE NOT  RECOMMENDED? Grains Instant hot cereals. Bread stuffing, pancake, and biscuit mixes. Croutons. Seasoned rice or pasta mixes. Noodle soup cups. Boxed or frozen macaroni and cheese. Self-rising flour. Regular salted crackers. Vegetables Regular canned vegetables. Regular canned tomato sauce and paste. Regular tomato and vegetable juices. Frozen vegetables in sauces. Salted [place] fries. Olives. Angie Fava. Relishes. Sauerkraut. Salsa. Meat and Other Protein Products Salted, canned, smoked, spiced, or pickled meats, seafood, or fish. Bacon, ham, sausage, hot dogs, corned beef, chipped beef, and packaged luncheon meats. Salt pork. Jerky. Pickled herring. Anchovies, regular canned tuna, and sardines. Salted nuts. Dairy Processed cheese and cheese spreads. Cheese curds. Blue cheese and cottage cheese. Buttermilk.  Condiments Onion and garlic salt, seasoned salt, table salt, and sea salt. Canned and packaged gravies. Worcestershire sauce. Tartar sauce. Barbecue sauce. Teriyaki sauce. Soy sauce, including reduced sodium. Steak sauce. Fish sauce. Oyster sauce. Cocktail sauce. Horseradish that you find on the shelf. Regular ketchup and mustard. Meat flavorings and tenderizers. Bouillon cubes. Hot sauce. Tabasco sauce. Marinades. Taco seasonings. Relishes. Fats and Oils Regular salad dressings. Salted butter. Margarine. Ghee. Bacon fat.  Other Potato and tortilla chips. Corn chips and puffs. Salted popcorn and pretzels. Canned or dried soups. Pizza. Frozen entrees and pot pies.  The items listed above may not be a complete list of foods and beverages to avoid. Contact your dietitian for more information.   This information is not intended to replace advice given to you by your health care provider. Make sure you discuss any questions you have with your health care provider.   Document Released: 04/04/2002 Document Revised: 11/03/2014 Document Reviewed: 08/17/2013 Elsevier Interactive Patient Education  [employer].

## 2016-07-08 NOTE — Assessment & Plan Note (Addendum)
   Hyperlipidemia: On Lipitor, last FLP satisfactory HTN: Controlled on HCTZ, last BMP satisfactory Hypothyroidism: Since the last visit we adjusted her dose, last TSH very good Lower extremity edema: Likely dependent, discussed low salt and leg elevation. Will call if no better. Carotid disease: Last u/s showed significant improvement Addendum: d/w cards via message, they acknowledge some variability from one technician to  another. Recheck in one year, CT angiogram if blockage ever > 80%. Primary care: Flu shot today RTC 6 months

## 2016-08-25 DIAGNOSIS — H02831 Dermatochalasis of right upper eyelid: Secondary | ICD-10-CM | POA: Diagnosis not present

## 2016-08-25 DIAGNOSIS — H02834 Dermatochalasis of left upper eyelid: Secondary | ICD-10-CM | POA: Diagnosis not present

## 2016-09-29 ENCOUNTER — Other Ambulatory Visit: Payer: Self-pay | Admitting: Internal Medicine

## 2016-10-01 DIAGNOSIS — Z1231 Encounter for screening mammogram for malignant neoplasm of breast: Secondary | ICD-10-CM | POA: Diagnosis not present

## 2016-10-01 LAB — HM MAMMOGRAPHY

## 2016-10-10 ENCOUNTER — Encounter: Payer: Self-pay | Admitting: Internal Medicine

## 2016-11-16 ENCOUNTER — Other Ambulatory Visit: Payer: Self-pay | Admitting: Internal Medicine

## 2016-12-23 ENCOUNTER — Other Ambulatory Visit: Payer: Self-pay | Admitting: Internal Medicine

## 2017-01-13 ENCOUNTER — Encounter: Payer: Self-pay | Admitting: Internal Medicine

## 2017-01-13 ENCOUNTER — Ambulatory Visit (INDEPENDENT_AMBULATORY_CARE_PROVIDER_SITE_OTHER): Payer: PPO | Admitting: Internal Medicine

## 2017-01-13 VITALS — BP 126/70 | HR 79 | Temp 98.2°F | Resp 14 | Ht 65.0 in | Wt 169.4 lb

## 2017-01-13 DIAGNOSIS — E785 Hyperlipidemia, unspecified: Secondary | ICD-10-CM

## 2017-01-13 DIAGNOSIS — I10 Essential (primary) hypertension: Secondary | ICD-10-CM

## 2017-01-13 DIAGNOSIS — R739 Hyperglycemia, unspecified: Secondary | ICD-10-CM

## 2017-01-13 DIAGNOSIS — Z0000 Encounter for general adult medical examination without abnormal findings: Secondary | ICD-10-CM

## 2017-01-13 DIAGNOSIS — E039 Hypothyroidism, unspecified: Secondary | ICD-10-CM

## 2017-01-13 DIAGNOSIS — R079 Chest pain, unspecified: Secondary | ICD-10-CM

## 2017-01-13 LAB — CBC WITH DIFFERENTIAL/PLATELET
Basophils Absolute: 0 10*3/uL (ref 0.0–0.1)
Basophils Relative: 0.6 % (ref 0.0–3.0)
Eosinophils Absolute: 0.2 10*3/uL (ref 0.0–0.7)
Eosinophils Relative: 3.6 % (ref 0.0–5.0)
HCT: 40.9 % (ref 36.0–46.0)
Hemoglobin: 13.8 g/dL (ref 12.0–15.0)
Lymphocytes Relative: 22 % (ref 12.0–46.0)
Lymphs Abs: 1.4 10*3/uL (ref 0.7–4.0)
MCHC: 33.7 g/dL (ref 30.0–36.0)
MCV: 86.5 fl (ref 78.0–100.0)
Monocytes Absolute: 0.5 10*3/uL (ref 0.1–1.0)
Monocytes Relative: 7.8 % (ref 3.0–12.0)
Neutro Abs: 4.2 10*3/uL (ref 1.4–7.7)
Neutrophils Relative %: 66 % (ref 43.0–77.0)
Platelets: 217 10*3/uL (ref 150.0–400.0)
RBC: 4.72 Mil/uL (ref 3.87–5.11)
RDW: 14.5 % (ref 11.5–15.5)
WBC: 6.4 10*3/uL (ref 4.0–10.5)

## 2017-01-13 LAB — LIPID PANEL
Cholesterol: 148 mg/dL (ref 0–200)
HDL: 52.4 mg/dL (ref >39.00)
LDL Cholesterol: 67 mg/dL (ref 0–99)
NonHDL: 95.88
Total CHOL/HDL Ratio: 3
Triglycerides: 143 mg/dL (ref 0.0–149.0)
VLDL: 28.6 mg/dL (ref 0.0–40.0)

## 2017-01-13 LAB — BASIC METABOLIC PANEL
BUN: 16 mg/dL (ref 6–23)
CO2: 32 mEq/L (ref 19–32)
Calcium: 10 mg/dL (ref 8.4–10.5)
Chloride: 102 mEq/L (ref 96–112)
Creatinine, Ser: 0.88 mg/dL (ref 0.40–1.20)
GFR: 65.53 mL/min (ref >60.00)
Glucose, Bld: 108 mg/dL — ABNORMAL HIGH (ref 70–99)
Potassium: 4.4 mEq/L (ref 3.5–5.1)
Sodium: 139 mEq/L (ref 135–145)

## 2017-01-13 LAB — ALT: ALT: 20 U/L (ref 0–35)

## 2017-01-13 LAB — TSH: TSH: 0.51 u[IU]/mL (ref 0.35–4.50)

## 2017-01-13 LAB — AST: AST: 19 U/L (ref 0–37)

## 2017-01-13 LAB — HEMOGLOBIN A1C: Hgb A1c MFr Bld: 6.1 % (ref 4.6–6.5)

## 2017-01-13 NOTE — Assessment & Plan Note (Addendum)
--  Td 11-09; Pneumina shot 2008; prevnar 2015; shingles shot 02-2013  --[facility]--10/2012--Dr Brodie--benign polyp--no recall needed  --No further PAPs, see entry from 2017. --02-2011, vulvar biopsy was negative ,uses clobetasol prn , if rash is persistent-changes --> to see gyn again. Reports no problems today --MMG--09-2016 Benign   Cont healthy life style

## 2017-01-13 NOTE — Assessment & Plan Note (Signed)
Prediabetes: Diet control, check A1c Hyperlipidemia: on Lipitor, check a FLP, AST, ALT. HTN: On Maxide, check a BMP and CBC Hypothyroidism: cont Synthroid, check a TSH GERD, dysphagia: Sxs are stable over time. Rec   small bites, eat with fluids. Choking risk discussed, GI referral? Pt not ready but will call me if she ever thinks she likes to proceed. Chest pain: Atypical, associated with food intake, EKG today sinus rythm, no acute changes. Rx obs . Call if sx increase. RTC 6 months

## 2017-01-13 NOTE — Progress Notes (Signed)
Subjective:    Patient ID: Angela Clay, female    DOB: 25-Jun-1934, 83 y.o.   MRN: [id number]  DOS:  01/13/2017 Type of visit - description : cpx Interval history: We also reviewed her chronic ankle issues. She lost a g-child few months ago, she had a hard time but is doing okay emotionally at this point.   Review of Systems About a year history of on and off chest tightness, center of the chest, some radiation to the left, episodes are random, every 2-4 weeks, usually last a minute. Not associated to nausea, SOB or diaphoresis. When asked, admits that pains sometimes is related to eating.  Also, has a long history of dysphagia, occasionally has to regurgitate solid food but after she does that is able to go back to finish her meal without problems. Symptoms do not appear to be progressive over time. No heartburn per se, occasionally takes PPIs. No recent EGD.   Other than above, a 14 point review of systems is negative     Past Medical History:  Diagnosis Date  . Actinic keratosis 05/2015   Right mid central dorsal hand  . GERD (gastroesophageal reflux disease)    EGD for dysphagia 2000 aprox (-)  . Hyperlipidemia   . Hypertension   . Hypothyroidism   . Lymphoma ([place])    neck,mlig,unspecified site dx 1970s  . OA (osteoarthritis)    severe at neck  . Osteopenia   . Syncope 11-2012   admited . w/u (-)    Past Surgical History:  Procedure Laterality Date  . APPENDECTOMY    . BELPHAROPTOSIS REPAIR Bilateral 2017  . CHOLECYSTECTOMY  02/24/06  . THYROIDECTOMY    . TONSILLECTOMY AND ADENOIDECTOMY  1949  . TUBAL LIGATION  1972    Social History   Social History  . Marital status: Married    Spouse name: Iona Beard  . Number of children: 3  . Years of education: N/A   Occupational History  . retired    Social History Main Topics  . Smoking status: Never Smoker  . Smokeless tobacco: Never Used  . Alcohol use No  . Drug use: No  . Sexual activity: Yes    Partners:  Male    Birth control/ protection: Post-menopausal   Other Topics Concern  . Not on file   Social History Narrative   3 kids, 8 GK   Lives w/ husband   Lost a g-child to suicide 2017           Family History  Problem Relation Age of Onset  . Stroke Mother 60  . Coronary artery disease Father     MI age 9  . Deep vein thrombosis Brother     PE at age 78  . Heart failure Sister   . Diabetes Neg Hx   . Colon cancer Neg Hx   . Breast cancer Neg Hx      Allergies as of 01/13/2017      Reactions   Codeine Nausea And Vomiting      Medication List       Accurate as of 01/13/17  6:28 PM. Always use your most recent med list.          acetaminophen 500 MG tablet Commonly known as:  TYLENOL Take 500 mg by mouth every 6 (six) hours as needed. For pain   aspirin 81 MG tablet Take 81 mg by mouth daily.   atorvastatin 40 MG tablet Commonly known as:  LIPITOR  Take 1 tablet (40 mg total) by mouth at bedtime.   calcium-vitamin D 250-125 MG-UNIT tablet Commonly known as:  OSCAL Take 1 tablet by mouth.   hydrochlorothiazide 25 MG tablet Commonly known as:  HYDRODIURIL Take 1 tablet (25 mg total) by mouth daily.   levothyroxine 112 MCG tablet Commonly known as:  SYNTHROID, LEVOTHROID Take 1 tablet (112 mcg total) by mouth daily before breakfast.   omeprazole 20 MG capsule Commonly known as:  PRILOSEC Take 1 capsule (20 mg total) by mouth daily as needed.   triamcinolone 55 MCG/ACT Aero nasal inhaler Commonly known as:  NASACORT Place 2 sprays into the nose daily as needed.          Objective:   Physical Exam BP 126/70 (BP Location: Left Arm, Patient Position: Sitting, Cuff Size: Normal)   Pulse 79   Temp 98.2 F (36.8 C) (Oral)   Resp 14   Ht 5' 5"  (1.651 m)   Wt 169 lb 6 oz (76.8 kg)   SpO2 96%   BMI 28.19 kg/m   General:   Well developed, well nourished . NAD.  Neck: No  thyromegaly . ROM is quite decreased particularly flexion extension. HEENT:    Normocephalic . Face symmetric, atraumatic Lungs:  CTA B Normal respiratory effort, no intercostal retractions, no accessory muscle use. Heart: RRR,  no murmur.  No pretibial edema bilaterally  Abdomen:  Not distended, soft, non-tender. No rebound or rigidity.   Skin: Exposed areas without rash. Not pale. Not jaundice Neurologic:  alert & oriented X3.  Speech normal, gait appropriate for age and unassisted Strength symmetric and appropriate for age.  Psych: Cognition and judgment appear intact.  Cooperative with normal attention span and concentration.  Behavior appropriate. No anxious or depressed appearing.    Assessment & Plan:    Assessment Prediabetes Hyperlipidemia HTN Hypothyroidism Carotid artery disease [place] 11-2014: 60-79% RICA stenosis. 76-19% LICA stenosis [place] 02/931: 1-39 % B. 1 year DJD, severe at the neck GERD, dysphagia remotely Osteopenia: s/p fosamax 2007 to 2012.  T score -1.7 (01-2015) on ca- vitamin D Lymphoma,  Neck, 1970s Syncope, admitted-2014, workup negative Shingles: 10/02/2016, left arm  PLAN: Prediabetes: Diet control, check A1c Hyperlipidemia: on Lipitor, check a FLP, AST, ALT. HTN: On Maxide, check a BMP and CBC Hypothyroidism: cont Synthroid, check a TSH GERD, dysphagia: Sxs are stable over time. Rec   small bites, eat with fluids. Choking risk discussed, GI referral? Pt not ready but will call me if she ever thinks she likes to proceed. Chest pain: Atypical, associated with food intake, EKG today sinus rythm, no acute changes. Rx obs . Call if sx increase. RTC 6 months

## 2017-01-13 NOTE — Progress Notes (Signed)
Pre visit review using our clinic review tool, if applicable. No additional management support is needed unless otherwise documented below in the visit note. 

## 2017-01-13 NOTE — Patient Instructions (Signed)
GO TO THE LAB : Get the blood work     GO TO THE FRONT DESK Schedule your next appointment for a  checkup in 6 months  Schedule Medicare wellness exam with one of our RNs

## 2017-01-14 ENCOUNTER — Telehealth: Payer: Self-pay | Admitting: Internal Medicine

## 2017-01-14 NOTE — Telephone Encounter (Signed)
Left pt message asking to call Ebony Hail back directly at [phone] to schedule AWV. Thanks!

## 2017-01-26 ENCOUNTER — Other Ambulatory Visit: Payer: Self-pay | Admitting: Internal Medicine

## 2017-02-28 NOTE — Telephone Encounter (Signed)
Scheduled 05/11/17

## 2017-03-03 ENCOUNTER — Encounter: Payer: Self-pay | Admitting: Internal Medicine

## 2017-03-03 ENCOUNTER — Ambulatory Visit (INDEPENDENT_AMBULATORY_CARE_PROVIDER_SITE_OTHER): Payer: PPO | Admitting: Internal Medicine

## 2017-03-03 VITALS — BP 126/74 | HR 84 | Temp 98.4°F | Resp 14 | Ht 65.0 in | Wt 170.0 lb

## 2017-03-03 DIAGNOSIS — L03119 Cellulitis of unspecified part of limb: Secondary | ICD-10-CM | POA: Diagnosis not present

## 2017-03-03 MED ORDER — CEPHALEXIN 500 MG PO CAPS: 500.0000 mg | ORAL_CAPSULE | Freq: Four times a day (QID) | ORAL | 0 refills | Status: DC

## 2017-03-03 NOTE — Progress Notes (Signed)
Subjective:    Patient ID: Angela Clay, female    DOB: December 05, 1933, 83 y.o.   MRN: [id number]  DOS:  03/03/2017 Type of visit - description : Acute Interval history: About 3 days ago, noticed redness, swelling, pain and the second knuckle, left hand. Overall, symptoms are decreasing.   Review of Systems No fever, question of chills. No injury.  Past Medical History:  Diagnosis Date  . Actinic keratosis 05/2015   Right mid central dorsal hand  . GERD (gastroesophageal reflux disease)    EGD for dysphagia 2000 aprox (-)  . Hyperlipidemia   . Hypertension   . Hypothyroidism   . Lymphoma ([place])    neck,mlig,unspecified site dx 1970s  . OA (osteoarthritis)    severe at neck  . Osteopenia   . Syncope 11-2012   admited . w/u (-)    Past Surgical History:  Procedure Laterality Date  . APPENDECTOMY    . BELPHAROPTOSIS REPAIR Bilateral 2017  . CHOLECYSTECTOMY  02/24/06  . THYROIDECTOMY    . TONSILLECTOMY AND ADENOIDECTOMY  1949  . TUBAL LIGATION  1972    Social History   Social History  . Marital status: Married    Spouse name: Iona Beard  . Number of children: 3  . Years of education: N/A   Occupational History  . retired    Social History Main Topics  . Smoking status: Never Smoker  . Smokeless tobacco: Never Used  . Alcohol use No  . Drug use: No  . Sexual activity: Yes    Partners: Male    Birth control/ protection: Post-menopausal   Other Topics Concern  . Not on file   Social History Narrative   3 kids, 8 GK   Lives w/ husband   Lost a g-child to suicide 2017            Allergies as of 03/03/2017      Reactions   Codeine Nausea And Vomiting      Medication List       Accurate as of 03/03/17 11:59 PM. Always use your most recent med list.          acetaminophen 500 MG tablet Commonly known as:  TYLENOL Take 500 mg by mouth every 6 (six) hours as needed. For pain   aspirin 81 MG tablet Take 81 mg by mouth daily.   atorvastatin 40 MG  tablet Commonly known as:  LIPITOR Take 1 tablet (40 mg total) by mouth at bedtime.   calcium-vitamin D 250-125 MG-UNIT tablet Commonly known as:  OSCAL Take 1 tablet by mouth.   cephALEXin 500 MG capsule Commonly known as:  KEFLEX Take 1 capsule (500 mg total) by mouth 4 (four) times daily.   hydrochlorothiazide 25 MG tablet Commonly known as:  HYDRODIURIL Take 1 tablet (25 mg total) by mouth daily.   levothyroxine 112 MCG tablet Commonly known as:  SYNTHROID, LEVOTHROID Take 1 tablet (112 mcg total) by mouth daily before breakfast.   omeprazole 20 MG capsule Commonly known as:  PRILOSEC Take 1 capsule (20 mg total) by mouth daily as needed.   triamcinolone 55 MCG/ACT Aero nasal inhaler Commonly known as:  NASACORT Place 2 sprays into the nose daily as needed.          Objective:   Physical Exam BP 126/74 (BP Location: Left Arm, Patient Position: Sitting, Cuff Size: Normal)   Pulse 84   Temp 98.4 F (36.9 C) (Oral)   Resp 14   Ht 5'  5" (1.651 m)   Wt 170 lb (77.1 kg)   SpO2 96%   BMI 28.29 kg/m  General:   Well developed, well nourished . NAD.  HEENT:  Normocephalic . Face symmetric, atraumatic  MSK: Hands and wrists normal to inspection - palpation except for the second left knuckle. Slightly red, minimal warmness to touch, range of motion is normal. Palpation of the knuckle itself is not particularly tender. See picture Skin: Not pale. Not jaundice Neurologic:  alert & oriented X3.  Speech normal, gait appropriate for age and unassisted Psych--  Cognition and judgment appear intact.  Cooperative with normal attention span and concentration.  Behavior appropriate. No anxious or depressed appearing.            Assessment & Plan:   Assessment Prediabetes Hyperlipidemia HTN Hypothyroidism Carotid artery disease [place] 11-2014: 60-79% RICA stenosis. 82-86% LICA stenosis [place] 04/5197: 1-39 % B. 1 year DJD, severe at the neck GERD, dysphagia  remotely Osteopenia: s/p fosamax 2007 to 2012.  T score -1.7 (01-2015) on ca- vitamin D Lymphoma,  Neck, 1970s Syncope, admitted-2014, workup negative Shingles: 10/02/2016, left arm  PLAN: Cellulitis: Redness at the knuckle as described above, DDX includes gout, cellulitis and less likely a septic joint. The fact that getting better without intervention speak against a septic joint. Plan: Keflex for presumed cellulitis, if she is not better or she has other episodes she is to let me know. Gout?Marland Kitchen

## 2017-03-03 NOTE — Patient Instructions (Signed)
Take keflex for 5 days  Tylenol as needed  Call if not gradually better  Call if worse  Call if you have another episode

## 2017-03-03 NOTE — Progress Notes (Signed)
Pre visit review using our clinic review tool, if applicable. No additional management support is needed unless otherwise documented below in the visit note. 

## 2017-03-04 NOTE — Assessment & Plan Note (Signed)
Cellulitis: Redness at the knuckle as described above, DDX includes gout, cellulitis and less likely a septic joint. The fact that getting better without intervention speak against a septic joint. Plan: Keflex for presumed cellulitis, if she is not better or she has other episodes she is to let me know. Gout?Marland Kitchen

## 2017-03-26 ENCOUNTER — Other Ambulatory Visit: Payer: Self-pay | Admitting: Internal Medicine

## 2017-03-30 DIAGNOSIS — L720 Epidermal cyst: Secondary | ICD-10-CM | POA: Diagnosis not present

## 2017-03-30 DIAGNOSIS — L821 Other seborrheic keratosis: Secondary | ICD-10-CM | POA: Diagnosis not present

## 2017-03-30 DIAGNOSIS — L80 Vitiligo: Secondary | ICD-10-CM | POA: Diagnosis not present

## 2017-03-30 DIAGNOSIS — D1801 Hemangioma of skin and subcutaneous tissue: Secondary | ICD-10-CM | POA: Diagnosis not present

## 2017-03-30 DIAGNOSIS — Z85828 Personal history of other malignant neoplasm of skin: Secondary | ICD-10-CM | POA: Diagnosis not present

## 2017-03-30 DIAGNOSIS — L309 Dermatitis, unspecified: Secondary | ICD-10-CM | POA: Diagnosis not present

## 2017-03-30 DIAGNOSIS — L819 Disorder of pigmentation, unspecified: Secondary | ICD-10-CM | POA: Diagnosis not present

## 2017-04-17 DIAGNOSIS — H524 Presbyopia: Secondary | ICD-10-CM | POA: Diagnosis not present

## 2017-04-17 DIAGNOSIS — H5203 Hypermetropia, bilateral: Secondary | ICD-10-CM | POA: Diagnosis not present

## 2017-04-17 DIAGNOSIS — H2513 Age-related nuclear cataract, bilateral: Secondary | ICD-10-CM | POA: Diagnosis not present

## 2017-05-07 NOTE — Progress Notes (Addendum)
Subjective:   Angela Clay is a 83 y.o. female who presents for Medicare Annual (Subsequent) preventive examination.  Review of Systems:  No ROS.  Medicare Wellness Visit. Additional risk factors are reflected in the social history.  Cardiac Risk Factors include: advanced age (>64mn, >>29women);sedentary lifestyle;dyslipidemia;hypertension Sleep patterns:  Sleeps about 6 hrs. Naps occasionally.  Home Safety/Smoke Alarms: Feels safe in home. Smoke alarms in place.  Living environment; residence and Firearm Safety: Lives with husband. No guns. No stairs. Seat Belt Safety/Bike Helmet: Wears seat belt.   Counseling:   Eye Exam- Wears glasses. Dr.Lyles yearly. Dental- Dr.Shelor every yearly  Female:   Pap- No longer doing routine screening due to age. Referral placed today based on pt request due to family hx      Mammo-  Last 10/01/16:   BI-RADS Category 2-Benign   Dexa scan- Last 02/01/15: osteopenia. ORDERED TODAY.        CCS- Last 11/26/12: non-pre-cancerous polyps removed. No recall due to age.     Objective:     Vitals: BP 122/62 (BP Location: Left Arm, Patient Position: Sitting, Cuff Size: Normal)   Pulse 74   Ht 5' 5"  (1.651 m)   Wt 168 lb 6.4 oz (76.4 kg)   SpO2 98%   BMI 28.02 kg/m   Body mass index is 28.02 kg/m.   Tobacco History  Smoking Status  . Never Smoker  Smokeless Tobacco  . Never Used     Counseling given: Not Answered   Past Medical History:  Diagnosis Date  . Actinic keratosis 05/2015   Right mid central dorsal hand  . GERD (gastroesophageal reflux disease)    EGD for dysphagia 2000 aprox (-)  . Hyperlipidemia   . Hypertension   . Hypothyroidism   . [place],unspecified site dx 1970s  . OA (osteoarthritis)    severe at neck  . Osteopenia   . Syncope 11-2012   admited . w/u (-)   Past Surgical History:  Procedure Laterality Date  . APPENDECTOMY    . BELPHAROPTOSIS REPAIR Bilateral 2017  . CHOLECYSTECTOMY  02/24/06    . THYROIDECTOMY    . TONSILLECTOMY AND ADENOIDECTOMY  1949  . TUBAL LIGATION  1972   Family History  Problem Relation Age of Onset  . Stroke Mother 860 . Coronary artery disease Father        MI age 81 . Deep vein thrombosis Brother        PE at age 81 . Heart failure Sister   . Diabetes Neg Hx   . Colon cancer Neg Hx   . Breast cancer Neg Hx    History  Sexual Activity  . Sexual activity: No    Outpatient Encounter Prescriptions as of 05/11/2017  Medication Sig  . acetaminophen (TYLENOL) 500 MG tablet Take 500 mg by mouth every 6 (six) hours as needed. For pain  . aspirin 81 MG tablet Take 81 mg by mouth daily.    .Marland Kitchenatorvastatin (LIPITOR) 40 MG tablet Take 1 tablet (40 mg total) by mouth at bedtime.  . calcium-vitamin D (OSCAL) 250-125 MG-UNIT per tablet Take 1 tablet by mouth.   . hydrochlorothiazide (HYDRODIURIL) 25 MG tablet Take 1 tablet (25 mg total) by mouth daily.  .Marland Kitchenlevothyroxine (SYNTHROID, LEVOTHROID) 112 MCG tablet Take 1 tablet (112 mcg total) by mouth daily before breakfast.  . omeprazole (PRILOSEC) 20 MG capsule Take 1 capsule (20 mg total) by mouth daily as  needed.  . triamcinolone (NASACORT) 55 MCG/ACT AERO nasal inhaler Place 2 sprays into the nose daily as needed.  . [DISCONTINUED] cephALEXin (KEFLEX) 500 MG capsule Take 1 capsule (500 mg total) by mouth 4 (four) times daily.  . [DISCONTINUED] hydrochlorothiazide (HYDRODIURIL) 25 MG tablet Take 1 tablet (25 mg total) by mouth daily.   No facility-administered encounter medications on file as of 05/11/2017.     Activities of Daily Living In your present state of health, do you have any difficulty performing the following activities: 05/11/2017 07/07/2016  Hearing? N N  Vision? N N  Difficulty concentrating or making decisions? N N  Walking or climbing stairs? N N  Dressing or bathing? N N  Doing errands, shopping? N N  Preparing Food and eating ? N -  Using the Toilet? N -  In the past six months, have  you accidently leaked urine? Y -  Do you have problems with loss of bowel control? N -  Managing your Medications? N -  Managing your Finances? N -  Housekeeping or managing your Housekeeping? N -  Some recent data might be hidden    Patient Care Team: Colon Branch, MD as PCP - General Jerrell Belfast, MD as Consulting Physician (Otolaryngology) [name], Amy, MD as Consulting Physician (Dermatology)    Assessment:    Physical assessment defeegg rred to PCP.  Exercise Activities and Dietary recommendations Current Exercise Habits: The patient does not participate in regular exercise at present, Exercise limited by: None identified   Diet (meal preparation, eat out, water intake, caffeinated beverages, dairy products, fruits and vegetables): in general, a "healthy" diet         Goals      Patient Stated   . Maintain healthy lifestyle (pt-stated)      Fall Risk Fall Risk  05/11/2017 07/07/2016 01/10/2016 07/03/2015 01/24/2015  Falls in the past year? No No No No No   Depression Screen PHQ 2/9 Scores 05/11/2017 07/07/2016 01/10/2016 07/03/2015  PHQ - 2 Score 0 0 0 0     Cognitive Function MMSE - Mini Mental State Exam 05/11/2017  Orientation to time 5  Orientation to Place 5  Registration 3  Attention/ Calculation 5  Recall 2  Language- name 2 objects 2  Language- repeat 1  Language- follow 3 step command 2  Language- read & follow direction 1  Write a sentence 1  Copy design 1  Total score 28        Immunization History  Administered Date(s) Administered  . Influenza Split 10/01/2011  . Influenza Whole 08/25/2007, 09/08/2008, 08/03/2009, 08/20/2010  . Influenza, High Dose Seasonal PF 07/03/2015, 07/07/2016  . Influenza, Seasonal, Injecte, Preservative Fre 10/11/2012  . Influenza,inj,Quad PF,36+ Mos 10/18/2013, 09/19/2014  . Pneumococcal Conjugate-13 01/02/2014  . Pneumococcal Polysaccharide-23 10/27/2000, 08/25/2007  . Td 10/27/1997, 08/30/2008  . Zoster 03/04/2013     Screening Tests Health Maintenance  Topic Date Due  . INFLUENZA VACCINE  05/27/2017  . MAMMOGRAM  10/01/2017  . TETANUS/TDAP  08/30/2018  . DEXA SCAN  Completed  . PNA vac Low Risk Adult  Completed      Plan:   Follow up with Dr.Paz as scheduled 07/16/17.  Continue to eat heart healthy diet (full of fruits, vegetables, whole grains, lean protein, water--limit salt, fat, and sugar intake) and increase physical activity as tolerated.  Continue doing brain stimulating activities (puzzles, reading, adult coloring books, staying active) to keep memory sharp.   Bone density scan ordered today and gynecology  referral has been placed. They will both call you to schedule appointment.  I have personally reviewed and noted the following in the patient's chart:   . Medical and social history . Use of alcohol, tobacco or illicit drugs  . Current medications and supplements . Functional ability and status . Nutritional status . Physical activity . Advanced directives . List of other physicians . Hospitalizations, surgeries, and ER visits in previous 12 months . Vitals . Screenings to include cognitive, depression, and falls . Referrals and appointments  In addition, I have reviewed and discussed with patient certain preventive protocols, quality metrics, and best practice recommendations. A written personalized care plan for preventive services as well as general preventive health recommendations were provided to patient.     Naaman Plummer [place], [place]  05/11/2017   [name], MD

## 2017-05-11 ENCOUNTER — Encounter: Payer: Self-pay | Admitting: *Deleted

## 2017-05-11 ENCOUNTER — Ambulatory Visit (INDEPENDENT_AMBULATORY_CARE_PROVIDER_SITE_OTHER): Payer: PPO | Admitting: *Deleted

## 2017-05-11 ENCOUNTER — Other Ambulatory Visit: Payer: Self-pay | Admitting: Internal Medicine

## 2017-05-11 VITALS — BP 122/62 | HR 74 | Ht 65.0 in | Wt 168.4 lb

## 2017-05-11 DIAGNOSIS — Z780 Asymptomatic menopausal state: Secondary | ICD-10-CM

## 2017-05-11 DIAGNOSIS — Z0000 Encounter for general adult medical examination without abnormal findings: Secondary | ICD-10-CM

## 2017-05-11 DIAGNOSIS — Z8041 Family history of malignant neoplasm of ovary: Secondary | ICD-10-CM

## 2017-05-11 NOTE — Patient Instructions (Signed)
Angela Clay , Thank you for taking time to come for your Medicare Wellness Visit. I appreciate your ongoing commitment to your health goals. Please review the following plan we discussed and let me know if I can assist you in the future.   These are the goals we discussed: Goals      Patient Stated   . Maintain healthy lifestyle (pt-stated)       This is a list of the screening recommended for you and due dates:  Health Maintenance  Topic Date Due  . Flu Shot  05/27/2017  . Mammogram  10/01/2017  . Tetanus Vaccine  08/30/2018  . DEXA scan (bone density measurement)  Completed  . Pneumonia vaccines  Completed   Follow up with Dr.Paz as scheduled 07/16/17.  Continue to eat heart healthy diet (full of fruits, vegetables, whole grains, lean protein, water--limit salt, fat, and sugar intake) and increase physical activity as tolerated.  Continue doing brain stimulating activities (puzzles, reading, adult coloring books, staying active) to keep memory sharp.   Bone density scan ordered today and gynecology referral has been placed. They will both call you to schedule appointment.   Health Maintenance for Postmenopausal Women Menopause is a normal process in which your reproductive ability comes to an end. This process happens gradually over a span of months to years, usually between the ages of 60 and 85. Menopause is complete when you have missed 12 consecutive menstrual periods. It is important to talk with your health care provider about some of the most common conditions that affect postmenopausal women, such as heart disease, cancer, and bone loss (osteoporosis). Adopting a healthy lifestyle and getting preventive care can help to promote your health and wellness. Those actions can also lower your chances of developing some of these common conditions. What should I know about menopause? During menopause, you may experience a number of symptoms, such as:  Moderate-to-severe hot  flashes.  Night sweats.  Decrease in sex drive.  Mood swings.  Headaches.  Tiredness.  Irritability.  Memory problems.  Insomnia.  Choosing to treat or not to treat menopausal changes is an individual decision that you make with your health care provider. What should I know about hormone replacement therapy and supplements? Hormone therapy products are effective for treating symptoms that are associated with menopause, such as hot flashes and night sweats. Hormone replacement carries certain risks, especially as you become older. If you are thinking about using estrogen or estrogen with progestin treatments, discuss the benefits and risks with your health care provider. What should I know about heart disease and stroke? Heart disease, heart attack, and stroke become more likely as you age. This may be due, in part, to the hormonal changes that your body experiences during menopause. These can affect how your body processes dietary fats, triglycerides, and cholesterol. Heart attack and stroke are both medical emergencies. There are many things that you can do to help prevent heart disease and stroke:  Have your blood pressure checked at least every 1-2 years. High blood pressure causes heart disease and increases the risk of stroke.  If you are 74-67 years old, ask your health care provider if you should take aspirin to prevent a heart attack or a stroke.  Do not use any tobacco products, including cigarettes, chewing tobacco, or electronic cigarettes. If you need help quitting, ask your health care provider.  It is important to eat a healthy diet and maintain a healthy weight. ? Be sure to include  plenty of vegetables, fruits, low-fat dairy products, and lean protein. ? Avoid eating foods that are high in solid fats, added sugars, or salt (sodium).  Get regular exercise. This is one of the most important things that you can do for your health. ? Try to exercise for at least 150  minutes each week. The type of exercise that you do should increase your heart rate and make you sweat. This is known as moderate-intensity exercise. ? Try to do strengthening exercises at least twice each week. Do these in addition to the moderate-intensity exercise.  Know your numbers.Ask your health care provider to check your cholesterol and your blood glucose. Continue to have your blood tested as directed by your health care provider.  What should I know about cancer screening? There are several types of cancer. Take the following steps to reduce your risk and to catch any cancer development as early as possible. Breast Cancer  Practice breast self-awareness. ? This means understanding how your breasts normally appear and feel. ? It also means doing regular breast self-exams. Let your health care provider know about any changes, no matter how small.  If you are 58 or older, have a clinician do a breast exam (clinical breast exam or CBE) every year. Depending on your age, family history, and medical history, it may be recommended that you also have a yearly breast X-ray (mammogram).  If you have a family history of breast cancer, talk with your health care provider about genetic screening.  If you are at high risk for breast cancer, talk with your health care provider about having an MRI and a mammogram every year.  Breast cancer (BRCA) gene test is recommended for women who have family members with BRCA-related cancers. Results of the assessment will determine the need for genetic counseling and BRCA1 and for BRCA2 testing. BRCA-related cancers include these types: ? Breast. This occurs in males or females. ? Ovarian. ? Tubal. This may also be called fallopian tube cancer. ? Cancer of the abdominal or pelvic lining (peritoneal cancer). ? Prostate. ? Pancreatic.  Cervical, Uterine, and Ovarian Cancer Your health care provider may recommend that you be screened regularly for cancer  of the pelvic organs. These include your ovaries, uterus, and vagina. This screening involves a pelvic exam, which includes checking for microscopic changes to the surface of your cervix (Pap test).  For women ages 21-65, health care providers may recommend a pelvic exam and a Pap test every three years. For women ages 2-65, they may recommend the Pap test and pelvic exam, combined with testing for human papilloma virus (HPV), every five years. Some types of HPV increase your risk of cervical cancer. Testing for HPV may also be done on women of any age who have unclear Pap test results.  Other health care providers may not recommend any screening for nonpregnant women who are considered low risk for pelvic cancer and have no symptoms. Ask your health care provider if a screening pelvic exam is right for you.  If you have had past treatment for cervical cancer or a condition that could lead to cancer, you need Pap tests and screening for cancer for at least 20 years after your treatment. If Pap tests have been discontinued for you, your risk factors (such as having a new sexual partner) need to be reassessed to determine if you should start having screenings again. Some women have medical problems that increase the chance of getting cervical cancer. In these cases, your  health care provider may recommend that you have screening and Pap tests more often.  If you have a family history of uterine cancer or ovarian cancer, talk with your health care provider about genetic screening.  If you have vaginal bleeding after reaching menopause, tell your health care provider.  There are currently no reliable tests available to screen for ovarian cancer.  Lung Cancer Lung cancer screening is recommended for adults 49-33 years old who are at high risk for lung cancer because of a history of smoking. A yearly low-dose CT scan of the lungs is recommended if you:  Currently smoke.  Have a history of at least 30  pack-years of smoking and you currently smoke or have quit within the past 15 years. A pack-year is smoking an average of one pack of cigarettes per day for one year.  Yearly screening should:  Continue until it has been 15 years since you quit.  Stop if you develop a health problem that would prevent you from having lung cancer treatment.  Colorectal Cancer  This type of cancer can be detected and can often be prevented.  Routine colorectal cancer screening usually begins at age 76 and continues through age 79.  If you have risk factors for colon cancer, your health care provider may recommend that you be screened at an earlier age.  If you have a family history of colorectal cancer, talk with your health care provider about genetic screening.  Your health care provider may also recommend using home test kits to check for hidden blood in your stool.  A small camera at the end of a tube can be used to examine your colon directly (sigmoidoscopy or colonoscopy). This is done to check for the earliest forms of colorectal cancer.  Direct examination of the colon should be repeated every 5-10 years until age 75. However, if early forms of precancerous polyps or small growths are found or if you have a family history or genetic risk for colorectal cancer, you may need to be screened more often.  Skin Cancer  Check your skin from head to toe regularly.  Monitor any moles. Be sure to tell your health care provider: ? About any new moles or changes in moles, especially if there is a change in a mole's shape or color. ? If you have a mole that is larger than the size of a pencil eraser.  If any of your family members has a history of skin cancer, especially at a young age, talk with your health care provider about genetic screening.  Always use sunscreen. Apply sunscreen liberally and repeatedly throughout the day.  Whenever you are outside, protect yourself by wearing long sleeves, pants,  a wide-brimmed hat, and sunglasses.  What should I know about osteoporosis? Osteoporosis is a condition in which bone destruction happens more quickly than new bone creation. After menopause, you may be at an increased risk for osteoporosis. To help prevent osteoporosis or the bone fractures that can happen because of osteoporosis, the following is recommended:  If you are 54-24 years old, get at least 1,000 mg of calcium and at least 600 mg of vitamin D per day.  If you are older than age 96 but younger than age 44, get at least 1,200 mg of calcium and at least 600 mg of vitamin D per day.  If you are older than age 59, get at least 1,200 mg of calcium and at least 800 mg of vitamin D per day.  Smoking and excessive alcohol intake increase the risk of osteoporosis. Eat foods that are rich in calcium and vitamin D, and do weight-bearing exercises several times each week as directed by your health care provider. What should I know about how menopause affects my mental health? Depression may occur at any age, but it is more common as you become older. Common symptoms of depression include:  Low or sad mood.  Changes in sleep patterns.  Changes in appetite or eating patterns.  Feeling an overall lack of motivation or enjoyment of activities that you previously enjoyed.  Frequent crying spells.  Talk with your health care provider if you think that you are experiencing depression. What should I know about immunizations? It is important that you get and maintain your immunizations. These include:  Tetanus, diphtheria, and pertussis (Tdap) booster vaccine.  Influenza every year before the flu season begins.  Pneumonia vaccine.  Shingles vaccine.  Your health care provider may also recommend other immunizations. This information is not intended to replace advice given to you by your health care provider. Make sure you discuss any questions you have with your health care  provider. Document Released: 12/05/2005 Document Revised: 05/02/2016 Document Reviewed: 07/17/2015 Elsevier Interactive Patient Education  2018 [employer].

## 2017-05-14 ENCOUNTER — Ambulatory Visit (HOSPITAL_BASED_OUTPATIENT_CLINIC_OR_DEPARTMENT_OTHER)
Admission: RE | Admit: 2017-05-14 | Discharge: 2017-05-14 | Disposition: A | Discharge status: Home / Self Care | Payer: PPO | Source: Ambulatory Visit | Attending: Internal Medicine | Admitting: Internal Medicine

## 2017-05-14 DIAGNOSIS — M8580 Other specified disorders of bone density and structure, unspecified site: Secondary | ICD-10-CM | POA: Diagnosis not present

## 2017-05-14 DIAGNOSIS — M85852 Other specified disorders of bone density and structure, left thigh: Secondary | ICD-10-CM | POA: Diagnosis not present

## 2017-05-14 DIAGNOSIS — Z780 Asymptomatic menopausal state: Secondary | ICD-10-CM | POA: Diagnosis not present

## 2017-05-18 ENCOUNTER — Encounter: Payer: Self-pay | Admitting: Internal Medicine

## 2017-05-29 ENCOUNTER — Encounter: Payer: Self-pay | Admitting: Obstetrics & Gynecology

## 2017-05-29 ENCOUNTER — Ambulatory Visit (INDEPENDENT_AMBULATORY_CARE_PROVIDER_SITE_OTHER): Payer: PPO | Admitting: Obstetrics & Gynecology

## 2017-05-29 VITALS — BP 146/82 | Ht 63.25 in | Wt 168.0 lb

## 2017-05-29 DIAGNOSIS — N952 Postmenopausal atrophic vaginitis: Secondary | ICD-10-CM | POA: Diagnosis not present

## 2017-05-29 DIAGNOSIS — Z8041 Family history of malignant neoplasm of ovary: Secondary | ICD-10-CM | POA: Diagnosis not present

## 2017-05-29 DIAGNOSIS — Z01411 Encounter for gynecological examination (general) (routine) with abnormal findings: Secondary | ICD-10-CM | POA: Diagnosis not present

## 2017-05-29 DIAGNOSIS — Z780 Asymptomatic menopausal state: Secondary | ICD-10-CM | POA: Diagnosis not present

## 2017-05-29 DIAGNOSIS — Z1151 Encounter for screening for human papillomavirus (HPV): Secondary | ICD-10-CM | POA: Diagnosis not present

## 2017-05-29 NOTE — Addendum Note (Signed)
Addended by: Thurnell Garbe A on: 05/29/2017 10:29 AM   Modules accepted: Orders

## 2017-05-29 NOTE — Patient Instructions (Signed)
1. Encounter for gynecological examination with abnormal finding Gyn exam with Atrophic Vaginitis of Menopause.  Pap reflex done.  Breasts wnl.  2. Menopause present No HRT.  No PMB.  Vit D supplement and Ca++ rich diet recommended.  Weight bearing physical activity.  3. Post-menopausal atrophic vaginitis Not sexually active.  Dry feeling present.  Recommend Replens moisturizer as needed.  4. Family history of ovarian cancer Sister with recent Dx of Ovarian Ca at 81 yo.  No other Fam h/o Ovarian/Breast/Colon Ca.  Therefore not likely a Genetic Familial Cancer risk.  Patient's Gyn exam normal today.  Pelvic US offered, but patient opts to do it only if Sxic or if abnormal Gyn exam.  Angela Clay, it was a pleasure to meet you today!  I will inform you of your results as soon as available.     Health Maintenance for Postmenopausal Women Menopause is a normal process in which your reproductive ability comes to an end. This process happens gradually over a span of months to years, usually between the ages of 1 and 42. Menopause is complete when you have missed 12 consecutive menstrual periods. It is important to talk with your health care provider about some of the most common conditions that affect postmenopausal women, such as heart disease, cancer, and bone loss (osteoporosis). Adopting a healthy lifestyle and getting preventive care can help to promote your health and wellness. Those actions can also lower your chances of developing some of these common conditions. What should I know about menopause? During menopause, you may experience a number of symptoms, such as:  Moderate-to-severe hot flashes.  Night sweats.  Decrease in sex drive.  Mood swings.  Headaches.  Tiredness.  Irritability.  Memory problems.  Insomnia.  Choosing to treat or not to treat menopausal changes is an individual decision that you make with your health care provider. What should I know about hormone  replacement therapy and supplements? Hormone therapy products are effective for treating symptoms that are associated with menopause, such as hot flashes and night sweats. Hormone replacement carries certain risks, especially as you become older. If you are thinking about using estrogen or estrogen with progestin treatments, discuss the benefits and risks with your health care provider. What should I know about heart disease and stroke? Heart disease, heart attack, and stroke become more likely as you age. This may be due, in part, to the hormonal changes that your body experiences during menopause. These can affect how your body processes dietary fats, triglycerides, and cholesterol. Heart attack and stroke are both medical emergencies. There are many things that you can do to help prevent heart disease and stroke:  Have your blood pressure checked at least every 1-2 years. High blood pressure causes heart disease and increases the risk of stroke.  If you are 46-40 years old, ask your health care provider if you should take aspirin to prevent a heart attack or a stroke.  Do not use any tobacco products, including cigarettes, chewing tobacco, or electronic cigarettes. If you need help quitting, ask your health care provider.  It is important to eat a healthy diet and maintain a healthy weight. ? Be sure to include plenty of vegetables, fruits, low-fat dairy products, and lean protein. ? Avoid eating foods that are high in solid fats, added sugars, or salt (sodium).  Get regular exercise. This is one of the most important things that you can do for your health. ? Try to exercise for at least 150 minutes each  week. The type of exercise that you do should increase your heart rate and make you sweat. This is known as moderate-intensity exercise. ? Try to do strengthening exercises at least twice each week. Do these in addition to the moderate-intensity exercise.  Know your numbers.Ask your health  care provider to check your cholesterol and your blood glucose. Continue to have your blood tested as directed by your health care provider.  What should I know about cancer screening? There are several types of cancer. Take the following steps to reduce your risk and to catch any cancer development as early as possible. Breast Cancer  Practice breast self-awareness. ? This means understanding how your breasts normally appear and feel. ? It also means doing regular breast self-exams. Let your health care provider know about any changes, no matter how small.  If you are 47 or older, have a clinician do a breast exam (clinical breast exam or CBE) every year. Depending on your age, family history, and medical history, it may be recommended that you also have a yearly breast X-ray (mammogram).  If you have a family history of breast cancer, talk with your health care provider about genetic screening.  If you are at high risk for breast cancer, talk with your health care provider about having an MRI and a mammogram every year.  Breast cancer (BRCA) gene test is recommended for women who have family members with BRCA-related cancers. Results of the assessment will determine the need for genetic counseling and BRCA1 and for BRCA2 testing. BRCA-related cancers include these types: ? Breast. This occurs in males or females. ? Ovarian. ? Tubal. This may also be called fallopian tube cancer. ? Cancer of the abdominal or pelvic lining (peritoneal cancer). ? Prostate. ? Pancreatic.  Cervical, Uterine, and Ovarian Cancer Your health care provider may recommend that you be screened regularly for cancer of the pelvic organs. These include your ovaries, uterus, and vagina. This screening involves a pelvic exam, which includes checking for microscopic changes to the surface of your cervix (Pap test).  For women ages 21-65, health care providers may recommend a pelvic exam and a Pap test every three years.  For women ages 31-65, they may recommend the Pap test and pelvic exam, combined with testing for human papilloma virus (HPV), every five years. Some types of HPV increase your risk of cervical cancer. Testing for HPV may also be done on women of any age who have unclear Pap test results.  Other health care providers may not recommend any screening for nonpregnant women who are considered low risk for pelvic cancer and have no symptoms. Ask your health care provider if a screening pelvic exam is right for you.  If you have had past treatment for cervical cancer or a condition that could lead to cancer, you need Pap tests and screening for cancer for at least 20 years after your treatment. If Pap tests have been discontinued for you, your risk factors (such as having a new sexual partner) need to be reassessed to determine if you should start having screenings again. Some women have medical problems that increase the chance of getting cervical cancer. In these cases, your health care provider may recommend that you have screening and Pap tests more often.  If you have a family history of uterine cancer or ovarian cancer, talk with your health care provider about genetic screening.  If you have vaginal bleeding after reaching menopause, tell your health care provider.  There are currently  no reliable tests available to screen for ovarian cancer.  Lung Cancer Lung cancer screening is recommended for adults 72-32 years old who are at high risk for lung cancer because of a history of smoking. A yearly low-dose CT scan of the lungs is recommended if you:  Currently smoke.  Have a history of at least 30 pack-years of smoking and you currently smoke or have quit within the past 15 years. A pack-year is smoking an average of one pack of cigarettes per day for one year.  Yearly screening should:  Continue until it has been 15 years since you quit.  Stop if you develop a health problem that would prevent  you from having lung cancer treatment.  Colorectal Cancer  This type of cancer can be detected and can often be prevented.  Routine colorectal cancer screening usually begins at age 73 and continues through age 77.  If you have risk factors for colon cancer, your health care provider may recommend that you be screened at an earlier age.  If you have a family history of colorectal cancer, talk with your health care provider about genetic screening.  Your health care provider may also recommend using home test kits to check for hidden blood in your stool.  A small camera at the end of a tube can be used to examine your colon directly (sigmoidoscopy or colonoscopy). This is done to check for the earliest forms of colorectal cancer.  Direct examination of the colon should be repeated every 5-10 years until age 62. However, if early forms of precancerous polyps or small growths are found or if you have a family history or genetic risk for colorectal cancer, you may need to be screened more often.  Skin Cancer  Check your skin from head to toe regularly.  Monitor any moles. Be sure to tell your health care provider: ? About any new moles or changes in moles, especially if there is a change in a mole's shape or color. ? If you have a mole that is larger than the size of a pencil eraser.  If any of your family members has a history of skin cancer, especially at a young age, talk with your health care provider about genetic screening.  Always use sunscreen. Apply sunscreen liberally and repeatedly throughout the day.  Whenever you are outside, protect yourself by wearing long sleeves, pants, a wide-brimmed hat, and sunglasses.  What should I know about osteoporosis? Osteoporosis is a condition in which bone destruction happens more quickly than new bone creation. After menopause, you may be at an increased risk for osteoporosis. To help prevent osteoporosis or the bone fractures that can  happen because of osteoporosis, the following is recommended:  If you are 39-72 years old, get at least 1,000 mg of calcium and at least 600 mg of vitamin D per day.  If you are older than age 31 but younger than age 59, get at least 1,200 mg of calcium and at least 600 mg of vitamin D per day.  If you are older than age 55, get at least 1,200 mg of calcium and at least 800 mg of vitamin D per day.  Smoking and excessive alcohol intake increase the risk of osteoporosis. Eat foods that are rich in calcium and vitamin D, and do weight-bearing exercises several times each week as directed by your health care provider. What should I know about how menopause affects my mental health? Depression may occur at any age, but it  is more common as you become older. Common symptoms of depression include:  Low or sad mood.  Changes in sleep patterns.  Changes in appetite or eating patterns.  Feeling an overall lack of motivation or enjoyment of activities that you previously enjoyed.  Frequent crying spells.  Talk with your health care provider if you think that you are experiencing depression. What should I know about immunizations? It is important that you get and maintain your immunizations. These include:  Tetanus, diphtheria, and pertussis (Tdap) booster vaccine.  Influenza every year before the flu season begins.  Pneumonia vaccine.  Shingles vaccine.  Your health care provider may also recommend other immunizations. This information is not intended to replace advice given to you by your health care provider. Make sure you discuss any questions you have with your health care provider. Document Released: 12/05/2005 Document Revised: 05/02/2016 Document Reviewed: 07/17/2015 Elsevier Interactive Patient Education  2018 [employer].

## 2017-05-29 NOTE — Progress Notes (Signed)
Angela Clay 1933/10/31 [id number]   History:    83 y.o. [id number] Married  RP:  New patient presenting for annual gyn exam   HPI:  Menopause.  No HRT.  No PMB.  No pelvic pain.  Not sexually active.  C/O Vulvovaginal dryness, but not bothering her too much.  Mictions normal.  BMs wnl.  Breasts wnl.  Sister with recent Dx of Ovarian Ca at 33 yo.  Surgery done, will now do Radiation Therapy.  Patients Grand-child committed suicide at 81 yo last 08/2016.  Past medical history,surgical history, family history and social history were all reviewed and documented in the EPIC chart.  Gynecologic History No LMP recorded. Patient is postmenopausal. Contraception: post menopausal status Last Pap: 2012. Results were: normal Last mammogram: 09/2016. Results were: normal BD 2018 Osteopenia Colono 2014  Obstetric History OB History  Gravida Para Term Preterm AB Living  4 3 3   1 3   SAB TAB Ectopic Multiple Live Births  1       3    # Outcome Date GA Lbr Len/2nd Weight Sex Delivery Anes PTL Lv  4 Term 08/26/71 [redacted]w[redacted]d 7 lb (3.175 kg) F Vag-Spont None N LIV  3 Term 06/10/63 487w0d7 lb (3.175 kg) F Vag-Spont Gen N LIV  2 Term 03/09/61 4010w0d lb (3.175 kg) M Vag-Spont Gen N LIV  1 SAB 1959     SAB          ROS: A ROS was performed and pertinent positives and negatives are included in the history.  GENERAL: No fevers or chills. HEENT: No change in vision, no earache, sore throat or sinus congestion. NECK: No pain or stiffness. CARDIOVASCULAR: No chest pain or pressure. No palpitations. PULMONARY: No shortness of breath, cough or wheeze. GASTROINTESTINAL: No abdominal pain, nausea, vomiting or diarrhea, melena or bright red blood per rectum. GENITOURINARY: No urinary frequency, urgency, hesitancy or dysuria. MUSCULOSKELETAL: No joint or muscle pain, no back pain, no recent trauma. DERMATOLOGIC: No rash, no itching, no lesions. ENDOCRINE: No polyuria, polydipsia, no heat or cold intolerance.  No recent change in weight. HEMATOLOGICAL: No anemia or easy bruising or bleeding. NEUROLOGIC: No headache, seizures, numbness, tingling or weakness. PSYCHIATRIC: No depression, no loss of interest in normal activity or change in sleep pattern.     Exam:   BP (!) 146/82   Ht 5' 3.25" (1.607 m)   Wt 168 lb (76.2 kg)   BMI 29.53 kg/m   Body mass index is 29.53 kg/m.  General appearance : Well developed well nourished female. No acute distress HEENT: Eyes: no retinal hemorrhage or exudates,  Neck supple, trachea midline, no carotid bruits, no thyroidmegaly Lungs: Clear to auscultation, no rhonchi or wheezes, or rib retractions  Heart: Regular rate and rhythm, no murmurs or gallops Breast:Examined in sitting and supine position were symmetrical in appearance, no palpable masses or tenderness,  no skin retraction, no nipple inversion, no nipple discharge, no skin discoloration, no axillary or supraclavicular lymphadenopathy Abdomen: no palpable masses or tenderness, no rebound or guarding Extremities: no edema or skin discoloration or tenderness  Pelvic:  Vulva:  Menopausal atrophy  Bartholin, Urethra, Skene Glands: Within normal limits             Vagina: No gross lesions or discharge.  Atrophic vaginitis present.  Cervix: No gross lesions or discharge.  Pap reflex done.  Uterus  AV, normal size, shape and consistency, non-tender and mobile  Adnexa  Without masses or tenderness  Anus and perineum  normal     Assessment/Plan:  83 y.o. female for annual exam   1. Encounter for gynecological examination with abnormal finding Gyn exam with Atrophic Vaginitis of Menopause.  Pap reflex done.  Breasts wnl.  2. Menopause present No HRT.  No PMB.  Vit D supplement and Ca++ rich diet recommended.  Weight bearing physical activity.  3. Post-menopausal atrophic vaginitis Not sexually active.  Dry feeling present.  Recommend Replens moisturizer as needed.  4. Family history of ovarian  cancer Sister with recent Dx of Ovarian Ca at 49 yo.  No other Fam h/o Ovarian/Breast/Colon Ca.  Therefore not likely a Genetic Familial Cancer risk.  Patient's Gyn exam normal today.  Pelvic US offered, but patient opts to do it only if Sxic or if abnormal Gyn exam.  Counseling on above issues >50% x 20 minutes.  Princess Bruins MD, 9:35 AM 05/29/2017

## 2017-06-03 LAB — PAP IG W/ RFLX HPV ASCU

## 2017-07-16 ENCOUNTER — Encounter: Payer: Self-pay | Admitting: Internal Medicine

## 2017-07-16 ENCOUNTER — Ambulatory Visit (INDEPENDENT_AMBULATORY_CARE_PROVIDER_SITE_OTHER): Payer: PPO | Admitting: Internal Medicine

## 2017-07-16 VITALS — BP 128/72 | HR 68 | Temp 97.6°F | Resp 14 | Ht 61.0 in | Wt 169.5 lb

## 2017-07-16 DIAGNOSIS — I10 Essential (primary) hypertension: Secondary | ICD-10-CM

## 2017-07-16 DIAGNOSIS — E785 Hyperlipidemia, unspecified: Secondary | ICD-10-CM | POA: Diagnosis not present

## 2017-07-16 DIAGNOSIS — I739 Peripheral vascular disease, unspecified: Principal | ICD-10-CM

## 2017-07-16 DIAGNOSIS — Z23 Encounter for immunization: Secondary | ICD-10-CM

## 2017-07-16 DIAGNOSIS — I779 Disorder of arteries and arterioles, unspecified: Secondary | ICD-10-CM

## 2017-07-16 NOTE — Progress Notes (Signed)
Pre visit review using our clinic review tool, if applicable. No additional management support is needed unless otherwise documented below in the visit note. 

## 2017-07-16 NOTE — Patient Instructions (Signed)
   GO TO THE FRONT DESK Schedule your next appointment for a  Physical exam, fasting in 6 months   Will schedule a carotid ultrasound

## 2017-07-16 NOTE — Progress Notes (Signed)
Subjective:    Patient ID: Angela Clay, female    DOB: [birth year], 83 y.o.   MRN: [id number]  DOS:  07/16/2017 Type of visit - description : rov Interval history: No major concerns. Medications reviewed, good compliance without apparent side effects. She likes to proceed w/ a carotid ultrasound.   Review of Systems Denies chest pain or difficulty breathing Joint pains and baseline. No diplopia, slurred speech or facial numbness  Past Medical History:  Diagnosis Date  . Actinic keratosis 05/2015   Right mid central dorsal hand  . GERD (gastroesophageal reflux disease)    EGD for dysphagia 2000 aprox (-)  . History of radiation therapy   . Hyperlipidemia   . Hypertension   . Hypothyroidism   . Lymphoma ([place])    neck,mlig,unspecified site dx 1970s  . OA (osteoarthritis)    severe at neck  . Osteopenia   . Syncope 11-2012   admited . w/u (-)    Past Surgical History:  Procedure Laterality Date  . APPENDECTOMY    . BELPHAROPTOSIS REPAIR Bilateral 2017  . CHOLECYSTECTOMY  02/24/06  . THYROIDECTOMY    . TONSILLECTOMY AND ADENOIDECTOMY  1949  . TUBAL LIGATION  1972    Social History   Social History  . Marital status: Married    Spouse name: Iona Beard  . Number of children: 3  . Years of education: N/A   Occupational History  . retired    Social History Main Topics  . Smoking status: Never Smoker  . Smokeless tobacco: Never Used  . Alcohol use No  . Drug use: No  . Sexual activity: No   Other Topics Concern  . Not on file   Social History Narrative   3 kids, 8 GK   Lives w/ husband   Lost a g-child to suicide 2017            Allergies as of 07/16/2017      Reactions   Codeine Nausea And Vomiting      Medication List       Accurate as of 07/16/17 11:59 PM. Always use your most recent med list.          acetaminophen 500 MG tablet Commonly known as:  TYLENOL Take 500 mg by mouth every 6 (six) hours as needed. For pain   aspirin 81 MG  tablet Take 81 mg by mouth daily.   atorvastatin 40 MG tablet Commonly known as:  LIPITOR Take 1 tablet (40 mg total) by mouth at bedtime.   calcium-vitamin D 250-125 MG-UNIT tablet Commonly known as:  OSCAL Take 1 tablet by mouth.   hydrochlorothiazide 25 MG tablet Commonly known as:  HYDRODIURIL Take 1 tablet (25 mg total) by mouth daily.   levothyroxine 112 MCG tablet Commonly known as:  SYNTHROID, LEVOTHROID Take 1 tablet (112 mcg total) by mouth daily before breakfast.   mometasone 0.1 % ointment Commonly known as:  ELOCON Apply topically daily.   omeprazole 20 MG capsule Commonly known as:  PRILOSEC Take 1 capsule (20 mg total) by mouth daily as needed.            Discharge Care Instructions        Start     Ordered   07/16/17 0000  Flu vaccine HIGH DOSE PF (Fluzone High dose)     07/16/17 1213   07/16/17 0000  US Carotid Duplex Bilateral    Question Answer Comment  Reason for exam: carotid artery disease follow-up  Preferred imaging location? [place]      07/16/17 1213         Objective:   Physical Exam BP 128/72 (BP Location: Left Arm, Patient Position: Sitting, Cuff Size: Small)   Pulse 68   Temp 97.6 F (36.4 C) (Oral)   Resp 14   Ht 5' 1"  (1.549 m)   Wt 169 lb 8 oz (76.9 kg)   SpO2 97%   BMI 32.03 kg/m  General:   Well developed, well nourished . NAD.  HEENT:  Normocephalic . Face symmetric, atraumatic Lungs:  CTA B Normal respiratory effort, no intercostal retractions, no accessory muscle use. Heart: RRR,  no murmur.  Trace pretibial edema bilaterally  Skin: Not pale. Not jaundice Neurologic:  alert & oriented X3.  Speech normal, gait appropriate for age and unassisted Psych--  Cognition and judgment appear intact.  Cooperative with normal attention span and concentration.  Behavior appropriate. No anxious or depressed appearing.      Assessment & Plan:   Assessment Prediabetes Hyperlipidemia HTN Hypothyroidism Carotid artery disease [place] 11-2014: 60-79% RICA stenosis. 37-16% LICA stenosis [place] 06/6788: 1-39 % B. 1 year DJD, severe at the neck GERD, dysphagia remotely Osteopenia: -s/p fosamax 2007 to 2012. -T score -1.7 (01-2015)  -T score -1.8 (04-2017) rx ca- vitamin D Lymphoma,  Neck, 1970s Syncope, admitted-2014, workup negative Shingles: 10/02/2016, left arm  PLAN: Hyperlipidemia: Well-controlled, continue with Lipitor. HTN: On HCTZ, well controlled, last BMP satisfactory. Hypothyroidism: On Synthroid, last tsh ok Carotid artery disease: [place] 2016 show significant stenosis, subsequently the 02/2016 [place] was 1-39%. See previous entries, I discussed with cardiology, will recheck a [place]  this year. If it is still very good (1-39%) we'll stop routine ultrasounds. Flu shot today RTC CPX 6 months

## 2017-07-18 NOTE — Assessment & Plan Note (Signed)
Hyperlipidemia: Well-controlled, continue with Lipitor. HTN: On HCTZ, well controlled, last BMP satisfactory. Hypothyroidism: On Synthroid, last tsh ok Carotid artery disease: [place] 2016 show significant stenosis, subsequently the 02/2016 [place] was 1-39%. See previous entries, I discussed with cardiology, will recheck a [place]  this year. If it is still very good (1-39%) we'll stop routine ultrasounds. Flu shot today RTC CPX 6 months

## 2017-07-23 ENCOUNTER — Ambulatory Visit (HOSPITAL_COMMUNITY)
Admission: RE | Admit: 2017-07-23 | Discharge: 2017-07-23 | Disposition: A | Discharge status: Home / Self Care | Payer: PPO | Source: Ambulatory Visit | Attending: Cardiology | Admitting: Cardiology

## 2017-07-23 DIAGNOSIS — I779 Disorder of arteries and arterioles, unspecified: Secondary | ICD-10-CM | POA: Diagnosis not present

## 2017-07-23 DIAGNOSIS — I6523 Occlusion and stenosis of bilateral carotid arteries: Secondary | ICD-10-CM | POA: Insufficient documentation

## 2017-09-23 ENCOUNTER — Other Ambulatory Visit: Payer: Self-pay | Admitting: Internal Medicine

## 2017-11-12 ENCOUNTER — Other Ambulatory Visit: Payer: Self-pay | Admitting: Internal Medicine

## 2017-11-13 DIAGNOSIS — Z1231 Encounter for screening mammogram for malignant neoplasm of breast: Secondary | ICD-10-CM | POA: Diagnosis not present

## 2017-11-18 ENCOUNTER — Ambulatory Visit: Payer: Self-pay

## 2017-11-18 ENCOUNTER — Encounter: Payer: Self-pay | Admitting: Internal Medicine

## 2017-11-18 ENCOUNTER — Ambulatory Visit (INDEPENDENT_AMBULATORY_CARE_PROVIDER_SITE_OTHER): Payer: PPO | Admitting: Internal Medicine

## 2017-11-18 VITALS — BP 124/76 | HR 84 | Temp 97.6°F | Resp 14 | Ht 61.0 in | Wt 171.1 lb

## 2017-11-18 DIAGNOSIS — R42 Dizziness and giddiness: Secondary | ICD-10-CM

## 2017-11-18 MED ORDER — MECLIZINE HCL 12.5 MG PO TABS: 12.5000 mg | ORAL_TABLET | Freq: Three times a day (TID) | ORAL | 0 refills | Status: DC | PRN

## 2017-11-18 NOTE — Progress Notes (Signed)
Subjective:    Patient ID: Angela Clay, female    DOB: 02-26-1934, 84 y.o.   MRN: [id number]  DOS:  11/18/2017 Type of visit - description : acute Interval history: Symptoms started 11/15/2017, she woke up feeling well, turned while still in bed and started with dizziness described as spinning. The symptoms came back every time she leans her head over or back.  They do not happen when she turns her head to the left or right. The symptoms have been consistent every time she move her head but overall today they seem less noticeable. She has been slightly nauseous but has no vomited. Her husband gave her meclizine which she took.  Not completely clear if it helped or not.  Review of Systems No fever chills No chest pain or palpitations No abdominal pain no diarrhea No head injury No headache, diplopia, slurred speech, face numbness or motor deficits.   Past Medical History:  Diagnosis Date  . Actinic keratosis 05/2015   Right mid central dorsal hand  . GERD (gastroesophageal reflux disease)    EGD for dysphagia 2000 aprox (-)  . History of radiation therapy   . Hyperlipidemia   . Hypertension   . Hypothyroidism   . Lymphoma ([place])    neck,mlig,unspecified site dx 1970s  . OA (osteoarthritis)    severe at neck  . Osteopenia   . Syncope 11-2012   admited . w/u (-)    Past Surgical History:  Procedure Laterality Date  . APPENDECTOMY    . BELPHAROPTOSIS REPAIR Bilateral 2017  . CHOLECYSTECTOMY  02/24/06  . THYROIDECTOMY    . TONSILLECTOMY AND ADENOIDECTOMY  1949  . TUBAL LIGATION  1972    Social History   Socioeconomic History  . Marital status: Married    Spouse name: Iona Beard  . Number of children: 3  . Years of education: Not on file  . Highest education level: Not on file  Social Needs  . Financial resource strain: Not on file  . Food insecurity - worry: Not on file  . Food insecurity - inability: Not on file  . Transportation needs - medical: Not on file  .  Transportation needs - non-medical: Not on file  Occupational History  . Occupation: retired  Tobacco Use  . Smoking status: Never Smoker  . Smokeless tobacco: Never Used  Substance and Sexual Activity  . Alcohol use: No    Alcohol/week: 0.0 oz  . Drug use: No  . Sexual activity: No    Partners: Male    Birth control/protection: Post-menopausal  Other Topics Concern  . Not on file  Social History Narrative   3 kids, 8 GK   Lives w/ husband   Lost a g-child to suicide 2017         Allergies as of 11/18/2017      Reactions   Codeine Nausea And Vomiting      Medication List        Accurate as of 11/18/17 11:59 PM. Always use your most recent med list.          acetaminophen 500 MG tablet Commonly known as:  TYLENOL Take 500 mg by mouth every 6 (six) hours as needed. For pain   aspirin 81 MG tablet Take 81 mg by mouth daily.   atorvastatin 40 MG tablet Commonly known as:  LIPITOR Take 1 tablet (40 mg total) by mouth at bedtime.   calcium-vitamin D 250-125 MG-UNIT tablet Commonly known as:  OSCAL Take 1  tablet by mouth.   hydrochlorothiazide 25 MG tablet Commonly known as:  HYDRODIURIL Take 1 tablet (25 mg total) by mouth daily.   levothyroxine 112 MCG tablet Commonly known as:  SYNTHROID, LEVOTHROID Take 1 tablet (112 mcg total) by mouth daily before breakfast.   meclizine 12.5 MG tablet Commonly known as:  ANTIVERT Take 1 tablet (12.5 mg total) by mouth 3 (three) times daily as needed for dizziness.   mometasone 0.1 % ointment Commonly known as:  ELOCON Apply topically daily.   omeprazole 20 MG capsule Commonly known as:  PRILOSEC Take 1 capsule (20 mg total) by mouth daily as needed.          Objective:   Physical Exam BP 124/76 (BP Location: Left Arm, Patient Position: Sitting, Cuff Size: Small)   Pulse 84   Temp 97.6 F (36.4 C) (Oral)   Resp 14   Ht 5' 1"  (1.549 m)   Wt 171 lb 2 oz (77.6 kg)   SpO2 96%   BMI 32.33 kg/m  General:     Well developed, well nourished . NAD.  HEENT:  Normocephalic . Face symmetric, atraumatic.  EOMI, no nystagmus Neck: Limited range of motion, at baseline. Lungs:  CTA B Normal respiratory effort, no intercostal retractions, no accessory muscle use. Heart: RRR,  no murmur.  No pretibial edema bilaterally  Skin: Not pale. Not jaundice Neurologic:  alert & oriented X3.  Speech normal, gait appropriate for age and unassisted.  Motor symmetric, DTRs symmetric, Romberg absent. psych--  Cognition and judgment appear intact.  Cooperative with normal attention span and concentration.  Behavior appropriate. No anxious or depressed appearing.      Assessment & Plan:   Assessment Prediabetes Hyperlipidemia HTN Hypothyroidism Carotid artery disease [place] 11-2014: 60-79% RICA stenosis. 60-15% LICA stenosis [place] 03/1536: 1-39 % B. 1 year DJD, severe at the neck GERD, dysphagia remotely Osteopenia: -s/p fosamax 2007 to 2012. -T score -1.7 (01-2015)  -T score -1.8 (04-2017) rx ca- vitamin D Lymphoma,  Neck, 1970s Syncope, admitted-2014, workup negative Shingles: 10/02/2016, left arm  PLAN: Vertigo: As described above, likely a peripheral issue.  Rx rest, Antivert, call if not gradually improving.  She is also recommend to call or go to the ER if she has any severe or unusual symptoms that may suggest something else such as a stroke.  She verbalized understanding. If she is not better, could consider ENT or PT referral for vestibular rehab or a maneuver. Carotid artery disease: Last [place] 06-2017, stable, no need to schedule routine [place] anymore

## 2017-11-18 NOTE — Progress Notes (Signed)
Pre visit review using our clinic review tool, if applicable. No additional management support is needed unless otherwise documented below in the visit note. 

## 2017-11-18 NOTE — Patient Instructions (Signed)
Rest  Drink plenty of fluids  Take Antivert as needed for dizziness  Call if you are not gradually better  Call if you have any different symptoms or strokelike symptoms  Call if you are not completely well in 10 days

## 2017-11-18 NOTE — Telephone Encounter (Signed)
Patient called in for c/o "dizziness." She says "It started on Sunday and it's worse when I bend over to pick something up or when I move my head different positions." She denies feeling faint. She says she walks normally and doesn't have to hold on to anything. She reports nausea a couple of times, but no vomiting. According to protocol, see PCP within 3 days, appointment was already made for today by [facility] Agent prior to triage call, care advice given, patient verbalized understanding.   Reason for Disposition . [1] MILD dizziness (e.g., walking normally) AND [2] has NOT been evaluated by physician for this  (Exception: dizziness caused by heat exposure, sudden standing, or poor fluid intake)  Answer Assessment - Initial Assessment Questions 1. DESCRIPTION: "Describe your dizziness."     Top head feels different, lean over to pick up something get dizzy 2. LIGHTHEADED: "Do you feel lightheaded?" (e.g., somewhat faint, woozy, weak upon standing)     Not really 3. VERTIGO: "Do you feel like either you or the room is spinning or tilting?" (i.e. vertigo)     No 4. SEVERITY: "How bad is it?"  "Do you feel like you are going to faint?" "Can you stand and walk?"   - MILD - walking normally   - MODERATE - interferes with normal activities (e.g., work, school)    - SEVERE - unable to stand, requires support to walk, feels like passing out now.      Mild 5. ONSET:  "When did the dizziness begin?"     Sunday 6. AGGRAVATING FACTORS: "Does anything make it worse?" (e.g., standing, change in head position)     Changing head position 7. HEART RATE: "Can you tell me your heart rate?" "How many beats in 15 seconds?"  (Note: not all patients can do this)       72 -checked with oxygen sensor 8. CAUSE: "What do you think is causing the dizziness?"     Unknown 9. RECURRENT SYMPTOM: "Have you had dizziness before?" If so, ask: "When was the last time?" "What happened that time?"    No 10. OTHER SYMPTOMS: "Do  you have any other symptoms?" (e.g., fever, chest pain, vomiting, diarrhea, bleeding)       Nauseated a couple of times 11. PREGNANCY: "Is there any chance you are pregnant?" "When was your last menstrual period?"      No  Protocols used: DIZZINESS [facility]

## 2017-11-19 NOTE — Assessment & Plan Note (Signed)
Vertigo: As described above, likely a peripheral issue.  Rx rest, Antivert, call if not gradually improving.  She is also recommend to call or go to the ER if she has any severe or unusual symptoms that may suggest something else such as a stroke.  She verbalized understanding. If she is not better, could consider ENT or PT referral for vestibular rehab or a maneuver. Carotid artery disease: Last [place] 06-2017, stable, no need to schedule routine [place] anymore

## 2017-12-21 DIAGNOSIS — L723 Sebaceous cyst: Secondary | ICD-10-CM | POA: Diagnosis not present

## 2017-12-21 DIAGNOSIS — L820 Inflamed seborrheic keratosis: Secondary | ICD-10-CM | POA: Diagnosis not present

## 2017-12-21 DIAGNOSIS — Z85828 Personal history of other malignant neoplasm of skin: Secondary | ICD-10-CM | POA: Diagnosis not present

## 2017-12-21 DIAGNOSIS — L821 Other seborrheic keratosis: Secondary | ICD-10-CM | POA: Diagnosis not present

## 2017-12-21 DIAGNOSIS — L570 Actinic keratosis: Secondary | ICD-10-CM | POA: Diagnosis not present

## 2018-01-07 ENCOUNTER — Encounter: Payer: Self-pay | Admitting: Internal Medicine

## 2018-01-07 ENCOUNTER — Ambulatory Visit (INDEPENDENT_AMBULATORY_CARE_PROVIDER_SITE_OTHER): Payer: PPO | Admitting: Internal Medicine

## 2018-01-07 VITALS — BP 122/78 | HR 84 | Temp 98.3°F | Resp 16 | Ht 65.0 in | Wt 170.2 lb

## 2018-01-07 DIAGNOSIS — R319 Hematuria, unspecified: Secondary | ICD-10-CM

## 2018-01-07 DIAGNOSIS — N390 Urinary tract infection, site not specified: Secondary | ICD-10-CM | POA: Diagnosis not present

## 2018-01-07 LAB — POC URINALSYSI DIPSTICK (AUTOMATED)
Bilirubin, UA: NEGATIVE
Glucose, UA: NEGATIVE
Ketones, UA: NEGATIVE
Nitrite, UA: NEGATIVE
Protein, UA: NEGATIVE
Spec Grav, UA: 1.015 (ref 1.010–1.025)
Urobilinogen, UA: 0.2 E.U./dL
pH, UA: 6 (ref 5.0–8.0)

## 2018-01-07 MED ORDER — SULFAMETHOXAZOLE-TRIMETHOPRIM 800-160 MG PO TABS: 1.0000 | ORAL_TABLET | Freq: Two times a day (BID) | ORAL | 0 refills | Status: DC

## 2018-01-07 NOTE — Progress Notes (Signed)
Pre visit review using our clinic review tool, if applicable. No additional management support is needed unless otherwise documented below in the visit note. 

## 2018-01-07 NOTE — Patient Instructions (Signed)
Take the antibiotic as prescribed for 3 days  Drink plenty of fluids  Call if you are not gradually better

## 2018-01-07 NOTE — Progress Notes (Signed)
Subjective:    Patient ID: Angela Clay, female    DOB: 04-20-1934, 84 y.o.   MRN: [id number]  DOS:  01/07/2018 Type of visit - description : acute Interval history: Sx started 2 days ago: Dysuria, mild suprapubic discomfort. Today,  she went to the bathroom several times and in two episodes saw a small amount of blood in the urine.  Review of Systems No fever chills No generalized abdominal pain or flank pain No nausea or vomiting No vaginal discharge or bleeding + Urinary frequency.  Past Medical History:  Diagnosis Date  . Actinic keratosis 05/2015   Right mid central dorsal hand  . GERD (gastroesophageal reflux disease)    EGD for dysphagia 2000 aprox (-)  . History of radiation therapy   . Hyperlipidemia   . Hypertension   . Hypothyroidism   . Lymphoma ([place])    neck,mlig,unspecified site dx 1970s  . OA (osteoarthritis)    severe at neck  . Osteopenia   . Syncope 11-2012   admited . w/u (-)    Past Surgical History:  Procedure Laterality Date  . APPENDECTOMY    . BELPHAROPTOSIS REPAIR Bilateral 2017  . CHOLECYSTECTOMY  02/24/06  . THYROIDECTOMY    . TONSILLECTOMY AND ADENOIDECTOMY  1949  . TUBAL LIGATION  1972    Social History   Socioeconomic History  . Marital status: Married    Spouse name: Iona Beard  . Number of children: 3  . Years of education: Not on file  . Highest education level: Not on file  Social Needs  . Financial resource strain: Not on file  . Food insecurity - worry: Not on file  . Food insecurity - inability: Not on file  . Transportation needs - medical: Not on file  . Transportation needs - non-medical: Not on file  Occupational History  . Occupation: retired  Tobacco Use  . Smoking status: Never Smoker  . Smokeless tobacco: Never Used  Substance and Sexual Activity  . Alcohol use: No    Alcohol/week: 0.0 oz  . Drug use: No  . Sexual activity: No    Partners: Male    Birth control/protection: Post-menopausal  Other Topics  Concern  . Not on file  Social History Narrative   3 kids, 8 GK   Lives w/ husband   Lost a g-child to suicide 2017         Allergies as of 01/07/2018      Reactions   Codeine Nausea And Vomiting      Medication List        Accurate as of 01/07/18 11:59 PM. Always use your most recent med list.          acetaminophen 500 MG tablet Commonly known as:  TYLENOL Take 500 mg by mouth every 6 (six) hours as needed. For pain   aspirin 81 MG tablet Take 81 mg by mouth daily.   atorvastatin 40 MG tablet Commonly known as:  LIPITOR Take 1 tablet (40 mg total) by mouth at bedtime.   calcium-vitamin D 250-125 MG-UNIT tablet Commonly known as:  OSCAL Take 1 tablet by mouth.   hydrochlorothiazide 25 MG tablet Commonly known as:  HYDRODIURIL Take 1 tablet (25 mg total) by mouth daily.   levothyroxine 112 MCG tablet Commonly known as:  SYNTHROID, LEVOTHROID Take 1 tablet (112 mcg total) by mouth daily before breakfast.   meclizine 12.5 MG tablet Commonly known as:  ANTIVERT Take 1 tablet (12.5 mg total) by mouth 3 (  three) times daily as needed for dizziness.   mometasone 0.1 % ointment Commonly known as:  ELOCON Apply topically daily.   omeprazole 20 MG capsule Commonly known as:  PRILOSEC Take 1 capsule (20 mg total) by mouth daily as needed.   sulfamethoxazole-trimethoprim 800-160 MG tablet Commonly known as:  BACTRIM DS,SEPTRA DS Take 1 tablet by mouth 2 (two) times daily.          Objective:   Physical Exam  Abdominal:     BP 122/78 (BP Location: Left Arm, Patient Position: Sitting, Cuff Size: Small)   Pulse 84   Temp 98.3 F (36.8 C) (Oral)   Resp 16   Ht 5' 5"  (1.651 m)   Wt 170 lb 4 oz (77.2 kg)   SpO2 96%   BMI 28.33 kg/m  General:   Well developed, well nourished . NAD.  HEENT:  Normocephalic . Face symmetric, atraumatic  Abdomen:  Not distended, soft,. No rebound or rigidity. No CVA tenderness Skin: Not pale. Not jaundice Neurologic:    alert & oriented X3.  Speech normal, gait appropriate for age and unassisted Psych--  Cognition and judgment appear intact.  Cooperative with normal attention span and concentration.  Behavior appropriate. No anxious or depressed appearing.     Assessment & Plan:   Assessment Prediabetes Hyperlipidemia HTN Hypothyroidism Carotid artery disease [place] 11-2014: 60-79% RICA stenosis. 77-41% LICA stenosis [place] 01/2394: 1-39 % B. 1 year DJD, severe at the neck GERD, dysphagia remotely Osteopenia: -s/p fosamax 2007 to 2012. -T score -1.7 (01-2015)  -T score -1.8 (04-2017) rx ca- vitamin D Lymphoma,  Neck, 1970s Syncope, admitted-2014, workup negative Shingles: 10/02/2016, left arm  PLAN: UTI: Udip + blood, sx   c/w  a UTI, no h/o frequent or repetitive UTI, no systemic sxs.  Will send a UA, urine culture, start Bactrim empirically.  Will call if not better.

## 2018-01-08 LAB — URINE CULTURE
MICRO NUMBER:: 90325502
SPECIMEN QUALITY:: ADEQUATE

## 2018-01-08 NOTE — Assessment & Plan Note (Signed)
UTI: Udip + blood, sx   c/w  a UTI, no h/o frequent or repetitive UTI, no systemic sxs.  Will send a UA, urine culture, start Bactrim empirically.  Will call if not better.

## 2018-01-13 ENCOUNTER — Ambulatory Visit: Payer: PPO | Admitting: Internal Medicine

## 2018-01-14 ENCOUNTER — Other Ambulatory Visit: Payer: Self-pay

## 2018-01-15 ENCOUNTER — Encounter: Payer: Self-pay | Admitting: Internal Medicine

## 2018-01-15 ENCOUNTER — Ambulatory Visit (INDEPENDENT_AMBULATORY_CARE_PROVIDER_SITE_OTHER): Payer: PPO | Admitting: Internal Medicine

## 2018-01-15 VITALS — BP 124/68 | HR 67 | Temp 98.2°F | Resp 14 | Ht 65.0 in | Wt 168.0 lb

## 2018-01-15 DIAGNOSIS — R739 Hyperglycemia, unspecified: Secondary | ICD-10-CM

## 2018-01-15 DIAGNOSIS — Z0000 Encounter for general adult medical examination without abnormal findings: Secondary | ICD-10-CM

## 2018-01-15 DIAGNOSIS — Z23 Encounter for immunization: Secondary | ICD-10-CM

## 2018-01-15 LAB — CBC WITH DIFFERENTIAL/PLATELET
Basophils Absolute: 0 10*3/uL (ref 0.0–0.1)
Basophils Relative: 0.7 % (ref 0.0–3.0)
Eosinophils Absolute: 0.2 10*3/uL (ref 0.0–0.7)
Eosinophils Relative: 3.5 % (ref 0.0–5.0)
HCT: 40.9 % (ref 36.0–46.0)
Hemoglobin: 13.8 g/dL (ref 12.0–15.0)
Lymphocytes Relative: 21.8 % (ref 12.0–46.0)
Lymphs Abs: 1.3 10*3/uL (ref 0.7–4.0)
MCHC: 33.9 g/dL (ref 30.0–36.0)
MCV: 87.2 fl (ref 78.0–100.0)
Monocytes Absolute: 0.4 10*3/uL (ref 0.1–1.0)
Monocytes Relative: 6.8 % (ref 3.0–12.0)
Neutro Abs: 4 10*3/uL (ref 1.4–7.7)
Neutrophils Relative %: 67.2 % (ref 43.0–77.0)
Platelets: 243 10*3/uL (ref 150.0–400.0)
RBC: 4.68 Mil/uL (ref 3.87–5.11)
RDW: 15.1 % (ref 11.5–15.5)
WBC: 5.9 10*3/uL (ref 4.0–10.5)

## 2018-01-15 LAB — COMPREHENSIVE METABOLIC PANEL
ALT: 27 U/L (ref 0–35)
AST: 22 U/L (ref 0–37)
Albumin: 4.3 g/dL (ref 3.5–5.2)
Alkaline Phosphatase: 81 U/L (ref 39–117)
BUN: 18 mg/dL (ref 6–23)
CO2: 30 mEq/L (ref 19–32)
Calcium: 10.3 mg/dL (ref 8.4–10.5)
Chloride: 102 mEq/L (ref 96–112)
Creatinine, Ser: 0.92 mg/dL (ref 0.40–1.20)
GFR: 62.09 mL/min (ref >60.00)
Glucose, Bld: 106 mg/dL — ABNORMAL HIGH (ref 70–99)
Potassium: 4.7 mEq/L (ref 3.5–5.1)
Sodium: 145 mEq/L (ref 135–145)
Total Bilirubin: 0.7 mg/dL (ref 0.2–1.2)
Total Protein: 7.8 g/dL (ref 6.0–8.3)

## 2018-01-15 LAB — HEMOGLOBIN A1C: Hgb A1c MFr Bld: 6.1 % (ref 4.6–6.5)

## 2018-01-15 LAB — TSH: TSH: 0.42 u[IU]/mL (ref 0.35–4.50)

## 2018-01-15 LAB — LIPID PANEL
Cholesterol: 153 mg/dL (ref 0–200)
HDL: 56.6 mg/dL (ref >39.00)
LDL Cholesterol: 70 mg/dL (ref 0–99)
NonHDL: 96.71
Total CHOL/HDL Ratio: 3
Triglycerides: 135 mg/dL (ref 0.0–149.0)
VLDL: 27 mg/dL (ref 0.0–40.0)

## 2018-01-15 NOTE — Progress Notes (Signed)
Subjective:    Patient ID: Angela Clay, female    DOB: 1933/12/14, 84 y.o.   MRN: [id number]  DOS:  01/15/2018 Type of visit - description : cpx Interval history:   No major concerns   Review of Systems  A 14 point review of systems is negative    Past Medical History:  Diagnosis Date  . Actinic keratosis 05/2015   Right mid central dorsal hand  . GERD (gastroesophageal reflux disease)    EGD for dysphagia 2000 aprox (-)  . History of radiation therapy   . Hyperlipidemia   . Hypertension   . Hypothyroidism   . Lymphoma ([place])    neck,mlig,unspecified site dx 1970s  . OA (osteoarthritis)    severe at neck  . Osteopenia   . Syncope 11-2012   admited . w/u (-)    Past Surgical History:  Procedure Laterality Date  . APPENDECTOMY    . BELPHAROPTOSIS REPAIR Bilateral 2017  . CHOLECYSTECTOMY  02/24/06  . THYROIDECTOMY    . TONSILLECTOMY AND ADENOIDECTOMY  1949  . TUBAL LIGATION  1972    Social History   Socioeconomic History  . Marital status: Married    Spouse name: Iona Beard  . Number of children: 3  . Years of education: Not on file  . Highest education level: Not on file  Occupational History  . Occupation: retired  Scientific laboratory technician  . Financial resource strain: Not on file  . Food insecurity:    Worry: Not on file    Inability: Not on file  . Transportation needs:    Medical: Not on file    Non-medical: Not on file  Tobacco Use  . Smoking status: Never Smoker  . Smokeless tobacco: Never Used  Substance and Sexual Activity  . Alcohol use: No    Alcohol/week: 0.0 oz  . Drug use: No  . Sexual activity: Never    Partners: Male    Birth control/protection: Post-menopausal  Lifestyle  . Physical activity:    Days per week: Not on file    Minutes per session: Not on file  . Stress: Not on file  Relationships  . Social connections:    Talks on phone: Not on file    Gets together: Not on file    Attends religious service: Not on file    Active member of  club or organization: Not on file    Attends meetings of clubs or organizations: Not on file    Relationship status: Not on file  . Intimate partner violence:    Fear of current or ex partner: Not on file    Emotionally abused: Not on file    Physically abused: Not on file    Forced sexual activity: Not on file  Other Topics Concern  . Not on file  Social History Narrative   3 kids, 8 GK   Lives w/ husband   Lost a g-child to suicide 2017      Family History  Problem Relation Age of Onset  . Stroke Mother 19  . Coronary artery disease Father        MI age 73  . Deep vein thrombosis Brother        PE at age 64  . Heart failure Sister   . Ovarian cancer Sister   . Diabetes Neg Hx   . Colon cancer Neg Hx   . Breast cancer Neg Hx       Allergies as of 01/15/2018  Reactions   Codeine Nausea And Vomiting      Medication List        Accurate as of 01/15/18  5:55 PM. Always use your most recent med list.          acetaminophen 500 MG tablet Commonly known as:  TYLENOL Take 500 mg by mouth every 6 (six) hours as needed. For pain   aspirin 81 MG tablet Take 81 mg by mouth daily.   atorvastatin 40 MG tablet Commonly known as:  LIPITOR Take 1 tablet (40 mg total) by mouth at bedtime.   calcium-vitamin D 250-125 MG-UNIT tablet Commonly known as:  OSCAL Take 1 tablet by mouth.   hydrochlorothiazide 25 MG tablet Commonly known as:  HYDRODIURIL Take 1 tablet (25 mg total) by mouth daily.   levothyroxine 112 MCG tablet Commonly known as:  SYNTHROID, LEVOTHROID Take 1 tablet (112 mcg total) by mouth daily before breakfast.   mometasone 0.1 % ointment Commonly known as:  ELOCON Apply topically daily.   omeprazole 20 MG capsule Commonly known as:  PRILOSEC Take 1 capsule (20 mg total) by mouth daily as needed.          Objective:   Physical Exam BP 124/68 (BP Location: Left Arm, Patient Position: Sitting, Cuff Size: Small)   Pulse 67   Temp 98.2 F  (36.8 C) (Oral)   Resp 14   Ht 5' 5"  (1.651 m)   Wt 168 lb (76.2 kg)   SpO2 97%   BMI 27.96 kg/m  General:   Well developed, well nourished . NAD.  Neck: No  thyromegaly.  Deformities consistent with DJD HEENT:  Normocephalic . Face symmetric, atraumatic Lungs:  CTA B Normal respiratory effort, no intercostal retractions, no accessory muscle use. Heart: RRR,  no murmur.  No pretibial edema bilaterally  Abdomen:  Not distended, soft, non-tender. No rebound or rigidity.   Skin: Exposed areas without rash. Not pale. Not jaundice Neurologic:  alert & oriented X3.  Speech normal, gait appropriate for age and unassisted Strength symmetric and appropriate for age.  Psych: Cognition and judgment appear intact.  Cooperative with normal attention span and concentration.  Behavior appropriate. No anxious or depressed appearing.     Assessment & Plan:   Assessment Prediabetes Hyperlipidemia HTN Hypothyroidism Carotid artery disease [place] 11-2014: 60-79% RICA stenosis. 25-36% LICA stenosis [place] 03/4402: 1-39 % B. 1 year [place] 06/2017 1-39%, no further routine [place] DJD, severe at the neck GERD, dysphagia remotely Osteopenia: -s/p fosamax 2007 to 2012. -T score -1.7 (01-2015)  -T score -1.8 (04-2017) rx ca- vitamin D Lymphoma,  Neck, 1970s Syncope, admitted-2014, workup negative Shingles: 10/02/2016, left arm  PLAN: Prediabetes: Continue with healthy lifestyle, check A1c Hyperlipidemia: Continue with Lipitor, checking labs HTN: On HCTZ, checking labs Hypothyroidism: On Synthroid, check a TSH Carotid artery disease: Last ultrasound 06-2017, 1 to 39%, no further routine USs Osteopenia: On calcium and vitamin D. RTC 1 year

## 2018-01-15 NOTE — Progress Notes (Signed)
Pre visit review using our clinic review tool, if applicable. No additional management support is needed unless otherwise documented below in the visit note. 

## 2018-01-15 NOTE — Assessment & Plan Note (Signed)
Prediabetes: Continue with healthy lifestyle, check A1c Hyperlipidemia: Continue with Lipitor, checking labs HTN: On HCTZ, checking labs Hypothyroidism: On Synthroid, check a TSH Carotid artery disease: Last ultrasound 06-2017, 1 to 39%, no further routine USs Osteopenia: On calcium and vitamin D. RTC 1 year

## 2018-01-15 NOTE — Patient Instructions (Signed)
GO TO THE LAB : Get the blood work     GO TO THE FRONT DESK Schedule your next appointment for a physical exam in 1 year.  Please come sooner if needed

## 2018-01-15 NOTE — Assessment & Plan Note (Addendum)
--  Td 11-09; PNM 25  booster 12-2017; prevnar 2015; shingles shot [id number]  --[facility]--10/2012--Dr Brodie--benign polyp--no recall needed  - Female care:  02-2011, vulvar biopsy was negative ,uses clobetasol prn Saw gyn 05/2017, had a PAP (-), had a  pelvic exam, was told to RTC periodically  --MMG--09-2016 benign . Reports had another MMG later ([facility]) , no report. Further MMG only if pt desires  --Cont healthy life style --Labs: CMP, FLP, CBC, A1c, TSH

## 2018-02-09 DIAGNOSIS — Z85828 Personal history of other malignant neoplasm of skin: Secondary | ICD-10-CM | POA: Diagnosis not present

## 2018-02-09 DIAGNOSIS — L570 Actinic keratosis: Secondary | ICD-10-CM | POA: Diagnosis not present

## 2018-02-09 DIAGNOSIS — L821 Other seborrheic keratosis: Secondary | ICD-10-CM | POA: Diagnosis not present

## 2018-03-21 ENCOUNTER — Other Ambulatory Visit: Payer: Self-pay | Admitting: Internal Medicine

## 2018-04-19 DIAGNOSIS — H2513 Age-related nuclear cataract, bilateral: Secondary | ICD-10-CM | POA: Diagnosis not present

## 2018-04-19 DIAGNOSIS — H5203 Hypermetropia, bilateral: Secondary | ICD-10-CM | POA: Diagnosis not present

## 2018-04-19 DIAGNOSIS — H35373 Puckering of macula, bilateral: Secondary | ICD-10-CM | POA: Diagnosis not present

## 2018-05-10 ENCOUNTER — Other Ambulatory Visit: Payer: Self-pay | Admitting: Internal Medicine

## 2018-05-11 DIAGNOSIS — L820 Inflamed seborrheic keratosis: Secondary | ICD-10-CM | POA: Diagnosis not present

## 2018-05-11 DIAGNOSIS — L80 Vitiligo: Secondary | ICD-10-CM | POA: Diagnosis not present

## 2018-05-11 DIAGNOSIS — L821 Other seborrheic keratosis: Secondary | ICD-10-CM | POA: Diagnosis not present

## 2018-05-11 DIAGNOSIS — L720 Epidermal cyst: Secondary | ICD-10-CM | POA: Diagnosis not present

## 2018-05-11 DIAGNOSIS — Z85828 Personal history of other malignant neoplasm of skin: Secondary | ICD-10-CM | POA: Diagnosis not present

## 2018-05-11 DIAGNOSIS — L570 Actinic keratosis: Secondary | ICD-10-CM | POA: Diagnosis not present

## 2018-05-12 ENCOUNTER — Ambulatory Visit: Payer: PPO | Admitting: *Deleted

## 2018-05-19 NOTE — Progress Notes (Addendum)
Subjective:   Angela Clay is a 83 y.o. female who presents for Medicare Annual (Subsequent) preventive examination.  Pt loves to read and do color by number on her phone. Pt goes to [employer] 3x/ week.  Review of Systems: No ROS.  Medicare Wellness Visit. Additional risk factors are reflected in the social history. Cardiac Risk Factors include: advanced age (>31mn, >>23women);dyslipidemia;hypertension Sleep patterns: about 6-8 hrs. Naps as needed. Home Safety/Smoke Alarms: Feels safe in home. Smoke alarms in place.  Living environment; residence and Firearm Safety: Lives with husband. No stairs. Step-over-tub with grab rails.  Eye- Dr.Lyles. Wears bifocals  Female:        Mammo- pt reports last 10/2017-normal      Dexa scan-utd            Objective:     Vitals: BP 120/64 (BP Location: Left Arm, Patient Position: Sitting, Cuff Size: Normal)   Pulse 80   Ht 5' 5"  (1.651 m)   Wt 167 lb (75.8 kg)   SpO2 98%   BMI 27.79 kg/m   Body mass index is 27.79 kg/m.  Advanced Directives 05/25/2018 05/11/2017 12/01/2012  Does Patient Have a Medical Advance Directive? Yes Yes Patient has advance directive, copy not in chart  Type of Advance Directive Living will Living will -  Does patient want to make changes to medical advance directive? No - Patient declined - -  [place]? - - Copy requested from family    Tobacco Social History   Tobacco Use  Smoking Status Never Smoker  Smokeless Tobacco Never Used     Counseling given: Not Answered   Clinical Intake: Pain : No/denies pain    Past Medical History:  Diagnosis Date  . Actinic keratosis 05/2015   Right mid central dorsal hand  . GERD (gastroesophageal reflux disease)    EGD for dysphagia 2000 aprox (-)  . History of radiation therapy   . Hyperlipidemia   . Hypertension   . Hypothyroidism   . [place]specified site dx 1970s  . OA (osteoarthritis)    severe at neck  . Osteopenia   . Syncope 11-2012   admited . w/u (-)   Past Surgical History:  Procedure Laterality Date  . APPENDECTOMY    . BELPHAROPTOSIS REPAIR Bilateral 2017  . CHOLECYSTECTOMY  02/24/06  . THYROIDECTOMY    . TONSILLECTOMY AND ADENOIDECTOMY  1949  . TUBAL LIGATION  1972   Family History  Problem Relation Age of Onset  . Stroke Mother 853 . Coronary artery disease Father        MI age 82 . Deep vein thrombosis Brother        PE at age 82 . Heart failure Sister   . Ovarian cancer Sister   . Diabetes Neg Hx   . Colon cancer Neg Hx   . Breast cancer Neg Hx    Social History   Socioeconomic History  . Marital status: Married    Spouse name: GIona Beard . Number of children: 3  . Years of education: Not on file  . Highest education level: Not on file  Occupational History  . Occupation: retired  SScientific laboratory technician . Financial resource strain: Not on file  . Food insecurity:    Worry: Not on file    Inability: Not on file  . Transportation needs:    Medical: Not on file    Non-medical:  Not on file  Tobacco Use  . Smoking status: Never Smoker  . Smokeless tobacco: Never Used  Substance and Sexual Activity  . Alcohol use: No    Alcohol/week: 0.0 oz  . Drug use: No  . Sexual activity: Never    Partners: Male    Birth control/protection: Post-menopausal  Lifestyle  . Physical activity:    Days per week: Not on file    Minutes per session: Not on file  . Stress: Not on file  Relationships  . Social connections:    Talks on phone: Not on file    Gets together: Not on file    Attends religious service: Not on file    Active member of club or organization: Not on file    Attends meetings of clubs or organizations: Not on file    Relationship status: Not on file  Other Topics Concern  . Not on file  Social History Narrative   3 kids, 8 GK   Lives w/ husband   Lost a g-child to suicide 2017       Outpatient Encounter Medications as of 05/25/2018    Medication Sig  . acetaminophen (TYLENOL) 500 MG tablet Take 500 mg by mouth every 6 (six) hours as needed. For pain  . aspirin 81 MG tablet Take 81 mg by mouth daily.    Marland Kitchen atorvastatin (LIPITOR) 40 MG tablet Take 1 tablet (40 mg total) by mouth at bedtime.  . calcium-vitamin D (OSCAL) 250-125 MG-UNIT per tablet Take 1 tablet by mouth.   . hydrochlorothiazide (HYDRODIURIL) 25 MG tablet Take 1 tablet (25 mg total) by mouth daily.  Marland Kitchen levothyroxine (SYNTHROID, LEVOTHROID) 112 MCG tablet Take 1 tablet (112 mcg total) by mouth daily before breakfast.  . omeprazole (PRILOSEC) 20 MG capsule Take 1 capsule (20 mg total) by mouth daily as needed.  . [DISCONTINUED] mometasone (ELOCON) 0.1 % ointment Apply topically daily.   No facility-administered encounter medications on file as of 05/25/2018.     Activities of Daily Living In your present state of health, do you have any difficulty performing the following activities: 05/25/2018  Hearing? N  Vision? N  Difficulty concentrating or making decisions? N  Walking or climbing stairs? N  Dressing or bathing? N  Doing errands, shopping? N  Preparing Food and eating ? N  Using the Toilet? N  In the past six months, have you accidently leaked urine? N  Do you have problems with loss of bowel control? N  Managing your Medications? N  Managing your Finances? N  Housekeeping or managing your Housekeeping? N  Some recent data might be hidden    Patient Care Team: Colon Branch, MD as PCP - General Jerrell Belfast, MD as Consulting Physician (Otolaryngology) [name], Amy, MD as Consulting Physician (Dermatology)    Assessment:   This is a routine wellness examination for Angela Clay. Physical assessment deferred to PCP.  Exercise Activities and Dietary recommendations Current Exercise Habits: Home exercise routine, Type of exercise: strength training/weights;treadmill, Time (Minutes): 60, Frequency (Times/Week): 3, Weekly Exercise (Minutes/Week): 180,  Intensity: Mild, Exercise limited by: None identified   Diet (meal preparation, eat out, water intake, caffeinated beverages, dairy products, fruits and vegetables): in general, a "healthy" diet  , well balanced Breakfast:fruit, boiled egg, toast, coffee, v8 juice Lunch: pb crackers and apple Dinner:  Chicken, boiled potatoes and green peas.  Goals    . maintain healthy active lifestyle (pt-stated)       Fall Risk Fall Risk  05/25/2018 05/11/2017 07/07/2016 01/10/2016 07/03/2015  Falls in the past year? No No No No No    Depression Screen PHQ 2/9 Scores 05/25/2018 05/11/2017 07/07/2016 01/10/2016  PHQ - 2 Score 0 0 0 0     Cognitive Function MMSE - Mini Mental State Exam 05/11/2017  Orientation to time 5  Orientation to Place 5  Registration 3  Attention/ Calculation 5  Recall 2  Language- name 2 objects 2  Language- repeat 1  Language- follow 3 step command 2  Language- read & follow direction 1  Write a sentence 1  Copy design 1  Total score 28        Immunization History  Administered Date(s) Administered  . Influenza Split 10/01/2011  . Influenza Whole 08/25/2007, 09/08/2008, 08/03/2009, 08/20/2010  . Influenza, High Dose Seasonal PF 07/03/2015, 07/07/2016, 07/16/2017  . Influenza, Seasonal, Injecte, Preservative Fre 10/11/2012  . Influenza,inj,Quad PF,6+ Mos 10/18/2013, 09/19/2014  . Pneumococcal Conjugate-13 01/02/2014  . Pneumococcal Polysaccharide-23 10/27/2000, 08/25/2007, 01/15/2018  . Td 10/27/1997, 08/30/2008  . Zoster 03/04/2013    Screening Tests Health Maintenance  Topic Date Due  . INFLUENZA VACCINE  05/27/2018  . TETANUS/TDAP  08/30/2018  . DEXA SCAN  Completed  . PNA vac Low Risk Adult  Completed       Plan:    Please schedule your next medicare wellness visit with me in 1 yr.  Continue to eat heart healthy diet (full of fruits, vegetables, whole grains, lean protein, water--limit salt, fat, and sugar intake) and increase physical activity as  tolerated.  Continue doing brain stimulating activities (puzzles, reading, adult coloring books, staying active) to keep memory sharp.   Bring a copy of your living will and/or healthcare power of attorney to your next office visit.    I have personally reviewed and noted the following in the patient's chart:   . Medical and social history . Use of alcohol, tobacco or illicit drugs  . Current medications and supplements . Functional ability and status . Nutritional status . Physical activity . Advanced directives . List of other physicians . Hospitalizations, surgeries, and ER visits in previous 12 months . Vitals . Screenings to include cognitive, depression, and falls . Referrals and appointments  In addition, I have reviewed and discussed with patient certain preventive protocols, quality metrics, and best practice recommendations. A written personalized care plan for preventive services as well as general preventive health recommendations were provided to patient.     Naaman Plummer [place], [place]  05/25/2018  [name], MD

## 2018-05-21 DIAGNOSIS — H35373 Puckering of macula, bilateral: Secondary | ICD-10-CM | POA: Diagnosis not present

## 2018-05-25 ENCOUNTER — Encounter: Payer: Self-pay | Admitting: *Deleted

## 2018-05-25 ENCOUNTER — Ambulatory Visit (INDEPENDENT_AMBULATORY_CARE_PROVIDER_SITE_OTHER): Payer: PPO | Admitting: *Deleted

## 2018-05-25 VITALS — BP 120/64 | HR 80 | Ht 65.0 in | Wt 167.0 lb

## 2018-05-25 DIAGNOSIS — Z0000 Encounter for general adult medical examination without abnormal findings: Secondary | ICD-10-CM

## 2018-05-25 NOTE — Patient Instructions (Signed)
Please schedule your next medicare wellness visit with me in 1 yr.  Continue to eat heart healthy diet (full of fruits, vegetables, whole grains, lean protein, water--limit salt, fat, and sugar intake) and increase physical activity as tolerated.  Continue doing brain stimulating activities (puzzles, reading, adult coloring books, staying active) to keep memory sharp.   Bring a copy of your living will and/or healthcare power of attorney to your next office visit.   Angela Clay , Thank you for taking time to come for your Medicare Wellness Visit. I appreciate your ongoing commitment to your health goals. Please review the following plan we discussed and let me know if I can assist you in the future.   These are the goals we discussed: Goals    . maintain healthy active lifestyle (pt-stated)       This is a list of the screening recommended for you and due dates:  Health Maintenance  Topic Date Due  . Flu Shot  05/27/2018  . Tetanus Vaccine  08/30/2018  . DEXA scan (bone density measurement)  Completed  . Pneumonia vaccines  Completed    Health Maintenance for Postmenopausal Women Menopause is a normal process in which your reproductive ability comes to an end. This process happens gradually over a span of months to years, usually between the ages of 1 and 21. Menopause is complete when you have missed 12 consecutive menstrual periods. It is important to talk with your health care provider about some of the most common conditions that affect postmenopausal women, such as heart disease, cancer, and bone loss (osteoporosis). Adopting a healthy lifestyle and getting preventive care can help to promote your health and wellness. Those actions can also lower your chances of developing some of these common conditions. What should I know about menopause? During menopause, you may experience a number of symptoms, such as:  Moderate-to-severe hot flashes.  Night sweats.  Decrease in sex  drive.  Mood swings.  Headaches.  Tiredness.  Irritability.  Memory problems.  Insomnia.  Choosing to treat or not to treat menopausal changes is an individual decision that you make with your health care provider. What should I know about hormone replacement therapy and supplements? Hormone therapy products are effective for treating symptoms that are associated with menopause, such as hot flashes and night sweats. Hormone replacement carries certain risks, especially as you become older. If you are thinking about using estrogen or estrogen with progestin treatments, discuss the benefits and risks with your health care provider. What should I know about heart disease and stroke? Heart disease, heart attack, and stroke become more likely as you age. This may be due, in part, to the hormonal changes that your body experiences during menopause. These can affect how your body processes dietary fats, triglycerides, and cholesterol. Heart attack and stroke are both medical emergencies. There are many things that you can do to help prevent heart disease and stroke:  Have your blood pressure checked at least every 1-2 years. High blood pressure causes heart disease and increases the risk of stroke.  If you are 21-81 years old, ask your health care provider if you should take aspirin to prevent a heart attack or a stroke.  Do not use any tobacco products, including cigarettes, chewing tobacco, or electronic cigarettes. If you need help quitting, ask your health care provider.  It is important to eat a healthy diet and maintain a healthy weight. ? Be sure to include plenty of vegetables, fruits, low-fat dairy  products, and lean protein. ? Avoid eating foods that are high in solid fats, added sugars, or salt (sodium).  Get regular exercise. This is one of the most important things that you can do for your health. ? Try to exercise for at least 150 minutes each week. The type of exercise that  you do should increase your heart rate and make you sweat. This is known as moderate-intensity exercise. ? Try to do strengthening exercises at least twice each week. Do these in addition to the moderate-intensity exercise.  Know your numbers.Ask your health care provider to check your cholesterol and your blood glucose. Continue to have your blood tested as directed by your health care provider.  What should I know about cancer screening? There are several types of cancer. Take the following steps to reduce your risk and to catch any cancer development as early as possible. Breast Cancer  Practice breast self-awareness. ? This means understanding how your breasts normally appear and feel. ? It also means doing regular breast self-exams. Let your health care provider know about any changes, no matter how small.  If you are 61 or older, have a clinician do a breast exam (clinical breast exam or CBE) every year. Depending on your age, family history, and medical history, it may be recommended that you also have a yearly breast X-ray (mammogram).  If you have a family history of breast cancer, talk with your health care provider about genetic screening.  If you are at high risk for breast cancer, talk with your health care provider about having an MRI and a mammogram every year.  Breast cancer (BRCA) gene test is recommended for women who have family members with BRCA-related cancers. Results of the assessment will determine the need for genetic counseling and BRCA1 and for BRCA2 testing. BRCA-related cancers include these types: ? Breast. This occurs in males or females. ? Ovarian. ? Tubal. This may also be called fallopian tube cancer. ? Cancer of the abdominal or pelvic lining (peritoneal cancer). ? Prostate. ? Pancreatic.  Cervical, Uterine, and Ovarian Cancer Your health care provider may recommend that you be screened regularly for cancer of the pelvic organs. These include your  ovaries, uterus, and vagina. This screening involves a pelvic exam, which includes checking for microscopic changes to the surface of your cervix (Pap test).  For women ages 21-65, health care providers may recommend a pelvic exam and a Pap test every three years. For women ages 35-65, they may recommend the Pap test and pelvic exam, combined with testing for human papilloma virus (HPV), every five years. Some types of HPV increase your risk of cervical cancer. Testing for HPV may also be done on women of any age who have unclear Pap test results.  Other health care providers may not recommend any screening for nonpregnant women who are considered low risk for pelvic cancer and have no symptoms. Ask your health care provider if a screening pelvic exam is right for you.  If you have had past treatment for cervical cancer or a condition that could lead to cancer, you need Pap tests and screening for cancer for at least 20 years after your treatment. If Pap tests have been discontinued for you, your risk factors (such as having a new sexual partner) need to be reassessed to determine if you should start having screenings again. Some women have medical problems that increase the chance of getting cervical cancer. In these cases, your health care provider may recommend that  you have screening and Pap tests more often.  If you have a family history of uterine cancer or ovarian cancer, talk with your health care provider about genetic screening.  If you have vaginal bleeding after reaching menopause, tell your health care provider.  There are currently no reliable tests available to screen for ovarian cancer.  Lung Cancer Lung cancer screening is recommended for adults 64-21 years old who are at high risk for lung cancer because of a history of smoking. A yearly low-dose CT scan of the lungs is recommended if you:  Currently smoke.  Have a history of at least 30 pack-years of smoking and you currently  smoke or have quit within the past 15 years. A pack-year is smoking an average of one pack of cigarettes per day for one year.  Yearly screening should:  Continue until it has been 15 years since you quit.  Stop if you develop a health problem that would prevent you from having lung cancer treatment.  Colorectal Cancer  This type of cancer can be detected and can often be prevented.  Routine colorectal cancer screening usually begins at age 52 and continues through age 23.  If you have risk factors for colon cancer, your health care provider may recommend that you be screened at an earlier age.  If you have a family history of colorectal cancer, talk with your health care provider about genetic screening.  Your health care provider may also recommend using home test kits to check for hidden blood in your stool.  A small camera at the end of a tube can be used to examine your colon directly (sigmoidoscopy or colonoscopy). This is done to check for the earliest forms of colorectal cancer.  Direct examination of the colon should be repeated every 5-10 years until age 55. However, if early forms of precancerous polyps or small growths are found or if you have a family history or genetic risk for colorectal cancer, you may need to be screened more often.  Skin Cancer  Check your skin from head to toe regularly.  Monitor any moles. Be sure to tell your health care provider: ? About any new moles or changes in moles, especially if there is a change in a mole's shape or color. ? If you have a mole that is larger than the size of a pencil eraser.  If any of your family members has a history of skin cancer, especially at a young age, talk with your health care provider about genetic screening.  Always use sunscreen. Apply sunscreen liberally and repeatedly throughout the day.  Whenever you are outside, protect yourself by wearing long sleeves, pants, a wide-brimmed hat, and  sunglasses.  What should I know about osteoporosis? Osteoporosis is a condition in which bone destruction happens more quickly than new bone creation. After menopause, you may be at an increased risk for osteoporosis. To help prevent osteoporosis or the bone fractures that can happen because of osteoporosis, the following is recommended:  If you are 25-21 years old, get at least 1,000 mg of calcium and at least 600 mg of vitamin D per day.  If you are older than age 60 but younger than age 21, get at least 1,200 mg of calcium and at least 600 mg of vitamin D per day.  If you are older than age 65, get at least 1,200 mg of calcium and at least 800 mg of vitamin D per day.  Smoking and excessive alcohol intake increase  the risk of osteoporosis. Eat foods that are rich in calcium and vitamin D, and do weight-bearing exercises several times each week as directed by your health care provider. What should I know about how menopause affects my mental health? Depression may occur at any age, but it is more common as you become older. Common symptoms of depression include:  Low or sad mood.  Changes in sleep patterns.  Changes in appetite or eating patterns.  Feeling an overall lack of motivation or enjoyment of activities that you previously enjoyed.  Frequent crying spells.  Talk with your health care provider if you think that you are experiencing depression. What should I know about immunizations? It is important that you get and maintain your immunizations. These include:  Tetanus, diphtheria, and pertussis (Tdap) booster vaccine.  Influenza every year before the flu season begins.  Pneumonia vaccine.  Shingles vaccine.  Your health care provider may also recommend other immunizations. This information is not intended to replace advice given to you by your health care provider. Make sure you discuss any questions you have with your health care provider. Document Released:  12/05/2005 Document Revised: 05/02/2016 Document Reviewed: 07/17/2015 Elsevier Interactive Patient Education  2018 [employer].

## 2018-07-19 ENCOUNTER — Encounter: Payer: Self-pay | Admitting: Medical

## 2018-07-19 ENCOUNTER — Other Ambulatory Visit: Payer: Self-pay

## 2018-07-19 ENCOUNTER — Emergency Department (HOSPITAL_BASED_OUTPATIENT_CLINIC_OR_DEPARTMENT_OTHER): Payer: PPO

## 2018-07-19 ENCOUNTER — Observation Stay (HOSPITAL_BASED_OUTPATIENT_CLINIC_OR_DEPARTMENT_OTHER)
Admission: EM | Admit: 2018-07-19 | Discharge: 2018-07-21 | Disposition: A | Discharge status: Home / Self Care | Payer: PPO | Attending: Cardiology | Admitting: Cardiology

## 2018-07-19 ENCOUNTER — Ambulatory Visit (INDEPENDENT_AMBULATORY_CARE_PROVIDER_SITE_OTHER): Payer: PPO | Admitting: Medical

## 2018-07-19 ENCOUNTER — Encounter (HOSPITAL_BASED_OUTPATIENT_CLINIC_OR_DEPARTMENT_OTHER): Payer: Self-pay | Admitting: *Deleted

## 2018-07-19 ENCOUNTER — Other Ambulatory Visit (HOSPITAL_BASED_OUTPATIENT_CLINIC_OR_DEPARTMENT_OTHER): Payer: Self-pay

## 2018-07-19 VITALS — BP 123/64 | HR 97 | Temp 98.7°F | Resp 16 | Ht 65.0 in | Wt 166.2 lb

## 2018-07-19 DIAGNOSIS — Z79890 Hormone replacement therapy (postmenopausal): Secondary | ICD-10-CM | POA: Diagnosis not present

## 2018-07-19 DIAGNOSIS — K219 Gastro-esophageal reflux disease without esophagitis: Secondary | ICD-10-CM | POA: Diagnosis not present

## 2018-07-19 DIAGNOSIS — E785 Hyperlipidemia, unspecified: Secondary | ICD-10-CM | POA: Diagnosis not present

## 2018-07-19 DIAGNOSIS — Z9049 Acquired absence of other specified parts of digestive tract: Secondary | ICD-10-CM | POA: Diagnosis not present

## 2018-07-19 DIAGNOSIS — Z23 Encounter for immunization: Secondary | ICD-10-CM | POA: Insufficient documentation

## 2018-07-19 DIAGNOSIS — Z8249 Family history of ischemic heart disease and other diseases of the circulatory system: Secondary | ICD-10-CM | POA: Insufficient documentation

## 2018-07-19 DIAGNOSIS — Z9889 Other specified postprocedural states: Secondary | ICD-10-CM | POA: Insufficient documentation

## 2018-07-19 DIAGNOSIS — Z79899 Other long term (current) drug therapy: Secondary | ICD-10-CM | POA: Diagnosis not present

## 2018-07-19 DIAGNOSIS — M1990 Unspecified osteoarthritis, unspecified site: Secondary | ICD-10-CM | POA: Insufficient documentation

## 2018-07-19 DIAGNOSIS — R079 Chest pain, unspecified: Secondary | ICD-10-CM

## 2018-07-19 DIAGNOSIS — I700 Atherosclerosis of aorta: Secondary | ICD-10-CM | POA: Diagnosis not present

## 2018-07-19 DIAGNOSIS — M8580 Other specified disorders of bone density and structure, unspecified site: Secondary | ICD-10-CM | POA: Diagnosis not present

## 2018-07-19 DIAGNOSIS — I200 Unstable angina: Secondary | ICD-10-CM

## 2018-07-19 DIAGNOSIS — I10 Essential (primary) hypertension: Secondary | ICD-10-CM | POA: Insufficient documentation

## 2018-07-19 DIAGNOSIS — Z7982 Long term (current) use of aspirin: Secondary | ICD-10-CM | POA: Insufficient documentation

## 2018-07-19 DIAGNOSIS — I249 Acute ischemic heart disease, unspecified: Secondary | ICD-10-CM | POA: Diagnosis present

## 2018-07-19 DIAGNOSIS — E039 Hypothyroidism, unspecified: Secondary | ICD-10-CM | POA: Diagnosis not present

## 2018-07-19 DIAGNOSIS — Z8673 Personal history of transient ischemic attack (TIA), and cerebral infarction without residual deficits: Secondary | ICD-10-CM | POA: Insufficient documentation

## 2018-07-19 DIAGNOSIS — I25119 Atherosclerotic heart disease of native coronary artery with unspecified angina pectoris: Principal | ICD-10-CM | POA: Insufficient documentation

## 2018-07-19 DIAGNOSIS — J9811 Atelectasis: Secondary | ICD-10-CM | POA: Diagnosis not present

## 2018-07-19 DIAGNOSIS — I779 Disorder of arteries and arterioles, unspecified: Secondary | ICD-10-CM | POA: Diagnosis present

## 2018-07-19 DIAGNOSIS — Z885 Allergy status to narcotic agent status: Secondary | ICD-10-CM | POA: Insufficient documentation

## 2018-07-19 DIAGNOSIS — J8410 Pulmonary fibrosis, unspecified: Secondary | ICD-10-CM | POA: Diagnosis not present

## 2018-07-19 DIAGNOSIS — I739 Peripheral vascular disease, unspecified: Secondary | ICD-10-CM

## 2018-07-19 DIAGNOSIS — Z9851 Tubal ligation status: Secondary | ICD-10-CM | POA: Diagnosis not present

## 2018-07-19 LAB — BASIC METABOLIC PANEL
Anion gap: 10 (ref 5–15)
BUN: 19 mg/dL (ref 8–23)
CO2: 28 mmol/L (ref 22–32)
Calcium: 9.3 mg/dL (ref 8.9–10.3)
Chloride: 99 mmol/L (ref 98–111)
Creatinine, Ser: 0.87 mg/dL (ref 0.44–1.00)
GFR calc Af Amer: >60 mL/min (ref >60)
GFR calc non Af Amer: >60 mL/min (ref >60)
Glucose, Bld: 132 mg/dL — ABNORMAL HIGH (ref 70–99)
Potassium: 3.6 mmol/L (ref 3.5–5.1)
Sodium: 137 mmol/L (ref 135–145)

## 2018-07-19 LAB — CBC
HCT: 42.6 % (ref 36.0–46.0)
Hemoglobin: 14.4 g/dL (ref 12.0–15.0)
MCH: 29.8 pg (ref 26.0–34.0)
MCHC: 33.8 g/dL (ref 30.0–36.0)
MCV: 88 fL (ref 78.0–100.0)
Platelets: 197 10*3/uL (ref 150–400)
RBC: 4.84 MIL/uL (ref 3.87–5.11)
RDW: 14.8 % (ref 11.5–15.5)
WBC: 10.3 10*3/uL (ref 4.0–10.5)

## 2018-07-19 LAB — TROPONIN I: Troponin I: <0.03 ng/mL (ref <0.03)

## 2018-07-19 MED ORDER — ATORVASTATIN CALCIUM 80 MG PO TABS
80.0000 mg | ORAL_TABLET | Freq: Every day | ORAL | Status: DC
  Administered 2018-07-19: 80 mg via ORAL
  Filled 2018-07-19: qty 1

## 2018-07-19 MED ORDER — ASPIRIN 81 MG PO CHEW
324.0000 mg | CHEWABLE_TABLET | Freq: Once | ORAL | Status: AC
  Administered 2018-07-19: 324 mg via ORAL
  Filled 2018-07-19: qty 4

## 2018-07-19 MED ORDER — HEPARIN (PORCINE) IN NACL 100-0.45 UNIT/ML-% IJ SOLN
850.0000 [IU]/h | INTRAMUSCULAR | Status: DC
  Administered 2018-07-19: 850 [IU]/h via INTRAVENOUS
  Filled 2018-07-19: qty 250

## 2018-07-19 MED ORDER — NITROGLYCERIN 0.4 MG SL SUBL
0.4000 mg | SUBLINGUAL_TABLET | SUBLINGUAL | Status: DC | PRN
  Filled 2018-07-19 (×2): qty 1

## 2018-07-19 MED ORDER — ASPIRIN 81 MG PO CHEW
81.0000 mg | CHEWABLE_TABLET | Freq: Every day | ORAL | Status: DC
  Administered 2018-07-21: 81 mg via ORAL
  Filled 2018-07-19 (×2): qty 1

## 2018-07-19 MED ORDER — ONDANSETRON HCL 4 MG/2ML IJ SOLN: 4.0000 mg | Freq: Four times a day (QID) | INTRAMUSCULAR | Status: DC | PRN

## 2018-07-19 MED ORDER — ASPIRIN EC 81 MG PO TBEC: 81.0000 mg | DELAYED_RELEASE_TABLET | Freq: Every day | ORAL | Status: DC

## 2018-07-19 MED ORDER — HEPARIN BOLUS VIA INFUSION
4000.0000 [IU] | Freq: Once | INTRAVENOUS | Status: AC
  Administered 2018-07-19: 4000 [IU] via INTRAVENOUS

## 2018-07-19 MED ORDER — NITROGLYCERIN 0.4 MG SL SUBL
0.4000 mg | SUBLINGUAL_TABLET | SUBLINGUAL | Status: DC | PRN
  Administered 2018-07-19: 0.4 mg via SUBLINGUAL

## 2018-07-19 MED ORDER — METOPROLOL TARTRATE 12.5 MG HALF TABLET
12.5000 mg | ORAL_TABLET | Freq: Two times a day (BID) | ORAL | Status: DC
  Administered 2018-07-19 – 2018-07-21 (×3): 12.5 mg via ORAL
  Filled 2018-07-19 (×3): qty 1

## 2018-07-19 MED ORDER — ACETAMINOPHEN 325 MG PO TABS
650.0000 mg | ORAL_TABLET | ORAL | Status: DC | PRN
  Administered 2018-07-20 – 2018-07-21 (×2): 650 mg via ORAL
  Filled 2018-07-19 (×2): qty 2

## 2018-07-19 MED ORDER — INFLUENZA VAC SPLIT HIGH-DOSE 0.5 ML IM SUSY
0.5000 mL | PREFILLED_SYRINGE | INTRAMUSCULAR | Status: AC
  Administered 2018-07-21: 0.5 mL via INTRAMUSCULAR
  Filled 2018-07-19: qty 0.5

## 2018-07-19 MED ORDER — LEVOTHYROXINE SODIUM 112 MCG PO TABS
112.0000 ug | ORAL_TABLET | Freq: Every day | ORAL | Status: DC
  Administered 2018-07-21: 112 ug via ORAL
  Filled 2018-07-19: qty 1

## 2018-07-19 NOTE — Patient Instructions (Signed)
You have had some constant low level chest tightness/discomfort since last night.  Last night the level of discomfort was 2 out of 10 and a presently you report 1 out of 10 discomfort.  You do have risk factors of age family history of MI, history of high cholesterol and history of hypertension    The inferior leads appear to have some ST elevation.  With slight chest discomfort under this scenario I do want you to be evaluated in the emergency department.  Set of troponin protein studies would be beneficial.  I have notified the emergency department ED recent clinical presentation and notified him we are sending you down.  For follow-up date to be determined after ED evaluation.

## 2018-07-19 NOTE — Progress Notes (Signed)
Subjective:    Patient ID: Angela Clay, female    DOB: [birth year], 84 y.o.   MRN: [id number]  HPI  Pt in states last night she she got home and got some slight tightness to her chest last night. She states slight catch to her chest wall last night. Today she had some slight tightness today but not as bad as last night.   Pt states last night discomfort. It never went away completley. Today mild discomfort level 1/10 discomfort.   Stressed last night and today as well. Helping her sisters.  Pt thinks slight pain on deep inspiration. But pain is not reproducible. And pain constant even without deep inspiration on discussion.  Pt last cholesterol labs looked good on review but on statin. Pt dad had MI at age 18. One sister had aortic aneurysm rupture.   Second hand smoke exposure through her husband.     Review of Systems  Constitutional: Negative for chills, fatigue and fever.  Respiratory: Negative for cough, chest tightness, shortness of breath and wheezing.   Cardiovascular: Positive for chest pain. Negative for palpitations and leg swelling.  Gastrointestinal: Negative for abdominal pain.  Musculoskeletal: Negative for back pain.  Skin: Negative for rash.  Neurological: Negative for dizziness.  Hematological: Negative for adenopathy. Does not bruise/bleed easily.  Psychiatric/Behavioral: Negative for behavioral problems and confusion. The patient is not nervous/anxious.        Describes recent stress.   Past Medical History:  Diagnosis Date  . Actinic keratosis 05/2015   Right mid central dorsal hand  . GERD (gastroesophageal reflux disease)    EGD for dysphagia 2000 aprox (-)  . History of radiation therapy   . Hyperlipidemia   . Hypertension   . Hypothyroidism   . Lymphoma ([place])    neck,mlig,unspecified site dx 1970s  . OA (osteoarthritis)    severe at neck  . Osteopenia   . Syncope 11-2012   admited . w/u (-)     Social History   Socioeconomic History    . Marital status: Married    Spouse name: Iona Beard  . Number of children: 3  . Years of education: Not on file  . Highest education level: Not on file  Occupational History  . Occupation: retired  Scientific laboratory technician  . Financial resource strain: Not on file  . Food insecurity:    Worry: Not on file    Inability: Not on file  . Transportation needs:    Medical: Not on file    Non-medical: Not on file  Tobacco Use  . Smoking status: Never Smoker  . Smokeless tobacco: Never Used  Substance and Sexual Activity  . Alcohol use: No    Alcohol/week: 0.0 standard drinks  . Drug use: No  . Sexual activity: Never    Partners: Male    Birth control/protection: Post-menopausal  Lifestyle  . Physical activity:    Days per week: Not on file    Minutes per session: Not on file  . Stress: Not on file  Relationships  . Social connections:    Talks on phone: Not on file    Gets together: Not on file    Attends religious service: Not on file    Active member of club or organization: Not on file    Attends meetings of clubs or organizations: Not on file    Relationship status: Not on file  . Intimate partner violence:    Fear of current or ex partner: Not on file  Emotionally abused: Not on file    Physically abused: Not on file    Forced sexual activity: Not on file  Other Topics Concern  . Not on file  Social History Narrative   3 kids, 8 GK   Lives w/ husband   Lost a g-child to suicide 2017       Past Surgical History:  Procedure Laterality Date  . APPENDECTOMY    . BELPHAROPTOSIS REPAIR Bilateral 2017  . CHOLECYSTECTOMY  02/24/06  . THYROIDECTOMY    . TONSILLECTOMY AND ADENOIDECTOMY  1949  . TUBAL LIGATION  1972    Family History  Problem Relation Age of Onset  . Stroke Mother 5  . Coronary artery disease Father        MI age 21  . Deep vein thrombosis Brother        PE at age 49  . Heart failure Sister   . Ovarian cancer Sister   . Diabetes Neg Hx   . Colon cancer  Neg Hx   . Breast cancer Neg Hx     Allergies  Allergen Reactions  . Codeine Nausea And Vomiting    No current facility-administered medications on file prior to visit.    Current Outpatient Medications on File Prior to Visit  Medication Sig Dispense Refill  . acetaminophen (TYLENOL) 500 MG tablet Take 500 mg by mouth every 6 (six) hours as needed. For pain    . aspirin 81 MG tablet Take 81 mg by mouth daily.      Marland Kitchen atorvastatin (LIPITOR) 40 MG tablet Take 1 tablet (40 mg total) by mouth at bedtime. 90 tablet 3  . calcium-vitamin D (OSCAL) 250-125 MG-UNIT per tablet Take 1 tablet by mouth.     . hydrochlorothiazide (HYDRODIURIL) 25 MG tablet Take 1 tablet (25 mg total) by mouth daily. 90 tablet 1  . levothyroxine (SYNTHROID, LEVOTHROID) 112 MCG tablet Take 1 tablet (112 mcg total) by mouth daily before breakfast. 90 tablet 3  . omeprazole (PRILOSEC) 20 MG capsule Take 1 capsule (20 mg total) by mouth daily as needed. 90 capsule 3    BP 123/64   Pulse 97   Temp 98.7 F (37.1 C) (Oral)   Resp 16   Ht 5' 5"  (1.651 m)   Wt 166 lb 3.2 oz (75.4 kg)   SpO2 97%   BMI 27.66 kg/m       Objective:   Physical Exam  General- No acute distress. Pleasant patient. Neck- Full range of motion, no jvd Lungs- Clear, even and unlabored. Heart- regular rate and rhythm. Neurologic- CNII- XII grossly intact. Anterior chest- no pain on palpation of costochondral junction.      Assessment & Plan:  You have had some constant low level chest tightness/discomfort since last night.  Last night the level of discomfort was 2 out of 10 and a presently you report 1 out of 10 discomfort.  You do have risk factors of age family history of MI, history of high cholesterol and history of hypertension    The inferior leads appear to have some ST elevation.  With slight chest discomfort under this scenario I do want you to be evaluated in the emergency department.  Set of troponin protein studies would be  beneficial.  I have notified the emergency department ED recent clinical presentation and notified him we are sending you down.  For follow-up date to be determined after ED evaluation.  Mackie Pai, PA-C

## 2018-07-19 NOTE — ED Provider Notes (Signed)
[facility] EMERGENCY DEPARTMENT Provider Note   CSN: [id number] Arrival date & time: 07/19/18  1647     History   Chief Complaint Chief Complaint  Patient presents with  . Chest Pain    HPI Angela Clay is a 84 y.o. female.  She is referred here from her primary care doctor's office for chest pain.  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  She started experiencing some mild substernal chest pressure around 9 PM last night that worsened this morning.  She said currently is a 2 out of 10 but its been as high as 5 out of 10 pressure in nature.  It was associated with some fatigue.  No shortness of breath no diaphoresis no fevers no chills no radiation.  She is taken nothing for it.  She is never had anything like this before.  She has no prior cardiac history.  The history is provided by the patient.  Chest Pain   This is a new problem. The current episode started yesterday. The problem occurs constantly. The problem has been gradually improving. The pain is present in the substernal region. The pain is at a severity of 5/10. The quality of the pain is described as pressure-like. The pain does not radiate. Associated symptoms include malaise/fatigue. Pertinent negatives include no abdominal pain, no back pain, no cough, no diaphoresis, no fever, no headaches, no leg pain, no nausea, no near-syncope, no palpitations, no shortness of breath and no vomiting. She has tried antacids and rest for the symptoms. The treatment provided mild relief. Risk factors include being elderly.  Her past medical history is significant for hyperlipidemia and hypertension.  Pertinent negatives for past medical history include no seizures.    Past Medical History:  Diagnosis Date  . Actinic keratosis 05/2015   Right mid central dorsal hand  . GERD (gastroesophageal reflux disease)    EGD for dysphagia 2000 aprox (-)  . History of  radiation therapy   . Hyperlipidemia   . Hypertension   . Hypothyroidism   . Lymphoma ([place])    neck,mlig,unspecified site dx 1970s  . OA (osteoarthritis)    severe at neck  . Osteopenia   . Syncope 11-2012   admited . w/u (-)    Patient Active Problem List   Diagnosis Date Noted  . Shingles 12/04/2015  . Follow-up-----------------PCP notes  07/03/2015  . Prolapse of female pelvic organs 03/11/2013  . Dystrophy, vulva-- s/p bx, on clobetasol prn 03/11/2013  . Monoarthritis of hand 03/10/2013  . Hyperglycemia 12/07/2012  . Annual physical exam 02/19/2011  . Carotid arterial disease ([place]) 02/19/2011  . DEGENERATIVE JOINT DISEASE 04/26/2010  . Hypothyroidism 01/11/2008  . LYMPHOMA NEC, MLIG, UNSPECIFIED SITE 08/25/2007  . Hyperlipidemia 08/25/2007  . Essential hypertension 08/25/2007  . GERD 08/25/2007  . Osteopenia 08/25/2007  . THYROIDECTOMY, HX OF 08/25/2007    Past Surgical History:  Procedure Laterality Date  . APPENDECTOMY    . BELPHAROPTOSIS REPAIR Bilateral 2017  . CHOLECYSTECTOMY  02/24/06  . THYROIDECTOMY    . TONSILLECTOMY AND ADENOIDECTOMY  1949  . TUBAL LIGATION  1972     OB History    Gravida  4   Para  3   Term  3   Preterm      AB  1   Living  3     SAB  1   TAB  Ectopic      Multiple      Live Births  3            Home Medications    Prior to Admission medications   Medication Sig Start Date End Date Taking? Authorizing Provider  acetaminophen (TYLENOL) 500 MG tablet Take 500 mg by mouth every 6 (six) hours as needed. For pain    [provider]  aspirin 81 MG tablet Take 81 mg by mouth daily.      [provider]  atorvastatin (LIPITOR) 40 MG tablet Take 1 tablet (40 mg total) by mouth at bedtime. 03/23/18   Colon Branch, MD  calcium-vitamin D (OSCAL) 250-125 MG-UNIT per tablet Take 1 tablet by mouth.     [provider]  hydrochlorothiazide (HYDRODIURIL) 25 MG tablet Take 1 tablet (25 mg total)  by mouth daily. 05/10/18   Colon Branch, MD  levothyroxine (SYNTHROID, LEVOTHROID) 112 MCG tablet Take 1 tablet (112 mcg total) by mouth daily before breakfast. 03/23/18   Colon Branch, MD  omeprazole (PRILOSEC) 20 MG capsule Take 1 capsule (20 mg total) by mouth daily as needed. 01/27/17   Colon Branch, MD    Family History Family History  Problem Relation Age of Onset  . Stroke Mother 38  . Coronary artery disease Father        MI age 47  . Deep vein thrombosis Brother        PE at age 28  . Heart failure Sister   . Ovarian cancer Sister   . Diabetes Neg Hx   . Colon cancer Neg Hx   . Breast cancer Neg Hx     Social History Social History   Tobacco Use  . Smoking status: Never Smoker  . Smokeless tobacco: Never Used  Substance Use Topics  . Alcohol use: No    Alcohol/week: 0.0 standard drinks  . Drug use: No     Allergies   Codeine   Review of Systems Review of Systems  Constitutional: Positive for malaise/fatigue. Negative for chills, diaphoresis and fever.  HENT: Negative for ear pain and sore throat.   Eyes: Negative for pain and visual disturbance.  Respiratory: Negative for cough and shortness of breath.   Cardiovascular: Positive for chest pain. Negative for palpitations and near-syncope.  Gastrointestinal: Negative for abdominal pain, nausea and vomiting.  Genitourinary: Negative for dysuria and hematuria.  Musculoskeletal: Negative for arthralgias and back pain.  Skin: Negative for color change and rash.  Neurological: Negative for seizures, syncope and headaches.  All other systems reviewed and are negative.    Physical Exam Updated Vital Signs BP (!) 154/75   Pulse 95   Temp 98.2 F (36.8 C) (Oral)   Resp 16   Ht 5' 5"  (1.651 m)   Wt 75.3 kg   SpO2 100%   BMI 27.62 kg/m   Physical Exam  Constitutional: She appears well-developed and well-nourished. No distress.  HENT:  Head: Normocephalic and atraumatic.  Eyes: Conjunctivae are normal.    Neck: Neck supple.  Cardiovascular: Normal rate, regular rhythm and normal pulses.  No murmur heard. Pulmonary/Chest: Effort normal and breath sounds normal. No respiratory distress.  Abdominal: Soft. There is no tenderness.  Musculoskeletal: She exhibits no edema.       Right lower leg: She exhibits no tenderness and no edema.       Left lower leg: She exhibits no tenderness and no edema.  Neurological: She is alert.  Skin: Skin is warm and dry. Capillary refill takes less than 2 seconds.  Psychiatric: She has a normal mood and affect.  Nursing note and vitals reviewed.    ED Treatments / Results  Labs (all labs ordered are listed, but only abnormal results are displayed) Labs Reviewed  BASIC METABOLIC PANEL - Abnormal; Notable for the following components:      Result Value   Glucose, Bld 132 (*)    All other components within normal limits  MRSA PCR SCREENING  CBC  TROPONIN I  HEPARIN LEVEL (UNFRACTIONATED)  CBC  BASIC METABOLIC PANEL  TROPONIN I  TROPONIN I  TROPONIN I  PROTIME-INR    EKG EKG Interpretation  Date/Time:  Monday July 19 2018 16:54:00 EDT Ventricular Rate:  94 PR Interval:  148 QRS Duration: 78 QT Interval:  338 QTC Calculation: 422 R Axis:   43 Text Interpretation:  Normal sinus rhythm Normal ECG similar to prior 2/14 Confirmed by Aletta Edouard [phone]) on 07/19/2018 5:27:07 PM   Radiology Dg Chest 2 View  Result Date: 07/19/2018 CLINICAL DATA:  Sternal chest pain onset last evening. EXAM: CHEST - 2 VIEW COMPARISON:  None. FINDINGS: The heart size and mediastinal contours are within normal limits. Tortuous atherosclerotic aorta without aneurysm. Minimal left basilar atelectasis. Calcified rounded nodular density in the left mid lung compatible with a granuloma. Both lungs are clear. Degenerative changes are present along the dorsal spine. The patient is status post cholecystectomy. IMPRESSION: Aortic atherosclerosis. Minimal left basilar  atelectasis. Small granuloma in the left upper lobe or lingula. Electronically Signed   By: Ashley Royalty M.D.   On: 07/19/2018 17:49    Procedures .Critical Care Performed by: Hayden Rasmussen, MD Authorized by: Hayden Rasmussen, MD   Critical care provider statement:    Critical care time (minutes):  45   Critical care time was exclusive of:  Separately billable procedures and treating other patients   Critical care was necessary to treat or prevent imminent or life-threatening deterioration of the following conditions:  Cardiac failure   Critical care was time spent personally by me on the following activities:  Discussions with consultants, evaluation of patient's response to treatment, examination of patient, ordering and performing treatments and interventions, ordering and review of laboratory studies, ordering and review of radiographic studies, pulse oximetry, re-evaluation of patient's condition, obtaining history from patient or surrogate, review of old charts and development of treatment plan with patient or surrogate   I assumed direction of critical care for this patient from another provider in my specialty: no     (including critical care time)  Medications Ordered in ED Medications  nitroGLYCERIN (NITROSTAT) SL tablet 0.4 mg (has no administration in time range)  heparin ADULT infusion 100 units/mL (25000 units/210m sodium chloride 0.45%) (850 Units/hr Intravenous Transfusing/Transfer 07/19/18 1933)  Influenza vac split quadrivalent PF (FLUZONE HIGH-DOSE) injection 0.5 mL (has no administration in time range)  aspirin chewable tablet 81 mg (has no administration in time range)  atorvastatin (LIPITOR) tablet 80 mg (80 mg Oral Given 07/19/18 2228)  levothyroxine (SYNTHROID, LEVOTHROID) tablet 112 mcg (has no administration in time range)  nitroGLYCERIN (NITROSTAT) SL tablet 0.4 mg (0.4 mg Sublingual Given 07/19/18 2228)  ondansetron (ZOFRAN) injection 4 mg (has no  administration in time range)  metoprolol tartrate (LOPRESSOR) tablet 12.5 mg (12.5 mg Oral Given 07/19/18 2228)  acetaminophen (TYLENOL) tablet 650 mg (has no administration in time range)  aspirin chewable tablet 324 mg (324 mg Oral Given 07/19/18  1741)  heparin bolus via infusion 4,000 Units (4,000 Units Intravenous Bolus from Bag 07/19/18 1933)     Initial Impression / Assessment and Plan / ED Course  I have reviewed the triage vital signs and the nursing notes.  Pertinent labs & imaging results that were available during my care of the patient were reviewed by me and considered in my medical decision making (see chart for details).  Clinical Course as of Jul 19 2321  Mon Jul 19, 2018  1820 Patient having a little more chest discomfort here.  We have repeated her EKG still is showing this slight ST elevations in the inferior leads with no reciprocal changes.   [MB]  1610 Patient's heart score would be at least 5 right now.   [MB]  [place] cardiology Dr. Radford Pax.  She recommended that we reviewed the EKGs with interventional and [facility] is helping with that.  She says if interventional is not going to activate on her than she would accept the patient to the stepdown unit at [facility] under Dr. Marlou Porch.   [MB]  1923 Discussed with Dr. Tamala Julian from interventional.  He does not see any indications for emergent intervention tonight.  I reconnected with Dr. Radford Pax and she is excepting the patient to [facility] stepdown unit.  She recommends heparin.  I have updated the patient on the plan and she is agreeable.   [MB]    Clinical Course User Index [MB] Hayden Rasmussen, MD     Final Clinical Impressions(s) / ED Diagnoses   Final diagnoses:  Unstable angina [facility])    ED Discharge Orders    None       Hayden Rasmussen, MD 07/19/18 [phone]

## 2018-07-19 NOTE — Progress Notes (Signed)
ANTICOAGULATION CONSULT NOTE - Initial Consult  Pharmacy Consult for heparin Indication: chest pain/ACS  Allergies  Allergen Reactions  . Codeine Nausea And Vomiting    Patient Measurements: Height: 5' 5"  (165.1 cm) Weight: 166 lb 0.1 oz (75.3 kg) IBW/kg (Calculated) : 57 Heparin Dosing Weight: 72.5  Vital Signs: Temp: 98.2 F (36.8 C) (09/23 1652) Temp Source: Oral (09/23 1652) BP: 121/66 (09/23 1830) Pulse Rate: 90 (09/23 1830)  Labs: Recent Labs    07/19/18 1711  HGB 14.4  HCT 42.6  PLT 197  CREATININE 0.87  TROPONINI <0.03    Estimated Creatinine Clearance: 50.6 mL/min (by C-G formula based on SCr of 0.87 mg/dL).   Medical History: Past Medical History:  Diagnosis Date  . Actinic keratosis 05/2015   Right mid central dorsal hand  . GERD (gastroesophageal reflux disease)    EGD for dysphagia 2000 aprox (-)  . History of radiation therapy   . Hyperlipidemia   . Hypertension   . Hypothyroidism   . Lymphoma ([place])    neck,mlig,unspecified site dx 1970s  . OA (osteoarthritis)    severe at neck  . Osteopenia   . Syncope 11-2012   admited . w/u (-)    Medications:   Assessment: 84 yo F presented to [facility] with chest pain. Pharmacy consulted to start heparin for ACS. CBC WNL  Goal of Therapy:  Heparin level 0.3-0.7 units/ml Monitor platelets by anticoagulation protocol: Yes   Plan:  Heparin 4000 unit IV bolus, then 850 units/hr Iv infusion 8 hr heparin level Daily heparin level, CBC, monitor for s/s bleeding  Harrietta Guardian, PharmD PGY1 Pharmacy Resident 07/19/2018    7:24 PM

## 2018-07-19 NOTE — ED Notes (Addendum)
Pt reported chest pressure, ready to medicate with Nitrostat , yet pt refused and stated the chest pain decreased wnd will let RN know if needed. Alert and oriented x 4. Pt on cardiac monitor, will continue to monitor.

## 2018-07-19 NOTE — ED Notes (Signed)
ED Provider at bedside. 

## 2018-07-19 NOTE — ED Triage Notes (Signed)
Chest pain since yesterday. She was seen by her MD today and was told to come here for evaluation of ST elevation that was seen on her EKG.

## 2018-07-19 NOTE — H&P (Signed)
Cardiology History & Physical    Patient ID: Angela Clay MRN: [id number], DOB: 1934-10-06 Date of Encounter: 07/19/2018, 9:54 PM Primary Physician: Colon Branch, MD  Chief Complaint: Chest Pain   HPI: Angela Clay is a 84 y.o. female with history of HLD, HTN, hypothyroid who presents with unstable angina.   She started having chest pressure yesterday that resolved. She attributed that to GI system, however symptoms returned today and she presented to [facility] ED. Trop i<0.03. She had ST elevations not meeting criteria for STEMI in inferior leads. She also had spontaneous resolution of chest pain, which she doesn't have when I saw her. She had no other symptoms on presentation including dyspea, chest pain, palpitations etc.   Past Medical History:  Diagnosis Date  . Actinic keratosis 05/2015   Right mid central dorsal hand  . GERD (gastroesophageal reflux disease)    EGD for dysphagia 2000 aprox (-)  . History of radiation therapy   . Hyperlipidemia   . Hypertension   . Hypothyroidism   . Lymphoma ([place])    neck,mlig,unspecified site dx 1970s  . OA (osteoarthritis)    severe at neck  . Osteopenia   . Syncope 11-2012   admited . w/u (-)     Surgical History:  Past Surgical History:  Procedure Laterality Date  . APPENDECTOMY    . BELPHAROPTOSIS REPAIR Bilateral 2017  . CHOLECYSTECTOMY  02/24/06  . THYROIDECTOMY    . TONSILLECTOMY AND ADENOIDECTOMY  1949  . TUBAL LIGATION  1972     Home Meds: Prior to Admission medications   Medication Sig Start Date End Date Taking? Authorizing Provider  acetaminophen (TYLENOL) 500 MG tablet Take 500 mg by mouth every 6 (six) hours as needed. For pain    [provider]  aspirin 81 MG tablet Take 81 mg by mouth daily.      [provider]  atorvastatin (LIPITOR) 40 MG tablet Take 1 tablet (40 mg total) by mouth at bedtime. 03/23/18   Colon Branch, MD  calcium-vitamin D (OSCAL) 250-125 MG-UNIT per tablet Take 1  tablet by mouth.     [provider]  hydrochlorothiazide (HYDRODIURIL) 25 MG tablet Take 1 tablet (25 mg total) by mouth daily. 05/10/18   Colon Branch, MD  levothyroxine (SYNTHROID, LEVOTHROID) 112 MCG tablet Take 1 tablet (112 mcg total) by mouth daily before breakfast. 03/23/18   Colon Branch, MD  omeprazole (PRILOSEC) 20 MG capsule Take 1 capsule (20 mg total) by mouth daily as needed. 01/27/17   Colon Branch, MD    Allergies:  Allergies  Allergen Reactions  . Codeine Nausea And Vomiting    Social History   Socioeconomic History  . Marital status: Married    Spouse name: Iona Beard  . Number of children: 3  . Years of education: Not on file  . Highest education level: Not on file  Occupational History  . Occupation: retired  Scientific laboratory technician  . Financial resource strain: Not on file  . Food insecurity:    Worry: Not on file    Inability: Not on file  . Transportation needs:    Medical: Not on file    Non-medical: Not on file  Tobacco Use  . Smoking status: Never Smoker  . Smokeless tobacco: Never Used  Substance and Sexual Activity  . Alcohol use: No    Alcohol/week: 0.0 standard drinks  . Drug use: No  . Sexual activity: Never  Partners: Male    Birth control/protection: Post-menopausal  Lifestyle  . Physical activity:    Days per week: Not on file    Minutes per session: Not on file  . Stress: Not on file  Relationships  . Social connections:    Talks on phone: Not on file    Gets together: Not on file    Attends religious service: Not on file    Active member of club or organization: Not on file    Attends meetings of clubs or organizations: Not on file    Relationship status: Not on file  . Intimate partner violence:    Fear of current or ex partner: Not on file    Emotionally abused: Not on file    Physically abused: Not on file    Forced sexual activity: Not on file  Other Topics Concern  . Not on file  Social History Narrative   3 kids, 8 GK    Lives w/ husband   Lost a g-child to suicide 2017        Family History  Problem Relation Age of Onset  . Stroke Mother 25  . Coronary artery disease Father        MI age 79  . Deep vein thrombosis Brother        PE at age 60  . Heart failure Sister   . Ovarian cancer Sister   . Diabetes Neg Hx   . Colon cancer Neg Hx   . Breast cancer Neg Hx     Review of Systems: All other systems reviewed and are otherwise negative except as noted above.  Labs:   Lab Results  Component Value Date   WBC 10.3 07/19/2018   HGB 14.4 07/19/2018   HCT 42.6 07/19/2018   MCV 88.0 07/19/2018   PLT 197 07/19/2018    Recent Labs  Lab 07/19/18 1711  NA 137  K 3.6  CL 99  CO2 28  BUN 19  CREATININE 0.87  CALCIUM 9.3  GLUCOSE 132*   Recent Labs    07/19/18 1711  TROPONINI <0.03   Lab Results  Component Value Date   CHOL 153 01/15/2018   HDL 56.60 01/15/2018   LDLCALC 70 01/15/2018   TRIG 135.0 01/15/2018   No results found for: DDIMER  Radiology/Studies:  Dg Chest 2 View  Result Date: 07/19/2018 CLINICAL DATA:  Sternal chest pain onset last evening. EXAM: CHEST - 2 VIEW COMPARISON:  None. FINDINGS: The heart size and mediastinal contours are within normal limits. Tortuous atherosclerotic aorta without aneurysm. Minimal left basilar atelectasis. Calcified rounded nodular density in the left mid lung compatible with a granuloma. Both lungs are clear. Degenerative changes are present along the dorsal spine. The patient is status post cholecystectomy. IMPRESSION: Aortic atherosclerosis. Minimal left basilar atelectasis. Small granuloma in the left upper lobe or lingula. Electronically Signed   By: Ashley Royalty M.D.   On: 07/19/2018 17:49   Wt Readings from Last 3 Encounters:  07/19/18 75.1 kg  07/19/18 75.4 kg  05/25/18 75.8 kg    EKG: 45m ST elevations in inferior leads   Physical Exam: Blood pressure (!) 105/58, pulse 96, temperature 98.1 F (36.7 C), temperature source  Oral, resp. rate (!) 30, height 5' 5"  (1.651 m), weight 75.1 kg, SpO2 99 %. Body mass index is 27.54 kg/m. General: Well developed, well nourished, in no acute distress. Head: Normocephalic, atraumatic, sclera non-icteric, no xanthomas, nares are without discharge.  Neck: Negative for carotid bruits.  JVD not elevated. Lungs: Clear bilaterally to auscultation without wheezes, rales, or rhonchi. Breathing is unlabored. Heart: RRR with S1 S2. No murmurs, rubs, or gallops appreciated. Abdomen: Soft, non-tender, non-distended with normoactive bowel sounds. No hepatomegaly. No rebound/guarding. No obvious abdominal masses. Msk:  Strength and tone appear normal for age. Extremities: No clubbing or cyanosis. No edema.  Distal pedal pulses are 2+ and equal bilaterally. Neuro: Alert and oriented X 3. No focal deficit. No facial asymmetry. Moves all extremities spontaneously. Psych:  Responds to questions appropriately with a normal affect.    Assessment and Plan  12F w/ unstable angina.   Diagnostics: -telemetry overnight -TTE in am - likely LHC in AM (NPO) -daily ECG -troponin q6  Therapeutic: -ASA 325 given, continuing 81 daily -Heparin gtt -Atorva 80 (increased from 40) -Metoprolol 12.5 BID -will await LHC results prior to initiation of P2Y12 -SL NTG prn chest pain -continue synthroid  Signed, Glynda Jaeger, MD 07/19/2018, 9:54 PM

## 2018-07-20 ENCOUNTER — Encounter (HOSPITAL_COMMUNITY)
Admission: EM | Disposition: A | Discharge status: Home / Self Care | Payer: Self-pay | Source: Home / Self Care | Attending: Emergency Medicine

## 2018-07-20 ENCOUNTER — Observation Stay (HOSPITAL_BASED_OUTPATIENT_CLINIC_OR_DEPARTMENT_OTHER): Payer: PPO

## 2018-07-20 ENCOUNTER — Encounter (HOSPITAL_COMMUNITY): Payer: Self-pay | Admitting: Interventional Cardiology

## 2018-07-20 DIAGNOSIS — I2510 Atherosclerotic heart disease of native coronary artery without angina pectoris: Secondary | ICD-10-CM

## 2018-07-20 DIAGNOSIS — I249 Acute ischemic heart disease, unspecified: Secondary | ICD-10-CM | POA: Diagnosis not present

## 2018-07-20 DIAGNOSIS — I200 Unstable angina: Secondary | ICD-10-CM | POA: Diagnosis not present

## 2018-07-20 DIAGNOSIS — E785 Hyperlipidemia, unspecified: Secondary | ICD-10-CM

## 2018-07-20 HISTORY — PX: LEFT HEART CATH AND CORONARY ANGIOGRAPHY: CATH118249

## 2018-07-20 LAB — BASIC METABOLIC PANEL
Anion gap: 10 (ref 5–15)
BUN: 12 mg/dL (ref 8–23)
CO2: 27 mmol/L (ref 22–32)
Calcium: 8.9 mg/dL (ref 8.9–10.3)
Chloride: 101 mmol/L (ref 98–111)
Creatinine, Ser: 0.85 mg/dL (ref 0.44–1.00)
GFR calc Af Amer: >60 mL/min (ref >60)
GFR calc non Af Amer: >60 mL/min (ref >60)
Glucose, Bld: 136 mg/dL — ABNORMAL HIGH (ref 70–99)
Potassium: 3.5 mmol/L (ref 3.5–5.1)
Sodium: 138 mmol/L (ref 135–145)

## 2018-07-20 LAB — MRSA PCR SCREENING: MRSA by PCR: NEGATIVE

## 2018-07-20 LAB — CREATININE, SERUM
Creatinine, Ser: 0.9 mg/dL (ref 0.44–1.00)
GFR calc Af Amer: >60 mL/min (ref >60)
GFR calc non Af Amer: 58 mL/min — ABNORMAL LOW (ref >60)

## 2018-07-20 LAB — HEPARIN LEVEL (UNFRACTIONATED)
Heparin Unfractionated: 0.44 IU/mL (ref 0.30–0.70)
Heparin Unfractionated: 0.42 IU/mL (ref 0.30–0.70)

## 2018-07-20 LAB — CBC
HCT: 38 % (ref 36.0–46.0)
HCT: 34.5 % — ABNORMAL LOW (ref 36.0–46.0)
Hemoglobin: 12.4 g/dL (ref 12.0–15.0)
Hemoglobin: 11 g/dL — ABNORMAL LOW (ref 12.0–15.0)
MCH: 28.9 pg (ref 26.0–34.0)
MCH: 28.9 pg (ref 26.0–34.0)
MCHC: 32.6 g/dL (ref 30.0–36.0)
MCHC: 31.9 g/dL (ref 30.0–36.0)
MCV: 88.6 fL (ref 78.0–100.0)
MCV: 90.6 fL (ref 78.0–100.0)
Platelets: 159 10*3/uL (ref 150–400)
Platelets: 139 10*3/uL — ABNORMAL LOW (ref 150–400)
RBC: 4.29 MIL/uL (ref 3.87–5.11)
RBC: 3.81 MIL/uL — ABNORMAL LOW (ref 3.87–5.11)
RDW: 14.4 % (ref 11.5–15.5)
RDW: 14.6 % (ref 11.5–15.5)
WBC: 9.4 10*3/uL (ref 4.0–10.5)
WBC: 7.2 10*3/uL (ref 4.0–10.5)

## 2018-07-20 LAB — ECHOCARDIOGRAM COMPLETE
Height: 65 in
Weight: 2631.41 oz

## 2018-07-20 LAB — POCT ACTIVATED CLOTTING TIME: Activated Clotting Time: 125 seconds

## 2018-07-20 LAB — PROTIME-INR
INR: 1.14
Prothrombin Time: 14.5 seconds (ref 11.4–15.2)

## 2018-07-20 LAB — TROPONIN I
Troponin I: <0.03 ng/mL (ref <0.03)
Troponin I: <0.03 ng/mL (ref <0.03)
Troponin I: <0.03 ng/mL (ref <0.03)

## 2018-07-20 LAB — C-REACTIVE PROTEIN: CRP: 18.9 mg/dL — ABNORMAL HIGH (ref <1.0)

## 2018-07-20 SURGERY — LEFT HEART CATH AND CORONARY ANGIOGRAPHY
Anesthesia: LOCAL

## 2018-07-20 MED ORDER — HEPARIN SODIUM (PORCINE) 1000 UNIT/ML IJ SOLN: INTRAMUSCULAR | Status: DC | PRN

## 2018-07-20 MED ORDER — SODIUM CHLORIDE 0.9 % IV SOLN: INTRAVENOUS | Status: AC

## 2018-07-20 MED ORDER — FENTANYL CITRATE (PF) 100 MCG/2ML IJ SOLN
INTRAMUSCULAR | Status: DC | PRN
  Administered 2018-07-20: 25 ug via INTRAVENOUS

## 2018-07-20 MED ORDER — SODIUM CHLORIDE 0.9% FLUSH: 3.0000 mL | Freq: Two times a day (BID) | INTRAVENOUS | Status: DC

## 2018-07-20 MED ORDER — SODIUM CHLORIDE 0.9% FLUSH: 3.0000 mL | INTRAVENOUS | Status: DC | PRN

## 2018-07-20 MED ORDER — MIDAZOLAM HCL 2 MG/2ML IJ SOLN
INTRAMUSCULAR | Status: DC | PRN
  Administered 2018-07-20: 1 mg via INTRAVENOUS

## 2018-07-20 MED ORDER — ASPIRIN 81 MG PO CHEW
81.0000 mg | CHEWABLE_TABLET | ORAL | Status: AC
  Administered 2018-07-20: 81 mg via ORAL

## 2018-07-20 MED ORDER — SODIUM CHLORIDE 0.9 % WEIGHT BASED INFUSION
3.0000 mL/kg/h | INTRAVENOUS | Status: DC
  Administered 2018-07-20: 3 mL/kg/h via INTRAVENOUS

## 2018-07-20 MED ORDER — MIDAZOLAM HCL 2 MG/2ML IJ SOLN
INTRAMUSCULAR | Status: AC
  Filled 2018-07-20: qty 2

## 2018-07-20 MED ORDER — SODIUM CHLORIDE 0.9 % IV SOLN: 250.0000 mL | INTRAVENOUS | Status: DC | PRN

## 2018-07-20 MED ORDER — FENTANYL CITRATE (PF) 100 MCG/2ML IJ SOLN
INTRAMUSCULAR | Status: AC
  Filled 2018-07-20: qty 2

## 2018-07-20 MED ORDER — IOHEXOL 350 MG/ML SOLN
INTRAVENOUS | Status: DC | PRN
  Administered 2018-07-20: 50 mL via INTRA_ARTERIAL

## 2018-07-20 MED ORDER — HEPARIN (PORCINE) IN NACL 1000-0.9 UT/500ML-% IV SOLN
INTRAVENOUS | Status: DC | PRN
  Administered 2018-07-20 (×2): 500 mL

## 2018-07-20 MED ORDER — LIDOCAINE HCL (PF) 1 % IJ SOLN
INTRAMUSCULAR | Status: AC
  Filled 2018-07-20: qty 30

## 2018-07-20 MED ORDER — HEPARIN SODIUM (PORCINE) 1000 UNIT/ML IJ SOLN
INTRAMUSCULAR | Status: AC
  Filled 2018-07-20: qty 1

## 2018-07-20 MED ORDER — HEPARIN (PORCINE) IN NACL 1000-0.9 UT/500ML-% IV SOLN
INTRAVENOUS | Status: AC
  Filled 2018-07-20: qty 1000

## 2018-07-20 MED ORDER — LIDOCAINE HCL (PF) 1 % IJ SOLN
INTRAMUSCULAR | Status: DC | PRN
  Administered 2018-07-20: 2 mL
  Administered 2018-07-20: 10 mL

## 2018-07-20 MED ORDER — VERAPAMIL HCL 2.5 MG/ML IV SOLN
INTRAVENOUS | Status: DC | PRN
  Administered 2018-07-20: 10 mL via INTRA_ARTERIAL

## 2018-07-20 MED ORDER — VERAPAMIL HCL 2.5 MG/ML IV SOLN
INTRAVENOUS | Status: AC
  Filled 2018-07-20: qty 2

## 2018-07-20 MED ORDER — SODIUM CHLORIDE 0.9% FLUSH
3.0000 mL | Freq: Two times a day (BID) | INTRAVENOUS | Status: DC
  Administered 2018-07-21: 13:00:00 via INTRAVENOUS

## 2018-07-20 MED ORDER — HEPARIN SODIUM (PORCINE) 5000 UNIT/ML IJ SOLN
5000.0000 [IU] | Freq: Three times a day (TID) | INTRAMUSCULAR | Status: DC
  Administered 2018-07-20 – 2018-07-21 (×2): 5000 [IU] via SUBCUTANEOUS
  Filled 2018-07-20 (×2): qty 1

## 2018-07-20 MED ORDER — ONDANSETRON HCL 4 MG/2ML IJ SOLN: 4.0000 mg | Freq: Four times a day (QID) | INTRAMUSCULAR | Status: DC | PRN

## 2018-07-20 MED ORDER — ACETAMINOPHEN 325 MG PO TABS: 650.0000 mg | ORAL_TABLET | ORAL | Status: DC | PRN

## 2018-07-20 MED ORDER — SODIUM CHLORIDE 0.9 % WEIGHT BASED INFUSION: 1.0000 mL/kg/h | INTRAVENOUS | Status: DC

## 2018-07-20 SURGICAL SUPPLY — 13 items
CATH 5FR JL3.5 JR4 ANG PIG MP (CATHETERS) ×1 IMPLANT
CATH INFINITI 5FR JL4 (CATHETERS) ×2 IMPLANT
DEVICE RAD COMP TR BAND LRG (VASCULAR PRODUCTS) ×1 IMPLANT
GLIDESHEATH SLEND A-KIT 6F 22G (SHEATH) ×1 IMPLANT
GUIDEWIRE INQWIRE 1.5J.035X260 (WIRE) IMPLANT
INQWIRE 1.5J .035X260CM (WIRE) ×2
KIT HEART LEFT (KITS) ×2 IMPLANT
PACK CARDIAC CATHETERIZATION (CUSTOM PROCEDURE TRAY) ×2 IMPLANT
SHEATH PINNACLE 5F 10CM (SHEATH) ×2 IMPLANT
TRANSDUCER W/STOPCOCK (MISCELLANEOUS) ×2 IMPLANT
TUBING CIL FLEX 10 FLL-RA (TUBING) ×2 IMPLANT
WIRE EMERALD 3MM-J .035X150CM (WIRE) ×1 IMPLANT
WIRE HI TORQ VERSACORE-J 145CM (WIRE) ×1 IMPLANT

## 2018-07-20 NOTE — Progress Notes (Signed)
[place] for heparin Indication: chest pain/ACS  Allergies  Allergen Reactions  . Codeine Nausea And Vomiting    Patient Measurements: Height: 5' 5"  (165.1 cm) Weight: 164 lb 7.4 oz (74.6 kg) IBW/kg (Calculated) : 57 Heparin Dosing Weight: 72.5  Vital Signs: Temp: 100.2 F (37.9 C) (09/24 0307) Temp Source: Oral (09/24 0307) BP: 95/42 (09/24 1350) Pulse Rate: 78 (09/24 1350)  Labs: Recent Labs    07/19/18 1711 07/19/18 2320 07/20/18 0427 07/20/18 1115  HGB 14.4  --  12.4  --   HCT 42.6  --  38.0  --   PLT 197  --  159  --   LABPROT  --   --  14.5  --   INR  --   --  1.14  --   HEPARINUNFRC  --   --  0.44 0.42  CREATININE 0.87  --  0.85  --   TROPONINI <0.03 <0.03 <0.03 <0.03    Estimated Creatinine Clearance: 51.6 mL/min (by C-G formula based on SCr of 0.85 mg/dL).   Medical History: Past Medical History:  Diagnosis Date  . Actinic keratosis 05/2015   Right mid central dorsal hand  . GERD (gastroesophageal reflux disease)    EGD for dysphagia 2000 aprox (-)  . History of radiation therapy   . Hyperlipidemia   . Hypertension   . Hypothyroidism   . Lymphoma ([place])    neck,mlig,unspecified site dx 1970s  . OA (osteoarthritis)    severe at neck  . Osteopenia   . Syncope 11-2012   admited . w/u (-)    Medications:   Assessment: 84 yo F presented to [facility] with chest pain. Pharmacy consulted to start heparin for ACS. CBC WNL  Heparin level came back therapeutic at 0.42, on 850 units/hr. Hgb 12.4, plt 159. No s/sx of bleeding.   Underwent cath showing possible pericarditis and unlikely chest discomfort related to myocardial ischemia- discontinue heparin post cath.   Doylene Canard, PharmD Clinical Pharmacist  Pager: [phone] Phone: [phone] 07/20/2018    2:03 PM

## 2018-07-20 NOTE — Progress Notes (Signed)
[place] for heparin Indication: chest pain/ACS  Allergies  Allergen Reactions  . Codeine Nausea And Vomiting    Patient Measurements: Height: 5' 5"  (165.1 cm) Weight: 164 lb 7.4 oz (74.6 kg) IBW/kg (Calculated) : 57 Heparin Dosing Weight: 72.5  Vital Signs: Temp: 100.2 F (37.9 C) (09/24 0307) Temp Source: Oral (09/24 0307) BP: 133/68 (09/24 0307) Pulse Rate: 83 (09/24 0307)  Labs: Recent Labs    07/19/18 1711 07/19/18 2320 07/20/18 0427  HGB 14.4  --  12.4  HCT 42.6  --  38.0  PLT 197  --  159  LABPROT  --   --  14.5  INR  --   --  1.14  HEPARINUNFRC  --   --  0.44  CREATININE 0.87  --   --   TROPONINI <0.03 <0.03  --     Estimated Creatinine Clearance: 50.4 mL/min (by C-G formula based on SCr of 0.87 mg/dL).   Medical History: Past Medical History:  Diagnosis Date  . Actinic keratosis 05/2015   Right mid central dorsal hand  . GERD (gastroesophageal reflux disease)    EGD for dysphagia 2000 aprox (-)  . History of radiation therapy   . Hyperlipidemia   . Hypertension   . Hypothyroidism   . Lymphoma ([place])    neck,mlig,unspecified site dx 1970s  . OA (osteoarthritis)    severe at neck  . Osteopenia   . Syncope 11-2012   admited . w/u (-)    Medications:   Assessment: 84 yo F presented to [facility] with chest pain. Pharmacy consulted to start heparin for ACS. CBC WNL Initial heparin level therapeutic  Goal of Therapy:  Heparin level 0.3-0.7 units/ml Monitor platelets by anticoagulation protocol: Yes   Plan:  Continue heparin drip at 850 units/hr 6 hr heparin level to confirm Daily heparin level, CBC, monitor for s/s bleeding  Excell Seltzer, PharmD  07/20/2018    5:20 AM

## 2018-07-20 NOTE — Research (Signed)
CADFEM Informed Consent   Subject Name: Angela Clay  Subject met inclusion and exclusion criteria.  The informed consent form, study requirements and expectations were reviewed with the subject and questions and concerns were addressed prior to the signing of the consent form.  The subject verbalized understanding of the trail requirements.  The subject agreed to participate in the CADFEM trial and signed the informed consent.  The informed consent was obtained prior to performance of any protocol-specific procedures for the subject.  A copy of the signed informed consent was given to the subject and a copy was placed in the subject's medical record.  [name] 07/20/2018, 9:35 AM

## 2018-07-20 NOTE — Progress Notes (Signed)
Site area: rt groin fa sheath and pressure held by Cherrie Gauze Site Prior to Removal:  Level 0 Pressure Applied For: 20 minutes Manual:   yes Patient Status During Pull:   stable Post Pull Site:  Level 0 Post Pull Instructions Given:  yes Post Pull Pulses Present: rt dp palpable Dressing Applied:  Gauze and tegaderm Bedrest begins @ 7092 Comments:

## 2018-07-20 NOTE — H&P (View-Only) (Signed)
Progress Note  Patient Name: Angela Clay Date of Encounter: 07/20/2018  Primary Cardiologist: New  Subjective   Recurrent chest pain last night resolved with SL nitro. No dyspnea.   Inpatient Medications    Scheduled Meds: . aspirin  81 mg Oral Daily  . atorvastatin  80 mg Oral QHS  . Influenza vac split quadrivalent PF  0.5 mL Intramuscular Tomorrow-1000  . levothyroxine  112 mcg Oral QAC breakfast  . metoprolol tartrate  12.5 mg Oral BID   Continuous Infusions: . heparin 850 Units/hr (07/20/18 0400)   PRN Meds: acetaminophen, nitroGLYCERIN, nitroGLYCERIN, ondansetron (ZOFRAN) IV   Vital Signs    Vitals:   07/19/18 2106 07/19/18 2227 07/19/18 2324 07/20/18 0307  BP: (!) 105/58 125/66 (!) 117/55 133/68  Pulse: 96 73 90 83  Resp: (!) 30 19 19  (!) 25  Temp: 98.1 F (36.7 C)  98.9 F (37.2 C) 100.2 F (37.9 C)  TempSrc: Oral  Oral Oral  SpO2: 99%  94% 96%  Weight: 75.1 kg   74.6 kg  Height:        Intake/Output Summary (Last 24 hours) at 07/20/2018 0649 Last data filed at 07/20/2018 0400 Gross per 24 hour  Intake 361.07 ml  Output -  Net 361.07 ml   Filed Weights   07/19/18 1651 07/19/18 2106 07/20/18 0307  Weight: 75.3 kg 75.1 kg 74.6 kg    Telemetry    SR in 80s- Personally Reviewed  ECG    None today   Physical Exam   GEN: No acute distress.   Neck: No JVD Cardiac: RRR, no murmurs, rubs, or gallops.  Respiratory: Clear to auscultation bilaterally. GI: Soft, nontender, non-distended  MS: No edema; No deformity. Neuro:  Nonfocal  Psych: Normal affect   Labs    Chemistry Recent Labs  Lab 07/19/18 1711 07/20/18 0427  NA 137 138  K 3.6 3.5  CL 99 101  CO2 28 27  GLUCOSE 132* 136*  BUN 19 12  CREATININE 0.87 0.85  CALCIUM 9.3 8.9  GFRNONAA >60 >60  GFRAA >60 >60  ANIONGAP 10 10     Hematology Recent Labs  Lab 07/19/18 1711 07/20/18 0427  WBC 10.3 9.4  RBC 4.84 4.29  HGB 14.4 12.4  HCT 42.6 38.0  MCV 88.0 88.6  MCH  29.8 28.9  MCHC 33.8 32.6  RDW 14.8 14.4  PLT 197 159    Cardiac Enzymes Recent Labs  Lab 07/19/18 1711 07/19/18 2320 07/20/18 0427  TROPONINI <0.03 <0.03 <0.03     Radiology    Dg Chest 2 View  Result Date: 07/19/2018 CLINICAL DATA:  Sternal chest pain onset last evening. EXAM: CHEST - 2 VIEW COMPARISON:  None. FINDINGS: The heart size and mediastinal contours are within normal limits. Tortuous atherosclerotic aorta without aneurysm. Minimal left basilar atelectasis. Calcified rounded nodular density in the left mid lung compatible with a granuloma. Both lungs are clear. Degenerative changes are present along the dorsal spine. The patient is status post cholecystectomy. IMPRESSION: Aortic atherosclerosis. Minimal left basilar atelectasis. Small granuloma in the left upper lobe or lingula. Electronically Signed   By: Ashley Royalty M.D.   On: 07/19/2018 17:49    Cardiac Studies   Pending cath today   Patient Profile     84 y.o. female with hx of HTN, HLD and hypothyroidism admitted with 2 days hx unstable angina.   Initially thought symptoms due to anxiety but continued to worsen.   Assessment & Plan  1. Unstable angina - Troponin negative x 3. EKG with non specific ST elevation in inferior lateral leads (more pronounced compared to EKG in 01/14/13). Last night had recurrent Chest pressure which resolved with SL nitro x1. Currently chest pain free.  - Continue ASA, statin, BB and IV heparin.   The patient understands that risks include but are not limited to stroke (1 in 1000), death (1 in 53), kidney failure [usually temporary] (1 in 500), bleeding (1 in 200), allergic reaction [possibly serious] (1 in 200), and agrees to proceed.   2. HLD - 01/15/2018: Cholesterol 153; HDL 56.60; LDL Cholesterol 70; Triglycerides 135.0; VLDL 27.0 - Lipitor increased to 80 during admission.   3. HTN - BP stable. Home HCTZ on hold. Started BB.     For questions or updates, please contact  [place] Please consult [email or web address] for contact info under       SignedLeanor Kail, PA  07/20/2018, 6:49 AM    Patient seen, examined. Available data reviewed. Agree with findings, assessment, and plan as outlined by Robbie Lis, PA-C.   The patient is independently interviewed and examined.  She is a very pleasant and functional elderly woman in no distress.  JVP is normal, lungs are clear, heart is regular rate and rhythm with no murmur gallop, abdomen is soft and nontender, extremities show no edema.  The patient is interviewed and her symptoms have some typical and atypical features for unstable angina pectoris.  Troponins are negative.  EKG demonstrates inferolateral ST elevation but is not diagnostic for STEMI.  Echo is currently pending. I agree that cardiac catheterization and possible PCI are indicated.  I have reviewed the risks, indications, and alternatives to cardiac catheterization, possible angioplasty, and stenting with the patient. Risks include but are not limited to bleeding, infection, vascular injury, stroke, myocardial infection, arrhythmia, kidney injury, radiation-related injury in the case of prolonged fluoroscopy use, emergency cardiac surgery, and death. The patient understands the risks of serious complication is 1-2 in 3338 with diagnostic cardiac cath and 1-2% or less with angioplasty/stenting.   Sherren Mocha, M.D. 07/20/2018 9:44 AM

## 2018-07-20 NOTE — Interval H&P Note (Signed)
Cath Lab Visit (complete for each Cath Lab visit)  Clinical Evaluation Leading to the Procedure:   ACS: Yes  Non-ACS:    Anginal Classification: CCS III  Anti-ischemic medical therapy: Minimal Therapy (1 class of medications)  Non-Invasive Test Results: No non-invasive testing performed  Prior CABG: No previous CABG      History and Physical Interval Note:  07/20/2018 11:23 AM  Lachae Hester Mates  has presented today for surgery, with the diagnosis of cp  The various methods of treatment have been discussed with the patient and family. After consideration of risks, benefits and other options for treatment, the patient has consented to  Procedure(s): LEFT HEART CATH AND CORONARY ANGIOGRAPHY (N/A) as a surgical intervention .  The patient's history has been reviewed, patient examined, no change in status, stable for surgery.  I have reviewed the patient's chart and labs.  Questions were answered to the patient's satisfaction.     Belva Crome III

## 2018-07-20 NOTE — Progress Notes (Addendum)
Progress Note  Patient Name: Angela Clay Date of Encounter: 07/20/2018  Primary Cardiologist: New  Subjective   Recurrent chest pain last night resolved with SL nitro. No dyspnea.   Inpatient Medications    Scheduled Meds: . aspirin  81 mg Oral Daily  . atorvastatin  80 mg Oral QHS  . Influenza vac split quadrivalent PF  0.5 mL Intramuscular Tomorrow-1000  . levothyroxine  112 mcg Oral QAC breakfast  . metoprolol tartrate  12.5 mg Oral BID   Continuous Infusions: . heparin 850 Units/hr (07/20/18 0400)   PRN Meds: acetaminophen, nitroGLYCERIN, nitroGLYCERIN, ondansetron (ZOFRAN) IV   Vital Signs    Vitals:   07/19/18 2106 07/19/18 2227 07/19/18 2324 07/20/18 0307  BP: (!) 105/58 125/66 (!) 117/55 133/68  Pulse: 96 73 90 83  Resp: (!) 30 19 19  (!) 25  Temp: 98.1 F (36.7 C)  98.9 F (37.2 C) 100.2 F (37.9 C)  TempSrc: Oral  Oral Oral  SpO2: 99%  94% 96%  Weight: 75.1 kg   74.6 kg  Height:        Intake/Output Summary (Last 24 hours) at 07/20/2018 0649 Last data filed at 07/20/2018 0400 Gross per 24 hour  Intake 361.07 ml  Output -  Net 361.07 ml   Filed Weights   07/19/18 1651 07/19/18 2106 07/20/18 0307  Weight: 75.3 kg 75.1 kg 74.6 kg    Telemetry    SR in 80s- Personally Reviewed  ECG    None today   Physical Exam   GEN: No acute distress.   Neck: No JVD Cardiac: RRR, no murmurs, rubs, or gallops.  Respiratory: Clear to auscultation bilaterally. GI: Soft, nontender, non-distended  MS: No edema; No deformity. Neuro:  Nonfocal  Psych: Normal affect   Labs    Chemistry Recent Labs  Lab 07/19/18 1711 07/20/18 0427  NA 137 138  K 3.6 3.5  CL 99 101  CO2 28 27  GLUCOSE 132* 136*  BUN 19 12  CREATININE 0.87 0.85  CALCIUM 9.3 8.9  GFRNONAA >60 >60  GFRAA >60 >60  ANIONGAP 10 10     Hematology Recent Labs  Lab 07/19/18 1711 07/20/18 0427  WBC 10.3 9.4  RBC 4.84 4.29  HGB 14.4 12.4  HCT 42.6 38.0  MCV 88.0 88.6  MCH  29.8 28.9  MCHC 33.8 32.6  RDW 14.8 14.4  PLT 197 159    Cardiac Enzymes Recent Labs  Lab 07/19/18 1711 07/19/18 2320 07/20/18 0427  TROPONINI <0.03 <0.03 <0.03     Radiology    Dg Chest 2 View  Result Date: 07/19/2018 CLINICAL DATA:  Sternal chest pain onset last evening. EXAM: CHEST - 2 VIEW COMPARISON:  None. FINDINGS: The heart size and mediastinal contours are within normal limits. Tortuous atherosclerotic aorta without aneurysm. Minimal left basilar atelectasis. Calcified rounded nodular density in the left mid lung compatible with a granuloma. Both lungs are clear. Degenerative changes are present along the dorsal spine. The patient is status post cholecystectomy. IMPRESSION: Aortic atherosclerosis. Minimal left basilar atelectasis. Small granuloma in the left upper lobe or lingula. Electronically Signed   By: Ashley Royalty M.D.   On: 07/19/2018 17:49    Cardiac Studies   Pending cath today   Patient Profile     84 y.o. female with hx of HTN, HLD and hypothyroidism admitted with 2 days hx unstable angina.   Initially thought symptoms due to anxiety but continued to worsen.   Assessment & Plan  1. Unstable angina - Troponin negative x 3. EKG with non specific ST elevation in inferior lateral leads (more pronounced compared to EKG in 01-03-13). Last night had recurrent Chest pressure which resolved with SL nitro x1. Currently chest pain free.  - Continue ASA, statin, BB and IV heparin.   The patient understands that risks include but are not limited to stroke (1 in 1000), death (1 in 81), kidney failure [usually temporary] (1 in 500), bleeding (1 in 200), allergic reaction [possibly serious] (1 in 200), and agrees to proceed.   2. HLD - 01/15/2018: Cholesterol 153; HDL 56.60; LDL Cholesterol 70; Triglycerides 135.0; VLDL 27.0 - Lipitor increased to 80 during admission.   3. HTN - BP stable. Home HCTZ on hold. Started BB.     For questions or updates, please contact  [place] Please consult [email or web address] for contact info under       SignedLeanor Kail, PA  07/20/2018, 6:49 AM    Patient seen, examined. Available data reviewed. Agree with findings, assessment, and plan as outlined by Robbie Lis, PA-C.   The patient is independently interviewed and examined.  She is a very pleasant and functional elderly woman in no distress.  JVP is normal, lungs are clear, heart is regular rate and rhythm with no murmur gallop, abdomen is soft and nontender, extremities show no edema.  The patient is interviewed and her symptoms have some typical and atypical features for unstable angina pectoris.  Troponins are negative.  EKG demonstrates inferolateral ST elevation but is not diagnostic for STEMI.  Echo is currently pending. I agree that cardiac catheterization and possible PCI are indicated.  I have reviewed the risks, indications, and alternatives to cardiac catheterization, possible angioplasty, and stenting with the patient. Risks include but are not limited to bleeding, infection, vascular injury, stroke, myocardial infection, arrhythmia, kidney injury, radiation-related injury in the case of prolonged fluoroscopy use, emergency cardiac surgery, and death. The patient understands the risks of serious complication is 1-2 in 8453 with diagnostic cardiac cath and 1-2% or less with angioplasty/stenting.   Sherren Mocha, M.D. 07/20/2018 9:44 AM

## 2018-07-20 NOTE — Progress Notes (Signed)
Echocardiogram 2D Echocardiogram has been performed.  Angela Clay 07/20/2018, 8:54 AM

## 2018-07-21 DIAGNOSIS — E785 Hyperlipidemia, unspecified: Secondary | ICD-10-CM | POA: Diagnosis not present

## 2018-07-21 DIAGNOSIS — I249 Acute ischemic heart disease, unspecified: Secondary | ICD-10-CM | POA: Diagnosis not present

## 2018-07-21 LAB — CBC
HCT: 35.7 % — ABNORMAL LOW (ref 36.0–46.0)
Hemoglobin: 11.4 g/dL — ABNORMAL LOW (ref 12.0–15.0)
MCH: 28.8 pg (ref 26.0–34.0)
MCHC: 31.9 g/dL (ref 30.0–36.0)
MCV: 90.2 fL (ref 78.0–100.0)
Platelets: 154 10*3/uL (ref 150–400)
RBC: 3.96 MIL/uL (ref 3.87–5.11)
RDW: 14.3 % (ref 11.5–15.5)
WBC: 7.5 10*3/uL (ref 4.0–10.5)

## 2018-07-21 LAB — SEDIMENTATION RATE: Sed Rate: 90 mm/hr — ABNORMAL HIGH (ref 0–22)

## 2018-07-21 MED ORDER — COLCHICINE 0.6 MG PO TABS: 0.6000 mg | ORAL_TABLET | Freq: Every day | ORAL | 0 refills | Status: DC

## 2018-07-21 MED ORDER — COLCHICINE 0.6 MG PO TABS
0.6000 mg | ORAL_TABLET | Freq: Two times a day (BID) | ORAL | Status: DC
  Administered 2018-07-21: 0.6 mg via ORAL
  Filled 2018-07-21: qty 1

## 2018-07-21 MED ORDER — COLCHICINE 0.6 MG PO TABS: 0.6000 mg | ORAL_TABLET | Freq: Every day | ORAL | Status: DC

## 2018-07-21 MED ORDER — COLCHICINE 0.6 MG PO TABS: 0.6000 mg | ORAL_TABLET | Freq: Two times a day (BID) | ORAL | Status: DC

## 2018-07-21 NOTE — Discharge Summary (Signed)
Discharge Summary    Patient ID: Angela Clay,  MRN: [id number], DOB/AGE: 1934/08/09 84 y.o.  Admit date: 07/19/2018 Discharge date: 07/21/2018  Primary Care Provider: [facility] Primary Cardiologist: Dr. Burt Knack   Discharge Diagnoses    Principal Problem:   ACS (acute coronary syndrome) [facility]) Active Problems:   Hyperlipidemia   Essential hypertension   Carotid arterial disease (HCC)   Allergies Allergies  Allergen Reactions  . Codeine Nausea And Vomiting    Diagnostic Studies/Procedures    Cath: 07/20/18   Cine fluoroscopy demonstrates no evidence of coronary calcification.  Normal left main.  The left anterior descending coronary artery wraps around the left ventricular apex.  Luminal irregularities are noted in the mid segment.  No obstructive disease is identified.  The circumflex artery is widely patent and demonstrates no evidence of obstructive atherosclerosis.  The right coronary is dominant and is normal in appearance.  The left ventricular function is normal with EF 60%.  LVEDP is low at 6 mmHg.  Acute angulations/kink in the mid radial artery preventing guidewire passage.  Also eccentric 50% stenosis in the proximal portion of the brachial artery.  RECOMMENDATIONS:   Moderate to femoral access site for bleeding.  Chest discomfort does not appear to be related to myocardial ischemia.  Some features of chest pain and the appearance of EKG suggest the possibility of pericarditis.  We will repeat EKG and check sed rate/CRP.  No indication for antiplatelet therapy at this time.  TTE: 07/20/18  Study Conclusions  - Left ventricle: The cavity size was normal. Systolic function was   normal. The estimated ejection fraction was in the range of 60%   to 65%. Wall motion was normal; there were no regional wall   motion abnormalities. Left ventricular diastolic function   parameters were normal. - Aortic valve: There was trivial  regurgitation. - Atrial septum: There was increased thickness of the septum,   consistent with lipomatous hypertrophy. - Tricuspid valve: There was trivial regurgitation. - Pulmonic valve: There was trivial regurgitation. _____________   History of Present Illness     84 y.o. female with history of HLD, HTN, hypothyroid who presented with unstable angina.   She started having chest pressure the day prior to admission that resolved. She attributed that to GI system, however symptoms returned the day of admisson and she presented to [facility] ED. Trop i<0.03. She had ST elevations not meeting criteria for STEMI in inferior leads. She also had spontaneous resolution of chest pain. She had no other symptoms on presentation including dyspea, chest pain, palpitations etc.   Hospital Course     1. Chest pain: Underwent cardiac cath noted above no obstructive CAD noted. EKG did have the appearance of pericarditis. Echo with normal EF and no WMA. Sed rate and CRP are elevated. Started colchicine 0.17m BID the day of discharge and planned for 0.626mdaily for 2 weeks.    2. HLD -01/15/2018: Cholesterol 153; HDL 56.60; LDL Cholesterol 70; Triglycerides 135.0; VLDL 27.0 - continue home lipitor dose.   3. HTN - BP stable. Will resume home HCTZ given no CAD and normal EF.   Angela M ARDELLE HALIBURTONas seen by Dr. CoBurt Knacknd determined stable for discharge home. Follow up in the office has been arranged. Medications are listed below.   _____________  Discharge Vitals Blood pressure 117/67, pulse 78, temperature 97.9 F (36.6 C), temperature source Oral, resp. rate (!) 23, height 5' 5"  (1.651 m), weight 74.9 kg,  SpO2 98 %.  Filed Weights   07/19/18 2106 07/20/18 0307 07/21/18 0523  Weight: 75.1 kg 74.6 kg 74.9 kg    Labs & Radiologic Studies    CBC Recent Labs    07/20/18 1445 07/21/18 0316  WBC 7.2 7.5  HGB 11.0* 11.4*  HCT 34.5* 35.7*  MCV 90.6 90.2  PLT 139* 299   Basic Metabolic  Panel Recent Labs    07/19/18 1711 07/20/18 0427 07/20/18 1445  NA 137 138  --   K 3.6 3.5  --   CL 99 101  --   CO2 28 27  --   GLUCOSE 132* 136*  --   BUN 19 12  --   CREATININE 0.87 0.85 0.90  CALCIUM 9.3 8.9  --    Liver Function Tests No results for input(s): AST, ALT, ALKPHOS, BILITOT, PROT, ALBUMIN in the last 72 hours. No results for input(s): LIPASE, AMYLASE in the last 72 hours. Cardiac Enzymes Recent Labs    07/19/18 2320 07/20/18 0427 07/20/18 1115  TROPONINI <0.03 <0.03 <0.03   BNP Invalid input(s): POCBNP D-Dimer No results for input(s): DDIMER in the last 72 hours. Hemoglobin A1C No results for input(s): HGBA1C in the last 72 hours. Fasting Lipid Panel No results for input(s): CHOL, HDL, LDLCALC, TRIG, CHOLHDL, LDLDIRECT in the last 72 hours. Thyroid Function Tests No results for input(s): TSH, T4TOTAL, T3FREE, THYROIDAB in the last 72 hours.  Invalid input(s): FREET3 _____________  Dg Chest 2 View  Result Date: 07/19/2018 CLINICAL DATA:  Sternal chest pain onset last evening. EXAM: CHEST - 2 VIEW COMPARISON:  None. FINDINGS: The heart size and mediastinal contours are within normal limits. Tortuous atherosclerotic aorta without aneurysm. Minimal left basilar atelectasis. Calcified rounded nodular density in the left mid lung compatible with a granuloma. Both lungs are clear. Degenerative changes are present along the dorsal spine. The patient is status post cholecystectomy. IMPRESSION: Aortic atherosclerosis. Minimal left basilar atelectasis. Small granuloma in the left upper lobe or lingula. Electronically Signed   By: Ashley Royalty M.D.   On: 07/19/2018 17:49   Disposition   Pt is being discharged home today in good condition.  Follow-up Plans & Appointments    Follow-up Information    Daune Perch, NP Follow up on 08/09/2018.   Specialty:  Nurse Practitioner Why:  at 8am for your follow up appt.  Contact information: [address] [place] [address] [phone]         h Discharge Instructions    Call MD for:  redness, tenderness, or signs of infection (pain, swelling, redness, odor or green/yellow discharge around incision site)   Complete by:  As directed    Diet - low sodium heart healthy   Complete by:  As directed    Discharge instructions   Complete by:  As directed    Radial Site Care Refer to this sheet in the next few weeks. These instructions provide you with information on caring for yourself after your procedure. Your caregiver may also give you more specific instructions. Your treatment has been planned according to current medical practices, but problems sometimes occur. Call your caregiver if you have any problems or questions after your procedure. HOME CARE INSTRUCTIONS You may shower the day after the procedure.Remove the bandage (dressing) and gently wash the site with plain soap and water.Gently pat the site dry.  Do not apply powder or lotion to the site.  Do not submerge the affected site in water  for 3 to 5 days.  Inspect the site at least twice daily.  Do not flex or bend the affected arm for 24 hours.  No lifting over 5 pounds (2.3 kg) for 5 days after your procedure.  Do not drive home if you are discharged the same day of the procedure. Have someone else drive you.  You may drive 24 hours after the procedure unless otherwise instructed by your caregiver.  What to expect: Any bruising will usually fade within 1 to 2 weeks.  Blood that collects in the tissue (hematoma) may be painful to the touch. It should usually decrease in size and tenderness within 1 to 2 weeks.  SEEK IMMEDIATE MEDICAL CARE IF: You have unusual pain at the radial site.  You have redness, warmth, swelling, or pain at the radial site.  You have drainage (other than a small amount of blood on the dressing).  You have chills.  You have a fever or persistent symptoms for more than 72 hours.  You have a fever  and your symptoms suddenly get worse.  Your arm becomes pale, cool, tingly, or numb.  You have heavy bleeding from the site. Hold pressure on the site.   Increase activity slowly   Complete by:  As directed        Discharge Medications     Medication List    TAKE these medications   acetaminophen 500 MG tablet Commonly known as:  TYLENOL Take 500 mg by mouth every 6 (six) hours as needed for mild pain.   aspirin 81 MG tablet Take 81 mg by mouth daily.   atorvastatin 40 MG tablet Commonly known as:  LIPITOR Take 1 tablet (40 mg total) by mouth at bedtime.   calcium-vitamin D 250-125 MG-UNIT tablet Commonly known as:  OSCAL Take 1 tablet by mouth.   colchicine 0.6 MG tablet Take 1 tablet (0.6 mg total) by mouth daily. Start taking on:  07/22/2018   hydrochlorothiazide 25 MG tablet Commonly known as:  HYDRODIURIL Take 1 tablet (25 mg total) by mouth daily.   levothyroxine 112 MCG tablet Commonly known as:  SYNTHROID, LEVOTHROID Take 1 tablet (112 mcg total) by mouth daily before breakfast.   omeprazole 20 MG capsule Commonly known as:  PRILOSEC Take 1 capsule (20 mg total) by mouth daily as needed. What changed:  reasons to take this       Acute coronary syndrome (MI, NSTEMI, STEMI, etc) this admission?: No.     Outstanding Labs/Studies   N/a   Duration of Discharge Encounter   Greater than 30 minutes including physician time.  Signed, Reino Bellis NP-C 07/21/2018, 3:32 PM

## 2018-07-21 NOTE — Progress Notes (Signed)
CARDIAC REHAB PHASE I   Pt ambulating in hallways independently. Denies CP or SOB. Pt states she feels much better, just waiting on d/c.   [id number] Rufina Falco, RN BSN 07/21/2018 2:56 PM

## 2018-07-21 NOTE — Progress Notes (Addendum)
Progress Note  Patient Name: Angela Clay Date of Encounter: 07/21/2018  Primary Cardiologist: No primary care provider on file.  Subjective   Feeling well this morning.   Inpatient Medications    Scheduled Meds: . aspirin  81 mg Oral Daily  . heparin  5,000 Units Subcutaneous Q8H  . levothyroxine  112 mcg Oral QAC breakfast  . metoprolol tartrate  12.5 mg Oral BID  . sodium chloride flush  3 mL Intravenous Q12H   Continuous Infusions: . sodium chloride     PRN Meds: sodium chloride, acetaminophen, acetaminophen, nitroGLYCERIN, nitroGLYCERIN, ondansetron (ZOFRAN) IV, ondansetron (ZOFRAN) IV, sodium chloride flush   Vital Signs    Vitals:   07/20/18 2320 07/21/18 0523 07/21/18 0542 07/21/18 0842  BP: (!) 82/49  114/69 102/64  Pulse: 86  93 85  Resp: 19   14  Temp: 99.5 F (37.5 C)  98.6 F (37 C) 98.5 F (36.9 C)  TempSrc: Oral  Oral Oral  SpO2: 92%  98% 94%  Weight:  74.9 kg    Height:        Intake/Output Summary (Last 24 hours) at 07/21/2018 1246 Last data filed at 07/21/2018 0533 Gross per 24 hour  Intake 620 ml  Output -  Net 620 ml   Filed Weights   07/19/18 2106 07/20/18 0307 07/21/18 0523  Weight: 75.1 kg 74.6 kg 74.9 kg    Telemetry    SR - Personally Reviewed  ECG    N/a - Personally Reviewed  Physical Exam   General: Well developed, well nourished, female appearing in no acute distress. Head: Normocephalic, atraumatic.  Neck: Supple without bruits, JVD. Lungs:  Resp regular and unlabored, CTA. Heart: RRR, S1, S2, no murmur; no rub. Abdomen: Soft, non-tender, non-distended with normoactive bowel sounds.  Extremities: No clubbing, cyanosis, edema. Distal pedal pulses are 2+ bilaterally.  Neuro: Alert and oriented X 3. Moves all extremities spontaneously. Psych: Normal affect.  Labs    Chemistry Recent Labs  Lab 07/19/18 1711 07/20/18 0427 07/20/18 1445  NA 137 138  --   K 3.6 3.5  --   CL 99 101  --   CO2 28 27  --     GLUCOSE 132* 136*  --   BUN 19 12  --   CREATININE 0.87 0.85 0.90  CALCIUM 9.3 8.9  --   GFRNONAA >60 >60 58*  GFRAA >60 >60 >60  ANIONGAP 10 10  --      Hematology Recent Labs  Lab 07/20/18 0427 07/20/18 1445 07/21/18 0316  WBC 9.4 7.2 7.5  RBC 4.29 3.81* 3.96  HGB 12.4 11.0* 11.4*  HCT 38.0 34.5* 35.7*  MCV 88.6 90.6 90.2  MCH 28.9 28.9 28.8  MCHC 32.6 31.9 31.9  RDW 14.4 14.6 14.3  PLT 159 139* 154    Cardiac Enzymes Recent Labs  Lab 07/19/18 1711 07/19/18 2320 07/20/18 0427 07/20/18 1115  TROPONINI <0.03 <0.03 <0.03 <0.03   No results for input(s): TROPIPOC in the last 168 hours.   BNPNo results for input(s): BNP, PROBNP in the last 168 hours.   DDimer No results for input(s): DDIMER in the last 168 hours.    Radiology    Dg Chest 2 View  Result Date: 07/19/2018 CLINICAL DATA:  Sternal chest pain onset last evening. EXAM: CHEST - 2 VIEW COMPARISON:  None. FINDINGS: The heart size and mediastinal contours are within normal limits. Tortuous atherosclerotic aorta without aneurysm. Minimal left basilar atelectasis. Calcified rounded nodular density in  the left mid lung compatible with a granuloma. Both lungs are clear. Degenerative changes are present along the dorsal spine. The patient is status post cholecystectomy. IMPRESSION: Aortic atherosclerosis. Minimal left basilar atelectasis. Small granuloma in the left upper lobe or lingula. Electronically Signed   By: Ashley Royalty M.D.   On: 07/19/2018 17:49    Cardiac Studies   Cath: 07/20/18   Cine fluoroscopy demonstrates no evidence of coronary calcification.  Normal left main.  The left anterior descending coronary artery wraps around the left ventricular apex.  Luminal irregularities are noted in the mid segment.  No obstructive disease is identified.  The circumflex artery is widely patent and demonstrates no evidence of obstructive atherosclerosis.  The right coronary is dominant and is normal in  appearance.  The left ventricular function is normal with EF 60%.  LVEDP is low at 6 mmHg.  Acute angulations/kink in the mid radial artery preventing guidewire passage.  Also eccentric 50% stenosis in the proximal portion of the brachial artery.  RECOMMENDATIONS:   Moderate to femoral access site for bleeding.  Chest discomfort does not appear to be related to myocardial ischemia.  Some features of chest pain and the appearance of EKG suggest the possibility of pericarditis.  We will repeat EKG and check sed rate/CRP.  No indication for antiplatelet therapy at this time.   TTE: 07/20/18  Study Conclusions  - Left ventricle: The cavity size was normal. Systolic function was   normal. The estimated ejection fraction was in the range of 60%   to 65%. Wall motion was normal; there were no regional wall   motion abnormalities. Left ventricular diastolic function   parameters were normal. - Aortic valve: There was trivial regurgitation. - Atrial septum: There was increased thickness of the septum,   consistent with lipomatous hypertrophy. - Tricuspid valve: There was trivial regurgitation. - Pulmonic valve: There was trivial regurgitation.  Patient Profile     84 y.o. female with hx of HTN, HLD and hypothyroidism admitted with 2 days hx unstable angina. Initially thought symptoms due to anxiety but continued to worsen.   Assessment & Plan    1. Chest pain: Underwent cardiac cath noted above no obstructive CAD noted. EKG does have the appearance of pericarditis. Echo with normal EF and no WMA. Sed rate and CRP are elevated. Will start colchicine 0.58m BID.   2. HLD - 01/15/2018: Cholesterol 153; HDL 56.60; LDL Cholesterol 70; Triglycerides 135.0; VLDL 27.0 - continue home lipitor dose.   3. HTN - BP stable. Will resume home HCTZ given no CAD and normal EF.   Signed, LReino Bellis NP  07/21/2018, 12:46 PM  Pager # [phone]  For questions or updates, please contact  [facility] Please consult [email or web address] for contact info under Cardiology/STEMI.   Patient seen, examined. Available data reviewed. Agree with findings, assessment, and plan as outlined by LReino Bellis NP-C.  Exam is benign.  She is a pleasant elderly woman in no distress.  JVP is normal, lungs clear bilaterally, heart regular rate and rhythm with no rub or murmur soft nontender, right radial cath site is clear, no pretibial edema.  Cardiac catheterization data reviewed with normal findings.  Patient will be treated presumptively for pericarditis with a 2-week course of colchicine.  Outpatient follow-up will be arranged.  Symptoms have nearly resolved.  MSherren Mocha M.D. 07/21/2018 1:25 PM

## 2018-07-23 ENCOUNTER — Telehealth: Payer: Self-pay

## 2018-07-23 NOTE — Telephone Encounter (Signed)
Called patient to do TCM follow up states she has appointment scheduled with Caerdiologist.

## 2018-08-02 ENCOUNTER — Other Ambulatory Visit: Payer: Self-pay | Admitting: Internal Medicine

## 2018-08-09 ENCOUNTER — Encounter: Payer: Self-pay | Admitting: Cardiology

## 2018-08-09 ENCOUNTER — Ambulatory Visit: Payer: PPO | Admitting: Cardiology

## 2018-08-09 VITALS — BP 124/80 | HR 98 | Ht 65.0 in | Wt 164.8 lb

## 2018-08-09 DIAGNOSIS — I300 Acute nonspecific idiopathic pericarditis: Secondary | ICD-10-CM | POA: Diagnosis not present

## 2018-08-09 DIAGNOSIS — I10 Essential (primary) hypertension: Secondary | ICD-10-CM | POA: Diagnosis not present

## 2018-08-09 DIAGNOSIS — E785 Hyperlipidemia, unspecified: Secondary | ICD-10-CM

## 2018-08-09 DIAGNOSIS — I319 Disease of pericardium, unspecified: Secondary | ICD-10-CM | POA: Insufficient documentation

## 2018-08-09 NOTE — Progress Notes (Signed)
Cardiology Office Note:    Date:  08/09/2018   ID:  Angela Clay, DOB May 05, 1934, MRN [id number]  PCP:  Colon Branch, MD  Cardiologist:  Sherren Mocha, MD  Referring MD: Colon Branch, MD   Chief Complaint  Patient presents with  . Hospitalization Follow-up    pericarditis    History of Present Illness:    Angela Clay is a 84 y.o. female with a past medical history significant for HLD, HTN, hypothyroid who was admitted to [facility] 07/19/18-07/21/18 for evaluation of chest pain. She had intense pressure on her breast bone. . Trop i<0.03. She had ST elevations not meeting criteria for STEMI in inferior leads. She also had spontaneous resolution of chest pain. She had no other symptoms on presentation including dyspea, chest pain, palpitations etc.  She underwent cardiac on 07/20/18 that shoed no obstructive CAD noted. EKG did have the appearance of pericarditis. Echo with normal EF and no WMA. Sed rate and CRP are elevated. Started colchicine 0.5m BID the day of discharge and planned for 0.671mdaily for 2 weeks.    She is here today for hospital follow-up. She has completed the colchicine. Her chest discomfort which was more in the epigastric area has resolved. She had a little discomfort early last week but that resolved. No shortness of breath, lightheadedness, palpitations. She is feeling back to normal.    Her husband is a patient of Dr. NiJohnsie Cancel  Past Medical History:  Diagnosis Date  . Actinic keratosis 05/2015   Right mid central dorsal hand  . GERD (gastroesophageal reflux disease)    EGD for dysphagia 2000 aprox (-)  . History of radiation therapy   . Hyperlipidemia   . Hypertension   . Hypothyroidism   . Lymph[place] ([place]   [place],unspecified site dx 1970s  . OA (osteoarthritis)    severe at neck  . Osteopenia   . Syncope 11-2012   admited . w/u (-)    Past Surgical History:  Procedure Laterality Date  . APPENDECTOMY    . BELPHAROPTOSIS REPAIR Bilateral  2017  . CHOLECYSTECTOMY  02/24/06  . LEFT HEART CATH AND CORONARY ANGIOGRAPHY N/A 07/20/2018   Procedure: LEFT HEART CATH AND CORONARY ANGIOGRAPHY;  Surgeon: SmBelva CromeMD;  [facility];  Service: Cardiovascular;  Laterality: N/A;  . THYROIDECTOMY    . TONSILLECTOMY AND ADENOIDECTOMY  1949  . TUBAL LIGATION  1972    Current Medications: Current Meds  Medication Sig  . acetaminophen (TYLENOL) 500 MG tablet Take 500 mg by mouth every 6 (six) hours as needed for mild pain.   . Marland Kitchenspirin 81 MG tablet Take 81 mg by mouth daily.    . Marland Kitchentorvastatin (LIPITOR) 40 MG tablet Take 1 tablet (40 mg total) by mouth at bedtime.  . calcium-vitamin D (OSCAL) 250-125 MG-UNIT per tablet Take 1 tablet by mouth.   . hydrochlorothiazide (HYDRODIURIL) 25 MG tablet Take 1 tablet (25 mg total) by mouth daily.  . Marland Kitchenevothyroxine (SYNTHROID, LEVOTHROID) 112 MCG tablet Take 1 tablet (112 mcg total) by mouth daily before breakfast.  . omeprazole (PRILOSEC) 20 MG capsule Take 1 capsule (20 mg total) by mouth daily as needed (acid reflux).     Allergies:   Codeine   Social History   Socioeconomic History  . Marital status: Married    Spouse name: GeIona Beard. Number of children: 3  . Years of education: Not on file  . Highest education level: Not  on file  Occupational History  . Occupation: retired  Scientific laboratory technician  . Financial resource strain: Not on file  . Food insecurity:    Worry: Not on file    Inability: Not on file  . Transportation needs:    Medical: Not on file    Non-medical: Not on file  Tobacco Use  . Smoking status: Never Smoker  . Smokeless tobacco: Never Used  Substance and Sexual Activity  . Alcohol use: No    Alcohol/week: 0.0 standard drinks  . Drug use: No  . Sexual activity: Never    Partners: Male    Birth control/protection: Post-menopausal  Lifestyle  . Physical activity:    Days per week: Not on file    Minutes per session: Not on file  . Stress: Not on file    Relationships  . Social connections:    Talks on phone: Not on file    Gets together: Not on file    Attends religious service: Not on file    Active member of club or organization: Not on file    Attends meetings of clubs or organizations: Not on file    Relationship status: Not on file  Other Topics Concern  . Not on file  Social History Narrative   3 kids, 8 GK   Lives w/ husband   Lost a g-child to suicide 2017        Family History: The patient's family history includes Coronary artery disease in her father; Deep vein thrombosis in her brother; Heart failure in her sister; Ovarian cancer in her sister; Stroke (age of onset: 84) in her mother. There is no history of Diabetes, Colon cancer, or Breast cancer. ROS:   Please see the history of present illness.     All other systems reviewed and are negative.  EKGs/Labs/Other Studies Reviewed:    The following studies were reviewed today:  Cath: 07/20/18  Cine fluoroscopy demonstrates no evidence of coronary calcification.  Normal left main.  The left anterior descending coronary artery wraps around the left ventricular apex. Luminal irregularities are noted in the mid segment. No obstructive disease is identified.  The circumflex artery is widely patent and demonstrates no evidence of obstructive atherosclerosis.  The right coronary is dominant and is normal in appearance.  The left ventricular function is normal with EF 60%. LVEDP is low at 6 mmHg.  Acute angulations/kink in the mid radial artery preventing guidewire passage. Also eccentric 50% stenosis in the proximal portion of the brachial artery.  RECOMMENDATIONS:  Moderate to femoral access site for bleeding.  Chest discomfort does not appear to be related to myocardial ischemia. Some features of chest pain and the appearance of EKG suggest the possibility of pericarditis.  We will repeat EKG and check sed rate/CRP.  No indication for antiplatelet  therapy at this time.  TTE: 07/20/18 Study Conclusion - Left ventricle: The cavity size was normal. Systolic function was normal. The estimated ejection fraction was in the range of 60% to 65%. Wall motion was normal; there were no regional wall motion abnormalities. Left ventricular diastolic function parameters were normal. - Aortic valve: There was trivial regurgitation. - Atrial septum: There was increased thickness of the septum, consistent with lipomatous hypertrophy. - Tricuspid valve: There was trivial regurgitation. - Pulmonic valve: There was trivial regurgitation.  EKG:  EKG is  ordered today.    Recent Labs: 01/15/2018: ALT 27; TSH 0.42 07/20/2018: BUN 12; Creatinine, Ser 0.90; Potassium 3.5; Sodium 138 07/21/2018: Hemoglobin  11.4; Platelets 154   Recent Lipid Panel    Component Value Date/Time   CHOL 153 01/15/2018 1044   TRIG 135.0 01/15/2018 1044   HDL 56.60 01/15/2018 1044   CHOLHDL 3 01/15/2018 1044   VLDL 27.0 01/15/2018 1044   LDLCALC 70 01/15/2018 1044   LDLDIRECT 118.5 08/25/2007 0000    Physical Exam:    VS:  BP 124/80   Pulse 98   Ht 5' 5"  (1.651 m)   Wt 164 lb 12.8 oz (74.8 kg)   SpO2 98%   BMI 27.42 kg/m     Wt Readings from Last 3 Encounters:  08/09/18 164 lb 12.8 oz (74.8 kg)  07/21/18 165 lb 1.6 oz (74.9 kg)  07/19/18 166 lb 3.2 oz (75.4 kg)     Physical Exam  Constitutional: She is oriented to person, place, and time. She appears well-developed and well-nourished. No distress.  HENT:  Head: Normocephalic and atraumatic.  Neck: Normal range of motion. Neck supple. No JVD present.  Cardiovascular: Normal rate, regular rhythm, normal heart sounds and intact distal pulses. Exam reveals no gallop and no friction rub.  No murmur heard. Pulmonary/Chest: Effort normal and breath sounds normal. No respiratory distress. She has no wheezes. She has no rales.  Abdominal: Soft. Bowel sounds are normal.  Musculoskeletal: Normal range of  motion. She exhibits no edema.  Neurological: She is alert and oriented to person, place, and time.  Skin: Skin is warm and dry.  Psychiatric: She has a normal mood and affect. Her behavior is normal. Judgment and thought content normal.  Vitals reviewed.    ASSESSMENT:    1. Hyperlipidemia, unspecified hyperlipidemia type   2. Acute idiopathic pericarditis   3. Essential hypertension    PLAN:    In order of problems listed above:  Pericarditis: Idiopathic.  Has completed 2 weeks of colchicine. Now asymptomatic. Pt feels back to normal and wants to go back to the gym. Advised to complete one months of decreased activity and then can slowly increase activity being cautious not to raise HR too much at first. Will follow up in 2 months and if no further issues, can likely release to her PCP.   HLD: 01/15/2018: Cholesterol 153; HDL 56.60; LDL Cholesterol 70; Triglycerides 135.0; VLDL 27.0 -continue home lipitor dose.   Hypertension: BP well controlled on current regimen with HCTZ.    Medication Adjustments/Labs and Tests Ordered: Current medicines are reviewed at length with the patient today.  Concerns regarding medicines are outlined above. Labs and tests ordered and medication changes are outlined in the patient instructions below:  Patient Instructions  Medication Instructions:  Your physician recommends that you continue on your current medications as directed. Please refer to the Current Medication list given to you today.   If you need a refill on your cardiac medications before your next appointment, please call your pharmacy.   Lab work: None If you have labs (blood work) drawn today and your tests are completely normal, you will receive your results only by: Marland Kitchen MyChart Message (if you have MyChart) OR . A paper copy in the mail If you have any lab test that is abnormal or we need to change your treatment, we will call you to review the  results.  Testing/Procedures: None  Follow-Up: At [facility], you and your health needs are our priority.  As part of our continuing mission to provide you with exceptional heart care, we have created designated Provider Care Teams.  These Care Teams  include your primary Cardiologist (physician) and Advanced Practice Providers (APPs -  Physician Assistants and Nurse Practitioners) who all work together to provide you with the care you need, when you need it. You will need a follow up appointment in:  3-4 months.  Please call our office 2 months in advance to schedule this appointment.  You may see Sherren Mocha, MD or one of the following Advanced Practice Providers on your designated Care Team: Richardson Dopp, PA-C [place], [place] . Daune Perch, NP  Any Other Special Instructions Will Be Listed Below (If Applicable).  AFTER ONE MONTH YOU CAN INCREASE ACTIVITY SLOWLY    Pericarditis Pericarditis is swelling and irritation (inflammation) of your pericardium. The pericardium is a thin, double-layered, fluid-filled sac that surrounds your heart. The pericardium protects and holds your heart in your chest cavity. Inflammation of your pericardium can cause rubbing (friction) between the two layers when your heart beats. Fluid may build up between the layers of the sac (pericardial effusion). Different types of pericarditis include:  Acute pericarditis. Inflammation develops suddenly and causes pericardial effusion.  Chronic pericarditis. Inflammation may develop gradually, or it may continue after acute pericarditis and last longer than 6 months.  Constrictive pericarditis. The layers of the pericardium stiffen and develop scar tissue. The scar tissue thickens and sticks together. This makes it difficult for the heart to pump and to work as it normally does. This type is rare.  In most cases, pericarditis is acute and not serious. Chronic pericarditis and constrictive pericarditis may  be more serious and may require treatment. What are the causes? Often, the cause of pericarditis is not known.If a cause is found, the cause may be:  A viral infection.  A heart attack (myocardial infarction).  Open-heart surgery (coronary artery bypass graft surgery).  Chest injury.  Autoimmune conditions, such as lupus or rheumatoid arthritis.  Kidney failure.  Low-functioning thyroid gland (hypothyroidism).  Cancer from another part of the body that has spread (metastasized) to the pericardium.  Radiation treatment.  Certain medicines, including some seizure medicines, blood thinners, heart medicines, and antibiotics.  A bacterial or fungal infection. This cause is less common.  What increases the risk? The following factors may increase your risk of pericarditis:  Being female.  Being 66-59 years old.  Having had pericarditis before.  Having had a recent upper respiratory tract infection.  What are the signs or symptoms? The most common symptom of pericarditis is chest pain. This pain may:  Be in the center of your chest or the left side of your chest.  Not go away with rest.  Last for many hours or days.  Worsen when you lie down and go away when you sit up and lean forward.  Worsen when you swallow.  Move to your back, neck, or shoulder.  Other symptoms may include:  A chronic, dry cough.  Heart palpitations. These may feel like rapid, fluttering, or pounding heartbeats.  Dizziness or fainting.  Tiredness or fatigue.  Fever.  Rapid breathing.  Shortness of breath when lying down.  How is this diagnosed? This condition is diagnosed with a medical history, physical exam, and diagnostic tests. During your physical exam, your health care provider will listen for friction while your heart beats (pericardial rub). You may also have tests, including:  Blood work to look for signs of infection and inflammation.  Electrocardiogram  (ECG).  Echocardiogram.  CT scan.  MRI.  Culture of pericardial fluid.  A tissue sample (biopsy) of the  pericardium.  If tests show that you may have constrictive pericarditis, you may have a procedure (cardiac catheterization) to confirm this diagnosis. How is this treated? Treatment for this condition depends on the cause and type of pericarditis. In most cases, acute pericarditis will clear up on its own within 10 days. Treatment for other types of pericarditis may include:  Medicines, such as: ? NSAIDs for pain and inflammation. ? Steroids to reduce inflammation. ? Colchicine to relieve pain and inflammation.  A procedure to remove fluid using a needle (pericardiocentesis) if pericardial effusion puts pressure on the heart.  Surgery to remove part of the pericardium if constrictive pericarditis develops.  If another condition is causing your pericarditis, you may need treatment for that underlying condition. Follow these instructions at home:  Do not use tobacco products, including cigarettes, chewing tobacco, or e-cigarettes. If you need help quitting, ask your health care provider.  Maintain a healthy weight.  Follow an exercise program as told by your health care provider. You may need to limit your exercise until your symptoms go away.  Eat a heart-healthy diet. A registered dietitian can help you to learn about healthy food choices.  Take over-the-counter and prescription medicines only as told by your health care provider. Keep a list of all of your medicines with you at all times. For each medicine, include information about the name, the dosage, how often you take it, and how you take it.  Keep all follow-up visits as told by your health care provider. This is important. Contact a health care provider if:  You continue to have symptoms of pericarditis.  You develop new symptoms of pericarditis.  Your symptoms get worse. Get help right away if:  You have  worsening chest pain and difficulty breathing. These symptoms may represent a serious problem that is an emergency. Do not wait to see if the symptoms will go away. Get medical help right away. Call your local emergency services (911 in the [place] Do not drive yourself to the hospital. This information is not intended to replace advice given to you by your health care provider. Make sure you discuss any questions you have with your health care provider. Document Released: 04/08/2001 Document Revised: 03/17/2016 Document Reviewed: 04/25/2015 Elsevier Interactive Patient Education  2018 [place], Daune Perch, NP  08/09/2018 1:27 PM    [facility]

## 2018-08-09 NOTE — Patient Instructions (Addendum)
Medication Instructions:  Your physician recommends that you continue on your current medications as directed. Please refer to the Current Medication list given to you today.   If you need a refill on your cardiac medications before your next appointment, please call your pharmacy.   Lab work: None If you have labs (blood work) drawn today and your tests are completely normal, you will receive your results only by: Marland Kitchen MyChart Message (if you have MyChart) OR . A paper copy in the mail If you have any lab test that is abnormal or we need to change your treatment, we will call you to review the results.  Testing/Procedures: None  Follow-Up: At [facility], you and your health needs are our priority.  As part of our continuing mission to provide you with exceptional heart care, we have created designated Provider Care Teams.  These Care Teams include your primary Cardiologist (physician) and Advanced Practice Providers (APPs -  Physician Assistants and Nurse Practitioners) who all work together to provide you with the care you need, when you need it. You will need a follow up appointment in:  3-4 months.  Please call our office 2 months in advance to schedule this appointment.  You may see Sherren Mocha, MD or one of the following Advanced Practice Providers on your designated Care Team: Richardson Dopp, PA-C [place], [place] . Daune Perch, NP  Any Other Special Instructions Will Be Listed Below (If Applicable).  AFTER ONE MONTH YOU CAN INCREASE ACTIVITY SLOWLY    Pericarditis Pericarditis is swelling and irritation (inflammation) of your pericardium. The pericardium is a thin, double-layered, fluid-filled sac that surrounds your heart. The pericardium protects and holds your heart in your chest cavity. Inflammation of your pericardium can cause rubbing (friction) between the two layers when your heart beats. Fluid may build up between the layers of the sac (pericardial  effusion). Different types of pericarditis include:  Acute pericarditis. Inflammation develops suddenly and causes pericardial effusion.  Chronic pericarditis. Inflammation may develop gradually, or it may continue after acute pericarditis and last longer than 6 months.  Constrictive pericarditis. The layers of the pericardium stiffen and develop scar tissue. The scar tissue thickens and sticks together. This makes it difficult for the heart to pump and to work as it normally does. This type is rare.  In most cases, pericarditis is acute and not serious. Chronic pericarditis and constrictive pericarditis may be more serious and may require treatment. What are the causes? Often, the cause of pericarditis is not known.If a cause is found, the cause may be:  A viral infection.  A heart attack (myocardial infarction).  Open-heart surgery (coronary artery bypass graft surgery).  Chest injury.  Autoimmune conditions, such as lupus or rheumatoid arthritis.  Kidney failure.  Low-functioning thyroid gland (hypothyroidism).  Cancer from another part of the body that has spread (metastasized) to the pericardium.  Radiation treatment.  Certain medicines, including some seizure medicines, blood thinners, heart medicines, and antibiotics.  A bacterial or fungal infection. This cause is less common.  What increases the risk? The following factors may increase your risk of pericarditis:  Being female.  Being 34-22 years old.  Having had pericarditis before.  Having had a recent upper respiratory tract infection.  What are the signs or symptoms? The most common symptom of pericarditis is chest pain. This pain may:  Be in the center of your chest or the left side of your chest.  Not go away with rest.  Last for  many hours or days.  Worsen when you lie down and go away when you sit up and lean forward.  Worsen when you swallow.  Move to your back, neck, or shoulder.  Other  symptoms may include:  A chronic, dry cough.  Heart palpitations. These may feel like rapid, fluttering, or pounding heartbeats.  Dizziness or fainting.  Tiredness or fatigue.  Fever.  Rapid breathing.  Shortness of breath when lying down.  How is this diagnosed? This condition is diagnosed with a medical history, physical exam, and diagnostic tests. During your physical exam, your health care provider will listen for friction while your heart beats (pericardial rub). You may also have tests, including:  Blood work to look for signs of infection and inflammation.  Electrocardiogram (ECG).  Echocardiogram.  CT scan.  MRI.  Culture of pericardial fluid.  A tissue sample (biopsy) of the pericardium.  If tests show that you may have constrictive pericarditis, you may have a procedure (cardiac catheterization) to confirm this diagnosis. How is this treated? Treatment for this condition depends on the cause and type of pericarditis. In most cases, acute pericarditis will clear up on its own within 10 days. Treatment for other types of pericarditis may include:  Medicines, such as: ? NSAIDs for pain and inflammation. ? Steroids to reduce inflammation. ? Colchicine to relieve pain and inflammation.  A procedure to remove fluid using a needle (pericardiocentesis) if pericardial effusion puts pressure on the heart.  Surgery to remove part of the pericardium if constrictive pericarditis develops.  If another condition is causing your pericarditis, you may need treatment for that underlying condition. Follow these instructions at home:  Do not use tobacco products, including cigarettes, chewing tobacco, or e-cigarettes. If you need help quitting, ask your health care provider.  Maintain a healthy weight.  Follow an exercise program as told by your health care provider. You may need to limit your exercise until your symptoms go away.  Eat a heart-healthy diet. A registered  dietitian can help you to learn about healthy food choices.  Take over-the-counter and prescription medicines only as told by your health care provider. Keep a list of all of your medicines with you at all times. For each medicine, include information about the name, the dosage, how often you take it, and how you take it.  Keep all follow-up visits as told by your health care provider. This is important. Contact a health care provider if:  You continue to have symptoms of pericarditis.  You develop new symptoms of pericarditis.  Your symptoms get worse. Get help right away if:  You have worsening chest pain and difficulty breathing. These symptoms may represent a serious problem that is an emergency. Do not wait to see if the symptoms will go away. Get medical help right away. Call your local emergency services (911 in the [place] Do not drive yourself to the hospital. This information is not intended to replace advice given to you by your health care provider. Make sure you discuss any questions you have with your health care provider. Document Released: 04/08/2001 Document Revised: 03/17/2016 Document Reviewed: 04/25/2015 Elsevier Interactive Patient Education  [employer].

## 2018-09-21 DIAGNOSIS — L821 Other seborrheic keratosis: Secondary | ICD-10-CM | POA: Diagnosis not present

## 2018-09-21 DIAGNOSIS — Z85828 Personal history of other malignant neoplasm of skin: Secondary | ICD-10-CM | POA: Diagnosis not present

## 2018-09-21 DIAGNOSIS — L570 Actinic keratosis: Secondary | ICD-10-CM | POA: Diagnosis not present

## 2018-10-27 HISTORY — PX: OTHER SURGICAL HISTORY: SHX169

## 2018-11-08 ENCOUNTER — Other Ambulatory Visit: Payer: Self-pay | Admitting: Internal Medicine

## 2018-11-18 DIAGNOSIS — H35373 Puckering of macula, bilateral: Secondary | ICD-10-CM | POA: Diagnosis not present

## 2018-11-18 DIAGNOSIS — H2513 Age-related nuclear cataract, bilateral: Secondary | ICD-10-CM | POA: Diagnosis not present

## 2018-11-19 DIAGNOSIS — Z1231 Encounter for screening mammogram for malignant neoplasm of breast: Secondary | ICD-10-CM | POA: Diagnosis not present

## 2018-11-19 LAB — HM MAMMOGRAPHY

## 2018-11-23 DIAGNOSIS — H2513 Age-related nuclear cataract, bilateral: Secondary | ICD-10-CM | POA: Diagnosis not present

## 2018-11-23 DIAGNOSIS — H4322 Crystalline deposits in vitreous body, left eye: Secondary | ICD-10-CM | POA: Diagnosis not present

## 2018-11-23 DIAGNOSIS — H35341 Macular cyst, hole, or pseudohole, right eye: Secondary | ICD-10-CM | POA: Diagnosis not present

## 2018-11-29 ENCOUNTER — Ambulatory Visit: Payer: PPO | Admitting: Cardiovascular Disease

## 2018-11-29 ENCOUNTER — Encounter: Payer: Self-pay | Admitting: Internal Medicine

## 2018-11-29 ENCOUNTER — Encounter: Payer: Self-pay | Admitting: Cardiovascular Disease

## 2018-11-29 VITALS — BP 124/80 | HR 101 | Ht 65.0 in | Wt 168.8 lb

## 2018-11-29 DIAGNOSIS — E782 Mixed hyperlipidemia: Secondary | ICD-10-CM

## 2018-11-29 DIAGNOSIS — I10 Essential (primary) hypertension: Secondary | ICD-10-CM

## 2018-11-29 NOTE — Patient Instructions (Signed)
Medication Instructions:  Your provider recommends that you continue on your current medications as directed. Please refer to the Current Medication list given to you today.    Labwork: None  Testing/Procedures: None  Follow-Up: Your provider wants you to follow-up in: 1 year with Dr.Cooper. You will receive a reminder letter in the mail two months in advance. If you don't receive a letter, please call our office to schedule the follow-up appointment.    Any Other Special Instructions Will Be Listed Below (If Applicable).     If you need a refill on your cardiac medications before your next appointment, please call your pharmacy.

## 2018-11-29 NOTE — Progress Notes (Signed)
Cardiology Office Note:    Date:  11/29/2018   ID:  Angela Clay, DOB Mar 14, 1934, MRN [id number]  PCP:  Colon Branch, MD  Cardiologist:  Sherren Mocha, MD  Electrophysiologist:  None   Referring MD: Colon Branch, MD   Chief Complaint  Patient presents with  . Follow-up    Pericarditis    History of Present Illness:    Angela Clay is a 85 y.o. female with a hx of chest pain in September 2019.  She underwent cardiac catheterization demonstrating no obstructive coronary artery disease.  Her EKG had some characteristics consistent with acute pericarditis and she was treated with colchicine for a few weeks.  Her sed rate and CRP were both elevated at the time.  Fortunately she is had no recurrence of chest pain or pressure.  She denies dyspnea, heart palpitations, orthopnea, or PND.  She feels well.  She has not been engaged in regular exercise over the last few months and states she is just gotten out of the habit.  Past Medical History:  Diagnosis Date  . Actinic keratosis 05/2015   Right mid central dorsal hand  . GERD (gastroesophageal reflux disease)    EGD for dysphagia 2000 aprox (-)  . History of radiation therapy   . Hyperlipidemia   . Hypertension   . Hypothyroidism   . Lymphoma ([place])    neck,mlig,unspecified site dx 1970s  . OA (osteoarthritis)    severe at neck  . Osteopenia   . Syncope 11-2012   admited . w/u (-)    Past Surgical History:  Procedure Laterality Date  . APPENDECTOMY    . BELPHAROPTOSIS REPAIR Bilateral 2017  . CHOLECYSTECTOMY  02/24/06  . LEFT HEART CATH AND CORONARY ANGIOGRAPHY N/A 07/20/2018   Procedure: LEFT HEART CATH AND CORONARY ANGIOGRAPHY;  Surgeon: Belva Crome, MD;  Location: [facility];  Service: Cardiovascular;  Laterality: N/A;  . THYROIDECTOMY    . TONSILLECTOMY AND ADENOIDECTOMY  1949  . TUBAL LIGATION  1972    Current Medications: Current Meds  Medication Sig  . acetaminophen (TYLENOL) 500 MG tablet Take 500 mg by  mouth every 6 (six) hours as needed for mild pain.   Marland Kitchen aspirin 81 MG tablet Take 81 mg by mouth daily.    Marland Kitchen atorvastatin (LIPITOR) 40 MG tablet Take 1 tablet (40 mg total) by mouth at bedtime.  . calcium-vitamin D (OSCAL) 250-125 MG-UNIT per tablet Take 1 tablet by mouth.   . hydrochlorothiazide (HYDRODIURIL) 25 MG tablet Take 1 tablet (25 mg total) by mouth daily.  Marland Kitchen levothyroxine (SYNTHROID, LEVOTHROID) 112 MCG tablet Take 1 tablet (112 mcg total) by mouth daily before breakfast.  . omeprazole (PRILOSEC) 20 MG capsule Take 1 capsule (20 mg total) by mouth daily as needed (acid reflux).     Allergies:   Codeine   Social History   Socioeconomic History  . Marital status: Married    Spouse name: Iona Beard  . Number of children: 3  . Years of education: Not on file  . Highest education level: Not on file  Occupational History  . Occupation: retired  Scientific laboratory technician  . Financial resource strain: Not on file  . Food insecurity:    Worry: Not on file    Inability: Not on file  . Transportation needs:    Medical: Not on file    Non-medical: Not on file  Tobacco Use  . Smoking status: Never Smoker  . Smokeless tobacco: Never Used  Substance and Sexual Activity  . Alcohol use: No    Alcohol/week: 0.0 standard drinks  . Drug use: No  . Sexual activity: Never    Partners: Male    Birth control/protection: Post-menopausal  Lifestyle  . Physical activity:    Days per week: Not on file    Minutes per session: Not on file  . Stress: Not on file  Relationships  . Social connections:    Talks on phone: Not on file    Gets together: Not on file    Attends religious service: Not on file    Active member of club or organization: Not on file    Attends meetings of clubs or organizations: Not on file    Relationship status: Not on file  Other Topics Concern  . Not on file  Social History Narrative   3 kids, 8 GK   Lives w/ husband   Lost a g-child to suicide 2017        Family  History: The patient's family history includes Coronary artery disease in her father; Deep vein thrombosis in her brother; Heart failure in her sister; Ovarian cancer in her sister; Stroke (age of onset: 43) in her mother. There is no history of Diabetes, Colon cancer, or Breast cancer.  ROS:   Please see the history of present illness.    Positive for visual disturbance.  All other systems reviewed and are negative.  EKGs/Labs/Other Studies Reviewed:    The following studies were reviewed today: Cardiac Catheterization 07/20/2018: Conclusion    Cine fluoroscopy demonstrates no evidence of coronary calcification.  Normal left main.  The left anterior descending coronary artery wraps around the left ventricular apex.  Luminal irregularities are noted in the mid segment.  No obstructive disease is identified.  The circumflex artery is widely patent and demonstrates no evidence of obstructive atherosclerosis.  The right coronary is dominant and is normal in appearance.  The left ventricular function is normal with EF 60%.  LVEDP is low at 6 mmHg.  Acute angulations/kink in the mid radial artery preventing guidewire passage.  Also eccentric 50% stenosis in the proximal portion of the brachial artery.  RECOMMENDATIONS:   Moderate to femoral access site for bleeding.  Chest discomfort does not appear to be related to myocardial ischemia.  Some features of chest pain and the appearance of EKG suggest the possibility of pericarditis.  We will repeat EKG and check sed rate/CRP.   Echo 07-20-2018: Study Conclusions  - Left ventricle: The cavity size was normal. Systolic function was   normal. The estimated ejection fraction was in the range of 60%   to 65%. Wall motion was normal; there were no regional wall   motion abnormalities. Left ventricular diastolic function   parameters were normal. - Aortic valve: There was trivial regurgitation. - Atrial septum: There was increased  thickness of the septum,   consistent with lipomatous hypertrophy. - Tricuspid valve: There was trivial regurgitation. - Pulmonic valve: There was trivial regurgitation.  EKG:  EKG is not ordered today.   Recent Labs: 01/15/2018: ALT 27; TSH 0.42 07/20/2018: BUN 12; Creatinine, Ser 0.90; Potassium 3.5; Sodium 138 07/21/2018: Hemoglobin 11.4; Platelets 154  Recent Lipid Panel    Component Value Date/Time   CHOL 153 01/15/2018 1044   TRIG 135.0 01/15/2018 1044   HDL 56.60 01/15/2018 1044   CHOLHDL 3 01/15/2018 1044   VLDL 27.0 01/15/2018 1044   LDLCALC 70 01/15/2018 1044   LDLDIRECT 118.5 08/25/2007 0000  Physical Exam:    VS:  BP 124/80   Pulse (!) 101   Ht 5' 5"  (1.651 m)   Wt 168 lb 12.8 oz (76.6 kg)   SpO2 96%   BMI 28.09 kg/m     Wt Readings from Last 3 Encounters:  11/29/18 168 lb 12.8 oz (76.6 kg)  08/09/18 164 lb 12.8 oz (74.8 kg)  07/21/18 165 lb 1.6 oz (74.9 kg)     GEN:  Well nourished, well developed in no acute distress HEENT: Normal NECK: No JVD; No carotid bruits LYMPHATICS: No lymphadenopathy CARDIAC: RRR, no murmurs, rubs, gallops RESPIRATORY:  Clear to auscultation without rales, wheezing or rhonchi  ABDOMEN: Soft, non-tender, non-distended MUSCULOSKELETAL:  No edema; No deformity  SKIN: Warm and dry NEUROLOGIC:  Alert and oriented x 3 PSYCHIATRIC:  Normal affect   ASSESSMENT:    1. Essential hypertension   2. Mixed hyperlipidemia    PLAN:    In order of problems listed above:  1. The patient takes hydrochlorothiazide.  Her blood pressure is controlled.  Most recent labs are reviewed. 2. Lipids are at goal on atorvastatin 40 mg daily.  Overall the patient appears to be doing well after an episode of pericarditis about 4 months ago.  She has had no recurrence.  I will see her back for follow-up in 1 year.  If she remains asymptomatic I would plan on seeing her as needed in the future.   Medication Adjustments/Labs and Tests  Ordered: Current medicines are reviewed at length with the patient today.  Concerns regarding medicines are outlined above.  No orders of the defined types were placed in this encounter.  No orders of the defined types were placed in this encounter.   Patient Instructions  Medication Instructions:  Your provider recommends that you continue on your current medications as directed. Please refer to the Current Medication list given to you today.    Labwork: None  Testing/Procedures: None  Follow-Up: Your provider wants you to follow-up in: 1 year with Dr.Daryle Amis. You will receive a reminder letter in the mail two months in advance. If you don't receive a letter, please call our office to schedule the follow-up appointment.    Any Other Special Instructions Will Be Listed Below (If Applicable).     If you need a refill on your cardiac medications before your next appointment, please call your pharmacy.      Signed, Sherren Mocha, MD  11/29/2018 11:44 AM    [facility]

## 2019-01-03 DIAGNOSIS — H25811 Combined forms of age-related cataract, right eye: Secondary | ICD-10-CM | POA: Diagnosis not present

## 2019-01-03 DIAGNOSIS — H2511 Age-related nuclear cataract, right eye: Secondary | ICD-10-CM | POA: Diagnosis not present

## 2019-01-03 DIAGNOSIS — H25011 Cortical age-related cataract, right eye: Secondary | ICD-10-CM | POA: Diagnosis not present

## 2019-01-10 HISTORY — PX: CATARACT EXTRACTION, BILATERAL: SHX1313

## 2019-01-17 DIAGNOSIS — H35341 Macular cyst, hole, or pseudohole, right eye: Secondary | ICD-10-CM | POA: Diagnosis not present

## 2019-01-17 DIAGNOSIS — H2512 Age-related nuclear cataract, left eye: Secondary | ICD-10-CM | POA: Diagnosis not present

## 2019-01-17 DIAGNOSIS — H4322 Crystalline deposits in vitreous body, left eye: Secondary | ICD-10-CM | POA: Diagnosis not present

## 2019-01-17 DIAGNOSIS — H43821 Vitreomacular adhesion, right eye: Secondary | ICD-10-CM | POA: Diagnosis not present

## 2019-01-21 ENCOUNTER — Encounter: Payer: PPO | Admitting: Internal Medicine

## 2019-02-11 ENCOUNTER — Encounter: Payer: Self-pay | Admitting: Internal Medicine

## 2019-02-15 ENCOUNTER — Ambulatory Visit (INDEPENDENT_AMBULATORY_CARE_PROVIDER_SITE_OTHER): Payer: PPO | Admitting: Internal Medicine

## 2019-02-15 ENCOUNTER — Telehealth: Payer: Self-pay | Admitting: Internal Medicine

## 2019-02-15 DIAGNOSIS — R739 Hyperglycemia, unspecified: Secondary | ICD-10-CM | POA: Diagnosis not present

## 2019-02-15 DIAGNOSIS — R079 Chest pain, unspecified: Secondary | ICD-10-CM

## 2019-02-15 DIAGNOSIS — E039 Hypothyroidism, unspecified: Secondary | ICD-10-CM

## 2019-02-15 NOTE — Progress Notes (Signed)
Subjective:    Patient ID: Angela Clay, female    DOB: November 17, 1933, 85 y.o.   MRN: [id number]  DOS:  02/15/2019 Type of visit - description: Virtual Visit via Video Note  I connected with@ on 02/16/19 at 11:00 AM EDT by a video enabled telemedicine application and verified that I am speaking with the correct person using two identifiers.   THIS ENCOUNTER IS A VIRTUAL VISIT DUE TO COVID-19 - PATIENT WAS NOT SEEN IN THE OFFICE. PATIENT HAS CONSENTED TO VIRTUAL VISIT / TELEMEDICINE VISIT   Location of patient: home  Location of provider: office  I discussed the limitations of evaluation and management by telemedicine and the availability of in person appointments. The patient expressed understanding and agreed to proceed.  History of Present Illness: Routine visit Since her last office visit she was admitted to hospital with chest pain: Cardiac catheterization was negative, at the time they suspected pericarditis. Subsequently she saw cardiology 11/29/2018, she was felt to be stable, was recommended to RTC in 1 year.   BP Readings from Last 3 Encounters:  11/29/18 124/80  08/09/18 124/80  07/21/18 117/67     Review of Systems We review her medications: Good compliance without apparent side effects. Ambulatory BPs are checked, usually in the 120s, today for some reason was a slightly elevated: 140/90. She remains chest pain-free at this point. COVID-19: Following all the precautions, she is essentially staying home.  Past Medical History:  Diagnosis Date  . Actinic keratosis 05/2015   Right mid central dorsal hand  . GERD (gastroesophageal reflux disease)    EGD for dysphagia 2000 aprox (-)  . History of radiation therapy   . Hyperlipidemia   . Hypertension   . Hypothyroidism   . Lymphoma ([place])    neck,mlig,unspecified site dx 1970s  . OA (osteoarthritis)    severe at neck  . Osteopenia   . Syncope 11-2012   admited . w/u (-)    Past Surgical History:  Procedure  Laterality Date  . APPENDECTOMY    . BELPHAROPTOSIS REPAIR Bilateral 2017  . CHOLECYSTECTOMY  02/24/06  . LEFT HEART CATH AND CORONARY ANGIOGRAPHY N/A 07/20/2018   Procedure: LEFT HEART CATH AND CORONARY ANGIOGRAPHY;  Surgeon: Belva Crome, MD;  Location: [facility];  Service: Cardiovascular;  Laterality: N/A;  . THYROIDECTOMY    . TONSILLECTOMY AND ADENOIDECTOMY  1949  . TUBAL LIGATION  1972    Social History   Socioeconomic History  . Marital status: Married    Spouse name: Iona Beard  . Number of children: 3  . Years of education: Not on file  . Highest education level: Not on file  Occupational History  . Occupation: retired  Scientific laboratory technician  . Financial resource strain: Not on file  . Food insecurity:    Worry: Not on file    Inability: Not on file  . Transportation needs:    Medical: Not on file    Non-medical: Not on file  Tobacco Use  . Smoking status: Never Smoker  . Smokeless tobacco: Never Used  Substance and Sexual Activity  . Alcohol use: No    Alcohol/week: 0.0 standard drinks  . Drug use: No  . Sexual activity: Never    Partners: Male    Birth control/protection: Post-menopausal  Lifestyle  . Physical activity:    Days per week: Not on file    Minutes per session: Not on file  . Stress: Not on file  Relationships  . Social  connections:    Talks on phone: Not on file    Gets together: Not on file    Attends religious service: Not on file    Active member of club or organization: Not on file    Attends meetings of clubs or organizations: Not on file    Relationship status: Not on file  . Intimate partner violence:    Fear of current or ex partner: Not on file    Emotionally abused: Not on file    Physically abused: Not on file    Forced sexual activity: Not on file  Other Topics Concern  . Not on file  Social History Narrative   3 kids, 8 GK   Lives w/ husband   Lost a g-child to suicide 2017         Allergies as of 02/15/2019       Reactions   Codeine Nausea And Vomiting      Medication List       Accurate as of February 15, 2019 11:59 PM. Always use your most recent med list.        acetaminophen 500 MG tablet Commonly known as:  TYLENOL Take 500 mg by mouth every 6 (six) hours as needed for mild pain.   aspirin 81 MG tablet Take 81 mg by mouth daily.   atorvastatin 40 MG tablet Commonly known as:  LIPITOR Take 1 tablet (40 mg total) by mouth at bedtime.   calcium-vitamin D 250-125 MG-UNIT tablet Commonly known as:  OSCAL Take 1 tablet by mouth.   hydrochlorothiazide 25 MG tablet Commonly known as:  HYDRODIURIL Take 1 tablet (25 mg total) by mouth daily.   levothyroxine 112 MCG tablet Commonly known as:  SYNTHROID Take 1 tablet (112 mcg total) by mouth daily before breakfast.   omeprazole 20 MG capsule Commonly known as:  PRILOSEC Take 1 capsule (20 mg total) by mouth daily as needed (acid reflux).           Objective:   Physical Exam There were no vitals taken for this visit. This is a virtual visit, video, alert oriented x3, no apparent distress    Assessment     Assessment Prediabetes Hyperlipidemia HTN Hypothyroidism Carotid artery disease [place] 11-2014: 60-79% RICA stenosis. 65-53% LICA stenosis [place] 04/4826: 1-39 % B. 1 year [place] 06/2017 1-39%, no further routine [place] DJD, severe at the neck GERD, dysphagia remotely Osteopenia: -s/p fosamax 2007 to 2012. -T score -1.7 (01-2015)  -T score -1.8 (04-2017) rx ca- vitamin D Lymphoma,  Neck, 1970s Syncope, admitted-2014, workup negative Shingles: 10/02/2016, left arm Chest pain: Cath 07/19/2018, negative, pericarditis?  PLAN: Prediabetes: Last A1c 6.1, due for A1c Hyperlipidemia: On Lipitor, due for labs. Hypothyroidism: Good med compliance, due for a TSH Chest pain, pericarditis?  Admitted with chest pain on 06/2018, cardiac catheterization negative, pericarditis?.  Currently asymptomatic.  Last visit with cardiology 11/29/2018.  Under  normal circumstances I will bring the patient to the office under blood work, given the COVID-19 situation that is not advisable, patient is doing well. Plan: My staff will reach out to schedule a face-to-face visit in 2 months, CPX, fasting.

## 2019-02-15 NOTE — Telephone Encounter (Signed)
Return in about 2 months (around 04/17/2019) for physical exam, fasting.Hulen Skains PT left msg to call back and scdh

## 2019-02-16 NOTE — Assessment & Plan Note (Signed)
Prediabetes: Last A1c 6.1, due for A1c Hyperlipidemia: On Lipitor, due for labs. Hypothyroidism: Good med compliance, due for a TSH Chest pain, pericarditis?  Admitted with chest pain on 06/2018, cardiac catheterization negative, pericarditis?.  Currently asymptomatic.  Last visit with cardiology 11/29/2018.  Under normal circumstances I will bring the patient to the office under blood work, given the COVID-19 situation that is not advisable, patient is doing well. Plan: My staff will reach out to schedule a face-to-face visit in 2 months, CPX, fasting.

## 2019-03-09 DIAGNOSIS — Z961 Presence of intraocular lens: Secondary | ICD-10-CM | POA: Diagnosis not present

## 2019-03-09 DIAGNOSIS — H35341 Macular cyst, hole, or pseudohole, right eye: Secondary | ICD-10-CM | POA: Diagnosis not present

## 2019-03-09 DIAGNOSIS — H43821 Vitreomacular adhesion, right eye: Secondary | ICD-10-CM | POA: Diagnosis not present

## 2019-03-14 ENCOUNTER — Encounter: Payer: Self-pay | Admitting: Internal Medicine

## 2019-03-16 ENCOUNTER — Other Ambulatory Visit: Payer: Self-pay | Admitting: Internal Medicine

## 2019-03-17 DIAGNOSIS — H35341 Macular cyst, hole, or pseudohole, right eye: Secondary | ICD-10-CM | POA: Diagnosis not present

## 2019-03-17 DIAGNOSIS — Z09 Encounter for follow-up examination after completed treatment for conditions other than malignant neoplasm: Secondary | ICD-10-CM | POA: Diagnosis not present

## 2019-03-23 ENCOUNTER — Other Ambulatory Visit: Payer: Self-pay | Admitting: Internal Medicine

## 2019-05-05 ENCOUNTER — Other Ambulatory Visit: Payer: Self-pay | Admitting: Internal Medicine

## 2019-05-05 DIAGNOSIS — Z09 Encounter for follow-up examination after completed treatment for conditions other than malignant neoplasm: Secondary | ICD-10-CM | POA: Diagnosis not present

## 2019-05-05 DIAGNOSIS — H35341 Macular cyst, hole, or pseudohole, right eye: Secondary | ICD-10-CM | POA: Diagnosis not present

## 2019-05-09 ENCOUNTER — Encounter: Payer: Self-pay | Admitting: Internal Medicine

## 2019-05-10 ENCOUNTER — Encounter: Payer: Self-pay | Admitting: Internal Medicine

## 2019-05-10 ENCOUNTER — Other Ambulatory Visit: Payer: Self-pay

## 2019-05-10 ENCOUNTER — Ambulatory Visit (INDEPENDENT_AMBULATORY_CARE_PROVIDER_SITE_OTHER): Payer: PPO | Admitting: Internal Medicine

## 2019-05-10 VITALS — BP 123/75 | HR 86 | Temp 98.0°F | Resp 16 | Ht 65.0 in | Wt 164.0 lb

## 2019-05-10 DIAGNOSIS — E559 Vitamin D deficiency, unspecified: Secondary | ICD-10-CM | POA: Diagnosis not present

## 2019-05-10 DIAGNOSIS — R945 Abnormal results of liver function studies: Secondary | ICD-10-CM | POA: Diagnosis not present

## 2019-05-10 DIAGNOSIS — R739 Hyperglycemia, unspecified: Secondary | ICD-10-CM

## 2019-05-10 DIAGNOSIS — E039 Hypothyroidism, unspecified: Secondary | ICD-10-CM

## 2019-05-10 DIAGNOSIS — Z0000 Encounter for general adult medical examination without abnormal findings: Secondary | ICD-10-CM

## 2019-05-10 DIAGNOSIS — R7989 Other specified abnormal findings of blood chemistry: Secondary | ICD-10-CM

## 2019-05-10 LAB — TSH: TSH: 0.49 u[IU]/mL (ref 0.35–4.50)

## 2019-05-10 LAB — CBC WITH DIFFERENTIAL/PLATELET
Basophils Absolute: 0 10*3/uL (ref 0.0–0.1)
Basophils Relative: 0.7 % (ref 0.0–3.0)
Eosinophils Absolute: 0.2 10*3/uL (ref 0.0–0.7)
Eosinophils Relative: 3.7 % (ref 0.0–5.0)
HCT: 38.6 % (ref 36.0–46.0)
Hemoglobin: 12.7 g/dL (ref 12.0–15.0)
Lymphocytes Relative: 18.3 % (ref 12.0–46.0)
Lymphs Abs: 1.1 10*3/uL (ref 0.7–4.0)
MCHC: 32.9 g/dL (ref 30.0–36.0)
MCV: 87 fl (ref 78.0–100.0)
Monocytes Absolute: 0.4 10*3/uL (ref 0.1–1.0)
Monocytes Relative: 7 % (ref 3.0–12.0)
Neutro Abs: 4.1 10*3/uL (ref 1.4–7.7)
Neutrophils Relative %: 70.3 % (ref 43.0–77.0)
Platelets: 235 10*3/uL (ref 150.0–400.0)
RBC: 4.43 Mil/uL (ref 3.87–5.11)
RDW: 16.2 % — ABNORMAL HIGH (ref 11.5–15.5)
WBC: 5.8 10*3/uL (ref 4.0–10.5)

## 2019-05-10 LAB — VITAMIN D 25 HYDROXY (VIT D DEFICIENCY, FRACTURES): VITD: 22.76 ng/mL — ABNORMAL LOW (ref 30.00–100.00)

## 2019-05-10 LAB — LIPID PANEL
Cholesterol: 133 mg/dL (ref 0–200)
HDL: 40.7 mg/dL (ref >39.00)
LDL Cholesterol: 64 mg/dL (ref 0–99)
NonHDL: 92.3
Total CHOL/HDL Ratio: 3
Triglycerides: 143 mg/dL (ref 0.0–149.0)
VLDL: 28.6 mg/dL (ref 0.0–40.0)

## 2019-05-10 LAB — COMPREHENSIVE METABOLIC PANEL
ALT: 45 U/L — ABNORMAL HIGH (ref 0–35)
AST: 44 U/L — ABNORMAL HIGH (ref 0–37)
Albumin: 4.2 g/dL (ref 3.5–5.2)
Alkaline Phosphatase: 215 U/L — ABNORMAL HIGH (ref 39–117)
BUN: 20 mg/dL (ref 6–23)
CO2: 31 mEq/L (ref 19–32)
Calcium: 9.8 mg/dL (ref 8.4–10.5)
Chloride: 100 mEq/L (ref 96–112)
Creatinine, Ser: 0.88 mg/dL (ref 0.40–1.20)
GFR: 61.3 mL/min (ref >60.00)
Glucose, Bld: 125 mg/dL — ABNORMAL HIGH (ref 70–99)
Potassium: 4.7 mEq/L (ref 3.5–5.1)
Sodium: 139 mEq/L (ref 135–145)
Total Bilirubin: 1 mg/dL (ref 0.2–1.2)
Total Protein: 8.6 g/dL — ABNORMAL HIGH (ref 6.0–8.3)

## 2019-05-10 LAB — HEMOGLOBIN A1C: Hgb A1c MFr Bld: 6.8 % — ABNORMAL HIGH (ref 4.6–6.5)

## 2019-05-10 MED ORDER — SHINGRIX 50 MCG/0.5ML IM SUSR: 0.5000 mL | Freq: Once | INTRAMUSCULAR | 1 refills | Status: AC

## 2019-05-10 MED ORDER — TETANUS-DIPHTH-ACELL PERTUSSIS 5-2.5-18.5 LF-MCG/0.5 IM SUSP: 0.5000 mL | Freq: Once | INTRAMUSCULAR | 0 refills | Status: AC

## 2019-05-10 NOTE — Assessment & Plan Note (Addendum)
-  Td 11-09, tdap RX printed -PNM 23  booster 12-2017 -prevnar 2015 - shingles shot 02-2013 , shingrex rx printed  rec flu shot early 2020 --[facility]--10/2012--Dr Brodie--benign polyp--no recall needed  - Female care:  02-2011, vulvar biopsy was negative ,uses clobetasol prn Saw gyn 05/2017, had a PAP (-), had a  pelvic exam, was told to RTC periodically  Still has vulvar irritation on-off, we agree she will see gyn, declined referral  --MMG 11/2018 neg  -- life style: no gym (closed by Covid), not very active; counseled  --Labs: cmp flp cbc a1c tsh vit d

## 2019-05-10 NOTE — Progress Notes (Signed)
Pre visit review using our clinic review tool, if applicable. No additional management support is needed unless otherwise documented below in the visit note. 

## 2019-05-10 NOTE — Progress Notes (Signed)
Subjective:    Patient ID: Angela Clay, female    DOB: 1934-02-11, 85 y.o.   MRN: [id number]  DOS:  05/10/2019 Type of visit - description: here for CPX Since the last office visit she is doing well.   BP Readings from Last 3 Encounters:  05/10/19 123/75  11/29/18 124/80  08/09/18 124/80     Review of Systems Admits to some stress, related to quarantine.  But no anxiety or depression Reports ongoing on and off vulvar irritation, not a new issue.  Other than above, a 14 point review of systems is negative     Past Medical History:  Diagnosis Date  . Actinic keratosis 05/2015   Right mid central dorsal hand  . GERD (gastroesophageal reflux disease)    EGD for dysphagia 2000 aprox (-)  . History of radiation therapy   . Hyperlipidemia   . Hypertension   . Hypothyroidism   . Lymphoma ([place])    neck,mlig,unspecified site dx 1970s  . OA (osteoarthritis)    severe at neck  . Osteopenia   . Syncope 11-2012   admited . w/u (-)    Past Surgical History:  Procedure Laterality Date  . APPENDECTOMY    . BELPHAROPTOSIS REPAIR Bilateral 2017  . CATARACT EXTRACTION Right 2020  . CHOLECYSTECTOMY  02/24/06  . LEFT HEART CATH AND CORONARY ANGIOGRAPHY N/A 07/20/2018   Procedure: LEFT HEART CATH AND CORONARY ANGIOGRAPHY;  Surgeon: Belva Crome, MD;  Location: [facility];  Service: Cardiovascular;  Laterality: N/A;  . THYROIDECTOMY    . TONSILLECTOMY AND ADENOIDECTOMY  1949  . TUBAL LIGATION  1972    Social History   Socioeconomic History  . Marital status: Married    Spouse name: Iona Beard  . Number of children: 3  . Years of education: Not on file  . Highest education level: Not on file  Occupational History  . Occupation: retired  Scientific laboratory technician  . Financial resource strain: Not on file  . Food insecurity    Worry: Not on file    Inability: Not on file  . Transportation needs    Medical: Not on file    Non-medical: Not on file  Tobacco Use  . Smoking  status: Never Smoker  . Smokeless tobacco: Never Used  Substance and Sexual Activity  . Alcohol use: No    Alcohol/week: 0.0 standard drinks  . Drug use: No  . Sexual activity: Not Currently    Partners: Male    Birth control/protection: Post-menopausal  Lifestyle  . Physical activity    Days per week: Not on file    Minutes per session: Not on file  . Stress: Not on file  Relationships  . Social Herbalist on phone: Not on file    Gets together: Not on file    Attends religious service: Not on file    Active member of club or organization: Not on file    Attends meetings of clubs or organizations: Not on file    Relationship status: Not on file  . Intimate partner violence    Fear of current or ex partner: Not on file    Emotionally abused: Not on file    Physically abused: Not on file    Forced sexual activity: Not on file  Other Topics Concern  . Not on file  Social History Narrative   3 kids, 8 GK   Lives w/ husband   Lost a g-child to suicide 2017  Family History  Problem Relation Age of Onset  . Stroke Mother 64  . Coronary artery disease Father        MI age 86  . Deep vein thrombosis Brother        PE at age 85  . Heart failure Sister   . Ovarian cancer Sister   . Diabetes Neg Hx   . Colon cancer Neg Hx   . Breast cancer Neg Hx      Allergies as of 05/10/2019      Reactions   Codeine Nausea And Vomiting      Medication List       Accurate as of May 10, 2019 11:59 PM. If you have any questions, ask your nurse or doctor.        acetaminophen 500 MG tablet Commonly known as: TYLENOL Take 500 mg by mouth every 6 (six) hours as needed for mild pain.   aspirin 81 MG tablet Take 81 mg by mouth daily.   atorvastatin 40 MG tablet Commonly known as: LIPITOR Take 1 tablet (40 mg total) by mouth at bedtime.   calcium-vitamin D 250-125 MG-UNIT tablet Commonly known as: OSCAL Take 1 tablet by mouth.   hydrochlorothiazide 25 MG  tablet Commonly known as: HYDRODIURIL Take 1 tablet (25 mg total) by mouth daily.   levothyroxine 112 MCG tablet Commonly known as: SYNTHROID Take 1 tablet (112 mcg total) by mouth daily before breakfast.   omeprazole 20 MG capsule Commonly known as: PRILOSEC Take 1 capsule (20 mg total) by mouth daily as needed (acid reflux).   Shingrix injection Generic drug: Zoster Vaccine Adjuvanted Inject 0.5 mLs into the muscle once for 1 dose. Started by: [name], MD   Tdap 5-2.5-18.5 LF-MCG/0.5 injection Commonly known as: BOOSTRIX Inject 0.5 mLs into the muscle once for 1 dose. Started by: [name], MD           Objective:   Physical Exam BP 123/75 (BP Location: Left Arm, Patient Position: Sitting, Cuff Size: Small)   Pulse 86   Temp 98 F (36.7 C) (Oral)   Resp 16   Ht 5' 5"  (1.651 m)   Wt 164 lb (74.4 kg)   SpO2 96%   BMI 27.29 kg/m  General: Well developed, NAD, BMI noted Neck: No  thyromegaly  HEENT:  Normocephalic . Face symmetric, atraumatic Lungs:  CTA B Normal respiratory effort, no intercostal retractions, no accessory muscle use. Heart: RRR,  no murmur.  No pretibial edema bilaterally  Abdomen:  Not distended, soft, non-tender. No rebound or rigidity.   Skin: Exposed areas without rash. Not pale. Not jaundice Neurologic:  alert & oriented X3.  Speech normal, gait appropriate for age and unassisted Strength symmetric and appropriate for age.  Psych: Cognition and judgment appear intact.  Cooperative with normal attention span and concentration.  Behavior appropriate. No anxious or depressed appearing.     Assessment     Assessment Prediabetes Hyperlipidemia HTN Hypothyroidism Carotid artery disease [place] 11-2014: 60-79% RICA stenosis. 80-03% LICA stenosis [place] 01/9178: 1-39 % B. 1 year [place] 06/2017 1-39%, no further routine [place] DJD, severe at the neck GERD, dysphagia remotely Osteopenia: -s/p fosamax 2007 to 2012. -T score -1.7 (01-2015)  -T  score -1.8 (04-2017) rx ca- vitamin D Lymphoma,  Neck, 1970s Syncope, admitted-2014, workup negative Shingles: 10/02/2016, left arm Chest pain: Cath 07/19/2018, negative, pericarditis?  PLAN: Prediabetes, check A1c Hyperlipidemia: on Lipitor, checking labs. HTN: Reports normal amb BPs, currently on HCTZ, BP today is  very good, check labs. Osteopenia, vitamin D deficiency: Encourage increased physical activity, continue vitamin D supplements, checking levels. Hypothyroidism: Checking TSH. RTC 6 months

## 2019-05-10 NOTE — Patient Instructions (Signed)
GO TO THE LAB : Get the blood work     GO TO THE FRONT DESK Schedule your next appointment   For a check up in 6 months

## 2019-05-11 ENCOUNTER — Encounter: Payer: Self-pay | Admitting: Internal Medicine

## 2019-05-11 MED ORDER — VITAMIN D (ERGOCALCIFEROL) 1.25 MG (50000 UNIT) PO CAPS: 50000.0000 [IU] | ORAL_CAPSULE | ORAL | 0 refills | Status: DC

## 2019-05-11 NOTE — Addendum Note (Signed)
Addended byDamita Dunnings D on: 05/11/2019 10:24 AM   Modules accepted: Orders

## 2019-05-11 NOTE — Assessment & Plan Note (Signed)
  Prediabetes, check A1c Hyperlipidemia: on Lipitor, checking labs. HTN: Reports normal amb BPs, currently on HCTZ, BP today is very good, check labs. Osteopenia, vitamin D deficiency: Encourage increased physical activity, continue vitamin D supplements, checking levels. Hypothyroidism: Checking TSH. RTC 6 months

## 2019-05-14 ENCOUNTER — Other Ambulatory Visit: Payer: Self-pay

## 2019-05-14 ENCOUNTER — Ambulatory Visit (HOSPITAL_BASED_OUTPATIENT_CLINIC_OR_DEPARTMENT_OTHER)
Admission: RE | Admit: 2019-05-14 | Discharge: 2019-05-14 | Disposition: A | Discharge status: Home / Self Care | Payer: PPO | Source: Ambulatory Visit | Attending: Internal Medicine | Admitting: Internal Medicine

## 2019-05-14 ENCOUNTER — Emergency Department (HOSPITAL_BASED_OUTPATIENT_CLINIC_OR_DEPARTMENT_OTHER)
Admission: EM | Admit: 2019-05-14 | Discharge: 2019-05-14 | Disposition: A | Discharge status: Home / Self Care | Payer: PPO

## 2019-05-14 DIAGNOSIS — Z9049 Acquired absence of other specified parts of digestive tract: Secondary | ICD-10-CM | POA: Diagnosis not present

## 2019-05-14 DIAGNOSIS — N281 Cyst of kidney, acquired: Secondary | ICD-10-CM | POA: Diagnosis not present

## 2019-05-14 DIAGNOSIS — R945 Abnormal results of liver function studies: Secondary | ICD-10-CM | POA: Insufficient documentation

## 2019-05-17 DIAGNOSIS — H25012 Cortical age-related cataract, left eye: Secondary | ICD-10-CM | POA: Diagnosis not present

## 2019-05-17 DIAGNOSIS — H2512 Age-related nuclear cataract, left eye: Secondary | ICD-10-CM | POA: Diagnosis not present

## 2019-05-17 DIAGNOSIS — H25812 Combined forms of age-related cataract, left eye: Secondary | ICD-10-CM | POA: Diagnosis not present

## 2019-05-18 NOTE — Addendum Note (Signed)
Addended byDamita Dunnings D on: 05/18/2019 03:59 PM   Modules accepted: Orders

## 2019-05-27 ENCOUNTER — Encounter: Payer: Self-pay | Admitting: Internal Medicine

## 2019-05-30 NOTE — Progress Notes (Addendum)
Virtual Visit via Video Note  I connected with patient on 05/31/19 at  1:00 PM EDT by audio enabled telemedicine application and verified that I am speaking with the correct person using two identifiers.   THIS ENCOUNTER IS A VIRTUAL VISIT DUE TO COVID-19 - PATIENT WAS NOT SEEN IN THE OFFICE. PATIENT HAS CONSENTED TO VIRTUAL VISIT / TELEMEDICINE VISIT   Location of patient: home  Location of provider: office  I discussed the limitations of evaluation and management by telemedicine and the availability of in person appointments. The patient expressed understanding and agreed to proceed.   Subjective:   Angela Clay is a 85 y.o. female who presents for Medicare Annual (Subsequent) preventive examination.  Review of Systems: No ROS.  Medicare Wellness Virtual Visit.  Visual/audio telehealth visit, UTA vital signs.   See social history for additional risk factors.   Sleep patterns: no issues Home Safety/Smoke Alarms: Feels safe in home. Smoke alarms in place.  Lives with husband. No stairs. Step over tub with grab rails.   Female:         Mammo-11/19/18       Dexa scan-   05/14/17         Objective:     Vitals: BP 128/82 Comment: pt reported vital  Pulse 81    Advanced Directives 05/31/2019 07/20/2018 07/19/2018 05/25/2018 05/11/2017 12/01/2012  Does Patient Have a Medical Advance Directive? Yes Yes Yes Yes Yes Patient has advance directive, copy not in chart  Type of Advance Directive Living will [place];Living will [place];Living will Living will Living will -  Does patient want to make changes to medical advance directive? No - Patient declined No - Patient declined - No - Patient declined - -  Copy of [place] in Chart? - No - copy requested - - - Copy requested from family    Tobacco Social History   Tobacco Use  Smoking Status Never Smoker  Smokeless Tobacco Never Used     Counseling given: Not Answered    Clinical Intake: Pain : No/denies pain    Past Medical History:  Diagnosis Date  . Actinic keratosis 05/2015   Right mid central dorsal hand  . GERD (gastroesophageal reflux disease)    EGD for dysphagia 2000 aprox (-)  . History of radiation therapy   . Hyperlipidemia   . Hypertension   . Hypothyroidism   . Lymphoma ([place])    neck,mlig,unspecified site dx 1970s  . OA (osteoarthritis)    severe at neck  . Osteopenia   . Syncope 11-2012   admited . w/u (-)   Past Surgical History:  Procedure Laterality Date  . APPENDECTOMY    . BELPHAROPTOSIS REPAIR Bilateral 2017  . CATARACT EXTRACTION Right 2020  . CHOLECYSTECTOMY  02/24/06  . EYE SURGERY Bilateral 01/10/2019   Dr.Rankin  . LEFT HEART CATH AND CORONARY ANGIOGRAPHY N/A 07/20/2018   Procedure: LEFT HEART CATH AND CORONARY ANGIOGRAPHY;  Surgeon: Belva Crome, MD;  Location: [facility];  Service: Cardiovascular;  Laterality: N/A;  . THYROIDECTOMY    . TONSILLECTOMY AND ADENOIDECTOMY  1949  . TUBAL LIGATION  1972   Family History  Problem Relation Age of Onset  . Stroke Mother 82  . Coronary artery disease Father        MI age 30  . Deep vein thrombosis Brother        PE at age 64  . Heart failure Sister   .  Ovarian cancer Sister   . Diabetes Neg Hx   . Colon cancer Neg Hx   . Breast cancer Neg Hx    Social History   Socioeconomic History  . Marital status: Married    Spouse name: Iona Beard  . Number of children: 3  . Years of education: Not on file  . Highest education level: Not on file  Occupational History  . Occupation: retired  Scientific laboratory technician  . Financial resource strain: Not on file  . Food insecurity    Worry: Not on file    Inability: Not on file  . Transportation needs    Medical: Not on file    Non-medical: Not on file  Tobacco Use  . Smoking status: Never Smoker  . Smokeless tobacco: Never Used  Substance and Sexual Activity  . Alcohol use: No    Alcohol/week: 0.0 standard drinks  .  Drug use: No  . Sexual activity: Not Currently    Partners: Male    Birth control/protection: Post-menopausal  Lifestyle  . Physical activity    Days per week: Not on file    Minutes per session: Not on file  . Stress: Not on file  Relationships  . Social Herbalist on phone: Not on file    Gets together: Not on file    Attends religious service: Not on file    Active member of club or organization: Not on file    Attends meetings of clubs or organizations: Not on file    Relationship status: Not on file  Other Topics Concern  . Not on file  Social History Narrative   3 kids, 8 GK   Lives w/ husband   Lost a g-child to suicide 2017       Outpatient Encounter Medications as of 05/31/2019  Medication Sig  . acetaminophen (TYLENOL) 500 MG tablet Take 500 mg by mouth every 6 (six) hours as needed for mild pain.   Marland Kitchen aspirin 81 MG tablet Take 81 mg by mouth daily.    Marland Kitchen atorvastatin (LIPITOR) 40 MG tablet Take 1 tablet (40 mg total) by mouth at bedtime.  . hydrochlorothiazide (HYDRODIURIL) 25 MG tablet Take 1 tablet (25 mg total) by mouth daily.  Marland Kitchen levothyroxine (SYNTHROID) 112 MCG tablet Take 1 tablet (112 mcg total) by mouth daily before breakfast.  . omeprazole (PRILOSEC) 20 MG capsule Take 1 capsule (20 mg total) by mouth daily as needed (acid reflux).  . Vitamin D, Ergocalciferol, (DRISDOL) 1.25 MG (50000 UT) CAPS capsule Take 1 capsule (50,000 Units total) by mouth every 7 (seven) days.  . calcium-vitamin D (OSCAL) 250-125 MG-UNIT per tablet Take 1 tablet by mouth.    No facility-administered encounter medications on file as of 05/31/2019.     Activities of Daily Living In your present state of health, do you have any difficulty performing the following activities: 07/20/2018  Hearing? N  Vision? N  Difficulty concentrating or making decisions? N  Walking or climbing stairs? N  Dressing or bathing? N  Doing errands, shopping? N  Some recent data might be hidden     Patient Care Team: Colon Branch, MD as PCP - Cyndia Diver, MD as PCP - Cardiology (Cardiology) Jerrell Belfast, MD as Consulting Physician (Otolaryngology) [name], Amy, MD as Consulting Physician (Dermatology)    Assessment:   This is a routine wellness examination for Myrth. Physical assessment deferred to PCP.  Exercise Activities and Dietary recommendations   Diet (meal preparation, eat  out, water intake, caffeinated beverages, dairy products, fruits and vegetables): in general, a "healthy" diet  , well balanced   Goals    . maintain healthy active lifestyle (pt-stated)    . Maintain healthy lifestyle (pt-stated)       Fall Risk Fall Risk  05/31/2019 05/25/2018 05/11/2017 07/07/2016 01/10/2016  Falls in the past year? 0 No No No No     Depression Screen PHQ 2/9 Scores 05/31/2019 05/25/2018 05/11/2017 07/07/2016  PHQ - 2 Score 0 0 0 0     Cognitive Function Ad8 score reviewed for issues:  Issues making decisions:no  Less interest in hobbies / activities:no  Repeats questions, stories (family complaining):no  Trouble using ordinary gadgets (microwave, computer, phone):no  Forgets the month or year: no  Mismanaging finances: no  Remembering appts:no  Daily problems with thinking and/or memory:no Ad8 score is=0     MMSE - Mini Mental State Exam 05/11/2017  Orientation to time 5  Orientation to Place 5  Registration 3  Attention/ Calculation 5  Recall 2  Language- name 2 objects 2  Language- repeat 1  Language- follow 3 step command 2  Language- read & follow direction 1  Write a sentence 1  Copy design 1  Total score 28        Immunization History  Administered Date(s) Administered  . Influenza Split 10/01/2011  . Influenza Whole 08/25/2007, 09/08/2008, 08/03/2009, 08/20/2010  . Influenza, High Dose Seasonal PF 07/03/2015, 07/07/2016, 07/16/2017, 07/21/2018  . Influenza, Seasonal, Injecte, Preservative Fre 10/11/2012  . Influenza,inj,Quad  PF,6+ Mos 10/18/2013, 09/19/2014  . Pneumococcal Conjugate-13 01/02/2014  . Pneumococcal Polysaccharide-23 10/27/2000, 08/25/2007, 01/15/2018  . Td 10/27/1997, 08/30/2008  . Zoster 03/04/2013    Screening Tests Health Maintenance  Topic Date Due  . TETANUS/TDAP  08/30/2018  . INFLUENZA VACCINE  05/28/2019  . DEXA SCAN  Completed  . PNA vac Low Risk Adult  Completed      Plan:   See you next year!  Continue to eat heart healthy diet (full of fruits, vegetables, whole grains, lean protein, water--limit salt, fat, and sugar intake) and increase physical activity as tolerated.  Continue doing brain stimulating activities (puzzles, reading, adult coloring books, staying active) to keep memory sharp.    I have personally reviewed and noted the following in the patient's chart:   . Medical and social history . Use of alcohol, tobacco or illicit drugs  . Current medications and supplements . Functional ability and status . Nutritional status . Physical activity . Advanced directives . List of other physicians . Hospitalizations, surgeries, and ER visits in previous 12 months . Vitals . Screenings to include cognitive, depression, and falls . Referrals and appointments  In addition, I have reviewed and discussed with patient certain preventive protocols, quality metrics, and best practice recommendations. A written personalized care plan for preventive services as well as general preventive health recommendations were provided to patient.     Naaman Plummer Val[place], [place]  05/31/2019  [name], MD

## 2019-05-31 ENCOUNTER — Encounter: Payer: Self-pay | Admitting: *Deleted

## 2019-05-31 ENCOUNTER — Other Ambulatory Visit: Payer: Self-pay

## 2019-05-31 ENCOUNTER — Ambulatory Visit (INDEPENDENT_AMBULATORY_CARE_PROVIDER_SITE_OTHER): Payer: PPO | Admitting: *Deleted

## 2019-05-31 VITALS — BP 128/82 | HR 81

## 2019-05-31 DIAGNOSIS — Z0000 Encounter for general adult medical examination without abnormal findings: Secondary | ICD-10-CM | POA: Diagnosis not present

## 2019-05-31 NOTE — Patient Instructions (Signed)
See you next year!  Continue to eat heart healthy diet (full of fruits, vegetables, whole grains, lean protein, water--limit salt, fat, and sugar intake) and increase physical activity as tolerated.  Continue doing brain stimulating activities (puzzles, reading, adult coloring books, staying active) to keep memory sharp.    Angela Clay , Thank you for taking time to come for your Medicare Wellness Visit. I appreciate your ongoing commitment to your health goals. Please review the following plan we discussed and let me know if I can assist you in the future.   These are the goals we discussed: Goals    . maintain healthy active lifestyle (pt-stated)       This is a list of the screening recommended for you and due dates:  Health Maintenance  Topic Date Due  . Tetanus Vaccine  08/30/2018  . Flu Shot  05/28/2019  . DEXA scan (bone density measurement)  Completed  . Pneumonia vaccines  Completed    Health Maintenance After Age 70 After age 76, you are at a higher risk for certain long-term diseases and infections as well as injuries from falls. Falls are a major cause of broken bones and head injuries in people who are older than age 18. Getting regular preventive care can help to keep you healthy and well. Preventive care includes getting regular testing and making lifestyle changes as recommended by your health care provider. Talk with your health care provider about:  Which screenings and tests you should have. A screening is a test that checks for a disease when you have no symptoms.  A diet and exercise plan that is right for you. What should I know about screenings and tests to prevent falls? Screening and testing are the best ways to find a health problem early. Early diagnosis and treatment give you the best chance of managing medical conditions that are common after age 35. Certain conditions and lifestyle choices may make you more likely to have a fall. Your health care provider  may recommend:  Regular vision checks. Poor vision and conditions such as cataracts can make you more likely to have a fall. If you wear glasses, make sure to get your prescription updated if your vision changes.  Medicine review. Work with your health care provider to regularly review all of the medicines you are taking, including over-the-counter medicines. Ask your health care provider about any side effects that may make you more likely to have a fall. Tell your health care provider if any medicines that you take make you feel dizzy or sleepy.  Osteoporosis screening. Osteoporosis is a condition that causes the bones to get weaker. This can make the bones weak and cause them to break more easily.  Blood pressure screening. Blood pressure changes and medicines to control blood pressure can make you feel dizzy.  Strength and balance checks. Your health care provider may recommend certain tests to check your strength and balance while standing, walking, or changing positions.  Foot health exam. Foot pain and numbness, as well as not wearing proper footwear, can make you more likely to have a fall.  Depression screening. You may be more likely to have a fall if you have a fear of falling, feel emotionally low, or feel unable to do activities that you used to do.  Alcohol use screening. Using too much alcohol can affect your balance and may make you more likely to have a fall. What actions can I take to lower my risk of falls?  General instructions  Talk with your health care provider about your risks for falling. Tell your health care provider if: ? You fall. Be sure to tell your health care provider about all falls, even ones that seem minor. ? You feel dizzy, sleepy, or off-balance.  Take over-the-counter and prescription medicines only as told by your health care provider. These include any supplements.  Eat a healthy diet and maintain a healthy weight. A healthy diet includes low-fat  dairy products, low-fat (lean) meats, and fiber from whole grains, beans, and lots of fruits and vegetables. Home safety  Remove any tripping hazards, such as rugs, cords, and clutter.  Install safety equipment such as grab bars in bathrooms and safety rails on stairs.  Keep rooms and walkways well-lit. Activity   Follow a regular exercise program to stay fit. This will help you maintain your balance. Ask your health care provider what types of exercise are appropriate for you.  If you need a cane or walker, use it as recommended by your health care provider.  Wear supportive shoes that have nonskid soles. Lifestyle  Do not drink alcohol if your health care provider tells you not to drink.  If you drink alcohol, limit how much you have: ? 0-1 drink a day for women. ? 0-2 drinks a day for men.  Be aware of how much alcohol is in your drink. In the [place] one drink equals one typical bottle of beer (12 oz), one-half glass of wine (5 oz), or one shot of hard liquor (1 oz).  Do not use any products that contain nicotine or tobacco, such as cigarettes and e-cigarettes. If you need help quitting, ask your health care provider. Summary  Having a healthy lifestyle and getting preventive care can help to protect your health and wellness after age 68.  Screening and testing are the best way to find a health problem early and help you avoid having a fall. Early diagnosis and treatment give you the best chance for managing medical conditions that are more common for people who are older than age 28.  Falls are a major cause of broken bones and head injuries in people who are older than age 73. Take precautions to prevent a fall at home.  Work with your health care provider to learn what changes you can make to improve your health and wellness and to prevent falls. This information is not intended to replace advice given to you by your health care provider. Make sure you discuss any questions  you have with your health care provider. Document Released: 08/26/2017 Document Revised: 02/03/2019 Document Reviewed: 08/26/2017 Elsevier Patient Education  2020 [employer].

## 2019-06-01 ENCOUNTER — Ambulatory Visit (INDEPENDENT_AMBULATORY_CARE_PROVIDER_SITE_OTHER): Payer: PPO | Admitting: Internal Medicine

## 2019-06-01 ENCOUNTER — Encounter: Payer: Self-pay | Admitting: Internal Medicine

## 2019-06-01 ENCOUNTER — Ambulatory Visit (HOSPITAL_BASED_OUTPATIENT_CLINIC_OR_DEPARTMENT_OTHER)
Admission: RE | Admit: 2019-06-01 | Discharge: 2019-06-01 | Disposition: A | Discharge status: Home / Self Care | Payer: PPO | Source: Ambulatory Visit | Attending: Internal Medicine | Admitting: Internal Medicine

## 2019-06-01 ENCOUNTER — Other Ambulatory Visit: Payer: Self-pay

## 2019-06-01 VITALS — BP 134/76 | HR 92 | Temp 97.8°F | Resp 16 | Ht 65.0 in | Wt 162.4 lb

## 2019-06-01 DIAGNOSIS — Z09 Encounter for follow-up examination after completed treatment for conditions other than malignant neoplasm: Secondary | ICD-10-CM

## 2019-06-01 DIAGNOSIS — R945 Abnormal results of liver function studies: Secondary | ICD-10-CM | POA: Diagnosis not present

## 2019-06-01 DIAGNOSIS — M109 Gout, unspecified: Secondary | ICD-10-CM | POA: Diagnosis not present

## 2019-06-01 DIAGNOSIS — M79641 Pain in right hand: Secondary | ICD-10-CM | POA: Insufficient documentation

## 2019-06-01 DIAGNOSIS — R7989 Other specified abnormal findings of blood chemistry: Secondary | ICD-10-CM

## 2019-06-01 LAB — COMPREHENSIVE METABOLIC PANEL
ALT: 40 U/L — ABNORMAL HIGH (ref 0–35)
AST: 47 U/L — ABNORMAL HIGH (ref 0–37)
Albumin: 4 g/dL (ref 3.5–5.2)
Alkaline Phosphatase: 256 U/L — ABNORMAL HIGH (ref 39–117)
BUN: 10 mg/dL (ref 6–23)
CO2: 27 mEq/L (ref 19–32)
Calcium: 9.6 mg/dL (ref 8.4–10.5)
Chloride: 97 mEq/L (ref 96–112)
Creatinine, Ser: 0.78 mg/dL (ref 0.40–1.20)
GFR: 70.45 mL/min (ref >60.00)
Glucose, Bld: 203 mg/dL — ABNORMAL HIGH (ref 70–99)
Potassium: 3.9 mEq/L (ref 3.5–5.1)
Sodium: 132 mEq/L — ABNORMAL LOW (ref 135–145)
Total Bilirubin: 0.8 mg/dL (ref 0.2–1.2)
Total Protein: 8.7 g/dL — ABNORMAL HIGH (ref 6.0–8.3)

## 2019-06-01 LAB — URIC ACID: Uric Acid, Serum: 8.1 mg/dL — ABNORMAL HIGH (ref 2.4–7.0)

## 2019-06-01 MED ORDER — COLCHICINE 0.6 MG PO CAPS: 1.0000 | ORAL_CAPSULE | Freq: Two times a day (BID) | ORAL | 0 refills | Status: DC | PRN

## 2019-06-01 NOTE — Patient Instructions (Signed)
GO TO THE LAB : Get the blood work     STOP BY THE FIRST FLOOR:  get the XR   For pain: Colchicine twice a day until better, prescription sent Ice the area twice a day  Call if not gradually better

## 2019-06-01 NOTE — Progress Notes (Signed)
Pre visit review using our clinic review tool, if applicable. No additional management support is needed unless otherwise documented below in the visit note. 

## 2019-06-01 NOTE — Progress Notes (Signed)
Subjective:    Patient ID: Angela Clay, female    DOB: [birth year], 85 y.o.   MRN: [id number]  DOS:  06/01/2019 Type of visit - description: acute  Symptoms started 2 weeks ago: Acute onset of pain, swelling warmness of the right wrist, right hand (knuckles : second, third and fourth) Overall symptoms are decreasing with remaining pain and swelling of the knuckles. Has been using ice or heat  Increased LFTs: See last labs, she does not drink alcohol, she was taking Tylenol 1 or 2 times a day.  Increased calcium: Needs to recheck labs  Review of Systems Denies fever chills No other joints involved No injury When asked, admits to a remote history of gout.  Past Medical History:  Diagnosis Date  . Actinic keratosis 05/2015   Right mid central dorsal hand  . GERD (gastroesophageal reflux disease)    EGD for dysphagia 2000 aprox (-)  . History of radiation therapy   . Hyperlipidemia   . Hypertension   . Hypothyroidism   . Lymphoma ([place])    neck,mlig,unspecified site dx 1970s  . OA (osteoarthritis)    severe at neck  . Osteopenia   . Syncope 11-2012   admited . w/u (-)    Past Surgical History:  Procedure Laterality Date  . APPENDECTOMY    . BELPHAROPTOSIS REPAIR Bilateral 2017  . CATARACT EXTRACTION Right 2020  . CHOLECYSTECTOMY  02/24/06  . EYE SURGERY Bilateral 01/10/2019   Dr.Rankin  . LEFT HEART CATH AND CORONARY ANGIOGRAPHY N/A 07/20/2018   Procedure: LEFT HEART CATH AND CORONARY ANGIOGRAPHY;  Surgeon: Belva Crome, MD;  Location: [facility];  Service: Cardiovascular;  Laterality: N/A;  . THYROIDECTOMY    . TONSILLECTOMY AND ADENOIDECTOMY  1949  . TUBAL LIGATION  1972    Social History   Socioeconomic History  . Marital status: Married    Spouse name: Iona Beard  . Number of children: 3  . Years of education: Not on file  . Highest education level: Not on file  Occupational History  . Occupation: retired  Scientific laboratory technician  . Financial resource strain:  Not on file  . Food insecurity    Worry: Not on file    Inability: Not on file  . Transportation needs    Medical: Not on file    Non-medical: Not on file  Tobacco Use  . Smoking status: Never Smoker  . Smokeless tobacco: Never Used  Substance and Sexual Activity  . Alcohol use: No    Alcohol/week: 0.0 standard drinks  . Drug use: No  . Sexual activity: Not Currently    Partners: Male    Birth control/protection: Post-menopausal  Lifestyle  . Physical activity    Days per week: Not on file    Minutes per session: Not on file  . Stress: Not on file  Relationships  . Social Herbalist on phone: Not on file    Gets together: Not on file    Attends religious service: Not on file    Active member of club or organization: Not on file    Attends meetings of clubs or organizations: Not on file    Relationship status: Not on file  . Intimate partner violence    Fear of current or ex partner: Not on file    Emotionally abused: Not on file    Physically abused: Not on file    Forced sexual activity: Not on file  Other Topics Concern  .  Not on file  Social History Narrative   3 kids, 8 GK   Lives w/ husband   Lost a g-child to suicide 2017         Allergies as of 06/01/2019      Reactions   Codeine Nausea And Vomiting      Medication List       Accurate as of June 01, 2019 11:59 PM. If you have any questions, ask your nurse or doctor.        acetaminophen 500 MG tablet Commonly known as: TYLENOL Take 500 mg by mouth every 6 (six) hours as needed for mild pain.   aspirin 81 MG tablet Take 81 mg by mouth daily.   atorvastatin 40 MG tablet Commonly known as: LIPITOR Take 1 tablet (40 mg total) by mouth at bedtime.   calcium-vitamin D 250-125 MG-UNIT tablet Commonly known as: OSCAL Take 1 tablet by mouth.   Colchicine 0.6 MG Caps Commonly known as: Mitigare Take 1 capsule by mouth 2 (two) times daily as needed. Started by: [name], MD    hydrochlorothiazide 25 MG tablet Commonly known as: HYDRODIURIL Take 1 tablet (25 mg total) by mouth daily.   levothyroxine 112 MCG tablet Commonly known as: SYNTHROID Take 1 tablet (112 mcg total) by mouth daily before breakfast.   omeprazole 20 MG capsule Commonly known as: PRILOSEC Take 1 capsule (20 mg total) by mouth daily as needed (acid reflux).   predniSONE 10 MG tablet Commonly known as: DELTASONE 3 tabs x 2 days, 2 tabs x 2 days, 1 tab x2 days Started by: [name], MD   Vitamin D (Ergocalciferol) 1.25 MG (50000 UT) Caps capsule Commonly known as: DRISDOL Take 1 capsule (50,000 Units total) by mouth every 7 (seven) days.           Objective:   Physical Exam BP 134/76 (BP Location: Left Arm, Patient Position: Sitting, Cuff Size: Small)   Pulse 92   Temp 97.8 F (36.6 C) (Oral)   Resp 16   Ht 5' 5"  (1.651 m)   Wt 162 lb 6 oz (73.7 kg)   SpO2 99%   BMI 27.02 kg/m   General:   Well developed, NAD, BMI noted. HEENT:  Normocephalic . Face symmetric, atraumatic MSK: Left wrist and hand: Changes with DJD with no synovitis Right wrist: Changes of DJD without synovitis, range of motion normal Right hand: + Puffiness, mild tenderness and warmness at the second and third knuckle.  Otherwise exam is benign Skin: Not pale. Not jaundice Neurologic:  alert & oriented X3.  Speech normal, gait appropriate for age and unassisted Psych--  Cognition and judgment appear intact.  Cooperative with normal attention span and concentration.  Behavior appropriate. No anxious or depressed appearing.      Assessment     Assessment Prediabetes Hyperlipidemia HTN Hypothyroidism Carotid artery disease [place] 11-2014: 60-79% RICA stenosis. 89-37% LICA stenosis [place] 12/4285: 1-39 % B. 1 year [place] 06/2017 1-39%, no further routine [place] DJD, severe at the neck GERD, dysphagia remotely Osteopenia: -s/p fosamax 2007 to 2012. -T score -1.7 (01-2015)  -T score -1.8 (04-2017) rx ca-  vitamin D Lymphoma,  Neck, 1970s Syncope, admitted-2014, workup negative Shingles: 10/02/2016, left arm Chest pain: Cath 07/19/2018, negative, pericarditis?  PLAN: Suspect gout: Right wrist and hand swelling for 2 weeks, spontaneously getting better, on further questioning, 10 years ago she had an episode of gout at the foot.  Suspect that is the culprit of her symptoms. Plan: CMP,  uric acid, x-ray hand, start colchicine, based on results consider prednisone. Increased LFTs: See last labs, LFTs were increased, ultrasound 04-2019 showed fatty liver.  Does not drink ETOH; was taking 1 or 2 Tylenols daily.  Since then she is stopped. Rechecking labs Increased serum protein, will check SPEP (error, no h/o elevated Ca+).   Addendum: -X-ray of the hands with changes of DJD, uric acid elevated.  Add prednisone for the treatment of gout -Increased LFTs continue in a cholestatic pattern, will add antimitochondrial antibody, hep B and C for completeness, refer to GI.    All of the above discussed with the patient.

## 2019-06-02 ENCOUNTER — Encounter: Payer: Self-pay | Admitting: Gastroenterology

## 2019-06-02 ENCOUNTER — Other Ambulatory Visit: Payer: Self-pay

## 2019-06-02 ENCOUNTER — Other Ambulatory Visit: Payer: PPO

## 2019-06-02 DIAGNOSIS — R7989 Other specified abnormal findings of blood chemistry: Secondary | ICD-10-CM

## 2019-06-02 DIAGNOSIS — R945 Abnormal results of liver function studies: Secondary | ICD-10-CM | POA: Diagnosis not present

## 2019-06-02 MED ORDER — PREDNISONE 10 MG PO TABS: ORAL_TABLET | ORAL | 0 refills | Status: DC

## 2019-06-02 NOTE — Assessment & Plan Note (Addendum)
  Suspect gout: Right wrist and hand swelling for 2 weeks, spontaneously getting better, on further questioning, 10 years ago she had an episode of gout at the foot.  Suspect that is the culprit of her symptoms. Plan: CMP, uric acid, x-ray hand, start colchicine, based on results consider prednisone. Increased LFTs: See last labs, LFTs were increased, ultrasound 04-2019 showed fatty liver.  Does not drink ETOH; was taking 1 or 2 Tylenols daily.  Since then she is stopped. Rechecking labs Increased serum protein, will check SPEP (error, no h/o elevated Ca+).   Addendum: -X-ray of the hands with changes of DJD, uric acid elevated.  Add prednisone for the treatment of gout -Increased LFTs continue in a cholestatic pattern, will add antimitochondrial antibody, hep B and C for completeness, refer to GI.    All of the above discussed with the patient.

## 2019-06-03 LAB — HEPATITIS B SURFACE ANTIGEN: Hepatitis B Surface Ag: NONREACTIVE

## 2019-06-03 LAB — MITOCHONDRIAL ANTIBODIES: Mitochondrial M2 Ab, IgG: <=20 U

## 2019-06-03 LAB — HEPATITIS C ANTIBODY
Hepatitis C Ab: NONREACTIVE
SIGNAL TO CUT-OFF: 0.27 (ref <1.00)

## 2019-06-03 LAB — HEPATITIS B CORE ANTIBODY, TOTAL: Hep B Core Total Ab: NONREACTIVE

## 2019-06-06 LAB — CALCIUM, IONIZED: Calcium, Ion: 5.1 mg/dL (ref 4.8–5.6)

## 2019-06-06 LAB — PROTEIN ELECTROPHORESIS, SERUM
Albumin ELP: 3.8 g/dL (ref 3.8–4.8)
Alpha 1: 0.4 g/dL — ABNORMAL HIGH (ref 0.2–0.3)
Alpha 2: 1 g/dL — ABNORMAL HIGH (ref 0.5–0.9)
Beta 2: 0.4 g/dL (ref 0.2–0.5)
Beta Globulin: 0.6 g/dL (ref 0.4–0.6)
Gamma Globulin: 2.5 g/dL — ABNORMAL HIGH (ref 0.8–1.7)
Total Protein: 8.8 g/dL — ABNORMAL HIGH (ref 6.1–8.1)

## 2019-06-06 LAB — TEST AUTHORIZATION

## 2019-06-14 ENCOUNTER — Telehealth: Payer: Self-pay | Admitting: Hematology

## 2019-06-14 ENCOUNTER — Telehealth: Payer: Self-pay | Admitting: Internal Medicine

## 2019-06-14 DIAGNOSIS — R778 Other specified abnormalities of plasma proteins: Secondary | ICD-10-CM

## 2019-06-14 NOTE — Telephone Encounter (Signed)
Referral placed. Lab appt cancelled.

## 2019-06-14 NOTE — Telephone Encounter (Signed)
Called and spoke with patient regarding NP appointment date/time/location per 8/18 referral message

## 2019-06-14 NOTE — Telephone Encounter (Signed)
Spoke with the patient today, gout symptoms much improved.  Still have some soreness there. Okay to take colchicine as needed if pain/swelling/redness return. Her SPEP was abnormal.  Please enter a hematology referral DX abnormal SPEP  Also cancel this week's  lab appointment at this office.

## 2019-06-17 ENCOUNTER — Other Ambulatory Visit: Payer: PPO

## 2019-06-20 ENCOUNTER — Encounter: Payer: Self-pay | Admitting: Internal Medicine

## 2019-06-22 ENCOUNTER — Other Ambulatory Visit: Payer: Self-pay | Admitting: Hematology

## 2019-06-22 DIAGNOSIS — R778 Other specified abnormalities of plasma proteins: Secondary | ICD-10-CM | POA: Insufficient documentation

## 2019-06-22 NOTE — Progress Notes (Signed)
[facility] NOTE  Patient Care Team: Colon Branch, MD as PCP - Cyndia Diver, MD as PCP - Cardiology (Cardiology) Jerrell Belfast, MD as Consulting Physician (Otolaryngology) [name], Amy, MD as Consulting Physician (Dermatology)  HEME/ONC OVERVIEW: 1. Abnormal SPEP -05/2019: SPEP showed "one or more serum protein fractions outside normal range but no abnormal protein bands identified"   ASSESSMENT & PLAN:   Abnormal SPEP -I reviewed the patient's records in detail, including PCP clinic notes and lab studies -In summary, patient presented to PCP in early 05/2019 for acute swelling and pain of the right wrist and hand, suspicious for acute gout flare.  Her total serum protein level was mildly elevated, and SPEP was checked, which showed "1 or more serum protein fractions outside the normal range, but no abnormal protein bands identified."  No IFE or free light chains were checked.  Patient was referred to hematology for further evaluation. -I personally reviewed the patient's peripheral blood smear today.  The red blood cells were of normal morphology.  There was no schistocytosis.  The white blood cells were of normal morphology. There were no peripheral circulating blasts. The platelets were of normal size and I verified that there were no platelet clumping. -I reviewed the lab results in detail with the patient, as well as the pathophysiology of plasma cell proliferation and dyscrasia, and some of the associated laboratory abnormalities  -Given that the recent SPEP was incomplete, I have ordered repeat SPEP, with IFE and free light chains to further assess any abnormal monoclonal protein -As her recent SPEP did not identify any abnormal protein bands, the "one or more serum protein fractions outside the normal range" were most likely due to polyclonal in the setting of acute inflammation in the setting of gout flare -If her SPEP, IFE, and free light chains do  not identify any suspicious monoclonal protein, then no further work-up is indicated to assess any monoclonal gammopathy  Hypokalemia  -Likely secondary to HCTZ -K 3.2 today, patient is asymptomatic -I have prescribed KCl 28mq daily, and instructed the patient to follow up with her PCP for further management   Elevated LFT's -Etiology unclear -Alk phos improving; AST/ALT normalized -Patient has follow-up with GI on 07/05/2019; I encouraged the patient to keep the appt as scheduled   Orders Placed This Encounter  Procedures  . CBC with Differential (Cancer Center Only)    Standing Status:   Future    Standing Expiration Date:   08/01/2020  . CMP ([facility])    Standing Status:   Future    Standing Expiration Date:   08/01/2020   All questions were answered. The patient knows to call the clinic with any problems, questions or concerns.  Return in 2-3 weeks for labs and clinic follow-up.   YTish Men MD 06/28/2019 1:24 PM   CHIEF COMPLAINTS/PURPOSE OF CONSULTATION:  "My hands are much better"  HISTORY OF PRESENTING ILLNESS:  Angela Clay 85y.o. female is here because of questionable SPEP.  Patient presented to PCP in early 05/2019 for acute swelling and pain of the right wrist and hand, suspicious for acute gout flare.  Her total serum protein level was mildly elevated, and SPEP was checked, which showed "1 or more serum protein fractions outside the normal range, but no abnormal protein bands identified."  No IFE or free light chains were checked.  Patient was referred to hematology for further evaluation.  Patient reports that since her recent visit with  her PCP, the pain in her hand joints has improved significantly, and she takes colchicine occasionally for the joint pain. She otherwise feels well and denies any complaint, such as constitutional symptoms, chest pain, dyspnea, abdominal pain, abnormal bleeding/bruising, or unusual bone pain.   MEDICAL HISTORY:  Past  Medical History:  Diagnosis Date  . Actinic keratosis 05/2015   Right mid central dorsal hand  . GERD (gastroesophageal reflux disease)    EGD for dysphagia 2000 aprox (-)  . History of radiation therapy   . Hyperlipidemia   . Hypertension   . Hypothyroidism   . Lymphoma ([place])    neck,mlig,unspecified site dx 1970s  . OA (osteoarthritis)    severe at neck  . Osteopenia   . Syncope 11-2012   admited . w/u (-)    SURGICAL HISTORY: Past Surgical History:  Procedure Laterality Date  . APPENDECTOMY    . BELPHAROPTOSIS REPAIR Bilateral 2017  . CATARACT EXTRACTION Right 2020  . CHOLECYSTECTOMY  02/24/06  . EYE SURGERY Bilateral 01/10/2019   Dr.Rankin  . LEFT HEART CATH AND CORONARY ANGIOGRAPHY N/A 07/20/2018   Procedure: LEFT HEART CATH AND CORONARY ANGIOGRAPHY;  Surgeon: Belva Crome, MD;  Location: [facility];  Service: Cardiovascular;  Laterality: N/A;  . THYROIDECTOMY    . TONSILLECTOMY AND ADENOIDECTOMY  1949  . TUBAL LIGATION  1972    SOCIAL HISTORY: Social History   Socioeconomic History  . Marital status: Married    Spouse name: Iona Beard  . Number of children: 3  . Years of education: Not on file  . Highest education level: Not on file  Occupational History  . Occupation: retired  Scientific laboratory technician  . Financial resource strain: Not on file  . Food insecurity    Worry: Not on file    Inability: Not on file  . Transportation needs    Medical: Not on file    Non-medical: Not on file  Tobacco Use  . Smoking status: Never Smoker  . Smokeless tobacco: Never Used  Substance and Sexual Activity  . Alcohol use: No    Alcohol/week: 0.0 standard drinks  . Drug use: No  . Sexual activity: Not Currently    Partners: Male    Birth control/protection: Post-menopausal  Lifestyle  . Physical activity    Days per week: Not on file    Minutes per session: Not on file  . Stress: Not on file  Relationships  . Social Herbalist on phone: Not on file    Gets  together: Not on file    Attends religious service: Not on file    Active member of club or organization: Not on file    Attends meetings of clubs or organizations: Not on file    Relationship status: Not on file  . Intimate partner violence    Fear of current or ex partner: Not on file    Emotionally abused: Not on file    Physically abused: Not on file    Forced sexual activity: Not on file  Other Topics Concern  . Not on file  Social History Narrative   3 kids, 8 GK   Lives w/ husband   Lost a g-child to suicide 2017       FAMILY HISTORY: Family History  Problem Relation Age of Onset  . Stroke Mother 52  . Coronary artery disease Father        MI age 28  . Deep vein thrombosis Brother  PE at age 32  . Heart failure Sister   . Ovarian cancer Sister   . Diabetes Neg Hx   . Colon cancer Neg Hx   . Breast cancer Neg Hx     ALLERGIES:  is allergic to codeine.  MEDICATIONS:  Current Outpatient Medications  Medication Sig Dispense Refill  . acetaminophen (TYLENOL) 500 MG tablet Take 500 mg by mouth every 6 (six) hours as needed for mild pain.     Marland Kitchen aspirin 81 MG tablet Take 81 mg by mouth daily.      Marland Kitchen atorvastatin (LIPITOR) 40 MG tablet Take 1 tablet (40 mg total) by mouth at bedtime. 90 tablet 3  . Colchicine (MITIGARE) 0.6 MG CAPS Take 1 capsule by mouth 2 (two) times daily as needed. 60 capsule 0  . hydrochlorothiazide (HYDRODIURIL) 25 MG tablet Take 1 tablet (25 mg total) by mouth daily. 90 tablet 1  . levothyroxine (SYNTHROID) 112 MCG tablet Take 1 tablet (112 mcg total) by mouth daily before breakfast. 90 tablet 1  . omeprazole (PRILOSEC) 20 MG capsule Take 1 capsule (20 mg total) by mouth daily as needed (acid reflux). 90 capsule 3  . Vitamin D, Ergocalciferol, (DRISDOL) 1.25 MG (50000 UT) CAPS capsule Take 1 capsule (50,000 Units total) by mouth every 7 (seven) days. 12 capsule 0  . potassium chloride SA (K-DUR) 20 MEQ tablet Take 1 tablet (20 mEq total) by  mouth daily. 30 tablet 3   No current facility-administered medications for this visit.     REVIEW OF SYSTEMS:   Constitutional: ( - ) fevers, ( - )  chills , ( - ) night sweats Eyes: ( - ) blurriness of vision, ( - ) double vision, ( - ) watery eyes Ears, nose, mouth, throat, and face: ( - ) mucositis, ( - ) sore throat Respiratory: ( - ) cough, ( - ) dyspnea, ( - ) wheezes Cardiovascular: ( - ) palpitation, ( - ) chest discomfort, ( - ) lower extremity swelling Gastrointestinal:  ( - ) nausea, ( - ) heartburn, ( - ) change in bowel habits Skin: ( - ) abnormal skin rashes Lymphatics: ( - ) new lymphadenopathy, ( - ) easy bruising Neurological: ( - ) numbness, ( - ) tingling, ( - ) new weaknesses Behavioral/Psych: ( - ) mood change, ( - ) new changes  All other systems were reviewed with the patient and are negative.  PHYSICAL EXAMINATION: ECOG PERFORMANCE STATUS: 1 - Symptomatic but completely ambulatory  Vitals:   06/28/19 1222  BP: 124/67  Pulse: 95  Resp: 19  Temp: (!) 97.1 F (36.2 C)  SpO2: 100%   Filed Weights   06/28/19 1222  Weight: 160 lb 6.4 oz (72.8 kg)    GENERAL: alert, no distress and comfortable SKIN: skin color, texture, turgor are normal, no rashes or significant lesions EYES: conjunctiva are pink and non-injected, sclera clear OROPHARYNX: no exudate, no erythema; lips, buccal mucosa, and tongue normal  NECK: supple, non-tender LYMPH:  no palpable lymphadenopathy in the cervical LUNGS: clear to auscultation with normal breathing effort HEART: regular rate & rhythm, no murmurs, no lower extremity edema ABDOMEN: soft, non-tender, non-distended, normal bowel sounds Musculoskeletal: no cyanosis of digits and no clubbing  PSYCH: alert & oriented x 3, fluent speech NEURO: no focal motor/sensory deficits  LABORATORY DATA:  I have reviewed the data as listed Lab Results  Component Value Date   WBC 6.2 06/28/2019   HGB 12.5 06/28/2019   HCT 39.4  06/28/2019   MCV 87.2 06/28/2019   PLT 255 06/28/2019   Lab Results  Component Value Date   NA 135 06/28/2019   K 3.2 (L) 06/28/2019   CL 96 (L) 06/28/2019   CO2 29 06/28/2019    RADIOGRAPHIC STUDIES: I have personally reviewed the radiological images as listed and agreed with the findings in the report. Dg Hand Complete Right  Result Date: 06/01/2019 CLINICAL DATA:  Right hand pain and swelling for the past 2 weeks, especially at the base of the index finger and at the wrist. No reported injury. EXAM: RIGHT HAND - COMPLETE 3+ VIEW COMPARISON:  None. FINDINGS: Mild to moderate spur formation involving the 2nd and 3rd PIP joints. Mild spur formation involving the 1st IP joint. Minimal spur formation involving the 2nd and 1st MCP joints. Dorsal soft tissue swelling at the MCP joints. IMPRESSION: Degenerative changes, as described above. Electronically Signed   By: Claudie Revering M.D.   On: 06/01/2019 17:10    PATHOLOGY: I have reviewed the pathology reports as documented in the oncologist history.

## 2019-06-28 ENCOUNTER — Other Ambulatory Visit: Payer: Self-pay

## 2019-06-28 ENCOUNTER — Inpatient Hospital Stay (HOSPITAL_BASED_OUTPATIENT_CLINIC_OR_DEPARTMENT_OTHER): Payer: PPO | Admitting: Hematology

## 2019-06-28 ENCOUNTER — Inpatient Hospital Stay: Payer: PPO | Attending: Hematology

## 2019-06-28 ENCOUNTER — Encounter: Payer: Self-pay | Admitting: Hematology

## 2019-06-28 VITALS — BP 124/67 | HR 95 | Temp 97.1°F | Resp 19 | Ht 64.0 in | Wt 160.4 lb

## 2019-06-28 DIAGNOSIS — M8580 Other specified disorders of bone density and structure, unspecified site: Secondary | ICD-10-CM | POA: Diagnosis not present

## 2019-06-28 DIAGNOSIS — R778 Other specified abnormalities of plasma proteins: Secondary | ICD-10-CM

## 2019-06-28 DIAGNOSIS — Z79899 Other long term (current) drug therapy: Secondary | ICD-10-CM

## 2019-06-28 DIAGNOSIS — M79641 Pain in right hand: Secondary | ICD-10-CM | POA: Diagnosis not present

## 2019-06-28 DIAGNOSIS — E785 Hyperlipidemia, unspecified: Secondary | ICD-10-CM

## 2019-06-28 DIAGNOSIS — E876 Hypokalemia: Secondary | ICD-10-CM | POA: Insufficient documentation

## 2019-06-28 DIAGNOSIS — R7989 Other specified abnormal findings of blood chemistry: Secondary | ICD-10-CM

## 2019-06-28 DIAGNOSIS — I10 Essential (primary) hypertension: Secondary | ICD-10-CM | POA: Insufficient documentation

## 2019-06-28 DIAGNOSIS — K219 Gastro-esophageal reflux disease without esophagitis: Secondary | ICD-10-CM

## 2019-06-28 DIAGNOSIS — Z8249 Family history of ischemic heart disease and other diseases of the circulatory system: Secondary | ICD-10-CM

## 2019-06-28 DIAGNOSIS — Z7982 Long term (current) use of aspirin: Secondary | ICD-10-CM | POA: Diagnosis not present

## 2019-06-28 DIAGNOSIS — E039 Hypothyroidism, unspecified: Secondary | ICD-10-CM | POA: Diagnosis not present

## 2019-06-28 LAB — CMP (CANCER CENTER ONLY)
ALT: 28 U/L (ref 0–44)
AST: 30 U/L (ref 15–41)
Albumin: 4 g/dL (ref 3.5–5.0)
Alkaline Phosphatase: 173 U/L — ABNORMAL HIGH (ref 38–126)
Anion gap: 10 (ref 5–15)
BUN: 10 mg/dL (ref 8–23)
CO2: 29 mmol/L (ref 22–32)
Calcium: 9.8 mg/dL (ref 8.9–10.3)
Chloride: 96 mmol/L — ABNORMAL LOW (ref 98–111)
Creatinine: 1.03 mg/dL — ABNORMAL HIGH (ref 0.44–1.00)
GFR, Est AFR Am: 58 mL/min — ABNORMAL LOW (ref >60)
GFR, Estimated: 50 mL/min — ABNORMAL LOW (ref >60)
Glucose, Bld: 203 mg/dL — ABNORMAL HIGH (ref 70–99)
Potassium: 3.2 mmol/L — ABNORMAL LOW (ref 3.5–5.1)
Sodium: 135 mmol/L (ref 135–145)
Total Bilirubin: 1 mg/dL (ref 0.3–1.2)
Total Protein: 8.5 g/dL — ABNORMAL HIGH (ref 6.5–8.1)

## 2019-06-28 LAB — LACTATE DEHYDROGENASE: LDH: 182 U/L (ref 98–192)

## 2019-06-28 LAB — CBC WITH DIFFERENTIAL (CANCER CENTER ONLY)
Abs Immature Granulocytes: 0.01 10*3/uL (ref 0.00–0.07)
Basophils Absolute: 0 10*3/uL (ref 0.0–0.1)
Basophils Relative: 0 %
Eosinophils Absolute: 0.1 10*3/uL (ref 0.0–0.5)
Eosinophils Relative: 2 %
HCT: 39.4 % (ref 36.0–46.0)
Hemoglobin: 12.5 g/dL (ref 12.0–15.0)
Immature Granulocytes: 0 %
Lymphocytes Relative: 19 %
Lymphs Abs: 1.2 10*3/uL (ref 0.7–4.0)
MCH: 27.7 pg (ref 26.0–34.0)
MCHC: 31.7 g/dL (ref 30.0–36.0)
MCV: 87.2 fL (ref 80.0–100.0)
Monocytes Absolute: 0.3 10*3/uL (ref 0.1–1.0)
Monocytes Relative: 5 %
Neutro Abs: 4.6 10*3/uL (ref 1.7–7.7)
Neutrophils Relative %: 74 %
Platelet Count: 255 10*3/uL (ref 150–400)
RBC: 4.52 MIL/uL (ref 3.87–5.11)
RDW: 14.9 % (ref 11.5–15.5)
WBC Count: 6.2 10*3/uL (ref 4.0–10.5)
nRBC: 0 % (ref 0.0–0.2)

## 2019-06-28 LAB — SEDIMENTATION RATE: Sed Rate: 95 mm/hr — ABNORMAL HIGH (ref 0–22)

## 2019-06-28 LAB — C-REACTIVE PROTEIN: CRP: 1.5 mg/dL — ABNORMAL HIGH (ref <1.0)

## 2019-06-28 LAB — SAVE SMEAR(SSMR), FOR PROVIDER SLIDE REVIEW

## 2019-06-28 MED ORDER — POTASSIUM CHLORIDE CRYS ER 20 MEQ PO TBCR: 20.0000 meq | EXTENDED_RELEASE_TABLET | Freq: Every day | ORAL | 3 refills | Status: DC

## 2019-06-28 MED FILL — POTASSIUM CHLORIDE CRYS ER: 20 | 30 days supply | Qty: 30 | Fill #0

## 2019-06-29 ENCOUNTER — Other Ambulatory Visit: Payer: Self-pay | Admitting: Hematology

## 2019-06-29 ENCOUNTER — Telehealth: Payer: Self-pay | Admitting: Hematology

## 2019-06-29 DIAGNOSIS — R778 Other specified abnormalities of plasma proteins: Secondary | ICD-10-CM

## 2019-06-29 LAB — MULTIPLE MYELOMA PANEL, SERUM
Albumin SerPl Elph-Mcnc: 3.8 g/dL (ref 2.9–4.4)
Albumin/Glob SerPl: 0.9 (ref 0.7–1.7)
Alpha 1: 0.4 g/dL (ref 0.0–0.4)
Alpha2 Glob SerPl Elph-Mcnc: 1 g/dL (ref 0.4–1.0)
B-Globulin SerPl Elph-Mcnc: 1.1 g/dL (ref 0.7–1.3)
Gamma Glob SerPl Elph-Mcnc: 2.2 g/dL — ABNORMAL HIGH (ref 0.4–1.8)
Globulin, Total: 4.6 g/dL — ABNORMAL HIGH (ref 2.2–3.9)
IgA: 307 mg/dL (ref 64–422)
IgG (Immunoglobin G), Serum: 2310 mg/dL — ABNORMAL HIGH (ref 586–1602)
IgM (Immunoglobulin M), Srm: 211 mg/dL (ref 26–217)
Total Protein ELP: 8.4 g/dL (ref 6.0–8.5)

## 2019-06-29 LAB — KAPPA/LAMBDA LIGHT CHAINS
Kappa free light chain: 40.3 mg/L — ABNORMAL HIGH (ref 3.3–19.4)
Kappa, lambda light chain ratio: 0.97 (ref 0.26–1.65)
Lambda free light chains: 41.7 mg/L — ABNORMAL HIGH (ref 5.7–26.3)

## 2019-06-29 NOTE — Telephone Encounter (Signed)
Appointments scheduled patient notified and is ok w/ date/time per 9/1 los

## 2019-07-01 ENCOUNTER — Telehealth: Payer: Self-pay | Admitting: *Deleted

## 2019-07-01 NOTE — Telephone Encounter (Signed)
-----   Message from Tish Men, MD sent at 06/29/2019  3:40 PM EDT ----- Delrae Sawyers,  Can you also let Ms. Mouser know that her myeloma panel did not show any abnormal protein, and that her elevated protein is most likely elevated due to severe inflammation from her diffuse joint disease? I will see her back in a few weeks as scheduled.  Thanks.  [facility]  ----- Message ----- From: Buel Ream, Lab In [place] Sent: 06/28/2019  12:07 PM EDT To: Tish Men, MD

## 2019-07-01 NOTE — Telephone Encounter (Signed)
As noted below by Dr. Maylon Peppers, I informed the patient of her Myeloma protein level. The elevated protein level was most likely due to severe inflammation from your diffuse joint disease. She verbalized understanding.

## 2019-07-05 ENCOUNTER — Other Ambulatory Visit: Payer: Self-pay

## 2019-07-05 ENCOUNTER — Encounter: Payer: Self-pay | Admitting: Gastroenterology

## 2019-07-05 ENCOUNTER — Ambulatory Visit (INDEPENDENT_AMBULATORY_CARE_PROVIDER_SITE_OTHER): Payer: PPO | Admitting: Gastroenterology

## 2019-07-05 VITALS — BP 102/70 | HR 96 | Temp 98.4°F | Ht 63.0 in | Wt 160.5 lb

## 2019-07-05 DIAGNOSIS — R7989 Other specified abnormal findings of blood chemistry: Secondary | ICD-10-CM | POA: Insufficient documentation

## 2019-07-05 DIAGNOSIS — R945 Abnormal results of liver function studies: Secondary | ICD-10-CM | POA: Diagnosis not present

## 2019-07-05 DIAGNOSIS — K219 Gastro-esophageal reflux disease without esophagitis: Secondary | ICD-10-CM | POA: Diagnosis not present

## 2019-07-05 DIAGNOSIS — R932 Abnormal findings on diagnostic imaging of liver and biliary tract: Secondary | ICD-10-CM | POA: Diagnosis not present

## 2019-07-05 NOTE — Progress Notes (Signed)
[facility] VISIT   Primary Care Provider Colon Branch, [address] [place] [address] [phone]  Referring Provider Colon Branch, MD [address] [place],  [place] [address] [phone]  Patient Profile: Angela Clay is a 85 y.o. female with a pmh significant for hypertension, hyperlipidemia, hypothyroidism, osteoarthritis, prior thyroid cancer, status post cholecystectomy for symptomatic gallstones, GERD.  The patient presents to the [facility] for an evaluation and management of problem(s) noted below:  Problem List 1. Abnormal LFTs   2. Gastroesophageal reflux disease, esophagitis presence not specified   3. Abnormal ultrasound of liver     History of Present Illness This is the patient's first visit to the outpatient [place] clinic in years.  The patient was referred as she had persistent [facility] [place] elevated abnormal liver tests going back to July of this year most recent set of labs at the beginning of this month have shown continued improvement of persistent elevation in alkaline phosphatase.  She has had basic lab work there is ruled out viral hepatitis as well as a negative AMA.  Ultrasound of the abdomen suggested a fatty appearance to the liver without any splenomegaly and no venous thrombosis within the liver.  The patient is being worked up for an abnormal SPEP.  The patient states she has never had liver test abnormalities in the past that she is aware of and I cannot find any in the epic/care everywhere system.  The patient denies any issues with jaundice, scleral icterus, pruritus, darkened/amber urine, clay-colored stools, LE edema, hematemesis, coffee-ground emesis, abdominal distention, confusion, new generalized pruritus.  There have not been any new medications that have been initiated over the course the last few months.  Weight has been relatively stable without rapid increases in weight  gain or loss.  She does have some GERD symptoms and will take omeprazole on as needed basis but not daily.  She has not used Zantac or Pepcid.  Patient denies any dysphagia or odynophagia.  Her GERD symptoms occur more often if she eats something that dietarily causes her to have more reflux.  GI Review of Systems Positive as above Negative for nausea, vomiting, bloating, pain, change in bowel habits, melena, hematochezia  Review of Systems General: Denies fevers/chills/weight loss HEENT: Denies oral lesions Cardiovascular: Denies chest pain/palpitations Pulmonary: Denies shortness of breath/nocturnal cough Gastroenterological: See HPI Genitourinary: Denies darkened urine Hematological: Denies easy bruising/bleeding Endocrine: Denies temperature intolerance Dermatological: Denies jaundice Psychological: Mood is stable   Medications Current Outpatient Medications  Medication Sig Dispense Refill   acetaminophen (TYLENOL) 500 MG tablet Take 500 mg by mouth every 6 (six) hours as needed for mild pain.      aspirin 81 MG tablet Take 81 mg by mouth daily.       atorvastatin (LIPITOR) 40 MG tablet Take 1 tablet (40 mg total) by mouth at bedtime. 90 tablet 3   Colchicine (MITIGARE) 0.6 MG CAPS Take 1 capsule by mouth 2 (two) times daily as needed. 60 capsule 0   hydrochlorothiazide (HYDRODIURIL) 25 MG tablet Take 1 tablet (25 mg total) by mouth daily. 90 tablet 1   levothyroxine (SYNTHROID) 112 MCG tablet Take 1 tablet (112 mcg total) by mouth daily before breakfast. 90 tablet 1   omeprazole (PRILOSEC) 20 MG capsule Take 1 capsule (20 mg total) by mouth daily as needed (acid reflux). 90 capsule 3   potassium chloride SA (K-DUR) 20 MEQ tablet  Take 1 tablet (20 mEq total) by mouth daily. 30 tablet 3   Vitamin D, Ergocalciferol, (DRISDOL) 1.25 MG (50000 UT) CAPS capsule Take 1 capsule (50,000 Units total) by mouth every 7 (seven) days. 12 capsule 0   No current facility-administered  medications for this visit.     Allergies Allergies  Allergen Reactions   Codeine Nausea And Vomiting    Histories Past Medical History:  Diagnosis Date   Actinic keratosis 05/2015   Right mid central dorsal hand   Gallstones    GERD (gastroesophageal reflux disease)    EGD for dysphagia 2000 aprox (-)   History of radiation therapy    Hyperlipidemia    Hypertension    Hypothyroidism    Lymphoma ([place])    neck,mlig,unspecified site dx 1970s   OA (osteoarthritis)    severe at neck   Osteopenia    Syncope 11-2012   admited . w/u (-)   Thyroid cancer [facility])    Past Surgical History:  Procedure Laterality Date   APPENDECTOMY     BELPHAROPTOSIS REPAIR Bilateral 2017   CATARACT EXTRACTION, BILATERAL Bilateral 01/10/2019   Dr.Rankin   CHOLECYSTECTOMY  02/24/06   LEFT HEART CATH AND CORONARY ANGIOGRAPHY N/A 07/20/2018   Procedure: LEFT HEART CATH AND CORONARY ANGIOGRAPHY;  Surgeon: Belva Crome, MD;  Location: [facility];  Service: Cardiovascular;  Laterality: N/A;   THYROIDECTOMY     TONSILLECTOMY AND ADENOIDECTOMY  1949   TUBAL LIGATION  1972   vitamacular hole surgery Right 2020   eye   Social History   Socioeconomic History   Marital status: Married    Spouse name: Iona Beard   Number of children: 3   Years of education: Not on file   Highest education level: Not on file  Occupational History   Occupation: retired  Scientist, product/process development strain: Not on file   Food insecurity    Worry: Not on file    Inability: Not on Lexicographer needs    Medical: Not on file    Non-medical: Not on file  Tobacco Use   Smoking status: Never Smoker   Smokeless tobacco: Never Used  Substance and Sexual Activity   Alcohol use: No    Alcohol/week: 0.0 standard drinks   Drug use: No   Sexual activity: Not Currently    Partners: Male    Birth control/protection: Post-menopausal  Lifestyle   Physical activity    Days  per week: Not on file    Minutes per session: Not on file   Stress: Not on file  Relationships   Social connections    Talks on phone: Not on file    Gets together: Not on file    Attends religious service: Not on file    Active member of club or organization: Not on file    Attends meetings of clubs or organizations: Not on file    Relationship status: Not on file   Intimate partner violence    Fear of current or ex partner: Not on file    Emotionally abused: Not on file    Physically abused: Not on file    Forced sexual activity: Not on file  Other Topics Concern   Not on file  Social History Narrative   3 kids, 49 GK   Lives w/ husband   Lost a g-child to suicide 2017      Family History  Problem Relation Age of Onset   Stroke Mother  42   Coronary artery disease Father        MI age 84   Deep vein thrombosis Brother        PE at age 33   Uterine cancer Sister    Heart failure Sister    Diabetes Neg Hx    Colon cancer Neg Hx    Breast cancer Neg Hx    Inflammatory bowel disease Neg Hx    Liver disease Neg Hx    Pancreatic cancer Neg Hx    Rectal cancer Neg Hx    Stomach cancer Neg Hx    I have reviewed her medical, social, and family history in detail and updated the electronic medical record as necessary.    PHYSICAL EXAMINATION  BP 102/70 (BP Location: Left Arm, Patient Position: Sitting, Cuff Size: Normal)    Pulse 96    Temp 98.4 F (36.9 C)    Ht 5' 3"  (1.6 m) Comment: height measured without shoes   Wt 160 lb 8 oz (72.8 kg)    BMI 28.43 kg/m  Wt Readings from Last 3 Encounters:  07/05/19 160 lb 8 oz (72.8 kg)  06/28/19 160 lb 6.4 oz (72.8 kg)  06/01/19 162 lb 6 oz (73.7 kg)  GEN: NAD, appears stated age, doesn't appear chronically ill PSYCH: Cooperative, without pressured speech EYE: Conjunctivae pink, sclerae anicteric ENT: MMM, without oral ulcers, no erythema or exudates noted NECK: Supple CV: RR without R/Gs  RESP: CTAB  posteriorly, without wheezing GI: NABS, soft, NT/ND, without rebound or guarding, no HSM appreciated MSK/EXT: No lower extremity edema SKIN: No jaundice, no spider angiomata NEURO:  Alert & Oriented x 3, no focal deficits, no evidence of asterixis   REVIEW OF DATA  I reviewed the following data at the time of this encounter:  GI Procedures and Studies  2014 colonoscopy Diverticulosis as well as polyps in the descending colon Pathology consistent with hyperplastic polyps  Laboratory Studies  Reviewed those in epic  Jan 27, 2018 10:44 05/10/2019 10:12 06/01/2019 12:07 06/28/2019 11:50  AST 22 44 (H) 47 (H) 30  ALT 27 45 (H) 40 (H) 28  Alkaline Phosphatase 81 215 (H) 256 (H) 173 (H)  Total Bilirubin 0.7 1.0 0.8 1.0   Imaging Studies  July 2020 ultrasound abdomen IMPRESSION: Status post cholecystectomy. Increased echogenicity of hepatic parenchyma is noted most consistent with hepatic steatosis. Small right renal cyst. No other abnormality seen in the abdomen.   ASSESSMENT  Ms. Delehanty is a 85 y.o. female with a pmh significant for hypertension, hyperlipidemia, hypothyroidism, osteoarthritis, prior thyroid cancer, status post cholecystectomy for symptomatic gallstones, GERD.  The patient is seen today for evaluation and management of:  1. Abnormal LFTs   2. Gastroesophageal reflux disease, esophagitis presence not specified   3. Abnormal ultrasound of liver    The patient is hemodynamically and clinically stable.  Liver test abnormalities continue to improve however there is a persistence in the elevation of alkaline phosphatase and her liver enzymes have not down trended to her normal baseline.  Up to this point in time an underlying diagnosis has not been found.  She does have a echogenic appearance to her liver which suggest the possibility of fatty liver.  She does not drink significant alcohol for years.  The patient will have repeat laboratories in approximately 4 to 6 weeks and we  will add on a few other labs to come complete/round of the normal etiology/work-up for abnormal liver test.  If in particular her alkaline phosphatase  remains elevated we will consider an MRI/MRCP to further evaluate the biliary system with her AMA being negative.  If after 6 to 12 months the patient continues to have liver test abnormalities that are not clearly defined, she would benefit from a liver biopsy but we are far from that at this point in time.  She also is concerned about the potential risks of liver biopsy.  Follow-up to be dictated based on laboratories.  We also discussed today the role of PPI therapy in regards to GERD and the as needed use versus the as needed use of an H2 RA blocker such as Pepcid.  She wants to continue to use omeprazole on an as-needed basis.  Pulling on role of diagnostic endoscopy unless she develops progressive GERD symptoms that occur on a daily basis for breakthrough or she develops any dysphagia or other upper GI symptoms.  All patient questions were answered, to the best of my ability, and the patient agrees to the aforementioned plan of action with follow-up as indicated.   PLAN  Laboratories as outlined below to be obtained in approximately 4 to 6 weeks If alkaline phosphatase or liver tests remain elevated we will likely proceed with MRI CP Offered patient to transition to H2 RA from PPI but wants to keep PPI as needed No upper endoscopic evaluation needed currently She would not be due for colon cancer screening until 2024 unless her health is excellent she no longer requires colonoscopy for screening purposes   Orders Placed This Encounter  Procedures   Hepatic function panel   IBC + Ferritin   INR/PT   Alpha-1-antitrypsin   Anti-smooth muscle antibody, IgG   Ceruloplasmin   Hepatitis B Core Antibody, total   CK (Creatine Kinase)   Gamma GT    New Prescriptions   No medications on file   Modified Medications   No medications on  file    Planned Follow Up No follow-ups on file.   Justice Britain, MD [facility] Advanced [facility]oscopy Office # [phone]

## 2019-07-05 NOTE — Patient Instructions (Signed)
If you are age 83 or older, your body mass index should be between 23-30. Your Body mass index is 28.43 kg/m. If this is out of the aforementioned range listed, please consider follow up with your Primary Care Provider.  If you are age 61 or younger, your body mass index should be between 19-25. Your Body mass index is 28.43 kg/m. If this is out of the aformentioned range listed, please consider follow up with your Primary Care Provider.    Your provider has requested that you go to the basement level for lab work in 4 weeks( 07/28/2019). Press "B" on the elevator. The lab is located at the first door on the left as you exit the elevator.  Thank you for choosing me and [facility].  Dr. Rush Landmark

## 2019-07-07 DIAGNOSIS — R932 Abnormal findings on diagnostic imaging of liver and biliary tract: Secondary | ICD-10-CM | POA: Insufficient documentation

## 2019-07-19 ENCOUNTER — Other Ambulatory Visit: Payer: PPO

## 2019-07-19 ENCOUNTER — Ambulatory Visit: Payer: PPO | Admitting: Hematology

## 2019-07-19 DIAGNOSIS — D485 Neoplasm of uncertain behavior of skin: Secondary | ICD-10-CM | POA: Diagnosis not present

## 2019-07-19 DIAGNOSIS — L720 Epidermal cyst: Secondary | ICD-10-CM | POA: Diagnosis not present

## 2019-07-19 DIAGNOSIS — L905 Scar conditions and fibrosis of skin: Secondary | ICD-10-CM | POA: Diagnosis not present

## 2019-07-19 DIAGNOSIS — D225 Melanocytic nevi of trunk: Secondary | ICD-10-CM | POA: Diagnosis not present

## 2019-07-19 DIAGNOSIS — L80 Vitiligo: Secondary | ICD-10-CM | POA: Diagnosis not present

## 2019-07-19 DIAGNOSIS — D1801 Hemangioma of skin and subcutaneous tissue: Secondary | ICD-10-CM | POA: Diagnosis not present

## 2019-07-19 DIAGNOSIS — L570 Actinic keratosis: Secondary | ICD-10-CM | POA: Diagnosis not present

## 2019-07-19 DIAGNOSIS — Z85828 Personal history of other malignant neoplasm of skin: Secondary | ICD-10-CM | POA: Diagnosis not present

## 2019-07-19 DIAGNOSIS — L821 Other seborrheic keratosis: Secondary | ICD-10-CM | POA: Diagnosis not present

## 2019-07-21 ENCOUNTER — Inpatient Hospital Stay: Payer: PPO

## 2019-07-21 ENCOUNTER — Encounter: Payer: Self-pay | Admitting: Hematology

## 2019-07-21 ENCOUNTER — Inpatient Hospital Stay (HOSPITAL_BASED_OUTPATIENT_CLINIC_OR_DEPARTMENT_OTHER): Payer: PPO | Admitting: Hematology

## 2019-07-21 ENCOUNTER — Other Ambulatory Visit: Payer: Self-pay

## 2019-07-21 VITALS — BP 139/72 | HR 85 | Temp 97.1°F | Resp 18 | Ht 63.0 in | Wt 160.1 lb

## 2019-07-21 DIAGNOSIS — E876 Hypokalemia: Secondary | ICD-10-CM

## 2019-07-21 DIAGNOSIS — R7989 Other specified abnormal findings of blood chemistry: Secondary | ICD-10-CM

## 2019-07-21 DIAGNOSIS — R945 Abnormal results of liver function studies: Secondary | ICD-10-CM

## 2019-07-21 DIAGNOSIS — R778 Other specified abnormalities of plasma proteins: Secondary | ICD-10-CM

## 2019-07-21 LAB — CMP (CANCER CENTER ONLY)
ALT: 26 U/L (ref 0–44)
AST: 27 U/L (ref 15–41)
Albumin: 4 g/dL (ref 3.5–5.0)
Alkaline Phosphatase: 177 U/L — ABNORMAL HIGH (ref 38–126)
Anion gap: 8 (ref 5–15)
BUN: 12 mg/dL (ref 8–23)
CO2: 29 mmol/L (ref 22–32)
Calcium: 10 mg/dL (ref 8.9–10.3)
Chloride: 98 mmol/L (ref 98–111)
Creatinine: 0.92 mg/dL (ref 0.44–1.00)
GFR, Est AFR Am: >60 mL/min (ref >60)
GFR, Estimated: 58 mL/min — ABNORMAL LOW (ref >60)
Glucose, Bld: 224 mg/dL — ABNORMAL HIGH (ref 70–99)
Potassium: 4.1 mmol/L (ref 3.5–5.1)
Sodium: 135 mmol/L (ref 135–145)
Total Bilirubin: 0.8 mg/dL (ref 0.3–1.2)
Total Protein: 8.6 g/dL — ABNORMAL HIGH (ref 6.5–8.1)

## 2019-07-21 LAB — CBC WITH DIFFERENTIAL (CANCER CENTER ONLY)
Abs Immature Granulocytes: 0.01 10*3/uL (ref 0.00–0.07)
Basophils Absolute: 0 10*3/uL (ref 0.0–0.1)
Basophils Relative: 0 %
Eosinophils Absolute: 0.2 10*3/uL (ref 0.0–0.5)
Eosinophils Relative: 3 %
HCT: 38.7 % (ref 36.0–46.0)
Hemoglobin: 12.4 g/dL (ref 12.0–15.0)
Immature Granulocytes: 0 %
Lymphocytes Relative: 20 %
Lymphs Abs: 1.3 10*3/uL (ref 0.7–4.0)
MCH: 28.1 pg (ref 26.0–34.0)
MCHC: 32 g/dL (ref 30.0–36.0)
MCV: 87.8 fL (ref 80.0–100.0)
Monocytes Absolute: 0.4 10*3/uL (ref 0.1–1.0)
Monocytes Relative: 6 %
Neutro Abs: 4.5 10*3/uL (ref 1.7–7.7)
Neutrophils Relative %: 71 %
Platelet Count: 222 10*3/uL (ref 150–400)
RBC: 4.41 MIL/uL (ref 3.87–5.11)
RDW: 15.7 % — ABNORMAL HIGH (ref 11.5–15.5)
WBC Count: 6.4 10*3/uL (ref 4.0–10.5)
nRBC: 0 % (ref 0.0–0.2)

## 2019-07-21 NOTE — Progress Notes (Signed)
[facility] OFFICE PROGRESS NOTE  Patient Care Team: Colon Branch, MD as PCP - Cyndia Diver, MD as PCP - Cardiology (Cardiology) Jerrell Belfast, MD as Consulting Physician (Otolaryngology) [name], Amy, MD as Consulting Physician (Dermatology)  HEME/ONC OVERVIEW: 1. Polyclonal gammopathy -Secondary to inflammation from gout flare  -05/2019: SPEP showed "one or more serum protein fractions outside normal range but no abnormal protein bands identified"   Repeat SPEP showed no M-spike; IFE showed no evidence of monoclonal protein   ASSESSMENT & PLAN:   Polyclonal gammopathy -I reviewed the myeloma panel in detail with the patient -In summary, SPEP showed no evidence of M spike, and immunofixation showed polyclonal protein without evidence of any monoclonal protein -In light of the patient's recent gout flare, this is most consistent with polyclonal gammopathy in the setting of systemic inflammation  -In the absence of any monoclonal protein and given the normal CBC, there is no evidence of plasma cell dyscrasia, and I do not recommend any further work-up  Elevated LFT's -Etiology unclear -Patient recently saw Dr. Rush Landmark of hepatology, who is pursuing further work-up -Continue follow-up with hepatology for further evaluation  No orders of the defined types were placed in this encounter.  All questions were answered. The patient knows to call the clinic with any problems, questions or concerns. No barriers to learning was detected.  A total of more than 15 minutes were spent face-to-face with the patient during this encounter and over half of that time was spent on counseling and coordination of care as outlined above.   No return needed.   Tish Men, MD 07/21/2019 10:42 AM  CHIEF COMPLAINT: "I am doing fine"  INTERVAL HISTORY: Ms. Elizondo returns to clinic for follow-up of recent myeloma lab panel.  Patient reports that she was cooking fish in the  oven discrete when she accidentally burned her right upper arm.  She was seen by her dermatologist, who prescribed topical antibiotics.  Patient reports that the burn injury is improving, and denies any signs of infection.  She is otherwise feeling well, and denies any complaints.  REVIEW OF SYSTEMS:   Constitutional: ( - ) fevers, ( - )  chills , ( - ) night sweats Eyes: ( - ) blurriness of vision, ( - ) double vision, ( - ) watery eyes Ears, nose, mouth, throat, and face: ( - ) mucositis, ( - ) sore throat Respiratory: ( - ) cough, ( - ) dyspnea, ( - ) wheezes Cardiovascular: ( - ) palpitation, ( - ) chest discomfort, ( - ) lower extremity swelling Gastrointestinal:  ( - ) nausea, ( - ) heartburn, ( - ) change in bowel habits Skin: ( + ) abnormal skin rashes Lymphatics: ( - ) new lymphadenopathy, ( - ) easy bruising Neurological: ( - ) numbness, ( - ) tingling, ( - ) new weaknesses Behavioral/Psych: ( - ) mood change, ( - ) new changes  All other systems were reviewed with the patient and are negative.  SUMMARY OF ONCOLOGIC HISTORY: Oncology History   No history exists.    I have reviewed the past medical history, past surgical history, social history and family history with the patient and they are unchanged from previous note.  ALLERGIES:  is allergic to codeine.  MEDICATIONS:  Current Outpatient Medications  Medication Sig Dispense Refill  . acetaminophen (TYLENOL) 500 MG tablet Take 500 mg by mouth every 6 (six) hours as needed for mild pain.     Marland Kitchen  aspirin 81 MG tablet Take 81 mg by mouth daily.      Marland Kitchen atorvastatin (LIPITOR) 40 MG tablet Take 1 tablet (40 mg total) by mouth at bedtime. 90 tablet 3  . Colchicine (MITIGARE) 0.6 MG CAPS Take 1 capsule by mouth 2 (two) times daily as needed. 60 capsule 0  . hydrochlorothiazide (HYDRODIURIL) 25 MG tablet Take 1 tablet (25 mg total) by mouth daily. 90 tablet 1  . levothyroxine (SYNTHROID) 112 MCG tablet Take 1 tablet (112 mcg total)  by mouth daily before breakfast. 90 tablet 1  . mupirocin ointment (BACTROBAN) 2 % APPLY OINTMENT TOPICALLY TO AFFECTED AREA TWICE DAILY FOR 14 DAYS    . omeprazole (PRILOSEC) 20 MG capsule Take 1 capsule (20 mg total) by mouth daily as needed (acid reflux). 90 capsule 3  . potassium chloride SA (K-DUR) 20 MEQ tablet Take 1 tablet (20 mEq total) by mouth daily. 30 tablet 3  . Vitamin D, Ergocalciferol, (DRISDOL) 1.25 MG (50000 UT) CAPS capsule Take 1 capsule (50,000 Units total) by mouth every 7 (seven) days. 12 capsule 0   No current facility-administered medications for this visit.     PHYSICAL EXAMINATION: ECOG PERFORMANCE STATUS: 1 - Symptomatic but completely ambulatory  Today's Vitals   07/21/19 1036  BP: 139/72  Pulse: 85  Resp: 18  Temp: (!) 97.1 F (36.2 C)  TempSrc: Temporal  SpO2: 100%  Weight: 160 lb 1.9 oz (72.6 kg)  Height: 5' 3"  (1.6 m)  PainSc: 0-No pain   Body mass index is 28.36 kg/m.  Filed Weights   07/21/19 1036  Weight: 160 lb 1.9 oz (72.6 kg)    GENERAL: alert, no distress and comfortable SKIN: a small dressing covering the right upper arm EYES: conjunctiva are pink and non-injected, sclera clear OROPHARYNX: no exudate, no erythema; lips, buccal mucosa, and tongue normal  NECK: supple, non-tender LUNGS: clear to auscultation with normal breathing effort HEART: regular rate & rhythm and no murmurs and no lower extremity edema ABDOMEN: soft, non-tender, non-distended, normal bowel sounds Musculoskeletal: no cyanosis of digits and no clubbing  PSYCH: alert & oriented x 3, fluent speech  LABORATORY DATA:  I have reviewed the data as listed    Component Value Date/Time   NA 135 06/28/2019 1150   K 3.2 (L) 06/28/2019 1150   CL 96 (L) 06/28/2019 1150   CO2 29 06/28/2019 1150   GLUCOSE 203 (H) 06/28/2019 1150   BUN 10 06/28/2019 1150   CREATININE 1.03 (H) 06/28/2019 1150   CALCIUM 9.8 06/28/2019 1150   PROT 8.5 (H) 06/28/2019 1150   ALBUMIN 4.0  06/28/2019 1150   AST 30 06/28/2019 1150   ALT 28 06/28/2019 1150   ALKPHOS 173 (H) 06/28/2019 1150   BILITOT 1.0 06/28/2019 1150   GFRNONAA 50 (L) 06/28/2019 1150   GFRAA 58 (L) 06/28/2019 1150    No results found for: SPEP, UPEP  Lab Results  Component Value Date   WBC 6.4 07/21/2019   NEUTROABS 4.5 07/21/2019   HGB 12.4 07/21/2019   HCT 38.7 07/21/2019   MCV 87.8 07/21/2019   PLT 222 07/21/2019      Chemistry      Component Value Date/Time   NA 135 06/28/2019 1150   K 3.2 (L) 06/28/2019 1150   CL 96 (L) 06/28/2019 1150   CO2 29 06/28/2019 1150   BUN 10 06/28/2019 1150   CREATININE 1.03 (H) 06/28/2019 1150      Component Value Date/Time   CALCIUM 9.8  06/28/2019 1150   ALKPHOS 173 (H) 06/28/2019 1150   AST 30 06/28/2019 1150   ALT 28 06/28/2019 1150   BILITOT 1.0 06/28/2019 1150       RADIOGRAPHIC STUDIES: I have personally reviewed the radiological images as listed below and agreed with the findings in the report. No results found.

## 2019-08-01 ENCOUNTER — Other Ambulatory Visit (INDEPENDENT_AMBULATORY_CARE_PROVIDER_SITE_OTHER): Payer: PPO

## 2019-08-01 DIAGNOSIS — R7989 Other specified abnormal findings of blood chemistry: Secondary | ICD-10-CM

## 2019-08-01 DIAGNOSIS — R945 Abnormal results of liver function studies: Secondary | ICD-10-CM

## 2019-08-01 LAB — CK: Total CK: 42 U/L (ref 7–177)

## 2019-08-01 LAB — HEPATIC FUNCTION PANEL
ALT: 31 U/L (ref 0–35)
AST: 33 U/L (ref 0–37)
Albumin: 4.2 g/dL (ref 3.5–5.2)
Alkaline Phosphatase: 206 U/L — ABNORMAL HIGH (ref 39–117)
Bilirubin, Direct: 0.2 mg/dL (ref 0.0–0.3)
Total Bilirubin: 0.8 mg/dL (ref 0.2–1.2)
Total Protein: 8.6 g/dL — ABNORMAL HIGH (ref 6.0–8.3)

## 2019-08-01 LAB — IBC + FERRITIN
Ferritin: 344.7 ng/mL — ABNORMAL HIGH (ref 10.0–291.0)
Iron: 74 ug/dL (ref 42–145)
Saturation Ratios: 18.8 % — ABNORMAL LOW (ref 20.0–50.0)
Transferrin: 281 mg/dL (ref 212.0–360.0)

## 2019-08-01 LAB — PROTIME-INR
INR: 1.1 ratio — ABNORMAL HIGH (ref 0.8–1.0)
Prothrombin Time: 12.6 s (ref 9.6–13.1)

## 2019-08-01 LAB — GAMMA GT: GGT: 350 U/L — ABNORMAL HIGH (ref 7–51)

## 2019-08-03 LAB — HEPATITIS B CORE ANTIBODY, TOTAL: Hep B Core Total Ab: NONREACTIVE

## 2019-08-03 LAB — CERULOPLASMIN: Ceruloplasmin: 37 mg/dL (ref 18–53)

## 2019-08-03 LAB — ANTI-SMOOTH MUSCLE ANTIBODY, IGG: Actin (Smooth Muscle) Antibody (IGG): 89 U — ABNORMAL HIGH (ref <20)

## 2019-08-03 LAB — ALPHA-1-ANTITRYPSIN: A-1 Antitrypsin, Ser: 196 mg/dL (ref 83–199)

## 2019-08-04 ENCOUNTER — Encounter: Payer: Self-pay | Admitting: Internal Medicine

## 2019-08-08 ENCOUNTER — Other Ambulatory Visit: Payer: Self-pay

## 2019-08-08 DIAGNOSIS — R945 Abnormal results of liver function studies: Secondary | ICD-10-CM

## 2019-08-08 DIAGNOSIS — R899 Unspecified abnormal finding in specimens from other organs, systems and tissues: Secondary | ICD-10-CM | POA: Insufficient documentation

## 2019-08-08 DIAGNOSIS — R932 Abnormal findings on diagnostic imaging of liver and biliary tract: Secondary | ICD-10-CM

## 2019-08-08 DIAGNOSIS — R7989 Other specified abnormal findings of blood chemistry: Secondary | ICD-10-CM

## 2019-08-11 NOTE — Telephone Encounter (Signed)
I called and spoke with the patient about the results of the anti-smooth muscle antibody on 10/12. We went over her results and the reasoning as to why she does not fit the typical picture of autoimmune hepatitis. We have to keep this in mind however. She is going to complete the additional lab work that has been ordered as well as the MRI/MRCP. Okay for her to postpone the MRI/MRCP to the new date to accommodate her travels. Patient work-up including laboratories and/or liver biopsy will be considered down the road. Patient appreciative for the call back.  Justice Britain, MD [facility] Office # [phone]

## 2019-08-15 ENCOUNTER — Ambulatory Visit (HOSPITAL_COMMUNITY): Payer: PPO

## 2019-08-26 ENCOUNTER — Ambulatory Visit (HOSPITAL_COMMUNITY): Payer: PPO

## 2019-08-29 ENCOUNTER — Ambulatory Visit (HOSPITAL_COMMUNITY)
Admission: RE | Admit: 2019-08-29 | Discharge: 2019-08-29 | Disposition: A | Discharge status: Home / Self Care | Payer: PPO | Source: Ambulatory Visit | Attending: Gastroenterology | Admitting: Gastroenterology

## 2019-08-29 ENCOUNTER — Encounter (HOSPITAL_COMMUNITY): Payer: Self-pay | Admitting: Radiology

## 2019-08-29 ENCOUNTER — Other Ambulatory Visit (INDEPENDENT_AMBULATORY_CARE_PROVIDER_SITE_OTHER): Payer: PPO

## 2019-08-29 ENCOUNTER — Other Ambulatory Visit: Payer: Self-pay

## 2019-08-29 DIAGNOSIS — R945 Abnormal results of liver function studies: Secondary | ICD-10-CM

## 2019-08-29 DIAGNOSIS — K862 Cyst of pancreas: Secondary | ICD-10-CM | POA: Diagnosis not present

## 2019-08-29 DIAGNOSIS — R932 Abnormal findings on diagnostic imaging of liver and biliary tract: Secondary | ICD-10-CM

## 2019-08-29 DIAGNOSIS — R7989 Other specified abnormal findings of blood chemistry: Secondary | ICD-10-CM

## 2019-08-29 DIAGNOSIS — K760 Fatty (change of) liver, not elsewhere classified: Secondary | ICD-10-CM | POA: Diagnosis not present

## 2019-08-29 DIAGNOSIS — R935 Abnormal findings on diagnostic imaging of other abdominal regions, including retroperitoneum: Secondary | ICD-10-CM | POA: Diagnosis not present

## 2019-08-29 LAB — HEPATIC FUNCTION PANEL
ALT: 29 U/L (ref 0–35)
AST: 29 U/L (ref 0–37)
Albumin: 4 g/dL (ref 3.5–5.2)
Alkaline Phosphatase: 171 U/L — ABNORMAL HIGH (ref 39–117)
Bilirubin, Direct: 0.2 mg/dL (ref 0.0–0.3)
Total Bilirubin: 0.8 mg/dL (ref 0.2–1.2)
Total Protein: 8.6 g/dL — ABNORMAL HIGH (ref 6.0–8.3)

## 2019-08-29 MED ORDER — GADOBUTROL 1 MMOL/ML IV SOLN
7.5000 mL | Freq: Once | INTRAVENOUS | Status: AC | PRN
  Administered 2019-08-29: 10:00:00 7.5 mL via INTRAVENOUS

## 2019-08-29 NOTE — Addendum Note (Signed)
Addended by: Raliegh Ip on: 08/29/2019 09:22 AM   Modules accepted: Orders

## 2019-08-30 ENCOUNTER — Other Ambulatory Visit: Payer: Self-pay | Admitting: Gastroenterology

## 2019-08-30 DIAGNOSIS — R932 Abnormal findings on diagnostic imaging of liver and biliary tract: Secondary | ICD-10-CM

## 2019-08-30 DIAGNOSIS — R7989 Other specified abnormal findings of blood chemistry: Secondary | ICD-10-CM

## 2019-08-30 DIAGNOSIS — R945 Abnormal results of liver function studies: Secondary | ICD-10-CM

## 2019-08-30 LAB — HEPATITIS A ANTIBODY, TOTAL: Hepatitis A AB,Total: NONREACTIVE

## 2019-08-30 LAB — HEPATITIS B SURFACE ANTIBODY,QUALITATIVE: Hep B S Ab: NONREACTIVE

## 2019-08-31 LAB — ALKALINE PHOSPHATASE, ISOENZYMES
Alkaline Phosphatase: 203 IU/L — ABNORMAL HIGH (ref 39–117)
BONE FRACTION: 21 % (ref 14–68)
INTESTINAL FRAC.: 0 % (ref 0–18)
LIVER FRACTION: 79 % (ref 18–85)

## 2019-09-08 DIAGNOSIS — H43822 Vitreomacular adhesion, left eye: Secondary | ICD-10-CM | POA: Diagnosis not present

## 2019-09-08 DIAGNOSIS — Z9889 Other specified postprocedural states: Secondary | ICD-10-CM | POA: Diagnosis not present

## 2019-09-08 DIAGNOSIS — H4322 Crystalline deposits in vitreous body, left eye: Secondary | ICD-10-CM | POA: Diagnosis not present

## 2019-09-08 DIAGNOSIS — H35341 Macular cyst, hole, or pseudohole, right eye: Secondary | ICD-10-CM | POA: Diagnosis not present

## 2019-09-12 ENCOUNTER — Other Ambulatory Visit: Payer: Self-pay | Admitting: Internal Medicine

## 2019-09-17 ENCOUNTER — Telehealth: Payer: PPO | Admitting: Nurse Practitioner

## 2019-09-17 DIAGNOSIS — Z20822 Contact with and (suspected) exposure to covid-19: Secondary | ICD-10-CM

## 2019-09-17 DIAGNOSIS — K591 Functional diarrhea: Secondary | ICD-10-CM

## 2019-09-17 DIAGNOSIS — R519 Headache, unspecified: Secondary | ICD-10-CM

## 2019-09-17 DIAGNOSIS — R5081 Fever presenting with conditions classified elsewhere: Secondary | ICD-10-CM

## 2019-09-17 DIAGNOSIS — Z20828 Contact with and (suspected) exposure to other viral communicable diseases: Secondary | ICD-10-CM

## 2019-09-17 NOTE — Progress Notes (Signed)
E-Visit for Corona Virus Screening   Your current symptoms could be consistent with the coronavirus.  Many health care providers can now test patients at their office but not all are.  [facility] has multiple testing sites. For information on our COVID testing locations and hours go to [email or web address]  Please quarantine yourself while awaiting your test results.  We are enrolling you in our [place] for [place] . Daily you will receive a questionnaire within the [facility] website. Our COVID 19 response team willl be monitoriing your responses daily. Please continue good preventive care measures, including:  frequent hand-washing, avoid touching your face, cover coughs/sneezes, stay out of crowds and keep a 6 foot distance from others.    You can go to one of the  testing sites listed below, while they are opened (see hours). You do not need a doctors order to be tested for covid.You do need to self-isolate until your results return and if positive 14 days from when your symptoms started and until you are 3 days symptom free.   Testing Locations (Monday - Friday, 10a.m. - 3:30 p.m.)   [place]: [facility] Entrance), [address], [place], [place]: [place], [place], [place], [place] (entrance [address] (Closed each Monday): [address] [place], [place] - the short stay covered drive at [facility] (Use the [employer] entrance to [facility] next to [place] is a respiratory illness with symptoms that are similar to the flu. Symptoms are typically mild to moderate, but there have been cases of severe illness and death due to the virus. The following symptoms may appear 2-14 days after exposure: . Fever . Cough . Shortness of breath or difficulty  breathing . Chills . Repeated shaking with chills . Muscle pain . Headache . Sore throat . New loss of taste or smell . Fatigue . Congestion or runny nose . Nausea or vomiting . Diarrhea  If you develop fever/cough/breathlessness, please stay home for 10 days with improving symptoms and until you have had 24 hours of no fever (without taking a fever reducer).  Go to the nearest hospital ED for assessment if fever/cough/breathlessness are severe or illness seems like a threat to life.  It is vitally important that if you feel that you have an infection such as this virus or any other virus that you stay home and away from places where you may spread it to others.  You should avoid contact with people age 39 and older.   You should wear a mask or cloth face covering over your nose and mouth if you must be around other people or animals, including pets (even at home). Try to stay at least 6 feet away from other people. This will protect the people around you.  You can use medication such as imodium AD OTC for diarrhea  You may also take acetaminophen (Tylenol) as needed for fever.   Reduce your risk of any infection by using the same precautions used for avoiding the common cold or flu:  Marland Kitchen Wash your hands often with soap and warm water for at least 20 seconds.  If soap and water are not readily available, use an alcohol-based hand sanitizer with at least 60% alcohol.  . If coughing or sneezing, cover your mouth and nose by coughing or sneezing into  the elbow areas of your shirt or coat, into a tissue or into your sleeve (not your hands). . Avoid shaking hands with others and consider head nods or verbal greetings only. . Avoid touching your eyes, nose, or mouth with unwashed hands.  . Avoid close contact with people who are sick. . Avoid places or events with large numbers of people in one location, like concerts or sporting events. . Carefully consider travel plans you have or are  making. . If you are planning any travel outside or inside the [place], visit the CDC's Travelers' Health webpage for the latest health notices. . If you have some symptoms but not all symptoms, continue to monitor at home and seek medical attention if your symptoms worsen. . If you are having a medical emergency, call 911.  HOME CARE . Only take medications as instructed by your medical team. . Drink plenty of fluids and get plenty of rest. . A steam or ultrasonic humidifier can help if you have congestion.   GET HELP RIGHT AWAY IF YOU HAVE EMERGENCY WARNING SIGNS** FOR COVID-19. If you or someone is showing any of these signs seek emergency medical care immediately. Call 911 or proceed to your closest emergency facility if: . You develop worsening high fever. . Trouble breathing . Bluish lips or face . Persistent pain or pressure in the chest . New confusion . Inability to wake or stay awake . You cough up blood. . Your symptoms become more severe  **This list is not all possible symptoms. Contact your medical provider for any symptoms that are sever or concerning to you.   MAKE SURE YOU   Understand these instructions.  Will watch your condition.  Will get help right away if you are not doing well or get worse.  Your e-visit answers were reviewed by a board certified advanced clinical practitioner to complete your personal care plan.  Depending on the condition, your plan could have included both over the counter or prescription medications.  If there is a problem please reply once you have received a response from your provider.  Your safety is important to [place].  If you have drug allergies check your prescription carefully.    You can use MyChart to ask questions about today's visit, request a non-urgent call back, or ask for a work or school excuse for 24 hours related to this e-Visit. If it has been greater than 24 hours you will need to follow up with your provider, or enter a new  e-Visit to address those concerns. You will get an e-mail in the next two days asking about your experience.  I hope that your e-visit has been valuable and will speed your recovery. Thank you for using e-visits.   5-10 minutes spent reviewing and documenting in chart.

## 2019-10-13 ENCOUNTER — Ambulatory Visit (INDEPENDENT_AMBULATORY_CARE_PROVIDER_SITE_OTHER): Payer: PPO | Admitting: Gastroenterology

## 2019-10-13 ENCOUNTER — Encounter: Payer: Self-pay | Admitting: Gastroenterology

## 2019-10-13 ENCOUNTER — Other Ambulatory Visit: Payer: Self-pay

## 2019-10-13 VITALS — BP 110/74 | HR 80 | Temp 97.4°F | Ht 63.0 in | Wt 153.0 lb

## 2019-10-13 DIAGNOSIS — R748 Abnormal levels of other serum enzymes: Secondary | ICD-10-CM | POA: Diagnosis not present

## 2019-10-13 DIAGNOSIS — K838 Other specified diseases of biliary tract: Secondary | ICD-10-CM | POA: Diagnosis not present

## 2019-10-13 DIAGNOSIS — R899 Unspecified abnormal finding in specimens from other organs, systems and tissues: Secondary | ICD-10-CM

## 2019-10-13 DIAGNOSIS — K862 Cyst of pancreas: Secondary | ICD-10-CM

## 2019-10-13 NOTE — Patient Instructions (Signed)
You have been scheduled for an endoscopy. Please follow written instructions given to you at your visit today. If you use inhalers (even only as needed), please bring them with you on the day of your procedure.  If you are age 83 or older, your body mass index should be between 23-30. Your Body mass index is 27.1 kg/m. If this is out of the aforementioned range listed, please consider follow up with your Primary Care Provider.  Thank you for choosing me and [facility].  Dr. Rush Landmark

## 2019-10-13 NOTE — Progress Notes (Signed)
[place] VISIT   Primary Care Provider [address], [address] [place] [id number] [phone]  Patient Profile: Angela Clay is a 85 y.o. female with a pmh significant for hypertension, hyperlipidemia, hypothyroidism, osteoarthritis, prior thyroid cancer, status post cholecystectomy for symptomatic gallstones, GERD, pancreatic cyst (likely BD-IPMN).  The patient presents to the [facility] for an evaluation and management of problem(s) noted below:  Problem List 1. Elevated alkaline phosphatase level   2. Dilated bile duct   3. Pancreatic cyst   4. Positive antismooth muscle antibody      History of Present Illness Please see initial consultation note for full details of HPI.  Interval History Today, the patient returns for scheduled follow-up.  The patient has been doing well overall.  Repeat laboratories over the course the last few months which showed a persistent elevation in her alkaline phosphatase.  She also was found to have a positive anti-smooth muscle antibody but negative ANA.  Transferases have been within the normal range.  We eventually sent alkaline phosphatase fractionation and this showed normal presence of liver and bone fractionation without significant elevation in either.  An MRI/MRCP was performed with results as below.  There was a 10 mm cystic-appearing lesion in the pancreas which suggested a branch duct IPMN.  Bile duct dilation was noted but in her postcholecystectomy state was felt to be normal.  No evidence of choledocholithiasis.  The patient remains well without any significant GI symptoms.  The patient denies any issues with jaundice, scleral icterus, pruritus, darkened urine, clay colored stools, lower extremity edema, hematemesis, coffee-ground emesis, abdominal distention, confusion, pruritus.  No new medications have been initiated.  Weight has been stable.  GERD symptoms controlled  on an as-needed basis but are not occurring daily.  She has no significant dysphagia.  GI Review of Systems Positive as above Negative for nausea, vomiting, abdominal pain, change in bowel habits, blood in stools   Review of Systems General: Denies fevers/chills/weight loss Cardiovascular: Denies chest pain/palpitations Pulmonary: Denies shortness of breath/nocturnal cough Gastroenterological: See HPI Genitourinary: Denies darkened urine Hematological: Denies easy bruising/bleeding Endocrine: Denies temperature intolerance Dermatological: Denies jaundice Psychological: Mood is stable though remains anxious to try and get an answer to her persistent elevation in alkaline phosphatase   Medications Current Outpatient Medications  Medication Sig Dispense Refill  . acetaminophen (TYLENOL) 500 MG tablet Take 500 mg by mouth every 6 (six) hours as needed for mild pain.     Marland Kitchen aspirin 81 MG tablet Take 81 mg by mouth daily.      Marland Kitchen atorvastatin (LIPITOR) 40 MG tablet Take 1 tablet (40 mg total) by mouth at bedtime. 90 tablet 3  . Colchicine (MITIGARE) 0.6 MG CAPS Take 1 capsule by mouth 2 (two) times daily as needed. 60 capsule 0  . hydrochlorothiazide (HYDRODIURIL) 25 MG tablet Take 1 tablet (25 mg total) by mouth daily. 90 tablet 1  . levothyroxine (SYNTHROID) 112 MCG tablet Take 1 tablet (112 mcg total) by mouth daily before breakfast. 90 tablet 1  . omeprazole (PRILOSEC) 20 MG capsule Take 1 capsule (20 mg total) by mouth daily as needed (acid reflux). 90 capsule 3  . potassium chloride SA (K-DUR) 20 MEQ tablet Take 1 tablet (20 mEq total) by mouth daily. 30 tablet 3   No current facility-administered medications for this visit.    Allergies Allergies  Allergen Reactions  . Codeine Nausea And Vomiting    Histories  Past Medical History:  Diagnosis Date  . Actinic keratosis 05/2015   Right mid central dorsal hand  . Gallstones   . GERD (gastroesophageal reflux disease)    EGD for  dysphagia 2000 aprox (-)  . History of radiation therapy   . Hyperlipidemia   . Hypertension   . Hypothyroidism   . Lymphoma ([place])    neck,mlig,unspecified site dx 1970s  . OA (osteoarthritis)    severe at neck  . Osteopenia   . Syncope 11-2012   admited . w/u (-)  . Thyroid cancer [facility])    Past Surgical History:  Procedure Laterality Date  . APPENDECTOMY    . BELPHAROPTOSIS REPAIR Bilateral 2017  . CATARACT EXTRACTION, BILATERAL Bilateral 01/10/2019   Dr.Rankin  . CHOLECYSTECTOMY  02/24/06  . LEFT HEART CATH AND CORONARY ANGIOGRAPHY N/A 07/20/2018   Procedure: LEFT HEART CATH AND CORONARY ANGIOGRAPHY;  Surgeon: Belva Crome, MD;  Location: [place] [facility];  Service: Cardiovascular;  Laterality: N/A;  . THYROIDECTOMY    . TONSILLECTOMY AND ADENOIDECTOMY  1949  . TUBAL LIGATION  1972  . vitamacular hole surgery Right 2020   eye   Social History   Socioeconomic History  . Marital status: Married    Spouse name: Iona Beard  . Number of children: 3  . Years of education: Not on file  . Highest education level: Not on file  Occupational History  . Occupation: retired  Tobacco Use  . Smoking status: Never Smoker  . Smokeless tobacco: Never Used  Substance and Sexual Activity  . Alcohol use: No    Alcohol/week: 0.0 standard drinks  . Drug use: No  . Sexual activity: Not Currently    Partners: Male    Birth control/protection: Post-menopausal  Other Topics Concern  . Not on file  Social History Narrative   3 kids, 8 GK   Lives w/ husband   Lost a g-child to suicide 2017      Social Determinants of Health   Financial Resource Strain:   . Difficulty of Paying Living Expenses: Not on file  Food Insecurity:   . Worried About Charity fundraiser in the Last Year: Not on file  . Ran Out of Food in the Last Year: Not on file  Transportation Needs:   . Lack of Transportation (Medical): Not on file  . Lack of Transportation (Non-Medical): Not on file  Physical Activity:    . Days of Exercise per Week: Not on file  . Minutes of Exercise per Session: Not on file  Stress:   . Feeling of Stress : Not on file  Social Connections:   . Frequency of Communication with Friends and Family: Not on file  . Frequency of Social Gatherings with Friends and Family: Not on file  . Attends Religious Services: Not on file  . Active Member of Clubs or Organizations: Not on file  . Attends Archivist Meetings: Not on file  . Marital Status: Not on file  Intimate Partner Violence:   . Fear of Current or Ex-Partner: Not on file  . Emotionally Abused: Not on file  . Physically Abused: Not on file  . Sexually Abused: Not on file   Family History  Problem Relation Age of Onset  . Stroke Mother 43  . Coronary artery disease Father        MI age 64  . Deep vein thrombosis Brother        PE at age 81  . Uterine  cancer Sister   . Heart failure Sister   . Diabetes Neg Hx   . Colon cancer Neg Hx   . Breast cancer Neg Hx   . Inflammatory bowel disease Neg Hx   . Liver disease Neg Hx   . Pancreatic cancer Neg Hx   . Rectal cancer Neg Hx   . Stomach cancer Neg Hx    I have reviewed her medical, social, and family history in detail and updated the electronic medical record as necessary.    PHYSICAL EXAMINATION  BP 110/74   Pulse 80   Temp (!) 97.4 F (36.3 C)   Ht 5' 3"  (1.6 m)   Wt 153 lb (69.4 kg)   BMI 27.10 kg/m  Wt Readings from Last 3 Encounters:  10/13/19 153 lb (69.4 kg)  07/21/19 160 lb 1.9 oz (72.6 kg)  07/05/19 160 lb 8 oz (72.8 kg)  GEN: NAD, appears stated age, doesn't appear chronically ill PSYCH: Cooperative, without pressured speech EYE: Conjunctivae pink, sclerae anicteric ENT: MMM CV: RR without R/Gs  RESP: CTAB posteriorly, without wheezing GI: NABS, soft, NT/ND, without rebound or guarding, no HSM appreciated MSK/EXT: No lower extremity edema SKIN: No jaundice, no spider angiomata NEURO:  Alert & Oriented x 3, no focal deficits,  no evidence of asterixis   REVIEW OF DATA  I reviewed the following data at the time of this encounter:  GI Procedures and Studies  Previously reviewed  Laboratory Studies  Reviewed those in epic  Imaging Studies  November 2020 MRI/MRCP IMPRESSION: Mild hepatic steatosis. No evidence of hepatic neoplasm. Prior cholecystectomy. No evidence of biliary obstruction or choledocholithiasis. 10 mm cystic lesion in pancreatic body with probable communication with the main pancreatic duct. This likely represents an indolent cystic neoplasm such as a side-branch IPMN. No further imaging follow-up is suggested given patient age. This recommendation follows ACR consensus guidelines: Management of Incidental Pancreatic Cysts: A White Paper of the ACR Incidental Findings Committee. [place] 4944;96:759-163.   ASSESSMENT  Ms. Goetzke is a 85 y.o. 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 Elevated alkaline phosphatase level   2. Dilated bile duct   3. Pancreatic cyst   4. Positive antismooth muscle antibody     The patient is clinically and hemodynamically stable.  She continues to have an elevation in her alkaline phosphatase of an unclear etiology.  With that being said we have found in her work-up a positive anti-smooth muscle antibody.  Interestingly, she has no transferase elevation which goes against the possibility of underlying autoimmune hepatitis but we have to keep this in the back of her mind.  The liver test abnormalities have persisted for over 6 months.  Based on the findings of her MRI/MRCP she also has a pancreatic cyst which is likely a branch duct IPMN and she has a dilated biliary tree.  The dilated biliary tree is most likely  postcholecystectomy but it is worthwhile to also consider the role of adenomatous polyp tissue that could be obstructing the biliary tree so evaluation of the ampulla is necessary.  I recommend a endoscopy with side-viewing endoscopy and endoscopic ultrasound to rule out an ampullary lesion and rule out microlithiasis or retained choledocholithiasis.  At the same time of her endoscopic ultrasound we can also consider a liver biopsy which may allow [place]  to ensure that she does not have autoimmune hepatitis.  The rate of tissue requirement in the setting of endoscopic ultrasound liver biopsy is noted to be good but may not be as good as a percutaneous biopsy site.  However since we are undergoing an endoscopic ultrasound for further evaluation of her alkaline phosphatase we can go ahead and try and do a liver biopsy as well.  The cyst that was noted in the pancreas is most likely a branch duct IPMN.  Since is the first time that we have evaluated receiving this I would recommend that we repeat an MRI/MRCP in 6 months.  I will be able to look at it with EUS and although there are no red flag symptoms on the current imaging if there are then I can consider sampling but it is unlikely that we will sample that at the time of EUS.  If at 28-monthMRI/MRCP things are stable then I would plan a 1 year follow-up MRI/MRCP and plan to follow this for 5 years at least to ensure that there are no significant changes.  We discussed that branch duct IPMN's have a low approximately 1% lifetime risk of developing pancreatic cancer is overall low risk but nonzero risk.  The risks and benefits of endoscopic evaluation were discussed with the patient; these include but are not limited to the risk of perforation, infection, bleeding, missed lesions, lack of diagnosis, severe illness requiring hospitalization, as well as anesthesia and sedation related illnesses.  The risks of EUS including bleeding, infection, aspiration pneumonia and  intestinal perforation were discussed as was the possibility it may not give a definitive diagnosis.  If a biopsy of the pancreas is done as part of the EUS, there is an additional risk of pancreatitis at the rate of about 1%.  It was explained that procedure related pancreatitis is typically mild, although can be severe and even life threatening, which is why we do not perform random pancreatic biopsies and only biopsy a lesion we feel is concerning enough to warrant the risk.  In the setting of also obtaining a liver biopsy there is a risk of bile peritonitis, bile leak, subcapsular hematoma development.  These are rare but need to be understood as well.  The patient is agreeable to proceed.  She may maintain her current PPI use versus H2 RA as she desires.  All patient questions were answered, to the best of my ability, and the patient agrees to the aforementioned plan of action with follow-up as indicated.   PLAN  Proceed with scheduling EUS for evaluation of biliary tree/ampulla/liver biopsy We will plan to repeat hepatic function panel at time of her procedure May continue PPI versus H2 RA as per patient desire At time of endoscopic ultrasound we will plan to obtain esophageal biopsies to rule out EOE and LOE and evaluate her stomach lining She will not be due for colon cancer screening until 2024 at minimum as long as her health remains excellent otherwise she may not require further screening colonoscopies based on her age   Orders Placed This Encounter  Procedures  . Procedural/ Surgical Case Request: UPPER ESOPHAGEAL ENDOSCOPIC ULTRASOUND (EUS)  . Ambulatory referral to Gastroenterology    New Prescriptions   No medications on file   Modified Medications   No medications on file    Planned Follow Up No follow-ups on file.   GJustice Britain MD [facility] # [phone]

## 2019-10-14 ENCOUNTER — Encounter: Payer: Self-pay | Admitting: Gastroenterology

## 2019-10-14 DIAGNOSIS — K862 Cyst of pancreas: Secondary | ICD-10-CM | POA: Insufficient documentation

## 2019-10-14 DIAGNOSIS — R748 Abnormal levels of other serum enzymes: Secondary | ICD-10-CM | POA: Insufficient documentation

## 2019-10-14 DIAGNOSIS — K838 Other specified diseases of biliary tract: Secondary | ICD-10-CM | POA: Insufficient documentation

## 2019-10-18 ENCOUNTER — Other Ambulatory Visit: Payer: Self-pay | Admitting: Internal Medicine

## 2019-11-04 ENCOUNTER — Other Ambulatory Visit: Payer: Self-pay | Admitting: Internal Medicine

## 2019-11-07 ENCOUNTER — Encounter: Payer: Self-pay | Admitting: Internal Medicine

## 2019-11-10 ENCOUNTER — Other Ambulatory Visit: Payer: Self-pay

## 2019-11-10 ENCOUNTER — Ambulatory Visit (INDEPENDENT_AMBULATORY_CARE_PROVIDER_SITE_OTHER): Payer: PPO | Admitting: Internal Medicine

## 2019-11-10 ENCOUNTER — Encounter: Payer: Self-pay | Admitting: Internal Medicine

## 2019-11-10 VITALS — BP 119/87

## 2019-11-10 DIAGNOSIS — E559 Vitamin D deficiency, unspecified: Secondary | ICD-10-CM | POA: Diagnosis not present

## 2019-11-10 DIAGNOSIS — E039 Hypothyroidism, unspecified: Secondary | ICD-10-CM | POA: Diagnosis not present

## 2019-11-10 DIAGNOSIS — I10 Essential (primary) hypertension: Secondary | ICD-10-CM

## 2019-11-10 DIAGNOSIS — R739 Hyperglycemia, unspecified: Secondary | ICD-10-CM | POA: Diagnosis not present

## 2019-11-10 DIAGNOSIS — R7303 Prediabetes: Secondary | ICD-10-CM

## 2019-11-10 NOTE — Progress Notes (Signed)
Subjective:    Patient ID: Angela Clay, female    DOB: 24-Jul-1934, 86 y.o.   MRN: [id number]  DOS:  11/10/2019 Type of visit - description: Virtual Visit via Video Note  I connected with the above patient  by a video enabled telemedicine application and verified that I am speaking with the correct person using two identifiers.   THIS ENCOUNTER IS A VIRTUAL VISIT DUE TO COVID-19 - PATIENT WAS NOT SEEN IN THE OFFICE. PATIENT HAS CONSENTED TO VIRTUAL VISIT / TELEMEDICINE VISIT   Location of patient: home  Location of provider: office  I discussed the limitations of evaluation and management by telemedicine and the availability of in person appointments. The patient expressed understanding and agreed to proceed.  Follow-up We discussed her multiple medical issues including increased LFTs, HTN, vitamin D deficiency, gout. I reviewed the notes from GI and Hematology     Review of Systems She feels well, good quarantine precautions, occasionally has some mild gout flareup mostly about the hands ( joints get slightly swollen and tender she takes colchicine for a couple of days and used on an ice pack)  Past Medical History:  Diagnosis Date  . Actinic keratosis 05/2015   Right mid central dorsal hand  . Gallstones   . GERD (gastroesophageal reflux disease)    EGD for dysphagia 2000 aprox (-)  . History of radiation therapy   . Hyperlipidemia   . Hypertension   . Hypothyroidism   . Lymphoma ([place])    neck,mlig,unspecified site dx 1970s  . OA (osteoarthritis)    severe at neck  . Osteopenia   . Syncope 11-2012   admited . w/u (-)  . Thyroid cancer [facility])     Past Surgical History:  Procedure Laterality Date  . APPENDECTOMY    . BELPHAROPTOSIS REPAIR Bilateral 2017  . CATARACT EXTRACTION, BILATERAL Bilateral 01/10/2019   Dr.Rankin  . CHOLECYSTECTOMY  02/24/06  . LEFT HEART CATH AND CORONARY ANGIOGRAPHY N/A 07/20/2018   Procedure: LEFT HEART CATH AND CORONARY ANGIOGRAPHY;   Surgeon: Belva Crome, MD;  Location: [facility];  Service: Cardiovascular;  Laterality: N/A;  . THYROIDECTOMY    . TONSILLECTOMY AND ADENOIDECTOMY  1949  . TUBAL LIGATION  1972  . vitamacular hole surgery Right 2020   eye    Social History   Socioeconomic History  . Marital status: Married    Spouse name: Iona Beard  . Number of children: 3  . Years of education: Not on file  . Highest education level: Not on file  Occupational History  . Occupation: retired  Tobacco Use  . Smoking status: Never Smoker  . Smokeless tobacco: Never Used  Substance and Sexual Activity  . Alcohol use: No    Alcohol/week: 0.0 standard drinks  . Drug use: No  . Sexual activity: Not Currently    Partners: Male    Birth control/protection: Post-menopausal  Other Topics Concern  . Not on file  Social History Narrative   3 kids, 8 GK   Lives w/ husband   Lost a g-child to suicide 2017      Social Determinants of Health   Financial Resource Strain:   . Difficulty of Paying Living Expenses: Not on file  Food Insecurity:   . Worried About Charity fundraiser in the Last Year: Not on file  . Ran Out of Food in the Last Year: Not on file  Transportation Needs:   . Lack of Transportation (Medical): Not on  file  . Lack of Transportation (Non-Medical): Not on file  Physical Activity:   . Days of Exercise per Week: Not on file  . Minutes of Exercise per Session: Not on file  Stress:   . Feeling of Stress : Not on file  Social Connections:   . Frequency of Communication with Friends and Family: Not on file  . Frequency of Social Gatherings with Friends and Family: Not on file  . Attends Religious Services: Not on file  . Active Member of Clubs or Organizations: Not on file  . Attends Archivist Meetings: Not on file  . Marital Status: Not on file  Intimate Partner Violence:   . Fear of Current or Ex-Partner: Not on file  . Emotionally Abused: Not on file  . Physically Abused:  Not on file  . Sexually Abused: Not on file      Allergies as of 11/10/2019      Reactions   Codeine Nausea And Vomiting      Medication List       Accurate as of November 10, 2019 11:59 PM. If you have any questions, ask your nurse or doctor.        STOP taking these medications   potassium chloride SA 20 MEQ tablet Commonly known as: KLOR-CON Stopped by: [name], MD     TAKE these medications   acetaminophen 500 MG tablet Commonly known as: TYLENOL Take 500 mg by mouth every 6 (six) hours as needed for mild pain.   aspirin 81 MG tablet Take 81 mg by mouth daily.   atorvastatin 40 MG tablet Commonly known as: LIPITOR Take 1 tablet (40 mg total) by mouth at bedtime.   Colchicine 0.6 MG Caps Commonly known as: Mitigare Take 1 capsule by mouth 2 (two) times daily as needed.   hydrochlorothiazide 25 MG tablet Commonly known as: HYDRODIURIL Take 1 tablet by mouth once daily   levothyroxine 112 MCG tablet Commonly known as: SYNTHROID Take 1 tablet (112 mcg total) by mouth daily before breakfast.   omeprazole 20 MG capsule Commonly known as: PRILOSEC Take 1 capsule (20 mg total) by mouth daily as needed.           Objective:   Physical Exam BP 119/87  She is alert oriented x3, in no apparent distress.  She seems to be doing very well, and in good spirits.    Assessment    Assessment Prediabetes Hyperlipidemia HTN Hypothyroidism Carotid artery disease [place] 11-2014: 60-79% RICA stenosis. 50-53% LICA stenosis [place] 06/7672: 1-39 % B. 1 year [place] 06/2017 1-39%, no further routine [place] DJD, severe at the neck GERD, dysphagia remotely Osteopenia: -s/p fosamax 2007 to 2012. -T score -1.7 (01-2015)  -T score -1.8 (04-2017) rx ca- vitamin D Lymphoma,  Neck, 1970s Syncope, admitted-2014, workup negative Shingles: 10/02/2016, left arm Chest pain: Cath 07/19/2018, negative, pericarditis?  PLAN: Prediabetes: Check A1c HTN: Seems well controlled, continue  hydrochlorothiazide.  She took potassium supplements temporarily, recheck a BMP. Hypothyroidism: On Synthroid, check a TSH Increase LFTs: Saw GI, 10/13/2019, note reviewed, scheduled to have a UEUS soon. Vitamin D deficiency: Finished ergocalciferol, now on bowel disease.  Check vitamin D Increased serum protein: Seen by hematology, SPEP did not show M spike.  Abnormal result was probably related to gout flareup.  RTC as needed.  Plan: Labs at her convenience RTC CPX 6 months   I discussed the assessment and treatment plan with the patient. The patient was provided an opportunity to ask  questions and all were answered. The patient agreed with the plan and demonstrated an understanding of the instructions.   The patient was advised to call back or seek an in-person evaluation if the symptoms worsen or if the condition fails to improve as anticipated.

## 2019-11-12 NOTE — Assessment & Plan Note (Signed)
Prediabetes: Check A1c HTN: Seems well controlled, continue hydrochlorothiazide.  She took potassium supplements temporarily, recheck a BMP. Hypothyroidism: On Synthroid, check a TSH Increase LFTs: Saw GI, 10/13/2019, note reviewed, scheduled to have a UEUS soon. Vitamin D deficiency: Finished ergocalciferol, now on bowel disease.  Check vitamin D Increased serum protein: Seen by hematology, SPEP did not show M spike.  Abnormal result was probably related to gout flareup.  RTC as needed.  Plan: Labs at her convenience RTC CPX 6 months

## 2019-11-14 ENCOUNTER — Telehealth: Payer: Self-pay | Admitting: Gastroenterology

## 2019-11-14 NOTE — Telephone Encounter (Signed)
Patient calling- scheduled for EUS on 1/27 at hospital with Dr. Rush Landmark- she just wants to check to make sure that is still going to happen. She knows somethings are getting cancelled and just wanted to follow up. States you can call her or leave her a message in my chart.

## 2019-11-14 NOTE — Telephone Encounter (Signed)
The pt has been advised that her procedure is still scheduled and we will call if anything changes

## 2019-11-19 ENCOUNTER — Other Ambulatory Visit (HOSPITAL_COMMUNITY)
Admission: RE | Admit: 2019-11-19 | Discharge: 2019-11-19 | Disposition: A | Discharge status: Home / Self Care | Payer: PPO | Source: Ambulatory Visit | Attending: Gastroenterology | Admitting: Gastroenterology

## 2019-11-19 DIAGNOSIS — Z01812 Encounter for preprocedural laboratory examination: Secondary | ICD-10-CM | POA: Diagnosis not present

## 2019-11-19 DIAGNOSIS — Z20822 Contact with and (suspected) exposure to covid-19: Secondary | ICD-10-CM | POA: Insufficient documentation

## 2019-11-19 LAB — SARS CORONAVIRUS 2 (TAT 6-24 HRS): SARS Coronavirus 2: NEGATIVE

## 2019-11-23 ENCOUNTER — Encounter (HOSPITAL_COMMUNITY)
Admission: RE | Disposition: A | Discharge status: Home / Self Care | Payer: Self-pay | Source: Ambulatory Visit | Attending: Gastroenterology

## 2019-11-23 ENCOUNTER — Ambulatory Visit (HOSPITAL_COMMUNITY): Payer: PPO | Admitting: Certified Registered Nurse Anesthetist

## 2019-11-23 ENCOUNTER — Encounter (HOSPITAL_COMMUNITY): Payer: Self-pay | Admitting: Gastroenterology

## 2019-11-23 ENCOUNTER — Other Ambulatory Visit: Payer: Self-pay

## 2019-11-23 ENCOUNTER — Ambulatory Visit (HOSPITAL_COMMUNITY)
Admission: RE | Admit: 2019-11-23 | Discharge: 2019-11-23 | Disposition: A | Discharge status: Home / Self Care | Payer: PPO | Source: Ambulatory Visit | Attending: Gastroenterology | Admitting: Gastroenterology

## 2019-11-23 DIAGNOSIS — K2980 Duodenitis without bleeding: Secondary | ICD-10-CM | POA: Diagnosis not present

## 2019-11-23 DIAGNOSIS — K2950 Unspecified chronic gastritis without bleeding: Secondary | ICD-10-CM | POA: Diagnosis not present

## 2019-11-23 DIAGNOSIS — K219 Gastro-esophageal reflux disease without esophagitis: Secondary | ICD-10-CM | POA: Insufficient documentation

## 2019-11-23 DIAGNOSIS — Z823 Family history of stroke: Secondary | ICD-10-CM | POA: Diagnosis not present

## 2019-11-23 DIAGNOSIS — K2970 Gastritis, unspecified, without bleeding: Secondary | ICD-10-CM | POA: Diagnosis not present

## 2019-11-23 DIAGNOSIS — Z9049 Acquired absence of other specified parts of digestive tract: Secondary | ICD-10-CM | POA: Diagnosis not present

## 2019-11-23 DIAGNOSIS — K7689 Other specified diseases of liver: Secondary | ICD-10-CM | POA: Diagnosis not present

## 2019-11-23 DIAGNOSIS — B3781 Candidal esophagitis: Secondary | ICD-10-CM | POA: Insufficient documentation

## 2019-11-23 DIAGNOSIS — M1990 Unspecified osteoarthritis, unspecified site: Secondary | ICD-10-CM | POA: Insufficient documentation

## 2019-11-23 DIAGNOSIS — R748 Abnormal levels of other serum enzymes: Secondary | ICD-10-CM | POA: Diagnosis not present

## 2019-11-23 DIAGNOSIS — Z923 Personal history of irradiation: Secondary | ICD-10-CM | POA: Insufficient documentation

## 2019-11-23 DIAGNOSIS — M8580 Other specified disorders of bone density and structure, unspecified site: Secondary | ICD-10-CM | POA: Insufficient documentation

## 2019-11-23 DIAGNOSIS — K3189 Other diseases of stomach and duodenum: Secondary | ICD-10-CM | POA: Diagnosis not present

## 2019-11-23 DIAGNOSIS — E785 Hyperlipidemia, unspecified: Secondary | ICD-10-CM | POA: Diagnosis not present

## 2019-11-23 DIAGNOSIS — Z8249 Family history of ischemic heart disease and other diseases of the circulatory system: Secondary | ICD-10-CM | POA: Diagnosis not present

## 2019-11-23 DIAGNOSIS — E890 Postprocedural hypothyroidism: Secondary | ICD-10-CM | POA: Diagnosis not present

## 2019-11-23 DIAGNOSIS — Z85850 Personal history of malignant neoplasm of thyroid: Secondary | ICD-10-CM | POA: Diagnosis not present

## 2019-11-23 DIAGNOSIS — I10 Essential (primary) hypertension: Secondary | ICD-10-CM | POA: Insufficient documentation

## 2019-11-23 DIAGNOSIS — K317 Polyp of stomach and duodenum: Secondary | ICD-10-CM

## 2019-11-23 DIAGNOSIS — Z885 Allergy status to narcotic agent status: Secondary | ICD-10-CM | POA: Diagnosis not present

## 2019-11-23 DIAGNOSIS — K862 Cyst of pancreas: Secondary | ICD-10-CM | POA: Insufficient documentation

## 2019-11-23 DIAGNOSIS — Z8049 Family history of malignant neoplasm of other genital organs: Secondary | ICD-10-CM | POA: Diagnosis not present

## 2019-11-23 HISTORY — PX: ESOPHAGOGASTRODUODENOSCOPY (EGD) WITH PROPOFOL: SHX5813

## 2019-11-23 HISTORY — PX: UPPER ESOPHAGEAL ENDOSCOPIC ULTRASOUND (EUS): SHX6562

## 2019-11-23 HISTORY — PX: BIOPSY: SHX5522

## 2019-11-23 LAB — HEPATIC FUNCTION PANEL
ALT: 33 U/L (ref 0–44)
AST: 44 U/L — ABNORMAL HIGH (ref 15–41)
Albumin: 3.7 g/dL (ref 3.5–5.0)
Alkaline Phosphatase: 195 U/L — ABNORMAL HIGH (ref 38–126)
Bilirubin, Direct: 0.3 mg/dL — ABNORMAL HIGH (ref 0.0–0.2)
Indirect Bilirubin: 1.1 mg/dL — ABNORMAL HIGH (ref 0.3–0.9)
Total Bilirubin: 1.4 mg/dL — ABNORMAL HIGH (ref 0.3–1.2)
Total Protein: 8.7 g/dL — ABNORMAL HIGH (ref 6.5–8.1)

## 2019-11-23 SURGERY — UPPER ESOPHAGEAL ENDOSCOPIC ULTRASOUND (EUS)
Anesthesia: Monitor Anesthesia Care

## 2019-11-23 MED ORDER — SODIUM CHLORIDE 0.9 % IV SOLN: INTRAVENOUS | Status: DC

## 2019-11-23 MED ORDER — PROPOFOL 1000 MG/100ML IV EMUL
INTRAVENOUS | Status: AC
  Filled 2019-11-23: qty 100

## 2019-11-23 MED ORDER — PROPOFOL 500 MG/50ML IV EMUL
INTRAVENOUS | Status: DC | PRN
  Administered 2019-11-23: 125 ug/kg/min via INTRAVENOUS

## 2019-11-23 MED ORDER — PROPOFOL 10 MG/ML IV BOLUS
INTRAVENOUS | Status: DC | PRN
  Administered 2019-11-23 (×4): 10 mg via INTRAVENOUS

## 2019-11-23 MED ORDER — CIPROFLOXACIN IN D5W 400 MG/200ML IV SOLN
INTRAVENOUS | Status: AC
  Filled 2019-11-23: qty 200

## 2019-11-23 MED ORDER — LACTATED RINGERS IV SOLN
INTRAVENOUS | Status: AC | PRN
  Administered 2019-11-23: 1000 mL via INTRAVENOUS

## 2019-11-23 MED ORDER — ONDANSETRON HCL 4 MG/2ML IJ SOLN
INTRAMUSCULAR | Status: DC | PRN
  Administered 2019-11-23: 4 mg via INTRAVENOUS

## 2019-11-23 MED ORDER — LIDOCAINE 2% (20 MG/ML) 5 ML SYRINGE
INTRAMUSCULAR | Status: DC | PRN
  Administered 2019-11-23: 60 mg via INTRAVENOUS

## 2019-11-23 NOTE — Discharge Instructions (Signed)
YOU HAD AN ENDOSCOPIC PROCEDURE TODAY: Refer to the procedure report and other information in the discharge instructions given to you for any specific questions about what was found during the examination. If this information does not answer your questions, please call [place] office at [phone] to clarify.  ° °YOU SHOULD EXPECT: Some feelings of bloating in the abdomen. Passage of more gas than usual. Walking can help get rid of the air that was put into your GI tract during the procedure and reduce the bloating. If you had a lower endoscopy (such as a colonoscopy or flexible sigmoidoscopy) you may notice spotting of blood in your stool or on the toilet paper. Some abdominal soreness may be present for a day or two, also. ° °DIET: Your first meal following the procedure should be a light meal and then it is ok to progress to your normal diet. A half-sandwich or bowl of soup is an example of a good first meal. Heavy or fried foods are harder to digest and may make you feel nauseous or bloated. Drink plenty of fluids but you should avoid alcoholic beverages for 24 hours. If you had a esophageal dilation, please see attached instructions for diet.   ° °ACTIVITY: Your care partner should take you home directly after the procedure. You should plan to take it easy, moving slowly for the rest of the day. You can resume normal activity the day after the procedure however YOU SHOULD NOT DRIVE, use power tools, machinery or perform tasks that involve climbing or major physical exertion for 24 hours (because of the sedation medicines used during the test).  ° °SYMPTOMS TO REPORT IMMEDIATELY: °A gastroenterologist can be reached at any hour. Please call [phone]  for any of the following symptoms:  °Following lower endoscopy (colonoscopy, flexible sigmoidoscopy) °Excessive amounts of blood in the stool  °Significant tenderness, worsening of abdominal pains  °Swelling of the abdomen that is new, acute  °Fever of 100° or  higher  °Following upper endoscopy (EGD, EUS, ERCP, esophageal dilation) °Vomiting of blood or coffee ground material  °New, significant abdominal pain  °New, significant chest pain or pain under the shoulder blades  °Painful or persistently difficult swallowing  °New shortness of breath  °Black, tarry-looking or red, bloody stools ° °FOLLOW UP:  °If any biopsies were taken you will be contacted by phone or by letter within the next 1-3 weeks. Call [phone]  if you have not heard about the biopsies in 3 weeks.  °Please also call with any specific questions about appointments or follow up tests. ° °

## 2019-11-23 NOTE — H&P (Signed)
GASTROENTEROLOGY PROCEDURE H&P NOTE   Primary Care Physician: Colon Branch, MD  HPI: Angela Clay is a 86 y.o. female who presents for EGD/EUS with possible FNB.  Past Medical History:  Diagnosis Date  . Actinic keratosis 05/2015   Right mid central dorsal hand  . Gallstones   . GERD (gastroesophageal reflux disease)    EGD for dysphagia 2000 aprox (-)  . History of radiation therapy   . Hyperlipidemia   . Hypertension   . Hypothyroidism   . Lymphoma ([place])    neck,mlig,unspecified site dx 1970s  . OA (osteoarthritis)    severe at neck  . Osteopenia   . Syncope 11-2012   admited . w/u (-)  . Thyroid cancer [facility])    Past Surgical History:  Procedure Laterality Date  . APPENDECTOMY    . BELPHAROPTOSIS REPAIR Bilateral 2017  . CATARACT EXTRACTION, BILATERAL Bilateral 01/10/2019   Dr.Rankin  . CHOLECYSTECTOMY  02/24/06  . LEFT HEART CATH AND CORONARY ANGIOGRAPHY N/A 07/20/2018   Procedure: LEFT HEART CATH AND CORONARY ANGIOGRAPHY;  Surgeon: Belva Crome, MD;  Location: [facility];  Service: Cardiovascular;  Laterality: N/A;  . THYROIDECTOMY    . TONSILLECTOMY AND ADENOIDECTOMY  1949  . TUBAL LIGATION  1972  . vitamacular hole surgery Right 2020   eye   Current Facility-Administered Medications  Medication Dose Route Frequency Provider Last Rate Last Admin  . 0.9 %  sodium chloride infusion   Intravenous Continuous Mansouraty, Telford Nab., MD      . lactated ringers infusion    Continuous PRN Mansouraty, Telford Nab., MD 10 mL/hr at 11/23/19 0722 1,000 mL at 11/23/19 9532   Allergies  Allergen Reactions  . Codeine Nausea And Vomiting   Family History  Problem Relation Age of Onset  . Stroke Mother 79  . Coronary artery disease Father        MI age 53  . Deep vein thrombosis Brother        PE at age 18  . Uterine cancer Sister   . Heart failure Sister   . Diabetes Neg Hx   . Colon cancer Neg Hx   . Breast cancer Neg Hx   . Inflammatory bowel disease  Neg Hx   . Liver disease Neg Hx   . Pancreatic cancer Neg Hx   . Rectal cancer Neg Hx   . Stomach cancer Neg Hx    Social History   Socioeconomic History  . Marital status: Married    Spouse name: Iona Beard  . Number of children: 3  . Years of education: Not on file  . Highest education level: Not on file  Occupational History  . Occupation: retired  Tobacco Use  . Smoking status: Never Smoker  . Smokeless tobacco: Never Used  Substance and Sexual Activity  . Alcohol use: No    Alcohol/week: 0.0 standard drinks  . Drug use: No  . Sexual activity: Not Currently    Partners: Male    Birth control/protection: Post-menopausal  Other Topics Concern  . Not on file  Social History Narrative   3 kids, 8 GK   Lives w/ husband   Lost a g-child to suicide 2017      Social Determinants of Health   Financial Resource Strain:   . Difficulty of Paying Living Expenses: Not on file  Food Insecurity:   . Worried About Charity fundraiser in the Last Year: Not on file  . Ran Out of  Food in the Last Year: Not on file  Transportation Needs:   . Lack of Transportation (Medical): Not on file  . Lack of Transportation (Non-Medical): Not on file  Physical Activity:   . Days of Exercise per Week: Not on file  . Minutes of Exercise per Session: Not on file  Stress:   . Feeling of Stress : Not on file  Social Connections:   . Frequency of Communication with Friends and Family: Not on file  . Frequency of Social Gatherings with Friends and Family: Not on file  . Attends Religious Services: Not on file  . Active Member of Clubs or Organizations: Not on file  . Attends Archivist Meetings: Not on file  . Marital Status: Not on file  Intimate Partner Violence:   . Fear of Current or Ex-Partner: Not on file  . Emotionally Abused: Not on file  . Physically Abused: Not on file  . Sexually Abused: Not on file    Physical Exam: Vital signs in last 24 hours: Temp:  [98.1 F (36.7  C)] 98.1 F (36.7 C) (01/27 0650) Pulse Rate:  [67] 67 (01/27 0650) Resp:  [13] 13 (01/27 0650) BP: (141)/(61) 141/61 (01/27 0650) SpO2:  [97 %] 97 % (01/27 0650) Weight:  [68 kg] 68 kg (01/27 0650)   GEN: NAD EYE: Sclerae anicteric ENT: MMM CV: Non-tachycardic GI: Soft, NT/ND NEURO:  Alert & Oriented x 3  Lab Results: No results for input(s): WBC, HGB, HCT, PLT in the last 72 hours. BMET No results for input(s): NA, K, CL, CO2, GLUCOSE, BUN, CREATININE, CALCIUM in the last 72 hours. LFT No results for input(s): PROT, ALBUMIN, AST, ALT, ALKPHOS, BILITOT, BILIDIR, IBILI in the last 72 hours. PT/INR No results for input(s): LABPROT, INR in the last 72 hours.   Impression / Plan: This is a 86 y.o.female who presents for EGD/EUS with possible FNB.  The risks of EUS including bleeding, infection, aspiration pneumonia and intestinal perforation were discussed as was the possibility it may not give a definitive diagnosis.  If a biopsy of the pancreas is done as part of the EUS, there is an additional risk of pancreatitis at the rate of about 1%.  It was explained that procedure related pancreatitis is typically mild, although can be severe and even life threatening, which is why we do not perform random pancreatic biopsies and only biopsy a lesion we feel is concerning enough to warrant the risk.  Possible Liver biopsy with risks of bile peritonitis, bile leak, subcapsular hematoma, intraperitoneal bleeding.  The risks and benefits of endoscopic evaluation were discussed with the patient; these include but are not limited to the risk of perforation, infection, bleeding, missed lesions, lack of diagnosis, severe illness requiring hospitalization, as well as anesthesia and sedation related illnesses.  The patient is agreeable to proceed.    Justice Britain, MD [facility] Office # [phone]

## 2019-11-23 NOTE — Anesthesia Preprocedure Evaluation (Signed)
Anesthesia Evaluation  Patient identified by MRN, date of birth, ID band Patient awake    Reviewed: Allergy & Precautions, H&P , NPO status , Patient's Chart, lab work & pertinent test results  Airway Mallampati: II   Neck ROM: full    Dental   Pulmonary neg pulmonary ROS,    breath sounds clear to auscultation       Cardiovascular hypertension,  Rhythm:regular Rate:Normal     Neuro/Psych    GI/Hepatic GERD  ,  Endo/Other  Hypothyroidism H/o thyroid CA  Renal/GU      Musculoskeletal  (+) Arthritis ,   Abdominal   Peds  Hematology   Anesthesia Other Findings   Reproductive/Obstetrics                             Anesthesia Physical Anesthesia Plan  ASA: II  Anesthesia Plan: MAC   Post-op Pain Management:    Induction: Intravenous  PONV Risk Score and Plan: 2 and Ondansetron, Propofol infusion and Treatment may vary due to age or medical condition  Airway Management Planned: Nasal Cannula  Additional Equipment:   Intra-op Plan:   Post-operative Plan:   Informed Consent: I have reviewed the patients History and Physical, chart, labs and discussed the procedure including the risks, benefits and alternatives for the proposed anesthesia with the patient or authorized representative who has indicated his/her understanding and acceptance.       Plan Discussed with: CRNA, Anesthesiologist and Surgeon  Anesthesia Plan Comments:         Anesthesia Quick Evaluation

## 2019-11-23 NOTE — Transfer of Care (Signed)
Immediate Anesthesia Transfer of Care Note  Patient: Angela Clay  Procedure(s) Performed: UPPER ESOPHAGEAL ENDOSCOPIC ULTRASOUND (EUS) (N/A ) BIOPSY  Patient Location: PACU and Endoscopy Unit  Anesthesia Type:MAC  Level of Consciousness: awake, alert  and oriented  Airway & Oxygen Therapy: Patient Spontanous Breathing and Patient connected to face mask oxygen  Post-op Assessment: Report given to RN and Post -op Vital signs reviewed and stable  Post vital signs: Reviewed and stable  Last Vitals:  Vitals Value Taken Time  BP    Temp    Pulse 54 11/23/19 0831  Resp 11 11/23/19 0831  SpO2 100 % 11/23/19 0831  Vitals shown include unvalidated device data.  Last Pain:  Vitals:   11/23/19 0650  TempSrc: Oral  PainSc: 0-No pain         Complications: No apparent anesthesia complications

## 2019-11-23 NOTE — Op Note (Signed)
[facility] Patient Name: Angela Clay Procedure Date: 11/23/2019 MRN: [id number] Attending MD: Gabriel Mansouraty , MD Date of Birth: 10/03/1934 CSN: [id number] Age: 86 Admit Type: Outpatient Procedure:                Upper EUS Indications:              Pancreatic cyst on MRCP, Abnormal MRCP, Elevated                            alkaline phosphatase, History of Cholecystectomy Providers:                Gabriel Mansouraty, MD, Patricia Ford, RN,                            Guillaume Awaka, Technician Referring MD:              Medicines:                Monitored Anesthesia Care Complications:            No immediate complications. Estimated Blood Loss:     Estimated blood loss was minimal. Procedure:                Pre-Anesthesia Assessment:                           - Prior to the procedure, a History and Physical                            was performed, and patient medications and                            allergies were reviewed. The patient's tolerance of                            previous anesthesia was also reviewed. The risks                            and benefits of the procedure and the sedation                            options and risks were discussed with the patient.                            All questions were answered, and informed consent                            was obtained. [Anticoagulant Agents] [Days Prior to                            Procedure]. [ASA Grade]. After reviewing the risks                            and benefits, the patient was deemed in                              satisfactory condition to undergo the procedure.                           After obtaining informed consent, the endoscope was                            passed under direct vision. Throughout the                            procedure, the patient's blood pressure, pulse, and                            oxygen saturations were monitored continuously. The                             [id number]-H190 ([id number]) Olympus gastroscope was                            introduced through the mouth, and advanced to the                            second part of duodenum. The [id number]-Q190V ([id number])                            Olympus duodenoscope was introduced through the                            mouth, and advanced to the second part of duodenum.                            The GF-UTC180 ([id number]) Olympus Linear EUS was                            introduced through the mouth, and advanced to the                            duodenum for ultrasound examination from the                            stomach and duodenum. The upper EUS was                            accomplished without difficulty. The patient                            tolerated the procedure. Scope In: Scope Out: Findings:      ENDOSCOPIC FINDING: :      No gross lesions were noted in the proximal esophagus.      White nummular lesions were noted in the mid esophagus. Biopsies were       taken with a cold forceps for histology to rule out Candida.      No gross lesions were noted in the distal esophagus.      The Z-line was regular and was found 38 cm from the incisors.        Patchy mildly erythematous mucosa without bleeding was found in the       gastric body.      Multiple small sessile polyps with no bleeding and no stigmata of recent       bleeding were found in the cardia, in the gastric fundus and in the       gastric body - these are most consistent with fundic gland polyps.       Biopsies were taken with a cold forceps for histology to rule out       adenomatous change from 5 of these.      No other gross lesions were noted in the entire examined stomach.       Biopsies were taken with a cold forceps for histology and Helicobacter       pylori testing.      A single 3 mm sessile polyp with no bleeding was found in the duodenal       bulb. The polyp was removed with a cold biopsy forceps. Resection and        retrieval were complete to rule out adenomatous change.      No other gross mucosal lesions were noted in the duodenal bulb, in the       first portion of the duodenum and in the second portion of the duodenum.       Biopsies were taken with a cold forceps for histology to rule out Celiac.      The major papilla was normal but was hidden under a hood.      ENDOSONOGRAPHIC FINDING: :      Endosonographic imaging in the common bile duct (2.2 mm -> 4.8 mm) and       in the common hepatic duct (8.5 mm) showed no stones, sludge or       stricture.      An anechoic lesion suggestive of a cyst was identified in the pancreatic       body. It communicates with the pancreatic duct. The lesion measured 10.4       mm by 6.2 mm in maximal cross-sectional diameter. There was a single       compartment without septae. The outer wall of the lesion was thin. There       was no associated mass. There was no internal debris within the       fluid-filled cavity.      There was no other sign of significant endosonographic abnormality in       the pancreatic head (1.6 mm -> 2.0 mm), genu of the pancreas (2.7 mm ->       1.5 mm), pancreatic body (2.2 mm) and pancreatic tail (2.2 mm). No       calcifications, the pancreatic duct was regular in contour.      Endosonographic imaging of the ampulla showed no intramural       (subepithelial) lesion.      There was a heterogenous echotexture in the visualized portion of the       liver. No masses or lesions were noted throughout the visualized liver       portion. We did not have a functional 19g FNB needle to use and as I did       not want to get a low-yield core biopsy of the liver, I decided to hold       on attempt with a 22g FNB or 19g FNA needle.      No malignant-appearing  lymph nodes were visualized in the celiac region       (level 20), perigastric region, peripancreatic region and porta hepatis       region.      The celiac region was  visualized. Impression:               EGD Impression:                           - No gross lesions in esophagus proximally. White                            nummular lesions in esophageal mucosa in                            mid-esophagus - biopsied. No gross lesions in                            esophagus distally. Z-line regular, 38 cm from the                            incisors.                           - Multiple gastric polyps - likely fundic gland.                            Biopsied. Erythematous mucosa in the gastric body.                            No other gross lesions in the stomach. Biopsied for                            HP.                           - A single duodenal polyp. Resected and retrieved.                            No other gross mucosal lesions in the duodenal                            bulb, in the first portion of the duodenum and in                            the second portion of the duodenum. Biopsied for                            Celiac.                           - Normal major papilla under a hood.                           EUS Impression:                           -  A cystic lesion was seen in the pancreatic body.                            Tissue has not been obtained. However, the                            endosonographic appearance is consistent with a                            branched intraductal papillary mucinous neoplasm in                            similar nature to prior MRICP imaging.                           - There was no other sign of significant pathology                            in the pancreatic head, genu of the pancreas,                            pancreatic body and pancreatic tail.                           - There was a heterogenous echotexture in the                            visualized portion of the liver. No masses or                            lesions.                           - No evidence of choledocholithiasis or  ampullary                            lesion.                           - No malignant-appearing lymph nodes were                            visualized in the celiac region (level 20),                            perigastric region, peripancreatic region and porta                            hepatis region. Moderate Sedation:      Not Applicable - Patient had care per Anesthesia. Recommendation:           - The patient will be observed post-procedure,                            until all discharge criteria are met.                           -  Discharge patient to home.                           - Patient has a contact number available for                            emergencies. The signs and symptoms of potential                            delayed complications were discussed with the                            patient. Return to normal activities tomorrow.                            Written discharge instructions were provided to the                            patient.                           - Observe patient's clinical course.                           - Await path results.                           - Check liver enzymes (AST, ALT, alkaline                            phosphatase, bilirubin) in 1-2 months, but as they                            have been elevated for >6 months in regards to AP,                            biopsy will be needed most likely. If patient would                            like to proceed now or after next set of labs,                            likely will place referral for Percutaneous Liver                            biopsy with Radiology under U/S guidance at                            patient's convenience, in order to ensure a proper                            Core biopsy is obtained to further guide evaluation  of elevated Alkaline Phosphatase and Positive ASMA.                           - The findings and recommendations were  discussed                            with the patient.                           - The findings and recommendations were discussed                            with the patient's family. Procedure Code(s):        --- Professional ---                           43237, Esophagogastroduodenoscopy, flexible,                            transoral; with endoscopic ultrasound examination                            limited to the esophagus, stomach or duodenum, and                            adjacent structures                           43239, Esophagogastroduodenoscopy, flexible,                            transoral; with biopsy, single or multiple Diagnosis Code(s):        --- Professional ---                           K22.8, Other specified diseases of esophagus                           K31.89, Other diseases of stomach and duodenum                           K31.7, Polyp of stomach and duodenum                           K86.2, Cyst of pancreas                           R93.2, Abnormal findings on diagnostic imaging of                            liver and biliary tract                           I89.9, Noninfective disorder of lymphatic vessels                            and lymph nodes, unspecified                             R74.8, Abnormal levels of other serum enzymes CPT copyright 2019 American Medical Association. All rights reserved. The codes documented in this report are preliminary and upon coder review may  be revised to meet current compliance requirements. Gabriel Mansouraty, MD 11/23/2019 8:50:33 AM Number of Addenda: 0 

## 2019-11-24 ENCOUNTER — Encounter: Payer: Self-pay | Admitting: *Deleted

## 2019-11-24 ENCOUNTER — Other Ambulatory Visit: Payer: Self-pay

## 2019-11-24 LAB — SURGICAL PATHOLOGY

## 2019-11-27 ENCOUNTER — Encounter: Payer: Self-pay | Admitting: Gastroenterology

## 2019-11-28 ENCOUNTER — Other Ambulatory Visit: Payer: Self-pay

## 2019-11-28 MED ORDER — FLUCONAZOLE 200 MG PO TABS: ORAL_TABLET | ORAL | 0 refills | Status: AC

## 2019-11-29 NOTE — Anesthesia Postprocedure Evaluation (Signed)
Anesthesia Post Note  Patient: Angela Clay  Procedure(s) Performed: UPPER ESOPHAGEAL ENDOSCOPIC ULTRASOUND (EUS) (N/A ) BIOPSY ESOPHAGOGASTRODUODENOSCOPY (EGD) WITH PROPOFOL (N/A )     Patient location during evaluation: Endoscopy Anesthesia Type: MAC Level of consciousness: awake and alert Pain management: pain level controlled Vital Signs Assessment: post-procedure vital signs reviewed and stable Respiratory status: spontaneous breathing, nonlabored ventilation, respiratory function stable and patient connected to nasal cannula oxygen Cardiovascular status: blood pressure returned to baseline and stable Postop Assessment: no apparent nausea or vomiting Anesthetic complications: no    Last Vitals:  Vitals:   11/23/19 0900 11/23/19 0910  BP: 136/64 (!) 153/60  Pulse: (!) 53 (!) 57  Resp: 15 17  Temp:    SpO2: 97% 97%    Last Pain:  Vitals:   11/24/19 0924  TempSrc:   PainSc: 0-No pain                 Sakai Heinle S

## 2019-11-30 ENCOUNTER — Other Ambulatory Visit: Payer: Self-pay

## 2019-11-30 ENCOUNTER — Other Ambulatory Visit (INDEPENDENT_AMBULATORY_CARE_PROVIDER_SITE_OTHER): Payer: PPO

## 2019-11-30 DIAGNOSIS — I10 Essential (primary) hypertension: Secondary | ICD-10-CM | POA: Diagnosis not present

## 2019-11-30 DIAGNOSIS — E039 Hypothyroidism, unspecified: Secondary | ICD-10-CM

## 2019-11-30 DIAGNOSIS — E559 Vitamin D deficiency, unspecified: Secondary | ICD-10-CM

## 2019-11-30 DIAGNOSIS — R7303 Prediabetes: Secondary | ICD-10-CM | POA: Diagnosis not present

## 2019-11-30 LAB — BASIC METABOLIC PANEL
BUN: 15 mg/dL (ref 6–23)
CO2: 31 mEq/L (ref 19–32)
Calcium: 9.8 mg/dL (ref 8.4–10.5)
Chloride: 98 mEq/L (ref 96–112)
Creatinine, Ser: 0.82 mg/dL (ref 0.40–1.20)
GFR: 66.41 mL/min (ref >60.00)
Glucose, Bld: 97 mg/dL (ref 70–99)
Potassium: 4.2 mEq/L (ref 3.5–5.1)
Sodium: 136 mEq/L (ref 135–145)

## 2019-11-30 LAB — HEMOGLOBIN A1C: Hgb A1c MFr Bld: 6 % (ref 4.6–6.5)

## 2019-11-30 LAB — TSH: TSH: 0.47 u[IU]/mL (ref 0.35–4.50)

## 2019-11-30 LAB — VITAMIN D 25 HYDROXY (VIT D DEFICIENCY, FRACTURES): VITD: 26.99 ng/mL — ABNORMAL LOW (ref 30.00–100.00)

## 2019-12-02 DIAGNOSIS — Z1231 Encounter for screening mammogram for malignant neoplasm of breast: Secondary | ICD-10-CM | POA: Diagnosis not present

## 2019-12-02 LAB — HM MAMMOGRAPHY

## 2019-12-02 MED ORDER — VITAMIN D (ERGOCALCIFEROL) 1.25 MG (50000 UNIT) PO CAPS: 50000.0000 [IU] | ORAL_CAPSULE | ORAL | 0 refills | Status: DC

## 2019-12-02 NOTE — Addendum Note (Signed)
Addended byDamita Dunnings D on: 12/02/2019 12:48 PM   Modules accepted: Orders

## 2019-12-07 ENCOUNTER — Encounter: Payer: Self-pay | Admitting: Internal Medicine

## 2019-12-08 ENCOUNTER — Ambulatory Visit: Payer: PPO | Admitting: Cardiovascular Disease

## 2019-12-08 ENCOUNTER — Other Ambulatory Visit: Payer: Self-pay

## 2019-12-08 ENCOUNTER — Encounter: Payer: Self-pay | Admitting: Cardiovascular Disease

## 2019-12-08 VITALS — BP 102/64 | HR 78 | Ht 63.0 in | Wt 147.0 lb

## 2019-12-08 DIAGNOSIS — E782 Mixed hyperlipidemia: Secondary | ICD-10-CM

## 2019-12-08 DIAGNOSIS — I10 Essential (primary) hypertension: Secondary | ICD-10-CM

## 2019-12-08 NOTE — Patient Instructions (Signed)
Medication Instructions:  Your provider recommends that you continue on your current medications as directed. Please refer to the Current Medication list given to you today.   *If you need a refill on your cardiac medications before your next appointment, please call your pharmacy*  Follow-Up: Please call us if you need anything!

## 2019-12-08 NOTE — Progress Notes (Signed)
Cardiology Office Note:    Date:  12/08/2019   ID:  Angela Clay, DOB August 26, 1934, MRN [id number]  PCP:  Colon Branch, MD  Cardiologist:  Sherren Mocha, MD  Electrophysiologist:  None   Referring MD: Colon Branch, MD   Chief Complaint  Patient presents with  . Follow-up    History of pericarditis    History of Present Illness:    Angela Clay is a 86 y.o. female with a hx of chest pain in 2019, ultimately suspected to have acute pericarditis.  She was treated with colchicine with complete resolution of her symptoms.  She was noted to have an elevated sedimentation rate and CRP at the time of her diagnosis.  She did undergo cardiac catheterization demonstrating mild nonobstructive CAD.  She continues to do very well from a cardiac perspective.  She denies symptoms of recurrent chest pain, chest pressure, heart palpitations, orthopnea, PND, or lightheadedness.  She follows closely with Dr. Larose Kells.  She is currently undergoing evaluation for elevated LFTs.  Past Medical History:  Diagnosis Date  . Actinic keratosis 05/2015   Right mid central dorsal hand  . Gallstones   . GERD (gastroesophageal reflux disease)    EGD for dysphagia 2000 aprox (-)  . History of radiation therapy   . Hyperlipidemia   . Hypertension   . Hypothyroidism   . Lymphoma ([place])    neck,mlig,unspecified site dx 1970s  . OA (osteoarthritis)    severe at neck  . Osteopenia   . Syncope 11-2012   admited . w/u (-)  . Thyroid cancer [facility])     Past Surgical History:  Procedure Laterality Date  . APPENDECTOMY    . BELPHAROPTOSIS REPAIR Bilateral 2017  . BIOPSY  11/23/2019   Procedure: BIOPSY;  Surgeon: Rush Landmark Telford Nab., MD;  Location: [facility];  Service: Gastroenterology;;  . CATARACT EXTRACTION, BILATERAL Bilateral 01/10/2019   Dr.Rankin  . CHOLECYSTECTOMY  02/24/06  . ESOPHAGOGASTRODUODENOSCOPY (EGD) WITH PROPOFOL N/A 11/23/2019   Procedure: ESOPHAGOGASTRODUODENOSCOPY (EGD) WITH PROPOFOL;   Surgeon: Rush Landmark Telford Nab., MD;  Location: [facility];  Service: Gastroenterology;  Laterality: N/A;  . LEFT HEART CATH AND CORONARY ANGIOGRAPHY N/A 07/20/2018   Procedure: LEFT HEART CATH AND CORONARY ANGIOGRAPHY;  Surgeon: Belva Crome, MD;  Location: [facility];  Service: Cardiovascular;  Laterality: N/A;  . THYROIDECTOMY    . TONSILLECTOMY AND ADENOIDECTOMY  1949  . TUBAL LIGATION  1972  . UPPER ESOPHAGEAL ENDOSCOPIC ULTRASOUND (EUS) N/A 11/23/2019   Procedure: UPPER ESOPHAGEAL ENDOSCOPIC ULTRASOUND (EUS);  Surgeon: Irving Copas., MD;  Location: [facility];  Service: Gastroenterology;  Laterality: N/A;  WITH BIOPSY   NEEDS 90 MINUTES  . vitamacular hole surgery Right 2020   eye    Current Medications: Current Meds  Medication Sig  . acetaminophen (TYLENOL) 500 MG tablet Take 500 mg by mouth every 6 (six) hours as needed for mild pain.   Marland Kitchen aspirin 81 MG tablet Take 81 mg by mouth at bedtime.   Marland Kitchen atorvastatin (LIPITOR) 40 MG tablet Take 1 tablet (40 mg total) by mouth at bedtime.  . calcium-vitamin D (OSCAL WITH D) 250-125 MG-UNIT tablet Take 1 tablet by mouth daily.  . Cholecalciferol (VITAMIN D3) 25 MCG (1000 UT) CAPS Take 25 mcg by mouth daily.  . Colchicine (MITIGARE) 0.6 MG CAPS Take 1 capsule by mouth 2 (two) times daily as needed.  . fluconazole (DIFLUCAN) 200 MG tablet Take 2 tablets (400 mg total) by mouth daily  for 1 day, THEN 1 tablet (200 mg total) daily for 13 days.  . hydrochlorothiazide (HYDRODIURIL) 25 MG tablet Take 1 tablet by mouth once daily  . levothyroxine (SYNTHROID) 112 MCG tablet Take 1 tablet (112 mcg total) by mouth daily before breakfast.  . Polyethyl Glycol-Propyl Glycol (SYSTANE ULTRA OP) Place 1 drop into both eyes daily as needed (Dry eyes).  . Vitamin D, Ergocalciferol, (DRISDOL) 1.25 MG (50000 UNIT) CAPS capsule Take 1 capsule (50,000 Units total) by mouth every 7 (seven) days.     Allergies:   Codeine   Social History    Socioeconomic History  . Marital status: Married    Spouse name: Iona Beard  . Number of children: 3  . Years of education: Not on file  . Highest education level: Not on file  Occupational History  . Occupation: retired  Tobacco Use  . Smoking status: Never Smoker  . Smokeless tobacco: Never Used  Substance and Sexual Activity  . Alcohol use: No    Alcohol/week: 0.0 standard drinks  . Drug use: No  . Sexual activity: Not Currently    Partners: Male    Birth control/protection: Post-menopausal  Other Topics Concern  . Not on file  Social History Narrative   3 kids, 8 GK   Lives w/ husband   Lost a g-child to suicide 2017      Social Determinants of Health   Financial Resource Strain:   . Difficulty of Paying Living Expenses: Not on file  Food Insecurity:   . Worried About Charity fundraiser in the Last Year: Not on file  . Ran Out of Food in the Last Year: Not on file  Transportation Needs:   . Lack of Transportation (Medical): Not on file  . Lack of Transportation (Non-Medical): Not on file  Physical Activity:   . Days of Exercise per Week: Not on file  . Minutes of Exercise per Session: Not on file  Stress:   . Feeling of Stress : Not on file  Social Connections:   . Frequency of Communication with Friends and Family: Not on file  . Frequency of Social Gatherings with Friends and Family: Not on file  . Attends Religious Services: Not on file  . Active Member of Clubs or Organizations: Not on file  . Attends Archivist Meetings: Not on file  . Marital Status: Not on file     Family History: The patient's family history includes Coronary artery disease in her father; Deep vein thrombosis in her brother; Heart failure in her sister; Stroke (age of onset: 60) in her mother; Uterine cancer in her sister. There is no history of Diabetes, Colon cancer, Breast cancer, Inflammatory bowel disease, Liver disease, Pancreatic cancer, Rectal cancer, or Stomach  cancer.  ROS:   Please see the history of present illness.    All other systems reviewed and are negative.  EKGs/Labs/Other Studies Reviewed:    The following studies were reviewed today: Echocardiogram 07/20/2018: Study Conclusions   - Left ventricle: The cavity size was normal. Systolic function was  normal. The estimated ejection fraction was in the range of 60%  to 65%. Wall motion was normal; there were no regional wall  motion abnormalities. Left ventricular diastolic function  parameters were normal.  - Aortic valve: There was trivial regurgitation.  - Atrial septum: There was increased thickness of the septum,  consistent with lipomatous hypertrophy.  - Tricuspid valve: There was trivial regurgitation.  - Pulmonic valve: There was trivial  regurgitation.   Cardiac catheterization 07/20/2018: Conclusion   Cine fluoroscopy demonstrates no evidence of coronary calcification.  Normal left main.  The left anterior descending coronary artery wraps around the left ventricular apex.  Luminal irregularities are noted in the mid segment.  No obstructive disease is identified.  The circumflex artery is widely patent and demonstrates no evidence of obstructive atherosclerosis.  The right coronary is dominant and is normal in appearance.  The left ventricular function is normal with EF 60%.  LVEDP is low at 6 mmHg.  Acute angulations/kink in the mid radial artery preventing guidewire passage.  Also eccentric 50% stenosis in the proximal portion of the brachial artery.  RECOMMENDATIONS:   Moderate to femoral access site for bleeding.  Chest discomfort does not appear to be related to myocardial ischemia.  Some features of chest pain and the appearance of EKG suggest the possibility of pericarditis.  We will repeat EKG and check sed rate/CRP.    EKG:  EKG is ordered today.  The ekg ordered today demonstrates normal sinus rhythm 78 bpm, within normal  limits.  Recent Labs: 07/21/2019: Hemoglobin 12.4; Platelet Count 222 11/23/2019: ALT 33 11/30/2019: BUN 15; Creatinine, Ser 0.82; Potassium 4.2; Sodium 136; TSH 0.47  Recent Lipid Panel    Component Value Date/Time   CHOL 133 05/10/2019 1012   TRIG 143.0 05/10/2019 1012   HDL 40.70 05/10/2019 1012   CHOLHDL 3 05/10/2019 1012   VLDL 28.6 05/10/2019 1012   LDLCALC 64 05/10/2019 1012   LDLDIRECT 118.5 08/25/2007 0000    Physical Exam:    VS:  BP 102/64   Pulse 78   Ht 5' 3"  (1.6 m)   Wt 147 lb (66.7 kg)   SpO2 97%   BMI 26.04 kg/m     Wt Readings from Last 3 Encounters:  12/08/19 147 lb (66.7 kg)  11/23/19 150 lb (68 kg)  10/13/19 153 lb (69.4 kg)     GEN:  Well nourished, well developed in no acute distress HEENT: Normal NECK: No JVD; No carotid bruits LYMPHATICS: No lymphadenopathy CARDIAC: RRR, no murmurs, rubs, gallops RESPIRATORY:  Clear to auscultation without rales, wheezing or rhonchi  ABDOMEN: Soft, non-tender, non-distended MUSCULOSKELETAL:  No edema; No deformity  SKIN: Warm and dry NEUROLOGIC:  Alert and oriented x 3 PSYCHIATRIC:  Normal affect   ASSESSMENT:    1. Essential hypertension   2. Mixed hyperlipidemia    PLAN:    In order of problems listed above:  1. Blood pressure is well controlled on hydrochlorothiazide, treated by Dr. Larose Kells. 2. Last lipids with cholesterol 133, HDL 41, LDL 64, treated with atorvastatin.  Overall the patient appears to be doing very well from a cardiac perspective.  She has had no recurrence of chest pain or other symptoms that might be associated with pericarditis.  She requires no further treatment at this time.  I will plan to see her back as needed.   Medication Adjustments/Labs and Tests Ordered: Current medicines are reviewed at length with the patient today.  Concerns regarding medicines are outlined above.  Orders Placed This Encounter  Procedures  . EKG 12-Lead   No orders of the defined types were placed  in this encounter.   Patient Instructions  Medication Instructions:  Your provider recommends that you continue on your current medications as directed. Please refer to the Current Medication list given to you today.   *If you need a refill on your cardiac medications before your next appointment, please call your  pharmacy*  Follow-Up: Please call us if you need anything!    Signed, Sherren Mocha, MD  12/08/2019 11:47 AM    [facility]

## 2019-12-09 ENCOUNTER — Telehealth: Payer: Self-pay | Admitting: Internal Medicine

## 2019-12-09 NOTE — Progress Notes (Signed)
  Chronic Care Management   Note  12/09/2019 Name: Angela Clay MRN: [id number] DOB: [birth year]  Angela Clay is a 86 y.o. year old female who is a primary care patient of Paz, Alda Berthold, MD. I reached out to [name] by phone today in response to a referral sent by Ms. Brytney Sharee Holster PCP, Colon Branch, MD.   Ms. Ilg was given information about Chronic Care Management services today including:  1. CCM service includes personalized support from designated clinical staff supervised by her physician, including individualized plan of care and coordination with other care providers 2. 24/7 contact phone numbers for assistance for urgent and routine care needs. 3. Service will only be billed when office clinical staff spend 20 minutes or more in a month to coordinate care. 4. Only one practitioner may furnish and bill the service in a calendar month. 5. The patient may stop CCM services at any time (effective at the end of the month) by phone call to the office staff. 6. The patient will be responsible for cost sharing (co-pay) of up to 20% of the service fee (after annual deductible is met).  Patient did not agree to services and wishes to consider information provided before deciding about enrollment in care management services.   Follow up plan:   Raynicia Dukes [employer] Scheduler

## 2019-12-09 NOTE — Chronic Care Management (AMB) (Signed)
Chronic Care Management   Note  12/09/2019 Name: Angela Clay MRN: [id number] DOB: Oct 27, 1934  Angela Clay is a 86 y.o. year old female who is a primary care patient of Paz, Alda Berthold, MD. I reached out to [name] by phone today in response to a referral sent by Ms. Eshika Sharee Holster PCP, Colon Branch, MD.   [name] was given information about Chronic Care Management services today including:  1. CCM service includes personalized support from designated clinical staff supervised by her physician, including individualized plan of care and coordination with other care providers 2. 24/7 contact phone numbers for assistance for urgent and routine care needs. 3. Service will only be billed when office clinical staff spend 20 minutes or more in a month to coordinate care. 4. Only one practitioner may furnish and bill the service in a calendar month. 5. The patient may stop CCM services at any time (effective at the end of the month) by phone call to the office staff. 6. The patient will be responsible for cost sharing (co-pay) of up to 20% of the service fee (after annual deductible is met).  Patient agreed to services and verbal consent obtained.   Follow up plan:   Raynicia Dukes [employer] Scheduler

## 2019-12-21 ENCOUNTER — Ambulatory Visit: Payer: PPO | Admitting: Pharmacist

## 2019-12-21 ENCOUNTER — Ambulatory Visit: Payer: PPO | Admitting: Gastroenterology

## 2019-12-21 ENCOUNTER — Other Ambulatory Visit: Payer: Self-pay

## 2019-12-21 DIAGNOSIS — I10 Essential (primary) hypertension: Secondary | ICD-10-CM

## 2019-12-21 DIAGNOSIS — E785 Hyperlipidemia, unspecified: Secondary | ICD-10-CM

## 2019-12-21 DIAGNOSIS — R739 Hyperglycemia, unspecified: Secondary | ICD-10-CM

## 2019-12-21 DIAGNOSIS — E039 Hypothyroidism, unspecified: Secondary | ICD-10-CM

## 2019-12-21 DIAGNOSIS — I779 Disorder of arteries and arterioles, unspecified: Secondary | ICD-10-CM

## 2019-12-21 NOTE — Chronic Care Management (AMB) (Signed)
Chronic Care Management Pharmacy  Name: Angela Clay  MRN: [id number] DOB: January 14, 1934   Chief Complaint/ HPI  Angela Clay,  86 y.o. , female presents for their Initial CCM visit with the clinical pharmacist via telephone due to COVID-19 Pandemic.  PCP : Colon Branch, MD  Their chronic conditions include: HLD, CAD, Gout, Vitamin D Deficiency, Hypertension, Hypothyroidis, Pre-Diabetes, Osteopenia  Office Visits: 11/10/19: Virtual visit w/ Dr. Larose Kells - Labs drawn (Vitamin D, a1c, BMP, TSH)  Consult Visits: 12/08/19: Cardiology office visit w/ Dr. Burt Knack - Pt had hx of chest pain in 2019, ultimately suspected to have acute pericarditis. Treated with colchicine with resolution of symptoms. She underwent cardio cath demonstrating mild nonobstructive CAD. Denies symptoms of recurrent chest pain. Currently undergoing evaluation for elevated LFTs. Pt appearing well from cardiac perspective. Follow up PRN.   11/23/19: Upper EGD  10/13/19: [place] office visit w/ Dr. Rush Landmark - Elevated alk phos. Found positive anti-smooth muscle antibody. No transverse elevation which goes against possibility of underlying autoimmune hepatitis. Will keep this is mind. 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 EUS agreed upon.    07/21/19: Oncology office visit w/ Dr. Maylon Peppers - No further work up recommended  07/05/19: [place] office visit w/ Dr. Rush Landmark - Abnormal LFTs. Liver tests continue to improve. No underlying diagnosis has been found. Echogenic appearance of liver suggestive of fatty liver. If elevated alk phos consider MRI/MRCP. If after 6-12 months of abnormal LFTs then consider liver biopsy. Pt concerned about risks of biopsy. PRN PPI use agreed upon  06/28/19: Oncology appt w/ Dr. Maylon Peppers - Abnormal SPEP. Pt presented to PCP in early 05/2019 for acute swelling and pain of the right wrist and hand, suspicious for acute gout flare. Serum protein level  was mildly elevated and SPEP was checked which showed "1 or more serum protein fractions outside of normal range, but no abnormal protein bands identified. If her SPEP, IFE, and free light chains do not identify any suspicious monoclonal protein, then no further work-up is indicated to assess any monoclonal gammopathy. Asymptomatic hypokalemia. Prescribed KCl 20 mEq and instructed to follow up with PCP for further management. Alk phos improving, AST/ALT normalized, pt to follow up with GI on 07/05/19 encouraged to keep appt.  Medications: Outpatient Encounter Medications as of 12/21/2019  Medication Sig  . acetaminophen (TYLENOL) 500 MG tablet Take 500 mg by mouth every 6 (six) hours as needed for mild pain.   Marland Kitchen aspirin 81 MG tablet Take 81 mg by mouth at bedtime.   Marland Kitchen atorvastatin (LIPITOR) 40 MG tablet Take 1 tablet (40 mg total) by mouth at bedtime.  . Cholecalciferol (VITAMIN D3) 25 MCG (1000 UT) CAPS Take 25 mcg by mouth daily.  . Colchicine (MITIGARE) 0.6 MG CAPS Take 1 capsule by mouth 2 (two) times daily as needed.  . hydrochlorothiazide (HYDRODIURIL) 25 MG tablet Take 1 tablet by mouth once daily  . levothyroxine (SYNTHROID) 112 MCG tablet Take 1 tablet (112 mcg total) by mouth daily before breakfast.  . Multiple Vitamins-Minerals (CENTRUM SILVER 50+WOMEN) TABS Take 1 tablet by mouth daily. Calcium: 353m Vitamin D3: 1000 units  . Polyethyl Glycol-Propyl Glycol (SYSTANE ULTRA OP) Place 1 drop into both eyes daily as needed (Dry eyes).  . Vitamin D, Ergocalciferol, (DRISDOL) 1.25 MG (50000 UNIT) CAPS capsule Take 1 capsule (50,000 Units total) by mouth every 7 (seven) days.  . calcium-vitamin D (OSCAL WITH D) 250-125  MG-UNIT tablet Take 1 tablet by mouth daily.   No facility-administered encounter medications on file as of 12/21/2019.    Current Diagnosis/Assessment:  Goals Addressed            This Visit's Progress   . Blood pressure goal <140/90      . Check blood pressure 2 to 3  times per week      . Complete uric acid lab at next office visit      . Complete Vitamin D lab at next office visit      . Pharmacy Care Plan       Current Barriers:  . Chronic Disease Management support, education, and care coordination needs related to HLD, CAD, Gout, Vitamin D Deficiency, Hypertension, Hypothyroidis, Pre-Diabetes, Osteopenia  Pharmacist Clinical Goal(s):  . Blood pressure goal <140/90 . Minimal function limitation due to gout . Complete uric acid lab at next office visit . Complete Vitamin D lab at next office visit  Interventions: . Comprehensive medication review performed. . Start calcium citrate 655m once daily to supplement calcium found in multivitamin . Start checking blood pressure 2 to 3 times per week . Record how often you have gout attacks  Patient Self Care Activities:  . Patient verbalizes understanding of plan to follow as described above, Self administers medications as prescribed, Calls pharmacy for medication refills, and Calls provider office for new concerns or questions  Initial goal documentation     . Record how often you have gout attacks       -If you are often bothered by gout, we can consider starting allopurinol daily for prevention of gout attacks    . Start taking Calcium Citrate 6047monce daily       This will help you get the appropriate amount of calcium in addition to the calcium provided in your multivitamin      Hyperlipidemia   Lipid Panel     Component Value Date/Time   CHOL 133 05/10/2019 1012   TRIG 143.0 05/10/2019 1012   HDL 40.70 05/10/2019 1012   CHOLHDL 3 05/10/2019 1012   VLDL 28.6 05/10/2019 1012   LDLCALC 64 05/10/2019 1012   LDLDIRECT 118.5 08/25/2007 0000     ASCVD 10-year risk: Hx of ASCVD  Patient has failed these meds in past: None noted Patient is currently controlled on the following medications: atorvastatin 4023maily  Plan -Continue current medications   CAD   Patient has failed  these meds in past: None noted Patient is currently controlled on the following medications: aspirin 54m28mily, atorvastatin 40mg58mly  Plan -Continue current medications   Gout   06/01/2019: Uric Acid = 8.1  Patient has failed these meds in past: None noted Patient is currently controlled despite most recent uric acid level on the following medications: colchicine 0.6mg t41me daily as needed  "I don't have gout a lot" Usually resolves with 2-3 capsules Last gout attack 2 weeks ago  We discussed:  possiblity of allopurinol if gout frequency increases  Plan -Get uric acid lab completed at next office visit -Continue current medications   Vitamin D Deficiency    11/30/19: Vitamin D = 26.99  Patient has failed these meds in past: None noted Patient is currently uncontrolled but vitamin D therapy resumed on the following medications: Vitamin D2 1.25mg Q33mys on Friday, Vitamin D3 1000 units daily, multivitamin with D3 1000 units   We discussed: Supplementation versus oversupplementation  Plan -Continue current medications  -Repeat vitamin D level  in 3 months or at next office visit  Hypertension   BMP Latest Ref Rng & Units 11/30/2019 07/21/2019 06/28/2019  Glucose 70 - 99 mg/dL 97 224(H) 203(H)  BUN 6 - 23 mg/dL 15 12 10   Creatinine 0.40 - 1.20 mg/dL 0.82 0.92 1.03(H)  Sodium 135 - 145 mEq/L 136 135 135  Potassium 3.5 - 5.1 mEq/L 4.2 4.1 3.2(L)  Chloride 96 - 112 mEq/L 98 98 96(L)  CO2 19 - 32 mEq/L 31 29 29   Calcium 8.4 - 10.5 mg/dL 9.8 10.0 9.8     BP today is: Unable to assess due to phone visit  Office blood pressures are  BP Readings from Last 3 Encounters:  12/08/19 102/64  11/23/19 (!) 153/60  11/10/19 119/87    Patient has failed these meds in the past: None noted  Patient is currently controlled on the following medications: hctz 78m daily AM  Patient checks BP at home infrequently  We discussed [place] BP measurement technique  Plan -Check blood  pressure 2 to 3 times per week -Continue current medications   Hypothyroidism   11/30/19: TSH = 0.47  Patient has failed these meds in past: None Patient is currently controlled on the following medications: levothyroxine 1111m daily  Plan -Continue current medications   Pre-Diabetes   Recent Relevant Labs: Lab Results  Component Value Date/Time   HGBA1C 6.0 11/30/2019 10:24 AM   HGBA1C 6.8 (H) 05/10/2019 10:12 AM     Patient is currently controlled on the following medications: None  Pt reports she has lost almost 20lbs in past year by cutting back on food. She was told that losing weight could be less troublesome for her liver. There is a possibility of liver biopsy.   12/29/19 seeing Dr. MaVincenza HewsWe discussed: diet and exercise extensively  Plan -Continue control with diet and exercise   Osteopenia   05/14/17: DEXA = Osteopenia T-Scores Femur Neck L: -1.8  Patient has failed these meds in past: None noted Patient is currently stable, although not on optimal dose of calcium on the following medications: multivitamin with 30056mf calcium   We discussed:  Patient needing total of 1200m64m calcium and 800 units of vitamin D through supplementation and diet for osteopenia   Plan -Start calcium citrate 600mg71me daily OTC

## 2019-12-23 NOTE — Patient Instructions (Addendum)
Visit Information  Goals Addressed            This Visit's Progress   . Blood pressure goal <140/90      . Check blood pressure 2 to 3 times per week      . Complete uric acid lab at next office visit      . Complete Vitamin D lab at next office visit      . Pharmacy Care Plan       Current Barriers:  . Chronic Disease Management support, education, and care coordination needs related to HLD, CAD, Gout, Vitamin D Deficiency, Hypertension, Hypothyroidis, Pre-Diabetes, Osteopenia  Pharmacist Clinical Goal(s):  . Blood pressure goal <140/90 . Minimal function limitation due to gout . Complete uric acid lab at next office visit . Complete Vitamin D lab at next office visit  Interventions: . Comprehensive medication review performed. . Start calcium citrate 653m once daily to supplement calcium found in multivitamin . Start checking blood pressure 2 to 3 times per week . Record how often you have gout attacks  Patient Self Care Activities:  . Patient verbalizes understanding of plan to follow as described above, Self administers medications as prescribed, Calls pharmacy for medication refills, and Calls provider office for new concerns or questions  Initial goal documentation     . Record how often you have gout attacks       -If you are often bothered by gout, we can consider starting allopurinol daily for prevention of gout attacks    . Start taking Calcium Citrate 6054monce daily       This will help you get the appropriate amount of calcium in addition to the calcium provided in your multivitamin       Angela Clay given information about Chronic Care Management services today including:  1. CCM service includes personalized support from designated clinical staff supervised by her physician, including individualized plan of care and coordination with other care providers 2. 24/7 contact phone numbers for assistance for urgent and routine care needs. 3. Service will  only be billed when office clinical staff spend 20 minutes or more in a month to coordinate care. 4. Only one practitioner may furnish and bill the service in a calendar month. 5. The patient may stop CCM services at any time (effective at the end of the month) by phone call to the office staff. 6. The patient will be responsible for cost sharing (co-pay) of up to 20% of the service fee (after annual deductible is met).  Patient agreed to services and verbal consent obtained.   The patient verbalized understanding of instructions provided today and agreed to receive a mailed copy of patient instruction and/or educational materials. The pharmacy team will reach out to the patient again over the next 30 days.   Angela BlanchPharmD Clinical Pharmacist [phone]  Gout is painful swelling of your joints. Gout is a type of arthritis. It is caused by having too much uric acid in your body. Uric acid is a chemical that is made when your body breaks down substances called purines. If your body has too much uric acid, sharp crystals can form and build up in your joints. This causes pain and swelling. Gout attacks can happen quickly and be very painful (acute gout). Over time, the attacks can affect more joints and happen more often (chronic gout). What are the causes?  Too much uric acid in your  blood. This can happen because: ? Your kidneys do not remove enough uric acid from your blood. ? Your body makes too much uric acid. ? You eat too many foods that are high in purines. These foods include organ meats, some seafood, and beer.  Trauma or stress. What increases the risk?  Having a family history of gout.  Being female and middle-aged.  Being female and having gone through menopause.  Being very overweight (obese).  Drinking alcohol, especially beer.  Not having enough water in the body (being dehydrated).  Losing weight too  quickly.  Having an organ transplant.  Having lead poisoning.  Taking certain medicines.  Having kidney disease.  Having a skin condition called psoriasis. What are the signs or symptoms? An attack of acute gout usually happens in just one joint. The most common place is the big toe. Attacks often start at night. Other joints that may be affected include joints of the feet, ankle, knee, fingers, wrist, or elbow. Symptoms of an attack may include:  Very bad pain.  Warmth.  Swelling.  Stiffness.  Shiny, red, or purple skin.  Tenderness. The affected joint may be very painful to touch.  Chills and fever. Chronic gout may cause symptoms more often. More joints may be involved. You may also have white or yellow lumps (tophi) on your hands or feet or in other areas near your joints. How is this treated?  Treatment for this condition has two phases: treating an acute attack and preventing future attacks.  Acute gout treatment may include: ? NSAIDs. ? Steroids. These are taken by mouth or injected into a joint. ? Colchicine. This medicine relieves pain and swelling. It can be given by mouth or through an IV tube.  Preventive treatment may include: ? Taking small doses of NSAIDs or colchicine daily. ? Using a medicine that reduces uric acid levels in your blood. ? Making changes to your diet. You may need to see a food expert (dietitian) about what to eat and drink to prevent gout. Follow these instructions at home: During a gout attack   If told, put ice on the painful area: ? Put ice in a plastic bag. ? Place a towel between your skin and the bag. ? Leave the ice on for 20 minutes, 2-3 times a Angela Clay.  Raise (elevate) the painful joint above the level of your heart as often as you can.  Rest the joint as much as possible. If the joint is in your leg, you may be given crutches.  Follow instructions from your doctor about what you cannot eat or drink. Avoiding future gout  attacks  Eat a low-purine diet. Avoid foods and drinks such as: ? Liver. ? Kidney. ? Anchovies. ? Asparagus. ? Herring. ? Mushrooms. ? Mussels. ? Beer.  Stay at a healthy weight. If you want to lose weight, talk with your doctor. Do not lose weight too fast.  Start or continue an exercise plan as told by your doctor. Eating and drinking  Drink enough fluids to keep your pee (urine) pale yellow.  If you drink alcohol: ? Limit how much you use to:  0-1 drink a Angela Clay for women.  0-2 drinks a Angela Clay for men. ? Be aware of how much alcohol is in your drink. In the [place] one drink equals one 12 oz bottle of beer (355 mL), one 5 oz glass of wine (148 mL), or one 1 oz glass of hard liquor (44 mL). General instructions  Take  over-the-counter and prescription medicines only as told by your doctor.  Do not drive or use heavy machinery while taking prescription pain medicine.  Return to your normal activities as told by your doctor. Ask your doctor what activities are safe for you.  Keep all follow-up visits as told by your doctor. This is important. Contact a doctor if:  You have another gout attack.  You still have symptoms of a gout attack after 10 days of treatment.  You have problems (side effects) because of your medicines.  You have chills or a fever.  You have burning pain when you pee (urinate).  You have pain in your lower back or belly. Get help right away if:  You have very bad pain.  Your pain cannot be controlled.  You cannot pee. Summary  Gout is painful swelling of the joints.  The most common site of pain is the big toe, but it can affect other joints.  Medicines and avoiding some foods can help to prevent and treat gout attacks. This information is not intended to replace advice given to you by your health care provider. Make sure you discuss any questions you have with your health care provider. Document Revised: 05/05/2018 Document Reviewed:  05/05/2018 Elsevier Patient Education  [place].

## 2019-12-28 ENCOUNTER — Telehealth: Payer: Self-pay

## 2019-12-28 NOTE — Telephone Encounter (Signed)
Patient called in to file a complaint about the CCM service  With [facility]. Please follow up with the patient at [phone].  Thanks,

## 2019-12-29 ENCOUNTER — Other Ambulatory Visit: Payer: Self-pay

## 2019-12-29 ENCOUNTER — Other Ambulatory Visit (INDEPENDENT_AMBULATORY_CARE_PROVIDER_SITE_OTHER): Payer: PPO

## 2019-12-29 ENCOUNTER — Ambulatory Visit (INDEPENDENT_AMBULATORY_CARE_PROVIDER_SITE_OTHER): Payer: PPO | Admitting: Gastroenterology

## 2019-12-29 ENCOUNTER — Encounter: Payer: Self-pay | Admitting: Gastroenterology

## 2019-12-29 VITALS — BP 120/70 | HR 76 | Temp 97.5°F | Ht 63.0 in | Wt 149.8 lb

## 2019-12-29 DIAGNOSIS — R899 Unspecified abnormal finding in specimens from other organs, systems and tissues: Secondary | ICD-10-CM

## 2019-12-29 DIAGNOSIS — R945 Abnormal results of liver function studies: Secondary | ICD-10-CM | POA: Diagnosis not present

## 2019-12-29 DIAGNOSIS — K862 Cyst of pancreas: Secondary | ICD-10-CM | POA: Diagnosis not present

## 2019-12-29 DIAGNOSIS — R7989 Other specified abnormal findings of blood chemistry: Secondary | ICD-10-CM

## 2019-12-29 DIAGNOSIS — B3781 Candidal esophagitis: Secondary | ICD-10-CM

## 2019-12-29 DIAGNOSIS — R760 Raised antibody titer: Secondary | ICD-10-CM

## 2019-12-29 DIAGNOSIS — K2950 Unspecified chronic gastritis without bleeding: Secondary | ICD-10-CM

## 2019-12-29 LAB — BASIC METABOLIC PANEL
BUN: 16 mg/dL (ref 6–23)
CO2: 32 mEq/L (ref 19–32)
Calcium: 10.7 mg/dL — ABNORMAL HIGH (ref 8.4–10.5)
Chloride: 97 mEq/L (ref 96–112)
Creatinine, Ser: 0.85 mg/dL (ref 0.40–1.20)
GFR: 63.7 mL/min (ref >60.00)
Glucose, Bld: 83 mg/dL (ref 70–99)
Potassium: 3.5 mEq/L (ref 3.5–5.1)
Sodium: 135 mEq/L (ref 135–145)

## 2019-12-29 LAB — CBC
HCT: 39.5 % (ref 36.0–46.0)
Hemoglobin: 13.1 g/dL (ref 12.0–15.0)
MCHC: 33.3 g/dL (ref 30.0–36.0)
MCV: 84.5 fl (ref 78.0–100.0)
Platelets: 239 10*3/uL (ref 150.0–400.0)
RBC: 4.67 Mil/uL (ref 3.87–5.11)
RDW: 16.5 % — ABNORMAL HIGH (ref 11.5–15.5)
WBC: 7.2 10*3/uL (ref 4.0–10.5)

## 2019-12-29 LAB — HEPATIC FUNCTION PANEL
ALT: 24 U/L (ref 0–35)
AST: 27 U/L (ref 0–37)
Albumin: 4.1 g/dL (ref 3.5–5.2)
Alkaline Phosphatase: 160 U/L — ABNORMAL HIGH (ref 39–117)
Bilirubin, Direct: 0.2 mg/dL (ref 0.0–0.3)
Total Bilirubin: 0.8 mg/dL (ref 0.2–1.2)
Total Protein: 8.9 g/dL — ABNORMAL HIGH (ref 6.0–8.3)

## 2019-12-29 LAB — PROTIME-INR
INR: 1.1 ratio — ABNORMAL HIGH (ref 0.8–1.0)
Prothrombin Time: 12.4 s (ref 9.6–13.1)

## 2019-12-29 MED ORDER — LEVOTHYROXINE SODIUM 112 MCG PO TABS: 112.0000 ug | ORAL_TABLET | Freq: Every day | ORAL | 1 refills | Status: DC

## 2019-12-29 NOTE — Patient Instructions (Addendum)
Your provider has requested that you go to the basement level for lab work before leaving today. Press "B" on the elevator. The lab is located at the first door on the left as you exit the elevator.  Due to recent changes in healthcare laws, you may see the results of your imaging and laboratory studies on MyChart before your provider has had a chance to review them.  We understand that in some cases there may be results that are confusing or concerning to you. Not all laboratory results come back in the same time frame and the provider may be waiting for multiple results in order to interpret others.  Please give [place] 48 hours in order for your provider to thoroughly review all the results before contacting the office for clarification of your results.       Order has been placed for Liver Biopsy with IR. They will contact you to schedule. If you have not heard from them within 1 week please contact me [phone].   Thank you for choosing me and [facility].  Dr. Rush Landmark

## 2019-12-30 NOTE — Telephone Encounter (Signed)
Called patient to discuss her concerns around her visit. While she was very complementary of the assistance that Melvenia Beam provided them both with their medication, she was largely off-put by what she felt was a "sales pitch" at the end of the visit. Ultimately, she did not understand why she would need her medications packaged for her. Feels her and her husband are still able to obtain their medication's as well as take them compliantly. She's choosing to decline further visits with the pharmacist at this time. States should she or her husband need the services in the future, they will let Dr. Larose Kells know to reinstate the pharmacy services for them.

## 2019-12-31 ENCOUNTER — Encounter: Payer: Self-pay | Admitting: Gastroenterology

## 2019-12-31 DIAGNOSIS — K2950 Unspecified chronic gastritis without bleeding: Secondary | ICD-10-CM | POA: Insufficient documentation

## 2019-12-31 DIAGNOSIS — B3781 Candidal esophagitis: Secondary | ICD-10-CM | POA: Insufficient documentation

## 2019-12-31 NOTE — Progress Notes (Signed)
[facility] VISIT   Primary Care Provider Colon Branch, MD [address] [place] [address] [phone]  Patient Profile: Angela Clay is a 86 y.o. 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  The patient presents to the [facility] for an evaluation and management of problem(s) noted below:  Problem List 1. Abnormal LFTs   2. Positive antismooth muscle antibody    3. Pancreatic cyst   4. Candida esophagitis ([place])   5. Chronic gastritis without bleeding, unspecified gastritis type     History of Present Illness Please see initial consultation note and progress notes for full details of HPI.  Interval History Today, the patient returns for scheduled follow-up.  We performed a recent EGD/EUS with findings of gastritis as well as candidal esophagitis.  On EUS the patient was found to have a pancreatic cyst most likely consistent with an IPMN branch duct without any clear or concerning findings on EUS in size.  This was not sampled.  We were going to attempt a liver biopsy but the appropriate size liver biopsy needle was not present and so that was deferred.  Patient had repeat liver tests as noted in epic.  Patient is feeling well at this time.  She enjoyed her vacation to [place].  The patient denies any issues with jaundice, scleral icterus, pruritus, darkened/amber urine, clay-colored stools, LE edema, hematemesis, coffee-ground emesis, abdominal distention, confusion, new generalized pruritus.  Her swallowing is fine.  She denies any odynophagia or dysphagia.  No significant abdominal pain.  GI Review of Systems Positive as above Negative for vomiting, nausea, change in bowel habits, melena, hematochezia    Review of Systems General:  Denies fevers/chills/weight loss Cardiovascular: Denies chest pain/palpitations Pulmonary: Denies shortness of breath Gastroenterological: See HPI Genitourinary: Denies darkened urine Hematological: Denies easy bruising/bleeding Dermatological: Denies jaundice Psychological: Mood is stable   Medications Current Outpatient Medications  Medication Sig Dispense Refill  . acetaminophen (TYLENOL) 500 MG tablet Take 500 mg by mouth every 6 (six) hours as needed for mild pain.     Marland Kitchen aspirin 81 MG tablet Take 81 mg by mouth at bedtime.     Marland Kitchen atorvastatin (LIPITOR) 40 MG tablet Take 1 tablet (40 mg total) by mouth at bedtime. 90 tablet 3  . calcium-vitamin D (OSCAL WITH D) 250-125 MG-UNIT tablet Take 1 tablet by mouth daily.    . Cholecalciferol (VITAMIN D3) 25 MCG (1000 UT) CAPS Take 25 mcg by mouth daily.    . Colchicine (MITIGARE) 0.6 MG CAPS Take 1 capsule by mouth 2 (two) times daily as needed. 60 capsule 0  . hydrochlorothiazide (HYDRODIURIL) 25 MG tablet Take 1 tablet by mouth once daily 90 tablet 0  . levothyroxine (SYNTHROID) 112 MCG tablet Take 1 tablet (112 mcg total) by mouth daily before breakfast. 90 tablet 1  . Multiple Vitamins-Minerals (CENTRUM SILVER 50+WOMEN) TABS Take 1 tablet by mouth daily. Calcium: 322m Vitamin D3: 1000 units    . omeprazole (PRILOSEC) 20 MG capsule Take 20 mg by mouth daily as needed.    .Vladimir FasterGlycol-Propyl Glycol (SYSTANE ULTRA OP) Place 1 drop into both eyes daily as needed (Dry eyes).    . Vitamin D, Ergocalciferol, (DRISDOL) 1.25 MG (50000 UNIT) CAPS capsule Take 1 capsule (50,000 Units total) by mouth every 7 (seven) days. 12 capsule 0   No current facility-administered  medications for this visit.    Allergies Allergies  Allergen Reactions  . Codeine Nausea And Vomiting    Histories Past Medical History:  Diagnosis Date  . Actinic keratosis 05/2015   Right mid central dorsal hand  . Gallstones   . GERD (gastroesophageal reflux  disease)    EGD for dysphagia 2000 aprox (-)  . History of radiation therapy   . Hyperlipidemia   . Hypertension   . Hypothyroidism   . Lymphoma ([place])    neck,mlig,unspecified site dx 1970s  . OA (osteoarthritis)    severe at neck  . Osteopenia   . Syncope 11-2012   admited . w/u (-)  . Thyroid cancer [facility])    Past Surgical History:  Procedure Laterality Date  . APPENDECTOMY    . BELPHAROPTOSIS REPAIR Bilateral 2017  . BIOPSY  11/23/2019   Procedure: BIOPSY;  Surgeon: Rush Landmark Telford Nab., MD;  Location: [facility];  Service: Gastroenterology;;  . CATARACT EXTRACTION, BILATERAL Bilateral 01/10/2019   Dr.Rankin  . CHOLECYSTECTOMY  02/24/06  . ESOPHAGOGASTRODUODENOSCOPY (EGD) WITH PROPOFOL N/A 11/23/2019   Procedure: ESOPHAGOGASTRODUODENOSCOPY (EGD) WITH PROPOFOL;  Surgeon: Rush Landmark Telford Nab., MD;  Location: [facility];  Service: Gastroenterology;  Laterality: N/A;  . EXPLORATORY LAPAROTOMY     for lymphadenomopathy; appendix removed at this time  . LEFT HEART CATH AND CORONARY ANGIOGRAPHY N/A 07/20/2018   Procedure: LEFT HEART CATH AND CORONARY ANGIOGRAPHY;  Surgeon: Belva Crome, MD;  Location: [facility];  Service: Cardiovascular;  Laterality: N/A;  . THYROIDECTOMY    . TONSILLECTOMY AND ADENOIDECTOMY  1949  . TUBAL LIGATION  1972  . UPPER ESOPHAGEAL ENDOSCOPIC ULTRASOUND (EUS) N/A 11/23/2019   Procedure: UPPER ESOPHAGEAL ENDOSCOPIC ULTRASOUND (EUS);  Surgeon: Irving Copas., MD;  Location: [facility];  Service: Gastroenterology;  Laterality: N/A;  WITH BIOPSY   NEEDS 90 MINUTES  . vitamacular hole surgery Right 2020   eye   Social History   Socioeconomic History  . Marital status: Married    Spouse name: Iona Beard  . Number of children: 3  . Years of education: Not on file  . Highest education level: Not on file  Occupational History  . Occupation: retired  Tobacco Use  . Smoking status: Never Smoker  . Smokeless tobacco: Never Used    Substance and Sexual Activity  . Alcohol use: No    Alcohol/week: 0.0 standard drinks  . Drug use: No  . Sexual activity: Not Currently    Partners: Male    Birth control/protection: Post-menopausal  Other Topics Concern  . Not on file  Social History Narrative   3 kids, 8 GK   Lives w/ husband   Lost a g-child to suicide 2017      Social Determinants of Health   Financial Resource Strain:   . Difficulty of Paying Living Expenses: Not on file  Food Insecurity:   . Worried About Charity fundraiser in the Last Year: Not on file  . Ran Out of Food in the Last Year: Not on file  Transportation Needs:   . Lack of Transportation (Medical): Not on file  . Lack of Transportation (Non-Medical): Not on file  Physical Activity:   . Days of Exercise per Week: Not on file  . Minutes of Exercise per Session: Not on file  Stress:   . Feeling of Stress : Not on file  Social Connections:   . Frequency of Communication with Friends and Family: Not on file  . Frequency of  Social Gatherings with Friends and Family: Not on file  . Attends Religious Services: Not on file  . Active Member of Clubs or Organizations: Not on file  . Attends Archivist Meetings: Not on file  . Marital Status: Not on file  Intimate Partner Violence:   . Fear of Current or Ex-Partner: Not on file  . Emotionally Abused: Not on file  . Physically Abused: Not on file  . Sexually Abused: Not on file   Family History  Problem Relation Age of Onset  . Stroke Mother 76  . Coronary artery disease Father        MI age 40  . Deep vein thrombosis Brother        PE at age 89  . Uterine cancer Sister   . Heart failure Sister   . Diabetes Neg Hx   . Colon cancer Neg Hx   . Breast cancer Neg Hx   . Inflammatory bowel disease Neg Hx   . Liver disease Neg Hx   . Pancreatic cancer Neg Hx   . Rectal cancer Neg Hx   . Stomach cancer Neg Hx    I have reviewed her medical, social, and family history in detail  and updated the electronic medical record as necessary.    PHYSICAL EXAMINATION  BP 120/70   Pulse 76   Temp (!) 97.5 F (36.4 C)   Ht 5' 3"  (1.6 m)   Wt 149 lb 12.8 oz (67.9 kg)   BMI 26.54 kg/m  Wt Readings from Last 3 Encounters:  12/29/19 149 lb 12.8 oz (67.9 kg)  12/08/19 147 lb (66.7 kg)  11/23/19 150 lb (68 kg)  GEN: NAD, appears stated age, doesn't appear chronically ill PSYCH: Cooperative, without pressured speech EYE: Conjunctivae pink, sclerae anicteric ENT: MMM CV: RR without R/Gs  RESP: CTAB posteriorly, without wheezing GI: NABS, soft, NT/ND, without rebound or guarding, no HSM appreciated MSK/EXT: No lower extremity edema SKIN: No jaundice, no spider angiomata NEURO:  Alert & Oriented x 3, no focal deficits   REVIEW OF DATA  I reviewed the following data at the time of this encounter:  GI Procedures and Studies  January 2021 EGD/EUS EGD Impression: - No gross lesions in esophagus proximally. White nummular lesions in esophageal mucosa in mid-esophagus - biopsied. No gross lesions in esophagus distally. Z-line regular, 38 cm from the incisors. - Multiple gastric polyps - likely fundic gland. Biopsied. Erythematous mucosa in the gastric body. No other gross lesions in the stomach. Biopsied for HP. - A single duodenal polyp. Resected and retrieved. No other gross mucosal lesions in the duodenal bulb, in the first portion of the duodenum and in the second portion of the duodenum. Biopsied for Celiac. - Normal major papilla under a hood. EUS Impression: - A cystic lesion was seen in the pancreatic body. Tissue has not been obtained. However, the endosonographic appearance is consistent with a branched intraductal papillary mucinous neoplasm in similar nature to prior MRICP imaging. - There was no other sign of significant pathology in the pancreatic head, genu of the pancreas, pancreatic body and pancreatic tail. - There was a heterogenous echotexture in the  visualized portion of the liver. No masses or lesions. - No evidence of choledocholithiasis or ampullary lesion. - No malignant-appearing lymph nodes were visualized in the celiac region (level 20), perigastric region, peripancreatic region and porta hepatis region.  Laboratory Studies  Reviewed those in epic  Imaging Studies  November 2020 MRI/MRCP IMPRESSION: Mild hepatic  steatosis. No evidence of hepatic neoplasm. Prior cholecystectomy. No evidence of biliary obstruction or choledocholithiasis. 10 mm cystic lesion in pancreatic body with probable communication with the main pancreatic duct. This likely represents an indolent cystic neoplasm such as a side-branch IPMN. No further imaging follow-up is suggested given patient age. This recommendation follows ACR consensus guidelines: Management of Incidental Pancreatic Cysts: A White Paper of the ACR Incidental Findings Committee. [place] [id number].   ASSESSMENT  Ms. Massar is a 86 y.o. 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  The patient is seen today for evaluation and management of:  1. Abnormal LFTs   2. Positive antismooth muscle antibody    3. Pancreatic cyst   4. Candida esophagitis ([place])   5. Chronic gastritis without bleeding, unspecified gastritis type    The patient is clinically and hemodynamically stable.  She continues to have an elevation in her liver biochemical tests and recent labs within the last month and a half showed elevation in bilirubin, very slightly.  I would like to recheck liver tests.  At this point the patient has had a greater then 55-monthperiod of abnormal liver tests and a liver biopsy is indicated.  Her positive anti-smooth muscle antibody is interesting though without significant transferase  elevation autoimmune hepatitis seems very unlikely.  We ruled out functional or structural biliary obstruction at this point in time.  Liver biopsy will help guide next steps as her alkaline phosphatase isoenzymes are within normal limits for both liver and bone.  We will see what we find on percutaneous liver biopsy.  We discussed the risks associated with a liver biopsy will be gone over by the interventional radiology colleagues as well (bleeding, capsular hematoma, bile peritonitis, infection, medication complication).  Patient agrees to move forward with percutaneous biopsy for neck steps in evaluation.  The patient also has a pancreas cyst which on imaging and ultrasound imaging is most consistent with a branch duct IPMN.  No concerning findings were found on pancreatic cyst requiring endoscopic management or aspiration.  We will plan a 620-monthollow-up MRI from EUS timing in regards to follow-up of this lesion.  If at 6-62-monthI/MRCP things are stable then I would plan a 1 year follow-up MRI/MRCP and plan to follow this for 5 years at least to ensure that there are no significant changes.  We discussed that branch duct IPMN's have a low approximately 1% lifetime risk of developing pancreatic cancer is overall low risk but nonzero risk.  The risks and benefits of endoscopic evaluation were discussed with the patient; these include but are not limited to the risk of perforation, infection, bleeding, missed lesions, lack of diagnosis, severe illness requiring hospitalization, as well as anesthesia and sedation related illnesses.  Patient was treated for Candida esophagitis and seems to be doing well.  She remains on PPI at this time.  All patient questions were answered, to the best of my ability, and the patient agrees to the aforementioned plan of action with follow-up as indicated.   PLAN  Laboratories as outlined below Percutaneous liver biopsy to be performed Continue PPI MRI/MRCP in 6 months from  EUS (July 2021) to follow pancreatic cyst Continue PPI at current dosing Query colon cancer screening in 2024 if health is excellent but she may not require further screening based on age at that time Follow-up after liver biopsy results have  been reviewed   Orders Placed This Encounter  Procedures  . US BIOPSY (LIVER)  . CBC  . Basic Metabolic Panel (BMET)  . Hepatic function panel  . INR/PT    New Prescriptions   No medications on file   Modified Medications   No medications on file    Planned Follow Up No follow-ups on file.  Total Time in Face-to-Face and in Coordination of Care for patient including independent/personal interpretation/review of prior testing, medical history, examination, medication adjustment, communicating results with the patient directly, and documentation with the EHR is 25 minutes.   Justice Britain, MD [facility] Office # [phone]

## 2020-01-04 ENCOUNTER — Telehealth: Payer: Self-pay | Admitting: Gastroenterology

## 2020-01-04 NOTE — Telephone Encounter (Signed)
Patient is calling state she has [place] BX scheduled for 01/10/20 and has dental appt on 01/09/20. Wants to know if there will be any conflict with appts

## 2020-01-04 NOTE — Telephone Encounter (Signed)
The pt has been advised that she can keep both appts as planned. The pt has been advised of the information and verbalized understanding.

## 2020-01-09 ENCOUNTER — Other Ambulatory Visit: Payer: Self-pay | Admitting: Radiology

## 2020-01-09 ENCOUNTER — Other Ambulatory Visit: Payer: Self-pay | Admitting: Student

## 2020-01-10 ENCOUNTER — Other Ambulatory Visit: Payer: Self-pay

## 2020-01-10 ENCOUNTER — Encounter (HOSPITAL_COMMUNITY): Payer: Self-pay

## 2020-01-10 ENCOUNTER — Ambulatory Visit (HOSPITAL_COMMUNITY)
Admission: RE | Admit: 2020-01-10 | Discharge: 2020-01-10 | Disposition: A | Discharge status: Home / Self Care | Payer: PPO | Source: Ambulatory Visit | Attending: Gastroenterology | Admitting: Gastroenterology

## 2020-01-10 ENCOUNTER — Other Ambulatory Visit: Payer: Self-pay | Admitting: Physician Assistant

## 2020-01-10 DIAGNOSIS — Z7982 Long term (current) use of aspirin: Secondary | ICD-10-CM | POA: Diagnosis not present

## 2020-01-10 DIAGNOSIS — E785 Hyperlipidemia, unspecified: Secondary | ICD-10-CM | POA: Insufficient documentation

## 2020-01-10 DIAGNOSIS — M8580 Other specified disorders of bone density and structure, unspecified site: Secondary | ICD-10-CM | POA: Insufficient documentation

## 2020-01-10 DIAGNOSIS — M1909 Primary osteoarthritis, other specified site: Secondary | ICD-10-CM | POA: Insufficient documentation

## 2020-01-10 DIAGNOSIS — K219 Gastro-esophageal reflux disease without esophagitis: Secondary | ICD-10-CM | POA: Insufficient documentation

## 2020-01-10 DIAGNOSIS — Z79899 Other long term (current) drug therapy: Secondary | ICD-10-CM | POA: Diagnosis not present

## 2020-01-10 DIAGNOSIS — E039 Hypothyroidism, unspecified: Secondary | ICD-10-CM | POA: Diagnosis not present

## 2020-01-10 DIAGNOSIS — K753 Granulomatous hepatitis, not elsewhere classified: Secondary | ICD-10-CM | POA: Diagnosis not present

## 2020-01-10 DIAGNOSIS — R945 Abnormal results of liver function studies: Secondary | ICD-10-CM

## 2020-01-10 DIAGNOSIS — R7989 Other specified abnormal findings of blood chemistry: Secondary | ICD-10-CM | POA: Diagnosis not present

## 2020-01-10 DIAGNOSIS — I10 Essential (primary) hypertension: Secondary | ICD-10-CM | POA: Diagnosis not present

## 2020-01-10 DIAGNOSIS — K7581 Nonalcoholic steatohepatitis (NASH): Secondary | ICD-10-CM | POA: Insufficient documentation

## 2020-01-10 DIAGNOSIS — K7400 Hepatic fibrosis, unspecified: Secondary | ICD-10-CM | POA: Diagnosis not present

## 2020-01-10 LAB — CBC
HCT: 40.2 % (ref 36.0–46.0)
Hemoglobin: 13 g/dL (ref 12.0–15.0)
MCH: 28.4 pg (ref 26.0–34.0)
MCHC: 32.3 g/dL (ref 30.0–36.0)
MCV: 87.8 fL (ref 80.0–100.0)
Platelets: 173 10*3/uL (ref 150–400)
RBC: 4.58 MIL/uL (ref 3.87–5.11)
RDW: 15.9 % — ABNORMAL HIGH (ref 11.5–15.5)
WBC: 6.4 10*3/uL (ref 4.0–10.5)
nRBC: 0 % (ref 0.0–0.2)

## 2020-01-10 LAB — PROTIME-INR
INR: 1 (ref 0.8–1.2)
Prothrombin Time: 12.6 seconds (ref 11.4–15.2)

## 2020-01-10 MED ORDER — SODIUM CHLORIDE 0.9 % IV SOLN
INTRAVENOUS | Status: AC | PRN
  Administered 2020-01-10: 10 mL/h via INTRAVENOUS

## 2020-01-10 MED ORDER — GELATIN ABSORBABLE 12-7 MM EX MISC
CUTANEOUS | Status: AC
  Filled 2020-01-10: qty 1

## 2020-01-10 MED ORDER — MIDAZOLAM HCL 2 MG/2ML IJ SOLN
INTRAMUSCULAR | Status: AC
  Filled 2020-01-10: qty 2

## 2020-01-10 MED ORDER — FENTANYL CITRATE (PF) 100 MCG/2ML IJ SOLN
INTRAMUSCULAR | Status: AC | PRN
  Administered 2020-01-10: 25 ug via INTRAVENOUS

## 2020-01-10 MED ORDER — MIDAZOLAM HCL 2 MG/2ML IJ SOLN
INTRAMUSCULAR | Status: AC | PRN
  Administered 2020-01-10 (×2): 0.5 mg via INTRAVENOUS

## 2020-01-10 MED ORDER — SODIUM CHLORIDE 0.9 % IV SOLN: INTRAVENOUS | Status: DC

## 2020-01-10 MED ORDER — LIDOCAINE-EPINEPHRINE 1 %-1:100000 IJ SOLN
INTRAMUSCULAR | Status: AC
  Filled 2020-01-10: qty 1

## 2020-01-10 MED ORDER — FENTANYL CITRATE (PF) 100 MCG/2ML IJ SOLN
INTRAMUSCULAR | Status: AC
  Filled 2020-01-10: qty 2

## 2020-01-10 NOTE — Discharge Instructions (Addendum)
Liver Biopsy, Care After These instructions give you information about how to care for yourself after your procedure. Your health care provider may also give you more specific instructions. If you have problems or questions, contact your health care provider. What can I expect after the procedure? After your procedure, it is common to have:  Pain and soreness in the area where the biopsy was done.  Bruising around the area where the biopsy was done.  Sleepiness and fatigue for 1-2 days. Follow these instructions at home: Medicines  Take over-the-counter and prescription medicines only as told by your health care provider.  If you were prescribed an antibiotic medicine, take it as told by your health care provider. Do not stop taking the antibiotic even if you start to feel better.  Do not take medicines such as aspirin and ibuprofen unless your health care provider tells you to take them. These medicines thin your blood and can increase the risk of bleeding.  If you are taking prescription pain medicine, take actions to prevent or treat constipation. Your health care provider may recommend that you: ? Drink enough fluid to keep your urine pale yellow. ? Eat foods that are high in fiber, such as fresh fruits and vegetables, whole grains, and beans. ? Limit foods that are high in fat and processed sugars, such as fried or sweet foods. ? Take an over-the-counter or prescription medicine for constipation. Incision care  Follow instructions from your health care provider about how to take care of your incision. Make sure you: ? Wash your hands with soap and water before you change your bandage (dressing). If soap and water are not available, use hand sanitizer. ? Change your dressing as told by your health care provider. ? Leave stitches (sutures), skin glue, or adhesive strips in place. These skin closures may need to stay in place for 2 weeks or longer. If adhesive strip edges start to  loosen and curl up, you may trim the loose edges. Do not remove adhesive strips completely unless your health care provider tells you to do that.  Check your incision area every day for signs of infection. Check for: ? Redness, swelling, or pain. ? Fluid or blood. ? Warmth. ? Pus or a bad smell.  Do not take baths, swim, or use a hot tub until your health care provider says it is okay to do so. Activity   Rest at home for 1-2 days, or as directed by your health care provider. ? Avoid sitting for a long time without moving. Get up to take short walks every 1-2 hours. This is important to improve blood flow and breathing. Ask for help if you feel weak or unsteady.  Return to your normal activities as told by your health care provider. Ask your health care provider what activities are safe for you.  Do not drive or use heavy machinery while taking prescription pain medicine.  Do not lift anything that is heavier than 10 lb (4.5 kg), or the limit that your health care provider tells you, until he or she says that it is safe.  Do not play contact sports for 2 weeks after the procedure. General instructions   Do not drink alcohol in the first week after the procedure.  Have someone stay with you for at least 24 hours after the procedure.  It is your responsibility to obtain your test results. Ask your health care provider, or the department that is doing the test: ? When will my  results be ready? ? How will I get my results? ? What are my treatment options? ? What other tests do I need? ? What are my next steps?  Keep all follow-up visits as told by your health care provider. This is important. Contact a health care provider if:  You have increased bleeding from an incision, resulting in more than a small spot of blood.  You have redness, swelling, or increasing pain in any incisions.  You notice a discharge or a bad smell coming from any of your incisions.  You have a fever or  chills. Get help right away if:  You develop swelling, bloating, or pain in your abdomen.  You become dizzy or faint.  You develop a rash.  You have nausea or you vomit.  You faint, or you have shortness of breath or difficulty breathing.  You develop chest pain.  You have problems with your speech or vision.  You have trouble with your balance or moving your arms or legs. Summary  After the liver biopsy, it is common to have pain, soreness, and bruising in the area, as well as sleepiness and fatigue.  Take over-the-counter and prescription medicines only as told by your health care provider.  Follow instructions from your health care provider about how to care for your incision. Check the incision area daily for signs of infection. This information is not intended to replace advice given to you by your health care provider. Make sure you discuss any questions you have with your health care provider. Document Revised: 12/06/2018 Document Reviewed: 10/23/2017 Elsevier Patient Education  [place]. Moderate Conscious Sedation, Adult Sedation is the use of medicines to promote relaxation and relieve discomfort and anxiety. Moderate conscious sedation is a type of sedation. Under moderate conscious sedation, you are less alert than normal, but you are still able to respond to instructions, touch, or both. Moderate conscious sedation is used during short medical and dental procedures. It is milder than deep sedation, which is a type of sedation under which you cannot be easily woken up. It is also milder than general anesthesia, which is the use of medicines to make you unconscious. Moderate conscious sedation allows you to return to your regular activities sooner. Tell a health care provider about:  Any allergies you have.  All medicines you are taking, including vitamins, herbs, eye drops, creams, and over-the-counter medicines.  Use of steroids (by mouth or creams).  Any  problems you or family members have had with sedatives and anesthetic medicines.  Any blood disorders you have.  Any surgeries you have had.  Any medical conditions you have, such as sleep apnea.  Whether you are pregnant or may be pregnant.  Any use of cigarettes, alcohol, marijuana, or street drugs. What are the risks? Generally, this is a safe procedure. However, problems may occur, including:  Getting too much medicine (oversedation).  Nausea.  Allergic reaction to medicines.  Trouble breathing. If this happens, a breathing tube may be used to help with breathing. It will be removed when you are awake and breathing on your own.  Heart trouble.  Lung trouble. What happens before the procedure? Staying hydrated Follow instructions from your health care provider about hydration, which may include:  Up to 2 hours before the procedure - you may continue to drink clear liquids, such as water, clear fruit juice, black coffee, and plain tea. Eating and drinking restrictions Follow instructions from your health care provider about eating and drinking, which  may include:  8 hours before the procedure - stop eating heavy meals or foods such as meat, fried foods, or fatty foods.  6 hours before the procedure - stop eating light meals or foods, such as toast or cereal.  6 hours before the procedure - stop drinking milk or drinks that contain milk.  2 hours before the procedure - stop drinking clear liquids. Medicine Ask your health care provider about:  Changing or stopping your regular medicines. This is especially important if you are taking diabetes medicines or blood thinners.  Taking medicines such as aspirin and ibuprofen. These medicines can thin your blood. Do not take these medicines before your procedure if your health care provider instructs you not to.  Tests and exams  You will have a physical exam.  You may have blood tests done to show: ? How well your  kidneys and liver are working. ? How well your blood can clot. General instructions  Plan to have someone take you home from the hospital or clinic.  If you will be going home right after the procedure, plan to have someone with you for 24 hours. What happens during the procedure?  An IV tube will be inserted into one of your veins.  Medicine to help you relax (sedative) will be given through the IV tube.  The medical or dental procedure will be performed. What happens after the procedure?  Your blood pressure, heart rate, breathing rate, and blood oxygen level will be monitored often until the medicines you were given have worn off.  Do not drive for 24 hours. This information is not intended to replace advice given to you by your health care provider. Make sure you discuss any questions you have with your health care provider. Document Revised: 09/25/2017 Document Reviewed: 02/02/2016 Elsevier Patient Education  2020 [employer].

## 2020-01-10 NOTE — Procedures (Signed)
Pre Procedure Dx: Elevated LFTs Post Procedural Dx: Same  Technically successful US guided biopsy of right lobe of the liver.  EBL: None  No immediate complications.   Jay Volanda Mangine, MD Pager #: [phone]    

## 2020-01-10 NOTE — H&P (Signed)
Chief Complaint: Patient was seen in consultation today for liver core biopsy at the request of Mansouraty,Gabriel Jr.  Referring Physician(s): Mansouraty,Gabriel Jr.  Supervising Physician: Sandi Mariscal  Patient Status: [facility] - Out-pt  History of Present Illness: Angela Clay is a 86 y.o. female   HTN; HLD; prior thyroid Ca Cholecystectomy for stones Pancreatic cyst  Elevated liver enzymes over 6 mo Problem List 1. Abnormal LFTs   2. Positive antismooth muscle antibody    3. Pancreatic cyst    Denies jaundice or pain; denies pruritis or change in urine/BM  Request now for liver core biopsy per Dr Rush Landmark  Past Medical History:  Diagnosis Date  . Actinic keratosis 05/2015   Right mid central dorsal hand  . Gallstones   . GERD (gastroesophageal reflux disease)    EGD for dysphagia 2000 aprox (-)  . History of radiation therapy   . Hyperlipidemia   . Hypertension   . Hypothyroidism   . Lymphoma ([place])    neck,mlig,unspecified site dx 1970s  . OA (osteoarthritis)    severe at neck  . Osteopenia   . Syncope 11-2012   admited . w/u (-)  . Thyroid cancer [facility])     Past Surgical History:  Procedure Laterality Date  . APPENDECTOMY    . BELPHAROPTOSIS REPAIR Bilateral 2017  . BIOPSY  11/23/2019   Procedure: BIOPSY;  Surgeon: Rush Landmark Telford Nab., MD;  Location: [facility];  Service: Gastroenterology;;  . CATARACT EXTRACTION, BILATERAL Bilateral 01/10/2019   Dr.Rankin  . CHOLECYSTECTOMY  02/24/06  . ESOPHAGOGASTRODUODENOSCOPY (EGD) WITH PROPOFOL N/A 11/23/2019   Procedure: ESOPHAGOGASTRODUODENOSCOPY (EGD) WITH PROPOFOL;  Surgeon: Rush Landmark Telford Nab., MD;  Location: [facility];  Service: Gastroenterology;  Laterality: N/A;  . EXPLORATORY LAPAROTOMY     for lymphadenomopathy; appendix removed at this time  . LEFT HEART CATH AND CORONARY ANGIOGRAPHY N/A 07/20/2018   Procedure: LEFT HEART CATH AND CORONARY ANGIOGRAPHY;  Surgeon: Belva Crome, MD;   Location: [facility];  Service: Cardiovascular;  Laterality: N/A;  . THYROIDECTOMY    . TONSILLECTOMY AND ADENOIDECTOMY  1949  . TUBAL LIGATION  1972  . UPPER ESOPHAGEAL ENDOSCOPIC ULTRASOUND (EUS) N/A 11/23/2019   Procedure: UPPER ESOPHAGEAL ENDOSCOPIC ULTRASOUND (EUS);  Surgeon: Irving Copas., MD;  Location: [facility];  Service: Gastroenterology;  Laterality: N/A;  WITH BIOPSY   NEEDS 27 MINUTES  . vitamacular hole surgery Right 2020   eye    Allergies: Codeine  Medications: Prior to Admission medications   Medication Sig Start Date End Date Taking? Authorizing Provider  acetaminophen (TYLENOL) 500 MG tablet Take 500 mg by mouth every 6 (six) hours as needed for mild pain.    Yes [provider]  aspirin 81 MG tablet Take 81 mg by mouth at bedtime.    Yes [provider]  atorvastatin (LIPITOR) 40 MG tablet Take 1 tablet (40 mg total) by mouth at bedtime. 03/23/19  Yes Paz, Alda Berthold, MD  Calcium Carb-Cholecalciferol (CALCIUM 600 + D PO) Take 1 tablet by mouth daily.   Yes [provider]  Cholecalciferol (VITAMIN D3) 25 MCG (1000 UT) CAPS Take 1,000 Units by mouth daily.  12/02/19  Yes [provider]  Colchicine (MITIGARE) 0.6 MG CAPS Take 1 capsule by mouth 2 (two) times daily as needed. Patient taking differently: Take 0.6 mg by mouth 2 (two) times daily as needed (gout flare).  06/01/19  Yes Paz, Alda Berthold, MD  hydrochlorothiazide (HYDRODIURIL) 25 MG tablet Take 1 tablet  by mouth once daily Patient taking differently: Take 25 mg by mouth daily.  11/04/19  Yes Colon Branch, MD  levothyroxine (SYNTHROID) 112 MCG tablet Take 1 tablet (112 mcg total) by mouth daily before breakfast. 12/29/19  Yes Paz, Alda Berthold, MD  Methylcellulose, Laxative, (CITRUCEL PO) Take 1 Dose by mouth 2 (two) times daily as needed (constipation).   Yes [provider]  Multiple Vitamins-Minerals (CENTRUM SILVER 50+WOMEN) TABS Take 1 tablet by mouth daily.    Yes  [provider]  omeprazole (PRILOSEC) 20 MG capsule Take 20 mg by mouth daily as needed (acid reflux).    Yes [provider]  Polyethyl Glycol-Propyl Glycol (SYSTANE ULTRA OP) Place 1 drop into both eyes daily as needed (Dry eyes).   Yes [provider]  Vitamin D, Ergocalciferol, (DRISDOL) 1.25 MG (50000 UNIT) CAPS capsule Take 1 capsule (50,000 Units total) by mouth every 7 (seven) days. Patient taking differently: Take 50,000 Units by mouth every Friday.  12/02/19  Yes Colon Branch, MD     Family History  Problem Relation Age of Onset  . Stroke Mother 85  . Coronary artery disease Father        MI age 45  . Deep vein thrombosis Brother        PE at age 14  . Uterine cancer Sister   . Heart failure Sister   . Diabetes Neg Hx   . Colon cancer Neg Hx   . Breast cancer Neg Hx   . Inflammatory bowel disease Neg Hx   . Liver disease Neg Hx   . Pancreatic cancer Neg Hx   . Rectal cancer Neg Hx   . Stomach cancer Neg Hx     Social History   Socioeconomic History  . Marital status: Married    Spouse name: Iona Beard  . Number of children: 3  . Years of education: Not on file  . Highest education level: Not on file  Occupational History  . Occupation: retired  Tobacco Use  . Smoking status: Never Smoker  . Smokeless tobacco: Never Used  Substance and Sexual Activity  . Alcohol use: No    Alcohol/week: 0.0 standard drinks  . Drug use: No  . Sexual activity: Not Currently    Partners: Male    Birth control/protection: Post-menopausal  Other Topics Concern  . Not on file  Social History Narrative   3 kids, 8 GK   Lives w/ husband   Lost a g-child to suicide 2017      Social Determinants of Health   Financial Resource Strain:   . Difficulty of Paying Living Expenses:   Food Insecurity:   . Worried About Charity fundraiser in the Last Year:   . Arboriculturist in the Last Year:   Transportation Needs:   . Film/video editor (Medical):   Marland Kitchen  Lack of Transportation (Non-Medical):   Physical Activity:   . Days of Exercise per Week:   . Minutes of Exercise per Session:   Stress:   . Feeling of Stress :   Social Connections:   . Frequency of Communication with Friends and Family:   . Frequency of Social Gatherings with Friends and Family:   . Attends Religious Services:   . Active Member of Clubs or Organizations:   . Attends Archivist Meetings:   Marland Kitchen Marital Status:      Review of Systems: A 12 point ROS discussed and pertinent positives are indicated  in the HPI above.  All other systems are negative.  Review of Systems  Constitutional: Negative for activity change, fatigue and fever.  Respiratory: Negative for cough and shortness of breath.   Cardiovascular: Negative for chest pain.  Gastrointestinal: Negative for abdominal pain and nausea.  Musculoskeletal: Negative for back pain.  Psychiatric/Behavioral: Negative for behavioral problems and confusion.    Vital Signs: BP (!) 145/83   Pulse 92   Temp (!) 97.3 F (36.3 C) (Skin)   Resp 16   Ht 5' 3"  (1.6 m)   Wt 147 lb (66.7 kg)   SpO2 98%   BMI 26.04 kg/m   Physical Exam Vitals reviewed.  Cardiovascular:     Rate and Rhythm: Normal rate and regular rhythm.     Heart sounds: Normal heart sounds.  Pulmonary:     Effort: Pulmonary effort is normal.     Breath sounds: Normal breath sounds.  Abdominal:     Palpations: Abdomen is soft.     Tenderness: There is no abdominal tenderness.  Musculoskeletal:        General: Normal range of motion.  Skin:    General: Skin is warm and dry.  Neurological:     Mental Status: She is alert and oriented to person, place, and time.  Psychiatric:        Behavior: Behavior normal.        Thought Content: Thought content normal.        Judgment: Judgment normal.     Imaging: No results found.  Labs:  CBC: Recent Labs    06/28/19 1150 07/21/19 1003 12/29/19 1505 01/10/20 0618  WBC 6.2 6.4 7.2 6.4   HGB 12.5 12.4 13.1 13.0  HCT 39.4 38.7 39.5 40.2  PLT 255 222 239.0 173    COAGS: Recent Labs    08/01/19 0935 12/29/19 1505 01/10/20 0618  INR 1.1* 1.1* 1.0    BMP: Recent Labs    06/28/19 1150 07/21/19 1003 11/30/19 1024 12/29/19 1505  NA 135 135 136 135  K 3.2* 4.1 4.2 3.5  CL 96* 98 98 97  CO2 29 29 31  32  GLUCOSE 203* 224* 97 83  BUN 10 12 15 16   CALCIUM 9.8 10.0 9.8 10.7*  CREATININE 1.03* 0.92 0.82 0.85  GFRNONAA 50* 58*  --   --   GFRAA 58* >60  --   --     LIVER FUNCTION TESTS: Recent Labs    08/01/19 0935 08/29/19 0922 11/23/19 0711 12/29/19 1505  BILITOT 0.8 0.8 1.4* 0.8  AST 33 29 44* 27  ALT 31 29 33 24  ALKPHOS 206* 203*  171* 195* 160*  PROT 8.6* 8.6* 8.7* 8.9*  ALBUMIN 4.2 4.0 3.7 4.1    TUMOR MARKERS: No results for input(s): AFPTM, CEA, CA199, CHROMGRNA in the last 8760 hours.  Assessment and Plan:  Elevated LFTs over 6 mo Scheduled for liver core biopsy Risks and benefits of liver core bx was discussed with the patient and/or patient's family including, but not limited to bleeding, infection, damage to adjacent structures or low yield requiring additional tests.  All of the questions were answered and there is agreement to proceed. Consent signed and in chart.   Thank you for this interesting consult.  I greatly enjoyed meeting Angela Clay and look forward to participating in their care.  A copy of this report was sent to the requesting provider on this date.  Electronically Signed: Lavonia Drafts, PA-C 01/10/2020, 7:13 AM  I spent a total of  30 Minutes   in face to face in clinical consultation, greater than 50% of which was counseling/coordinating care for liver core bx

## 2020-01-12 LAB — SURGICAL PATHOLOGY

## 2020-01-19 ENCOUNTER — Telehealth: Payer: Self-pay | Admitting: Gastroenterology

## 2020-01-19 NOTE — Telephone Encounter (Signed)
Angela Clay, please let patient know I will call her today or tomorrow. Thank you. GM

## 2020-01-19 NOTE — Telephone Encounter (Signed)
Please see note below and advise  

## 2020-01-19 NOTE — Telephone Encounter (Signed)
Pt notified via mychart

## 2020-01-20 NOTE — Telephone Encounter (Signed)
Noted  

## 2020-01-20 NOTE — Telephone Encounter (Signed)
I called and spoke with the patient on the evening of 3/25. We went over the results of the liver biopsy. Patient's biopsy results have a suggestion of autoimmune hepatitis in the setting of interface.  There are some changes that could be concerning for underlying PBC as well.  Neither however overall in the setting of her liver tests seem to be consistent.  She may have an overlap of AIH-PBC as well. I would like the patient to undergo a liver biopsy pathology consultation at [facility]. I am in the process of gaining the information on how to have that happen. Once I have received the information I will forward that to [place] and she will work on getting the pathology sent to [facility]. It will be helpful as well to see if we can understand what the cost would be for a potential consultation so that the patient is aware of that. The patient is appreciative for the results being discussed. Due to her age and other medical comorbidities she understands that the risk of immune suppression if this were autoimmune hepatitis needs to be understood as she would want to try to minimize problems or side effect profile for medications. Ending up treating her with ursodiol for PBC may not be unreasonable either but I think having a good repeat pathology consultation to confirm the current pathology results will help guide things for her in the long-term.  Vaughan Basta, I will reach out to [facility] or whomever the nurse covering for her is after I obtain a follow-up email from [facility] pathology.  Justice Britain, MD [facility] Advanced Endoscopy Office # [phone]

## 2020-01-22 ENCOUNTER — Encounter: Payer: Self-pay | Admitting: Gastroenterology

## 2020-02-01 ENCOUNTER — Other Ambulatory Visit: Payer: Self-pay | Admitting: Internal Medicine

## 2020-02-01 ENCOUNTER — Encounter: Payer: Self-pay | Admitting: Gastroenterology

## 2020-02-01 NOTE — Progress Notes (Signed)
Patty, This is the information that I have received in regards to the patient's liver biopsy pathology being sent to [facility] pathology for a consultation/second opinion. Please work on obtaining the pathology stains and getting them sent over to [facility]. If you have any issues please let me know.     "Express mail the stained slides (all slides including the H&E and trichrome and all other special stains), plus if possible send 5 unstained slides. (You may likely have to get your staff assistant to speak to the local pathology practice and get them to send the slides directly to [place].)  Also, we need the labs and clinical history, very important for [place] to really give a solid consult.    Mail the slides and clinical info print outs to: Dr Earley Abide C/O [facility] Dept of Pathology [address], [place], [place] [address] Phone [phone]    Clinical history to give to [facility] for their interpretation would be the following: This is an 86 year old female with a past medical history significant for hypertension, hyperlipidemia, hypothyroidism, prior thyroid cancer, status post cholecystectomy, osteoarthritis, branch duct IPMN, GERD.  The patient presented to her primary care provider in July 2020.  At that time her AST/ALT were mildly elevated at 44/45.  Her total bilirubin was 1.0.  Her alkaline phosphatase was 215.  These were rechecked within a month and alkaline phosphatase had elevated to 256 but the liver transferases of AST/ALT were grossly unchanged with a normal bilirubin.  Over the course of the last 6 to 8 months and extensive liver work-up was performed.  Biochemically the patient had an elevated anti-smooth muscle antibody of 89 (upper limit of normal 20) her ANA was negative.  Her antimitochondrial antibody was normal.  Imaging was performed including an ultrasound of the abdomen and subsequently an MRI/MRCP.  There was no evidence of bile duct  stricturing.  She had some hepatic steatosis.  An endoscopic ultrasound showed no evidence of retained stone to rule out large duct obstruction.  Her most recent set of liver biochemical testing was performed in March showing normalization of her AST and ALT as well as a bilirubin of 0.8 her alk phos had decreased to 160 which is the lowest it has been since 2020.  A liver biopsy was performed.  The results of the liver biopsy show:  FINAL MICROSCOPIC DIAGNOSIS:  A. 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 There is an isolated non-necrotizing granuloma within a portal tract. Additionally, portal tracts show  somewhat patchy inflammation with few lymphoid aggregates and patchy mild to focally moderate interface hepatitis. Numerous plasma cells are present. Native bile ducts show evidence of mild injury with some ductular reaction. Hepatic lobules show mild steatosis with mild  ballooning degeneration. Few foci of lobular necroinflammation are also  seen, with increased plasma cells. Iron and PAS-D stains show no further pathologic features. Trichrome and reticulin stains show patchy fibrosis with some completely normal portal tracts to some showing fibrous septa with focal, possible bridging.  Differential for granulomatous inflammation mainly includes primary  biliary cholangitis (PBC) and sarcoidosis. Presence of interface  hepatitis with prominent plasma cells and foci of lobular  necroinflammation may suggest concurrent mildly  active autoimmune  hepatitis. Clinical evaluation, including radiologic and serologic  studies is suggested.    I greatly appreciate the opportunity for you all to review her pathology.  At 86 years of age we are wanting to be as stop  follow-up before the use of any immune suppression.  If however, your diagnosis would be consistent with autoimmune hepatitis and/or PBC or overlap we would move forward with likely starting ursodiol and/or consider immunosuppressive therapy.  All of her viral hepatitis and work-up has been negative otherwise.  Thank you again.    Patty, please let me know if there are any issues with the slides being sent. Thanks. GM

## 2020-02-02 NOTE — Progress Notes (Signed)
Thanks. GM

## 2020-02-02 NOTE — Progress Notes (Signed)
Dr Rush Landmark I spoke with pathology and they need for [facility] to send a request with all the information to [phone].  Including the [employer] account number.  I called the number listed on this message however, this is a fax number. Do you have the phone number I can call to have them fax the request to our pathology department?

## 2020-02-02 NOTE — Progress Notes (Signed)
I have sent a fax to Landry Mellow at pathology at [facility] to have slides sent as requested to [phone].

## 2020-02-02 NOTE — Progress Notes (Signed)
Chong Sicilian, This is a number I found online to try: Phone: [phone] The [facility] Chief Administrator is Amy Haig Prophet (I found on internet) at Phone: [phone]. Let me know if you still have issues. Thank you. GM

## 2020-02-20 ENCOUNTER — Telehealth: Payer: Self-pay | Admitting: Gastroenterology

## 2020-02-21 NOTE — Telephone Encounter (Signed)
Thanks for the update

## 2020-02-21 NOTE — Telephone Encounter (Signed)
Called and spoke with the patient this afternoon. Results have been reviewed briefly. I am awaiting Dr. Luvenia Heller ([facility]) and Dr. Edison Pace [facility]) for further discussion prior to next steps in evaluation and potential treatment. Thanks. GM

## 2020-02-21 NOTE — Telephone Encounter (Signed)
Dr. Rush Landmark- Mrs.Aman called today asking about follow-up on her liver bx results. I called [facility] to make sure that slides were sent. Per Burna Mortimer slides were sent out on 02/07/20 to Attn: Dr.Cythnia Luvenia Heller. I tried calling [facility] to follow-up but had to leave a message at [phone].

## 2020-02-25 NOTE — Telephone Encounter (Signed)
Called and spoke with the patient. Discussed that the results of follow-up pathology are most suggestive of an autoimmune hepatitis with some steatohepatitis. Patient does not have significant risk factors for metabolic associated liver disease and she is already had a normal BMI. Discussed consideration of steroids versus steroid sparing agents. Patient has history of previous lymphoma as well as skin cancers. Many of the immunosuppressive agents may have risk factors and the patient is not sure that she wants to move forward with treatment as of yet. Offered a potential discussion/referral to [facility] with Dr. Edison Pace to discuss next steps and possible treatment. Patient currently defers on this unless a telemedicine visit were possible due to ability of actually getting to [facility]. I will touch base with Dr. Edison Pace to see if a telemedicine visit may be possible. In interim, will plan a 3 to 4-week follow-up in clinic with a hepatic function panel and INR and immunoglobulin profile being done tomorrow to 3 days prior to clinic visit. RN Gerarda Fraction, please help with arranging the follow-up clinic visit and labs. Thanks. GM

## 2020-02-27 ENCOUNTER — Other Ambulatory Visit: Payer: Self-pay

## 2020-02-27 ENCOUNTER — Encounter: Payer: Self-pay | Admitting: Gastroenterology

## 2020-02-27 DIAGNOSIS — R945 Abnormal results of liver function studies: Secondary | ICD-10-CM

## 2020-02-27 DIAGNOSIS — R7989 Other specified abnormal findings of blood chemistry: Secondary | ICD-10-CM

## 2020-02-27 DIAGNOSIS — K754 Autoimmune hepatitis: Secondary | ICD-10-CM

## 2020-02-27 NOTE — Telephone Encounter (Signed)
Lab order entered and ROV scheduled for 03/30/20 at 1030 am

## 2020-02-27 NOTE — Progress Notes (Signed)
The patient's liver biopsy results will be scanned into the chart. They were pulled up on care everywhere today. They are as follows  A. LIVER, SITE NOT OTHERWISE SPECIFIED, RANDOM NEEDLE CORE BIOPSY, outside consult, [phone], [place], [place], date of procedure 01/10/2020: LIVER WITH PATCHY MILD TO MODERATE PORTAL CHRONIC INFLAMMATION, FOCAL IMMUNE-MEDIATED BILE DUCT INJURY, AND PATCHY MILD INTERFACE AND LOBULAR HEPATITIS WITH PLASMA CELLS. See comment and microscopic description. PERIPORTAL FIBROSIS. STEATOHEPATITIS WITH MILD CENTRILOBULAR PERICELLULAR FIBROSIS.  COMMENT: The liver biopsy shows mild steatosis and many ballooned hepatocytes with focal centrilobular pericellular fibrosis. These findings are consistent with steatohepatitis.  In addition, several portal triads show moderate chronic inflammation and patchy mild interface and lobular hepatitis with groups plasma cells, these findings raise the possibility of autoimmune hepatitis. If the laboratory values are indicative of autoimmune hepatitis, these morphologic features would be supportive.  Furthermore, some interlobular bile ducts show features of mild immune-mediated injury, and copper histochemistry shows focal periportal copper deposition suggestive of cholestasis. Therefore, if the laboratory values are indicative of primary biliary cholangitis, these morphologic features would be supportive.  This is for documentation purposes.  Justice Britain, MD [facility] Advanced Endoscopy Office # [phone]

## 2020-02-27 NOTE — Telephone Encounter (Signed)
The pt has been advised and information available on My Chart

## 2020-02-29 DIAGNOSIS — L80 Vitiligo: Secondary | ICD-10-CM | POA: Diagnosis not present

## 2020-02-29 DIAGNOSIS — L570 Actinic keratosis: Secondary | ICD-10-CM | POA: Diagnosis not present

## 2020-02-29 DIAGNOSIS — L821 Other seborrheic keratosis: Secondary | ICD-10-CM | POA: Diagnosis not present

## 2020-02-29 DIAGNOSIS — L814 Other melanin hyperpigmentation: Secondary | ICD-10-CM | POA: Diagnosis not present

## 2020-02-29 DIAGNOSIS — L720 Epidermal cyst: Secondary | ICD-10-CM | POA: Diagnosis not present

## 2020-02-29 DIAGNOSIS — Z85828 Personal history of other malignant neoplasm of skin: Secondary | ICD-10-CM | POA: Diagnosis not present

## 2020-03-12 ENCOUNTER — Ambulatory Visit: Payer: PPO | Admitting: Cardiovascular Disease

## 2020-03-17 ENCOUNTER — Other Ambulatory Visit: Payer: Self-pay | Admitting: Internal Medicine

## 2020-03-22 ENCOUNTER — Other Ambulatory Visit (INDEPENDENT_AMBULATORY_CARE_PROVIDER_SITE_OTHER): Payer: PPO

## 2020-03-22 DIAGNOSIS — R945 Abnormal results of liver function studies: Secondary | ICD-10-CM | POA: Diagnosis not present

## 2020-03-22 DIAGNOSIS — K754 Autoimmune hepatitis: Secondary | ICD-10-CM | POA: Diagnosis not present

## 2020-03-22 DIAGNOSIS — R7989 Other specified abnormal findings of blood chemistry: Secondary | ICD-10-CM

## 2020-03-22 LAB — HEPATIC FUNCTION PANEL
ALT: 23 U/L (ref 0–35)
AST: 23 U/L (ref 0–37)
Albumin: 4.1 g/dL (ref 3.5–5.2)
Alkaline Phosphatase: 136 U/L — ABNORMAL HIGH (ref 39–117)
Bilirubin, Direct: 0.2 mg/dL (ref 0.0–0.3)
Total Bilirubin: 0.8 mg/dL (ref 0.2–1.2)
Total Protein: 8.1 g/dL (ref 6.0–8.3)

## 2020-03-22 LAB — PROTIME-INR
INR: 1.1 ratio — ABNORMAL HIGH (ref 0.8–1.0)
Prothrombin Time: 12.2 s (ref 9.6–13.1)

## 2020-03-23 LAB — IGG 4: IgG, Subclass 4: 3 mg/dL (ref 2–96)

## 2020-03-30 ENCOUNTER — Ambulatory Visit: Payer: PPO | Admitting: Gastroenterology

## 2020-03-30 ENCOUNTER — Encounter: Payer: Self-pay | Admitting: Gastroenterology

## 2020-03-30 VITALS — BP 116/64 | HR 86 | Ht 63.0 in | Wt 147.8 lb

## 2020-03-30 DIAGNOSIS — R945 Abnormal results of liver function studies: Secondary | ICD-10-CM

## 2020-03-30 DIAGNOSIS — K754 Autoimmune hepatitis: Secondary | ICD-10-CM | POA: Diagnosis not present

## 2020-03-30 DIAGNOSIS — K862 Cyst of pancreas: Secondary | ICD-10-CM | POA: Diagnosis not present

## 2020-03-30 DIAGNOSIS — R7989 Other specified abnormal findings of blood chemistry: Secondary | ICD-10-CM

## 2020-03-30 NOTE — Patient Instructions (Signed)
You have been scheduled for an MRI at [facility]  on 06/19/20. Your appointment time is 9:00am. Please arrive 30 minutes prior to your appointment time for registration purposes. Please make certain not to have anything to eat or drink 6 hours prior to your test. In addition, if you have any metal in your body, have a pacemaker or defibrillator, please be sure to let your ordering physician know. This test typically takes 45 minutes to 1 hour to complete. Should you need to reschedule, please call [phone] to do so.  If you are age 73 or older, your body mass index should be between 23-30. Your Body mass index is 26.18 kg/m. If this is out of the aforementioned range listed, please consider follow up with your Primary Care Provider.  If you are age 80 or younger, your body mass index should be between 19-25. Your Body mass index is 26.18 kg/m. If this is out of the aformentioned range listed, please consider follow up with your Primary Care Provider.    Due to recent changes in healthcare laws, you may see the results of your imaging and laboratory studies on MyChart before your provider has had a chance to review them.  We understand that in some cases there may be results that are confusing or concerning to you. Not all laboratory results come back in the same time frame and the provider may be waiting for multiple results in order to interpret others.  Please give [place] 48 hours in order for your provider to thoroughly review all the results before contacting the office for clarification of your results.   We have sent a referral to Dr. Teena Irani at [facility]. Their office will contact you for an appointment.   Thank you for choosing me and [facility].  Dr. Rush Landmark

## 2020-03-30 NOTE — Progress Notes (Signed)
GASTROENTEROLOGY OUTPATIENT CLINIC VISIT   Primary Care Provider Colon Branch, MD [address] [place] [address] [phone]  Patient Profile: Angela Clay is a 86 y.o. 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  The patient presents to the [facility] for an evaluation and management of problem(s) noted below:  Problem List 1. Autoimmune hepatitis ([place])   2. Abnormal LFTs   3. Pancreatic cyst     History of Present Illness Please see initial consultation note and progress notes for full details of HPI.  Interval History Today, the patient returns for scheduled follow-up.  Since our last visit, the patient has undergone a liver biopsy and I have had the pathology review both here and in the home as well as at [facility].  Her liver biopsy has findings that are concerning for autoimmune Titus.  We briefly discussed over the phone after the [place] pathology consultation for potential role of immunosuppressive therapy including 6-MP or azathioprine or mycophenolate or steroid therapy.  She has been very hesitant about it.  We repeated labs earlier this week and her alkaline phosphatase continues to improve without doing anything other than monitoring closely.  Patient describes no significant symptoms or concerns at this time.  She is not sure she wants to undergo any sort of immunosuppressive therapy even if side effect profile is low.  She is doing well from a dysphagia perspective.  She is planning to go to [place] for a visit in July.  The patient denies any issues with jaundice, scleral icterus, pruritus, darkened/amber urine, clay-colored stools, LE edema, hematemesis, coffee-ground emesis, abdominal distention,  confusion, new generalized pruritus.  GI Review of Systems Positive as above Negative for nausea, vomiting, change in bowel habits, melena, easy, anorexia  Review of Systems General: Denies fevers/chills/weight loss unintentionally Cardiovascular: Denies chest pain/palpitations Pulmonary: Denies shortness of breath Gastroenterological: See HPI Genitourinary: Denies darkened urine Hematological: Denies easy bruising/bleeding Dermatological: Denies jaundice Psychological: Mood is stable   Medications Current Outpatient Medications  Medication Sig Dispense Refill  . acetaminophen (TYLENOL) 500 MG tablet Take 500 mg by mouth every 6 (six) hours as needed for mild pain.     Marland Kitchen aspirin 81 MG tablet Take 81 mg by mouth at bedtime.     Marland Kitchen atorvastatin (LIPITOR) 40 MG tablet Take 1 tablet (40 mg total) by mouth at bedtime. 90 tablet 0  . Colchicine (MITIGARE) 0.6 MG CAPS Take 1 capsule by mouth 2 (two) times daily as needed. (Patient taking differently: Take 0.6 mg by mouth 2 (two) times daily as needed (gout flare). ) 60 capsule 0  . hydrochlorothiazide (HYDRODIURIL) 25 MG tablet Take 1 tablet by mouth once daily 90 tablet 0  . levothyroxine (SYNTHROID) 112 MCG tablet Take 1 tablet (112 mcg total) by mouth daily before breakfast. 90 tablet 1  . Multiple Vitamins-Minerals (CENTRUM SILVER 50+WOMEN) TABS Take 1 tablet by mouth daily.     Marland Kitchen omeprazole (PRILOSEC) 20 MG capsule Take 20 mg by mouth daily as needed (acid reflux).     Vladimir Faster Glycol-Propyl Glycol (SYSTANE ULTRA OP) Place 1 drop into both eyes daily as needed (Dry eyes).    . Calcium Carb-Cholecalciferol (CALCIUM 600 + D PO) Take 1 tablet by mouth daily.    . Cholecalciferol (VITAMIN D3) 25 MCG (1000 UT) CAPS  Take 1,000 Units by mouth daily.     . Methylcellulose, Laxative, (CITRUCEL PO) Take 1 Dose by mouth 2 (two) times daily as needed (constipation).    . Vitamin D, Ergocalciferol, (DRISDOL) 1.25 MG (50000 UNIT) CAPS capsule Take  1 capsule (50,000 Units total) by mouth every 7 (seven) days. (Patient not taking: Reported on 03/30/2020) 12 capsule 0   No current facility-administered medications for this visit.    Allergies Allergies  Allergen Reactions  . Codeine Nausea And Vomiting    Histories Past Medical History:  Diagnosis Date  . Actinic keratosis 05/2015   Right mid central dorsal hand  . Gallstones   . GERD (gastroesophageal reflux disease)    EGD for dysphagia 2000 aprox (-)  . History of radiation therapy   . Hyperlipidemia   . Hypertension   . Hypothyroidism   . Lymphoma ([place])    neck,mlig,unspecified site dx 1970s  . OA (osteoarthritis)    severe at neck  . Osteopenia   . Syncope 11-2012   admited . w/u (-)  . Thyroid cancer [facility])    Past Surgical History:  Procedure Laterality Date  . APPENDECTOMY    . BELPHAROPTOSIS REPAIR Bilateral 2017  . BIOPSY  11/23/2019   Procedure: BIOPSY;  Surgeon: Rush Landmark Telford Nab., MD;  Location: [facility];  Service: Gastroenterology;;  . CATARACT EXTRACTION, BILATERAL Bilateral 01/10/2019   Dr.Rankin  . CHOLECYSTECTOMY  02/24/06  . ESOPHAGOGASTRODUODENOSCOPY (EGD) WITH PROPOFOL N/A 11/23/2019   Procedure: ESOPHAGOGASTRODUODENOSCOPY (EGD) WITH PROPOFOL;  Surgeon: Rush Landmark Telford Nab., MD;  Location: [facility];  Service: Gastroenterology;  Laterality: N/A;  . EXPLORATORY LAPAROTOMY     for lymphadenomopathy; appendix removed at this time  . LEFT HEART CATH AND CORONARY ANGIOGRAPHY N/A 07/20/2018   Procedure: LEFT HEART CATH AND CORONARY ANGIOGRAPHY;  Surgeon: Belva Crome, MD;  Location: [facility];  Service: Cardiovascular;  Laterality: N/A;  . THYROIDECTOMY    . TONSILLECTOMY AND ADENOIDECTOMY  1949  . TUBAL LIGATION  1972  . UPPER ESOPHAGEAL ENDOSCOPIC ULTRASOUND (EUS) N/A 11/23/2019   Procedure: UPPER ESOPHAGEAL ENDOSCOPIC ULTRASOUND (EUS);  Surgeon: Irving Copas., MD;  Location: [facility];  Service: Gastroenterology;   Laterality: N/A;  WITH BIOPSY   NEEDS 90 MINUTES  . vitamacular hole surgery Right 2020   eye   Social History   Socioeconomic History  . Marital status: Married    Spouse name: Iona Beard  . Number of children: 3  . Years of education: Not on file  . Highest education level: Not on file  Occupational History  . Occupation: retired  Tobacco Use  . Smoking status: Never Smoker  . Smokeless tobacco: Never Used  Substance and Sexual Activity  . Alcohol use: No    Alcohol/week: 0.0 standard drinks  . Drug use: No  . Sexual activity: Not Currently    Partners: Male    Birth control/protection: Post-menopausal  Other Topics Concern  . Not on file  Social History Narrative   3 kids, 8 GK   Lives w/ husband   Lost a g-child to suicide 2017      Social Determinants of Health   Financial Resource Strain:   . Difficulty of Paying Living Expenses:   Food Insecurity:   . Worried About Charity fundraiser in the Last Year:   . Arboriculturist in the Last Year:   Transportation Needs:   . Film/video editor (Medical):   Marland Kitchen Lack of Transportation (Non-Medical):  Physical Activity:   . Days of Exercise per Week:   . Minutes of Exercise per Session:   Stress:   . Feeling of Stress :   Social Connections:   . Frequency of Communication with Friends and Family:   . Frequency of Social Gatherings with Friends and Family:   . Attends Religious Services:   . Active Member of Clubs or Organizations:   . Attends Archivist Meetings:   Marland Kitchen Marital Status:   Intimate Partner Violence:   . Fear of Current or Ex-Partner:   . Emotionally Abused:   Marland Kitchen Physically Abused:   . Sexually Abused:    Family History  Problem Relation Age of Onset  . Stroke Mother 84  . Coronary artery disease Father        MI age 35  . Deep vein thrombosis Brother        PE at age 64  . Uterine cancer Sister   . Heart failure Sister   . Diabetes Neg Hx   . Colon cancer Neg Hx   . Breast cancer  Neg Hx   . Inflammatory bowel disease Neg Hx   . Liver disease Neg Hx   . Pancreatic cancer Neg Hx   . Rectal cancer Neg Hx   . Stomach cancer Neg Hx    I have reviewed her medical, social, and family history in detail and updated the electronic medical record as necessary.    PHYSICAL EXAMINATION  BP 116/64   Pulse 86   Ht 5' 3"  (1.6 m)   Wt 147 lb 12.8 oz (67 kg)   SpO2 99%   BMI 26.18 kg/m  Wt Readings from Last 3 Encounters:  03/30/20 147 lb 12.8 oz (67 kg)  01/10/20 147 lb (66.7 kg)  12/29/19 149 lb 12.8 oz (67.9 kg)  GEN: NAD, appears stated age, doesn't appear chronically ill PSYCH: Cooperative, without pressured speech EYE: Conjunctivae pink, sclerae anicteric ENT: MMM CV: Nontachycardic RESP: No audible wheezing GI: NABS, soft, NT/ND, without rebound MSK/EXT: No lower extremity edema SKIN: No jaundice, no spider angiomata NEURO:  Alert & Oriented x 3, no focal deficits   REVIEW OF DATA  I reviewed the following data at the time of this encounter:  GI Procedures and Studies  Liver biopsy to pathology consultation A. LIVER, SITE NOT OTHERWISE SPECIFIED, RANDOM NEEDLE CORE BIOPSY, outside consult, [phone], [place], [place], date of procedure 01/10/2020: LIVER WITH PATCHY MILD TO MODERATE PORTAL CHRONIC INFLAMMATION, FOCAL IMMUNE-MEDIATED BILE DUCT INJURY, AND PATCHY MILD INTERFACE AND LOBULAR HEPATITIS WITH PLASMA CELLS. See comment and microscopic description. PERIPORTAL FIBROSIS. STEATOHEPATITIS WITH MILD CENTRILOBULAR PERICELLULAR FIBROSIS.  COMMENT: The liver biopsy shows mild steatosis and many ballooned hepatocytes with focal centrilobular pericellular fibrosis. These findings are consistent with steatohepatitis.  In addition, several portal triads show moderate chronic inflammation and patchy mild interface and lobular hepatitis with groups plasma cells, these findings raise the possibility of autoimmune hepatitis. If the laboratory  values are indicative of autoimmune hepatitis, these morphologic features would be supportive.  Furthermore, some interlobular bile ducts show features of mild immune-mediated injury, and copper histochemistry shows focal periportal copper deposition suggestive of cholestasis. Therefore, if the laboratory values are indicative of primary biliary cholangitis, these morphologic features would be supportive.  Laboratory Studies  Reviewed those in epic  Imaging Studies  No new imaging studies to review   ASSESSMENT  Ms. Siwik is a 86 y.o. 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  The patient is seen today for evaluation and management of:  1. Autoimmune hepatitis ([place])   2. Abnormal LFTs   3. Pancreatic cyst    The patient is hemodynamically and clinically stable.  Her alkaline phosphatase continues to downtrend.  We have not started on any therapy.  After a secondary consultation with new pathology, the pathology seems to be consistent with likely underlying autoimmune hepatitis and less likely primary biliary cholangitis.  With that being said, we have not initiated any therapy.  She has no evidence on her exam that would suggest chronic liver disease or a burned-out autoimmune hepatitis.  Liver biopsy also did not show severe scarring.  With the patient's age and other medical comorbidities she is hesitant about any sort of immunosuppressive therapy.  We briefly discussed the potential role of 6-MP or azathioprine or mycophenolate or steroid therapy and she is very hesitant about moving forward with any therapy.  She would rather be watchful and waiting overall in regards to her therapeutic management.  With that being said, I think this is not unreasonable.   I would however like her to have opportunity to speak with an expert in autoimmune hepatitis.  Dr. [name] of [facility] transplant hepatology and I have briefly discussed this patient's case in the course of the last few months.  We will move forward with scheduling a clinic visit with her in the coming months.  If the decision tree after further discussion with hematology is such that we are going to watch and wait and we certainly will pursue that otherwise if therapeutic management options are entertained we will move forward with that.  From a pancreatic cyst perspective we will plan an MRI/MRCP in July or August of this year for follow-up of the pancreatic cyst noted on EUS and prior imaging.  We previously have discussed that BD-IPMNs have a low approximately 1% lifetime risk of developing pancreatic cancer is overall low risk but not zero risk.  She remains on PPI at this time.  All patient questions were answered, to the best of my ability, and the patient agrees to the aforementioned plan of action with follow-up as indicated.   PLAN  Hepatology referral to Dr. [name] of [facility] to discuss potential AIH therapies MRI/MRCP in 6 months from EUS (July/August 2021) to follow pancreatic cyst Continue PPI at current dosing Query colon cancer screening in 2024 if health is excellent but she may not require further screening based on age at that time Discussed the [place]   Orders Placed This Encounter  Procedures  . MR ABDOMEN MRCP W WO CONTAST  . Amb Referral to Hepatology    New Prescriptions   No medications on file   Modified Medications   No medications on file    Planned Follow Up No follow-ups on file.  Total Time in Face-to-Face and in Coordination of Care for patient including independent/personal interpretation/review of prior testing, medical history, examination, medication adjustment, communicating results with the patient directly, and documentation with the  EHR is 25 minutes.   Justice Britain, MD [facility] Office # [phone]

## 2020-04-01 DIAGNOSIS — K754 Autoimmune hepatitis: Secondary | ICD-10-CM | POA: Insufficient documentation

## 2020-04-03 ENCOUNTER — Encounter: Payer: Self-pay | Admitting: Internal Medicine

## 2020-04-24 ENCOUNTER — Other Ambulatory Visit: Payer: Self-pay | Admitting: Internal Medicine

## 2020-05-30 ENCOUNTER — Other Ambulatory Visit: Payer: Self-pay

## 2020-05-30 ENCOUNTER — Ambulatory Visit (HOSPITAL_COMMUNITY)
Admission: RE | Admit: 2020-05-30 | Discharge: 2020-05-30 | Disposition: A | Discharge status: Home / Self Care | Payer: PPO | Source: Ambulatory Visit | Attending: Gastroenterology | Admitting: Gastroenterology

## 2020-05-30 ENCOUNTER — Other Ambulatory Visit: Payer: Self-pay | Admitting: Gastroenterology

## 2020-05-30 DIAGNOSIS — K862 Cyst of pancreas: Secondary | ICD-10-CM

## 2020-05-30 DIAGNOSIS — K754 Autoimmune hepatitis: Secondary | ICD-10-CM | POA: Insufficient documentation

## 2020-05-30 DIAGNOSIS — N281 Cyst of kidney, acquired: Secondary | ICD-10-CM | POA: Diagnosis not present

## 2020-05-30 DIAGNOSIS — K8689 Other specified diseases of pancreas: Secondary | ICD-10-CM | POA: Diagnosis not present

## 2020-05-30 MED ORDER — GADOBUTROL 1 MMOL/ML IV SOLN
7.0000 mL | Freq: Once | INTRAVENOUS | Status: AC | PRN
  Administered 2020-05-30: 7 mL via INTRAVENOUS

## 2020-06-01 ENCOUNTER — Ambulatory Visit: Payer: Self-pay | Admitting: *Deleted

## 2020-06-05 DIAGNOSIS — K754 Autoimmune hepatitis: Secondary | ICD-10-CM | POA: Diagnosis not present

## 2020-06-05 DIAGNOSIS — R748 Abnormal levels of other serum enzymes: Secondary | ICD-10-CM | POA: Diagnosis not present

## 2020-06-20 ENCOUNTER — Telehealth: Payer: PPO

## 2020-06-21 ENCOUNTER — Other Ambulatory Visit: Payer: Self-pay | Admitting: Internal Medicine

## 2020-06-21 ENCOUNTER — Telehealth: Payer: Self-pay | Admitting: Pharmacist

## 2020-06-21 NOTE — Progress Notes (Addendum)
Chronic Care Management Pharmacy Assistant   Name: Angela Clay  MRN: [id number] DOB: April 05, 1934  Reason for Encounter: Disease State  Patient Questions:  1.  Have you seen any other providers since your last visit?Yes  2.  Any changes in your medicines or health? No     PCP : Colon Branch, MD   Their chronic conditions include: HLD, CAD, Gout, Vitamin D Deficiency, Hypertension, Hypothyroidis, Pre-Diabetes, Osteopenia  Office Visits:  Consults: 06-05-20 Angela Clay) Initial consult with Angela Clay  03-30-2020 Angela Clay) Patient presented in the office with Angela Clay for a follow up. During the appointment, patient and physician discussed liver biopsy and the findings that are concerning for autoimmune Titus. The notes indicate recommendation for the patient to start immunosuppressive therapy, but the patient was hesitant. Therapy was not started at that time. Patient was referred to an  expert in autoimmune hepatitis, Dr. [name] of [facility] hepat[facility].  02-29-2020 (Dermatology) Patient presented in the office with Angela Clay.  01-10-2020 Patient had a liver biopsy done at [facility]. The liver biopsy shows mild steatosis and many ballooned hepatocytes with focal centrilobular pericellular fibrosis. These findings are consistent with steatohepatitis.  12-29-2019(Gastro) Patient presented in the office with Angela Clay for follow up on abnormal LFTs.Liver biopsy was deferred.  Allergies:   Allergies  Allergen Reactions   Codeine Nausea And Vomiting    Medications: Outpatient Encounter Medications as of 06/21/2020  Medication Sig   acetaminophen (TYLENOL) 500 MG tablet Take 500 mg by mouth every 6 (six) hours as needed for mild pain.    aspirin 81 MG tablet Take 81 mg by mouth at bedtime.    atorvastatin (LIPITOR) 40 MG tablet TAKE 1 TABLET BY MOUTH AT BEDTIME   Calcium Carb-Cholecalciferol (CALCIUM 600 + D PO) Take 1 tablet by mouth daily.    Cholecalciferol (VITAMIN D3) 25 MCG (1000 UT) CAPS Take 1,000 Units by mouth daily.    Colchicine (MITIGARE) 0.6 MG CAPS Take 1 capsule by mouth 2 (two) times daily as needed. (Patient taking differently: Take 0.6 mg by mouth 2 (two) times daily as needed (gout flare). )   EUTHYROX 112 MCG tablet TAKE 1 TABLET BY MOUTH IN THE MORNING BEFORE BREAKFAST   hydrochlorothiazide (HYDRODIURIL) 25 MG tablet Take 1 tablet (25 mg total) by mouth daily.   Methylcellulose, Laxative, (CITRUCEL PO) Take 1 Dose by mouth 2 (two) times daily as needed (constipation).   Multiple Vitamins-Minerals (CENTRUM SILVER 50+WOMEN) TABS Take 1 tablet by mouth daily.    omeprazole (PRILOSEC) 20 MG capsule Take 20 mg by mouth daily as needed (acid reflux).    Polyethyl Glycol-Propyl Glycol (SYSTANE ULTRA OP) Place 1 drop into both eyes daily as needed (Dry eyes).   Vitamin D, Ergocalciferol, (DRISDOL) 1.25 MG (50000 UNIT) CAPS capsule Take 1 capsule (50,000 Units total) by mouth every 7 (seven) days. (Patient not taking: Reported on 03/30/2020)   No facility-administered encounter medications on file as of 06/21/2020.    Current Diagnosis: Patient Active Problem List   Diagnosis Date Noted   Autoimmune hepatitis ([place]) 04/01/2020   Candida esophagitis ([place]) 12/31/2019   Chronic gastritis without bleeding 12/31/2019   Elevated alkaline phosphatase level 10/14/2019   Pancreatic cyst 10/14/2019   Dilated bile duct 10/14/2019   Positive antismooth muscle antibody  08/08/2019   Abnormal ultrasound of liver 07/07/2019   Abnormal LFTs 07/05/2019   Abnormal SPEP 06/22/2019   Pericarditis 08/09/2018   ACS (acute coronary syndrome) ([place])  07/19/2018   Shingles 12/04/2015   Follow-up-----------------PCP notes  07/03/2015   Prolapse of female pelvic organs 03/11/2013   Dystrophy, vulva-- s/p bx, on clobetasol prn 03/11/2013   Monoarthritis of hand 03/10/2013   Hyperglycemia 12/07/2012   Annual physical  exam 02/19/2011   Carotid arterial disease ([place]) 02/19/2011   DEGENERATIVE JOINT DISEASE 04/26/2010   Hypothyroidism 01/11/2008   LYMPHOMA NEC, MLIG, UNSPECIFIED SITE 08/25/2007   Hyperlipidemia 08/25/2007   Essential hypertension 08/25/2007   Gastroesophageal reflux disease 08/25/2007   Osteopenia 08/25/2007   THYROIDECTOMY, HX OF 08/25/2007    Goals Addressed   None    Reviewed chart prior to disease state call. Spoke with patient regarding BP  Recent Office Vitals: BP Readings from Last 3 Encounters:  03/30/20 116/64  01/10/20 (!) 114/52  12/29/19 120/70   Pulse Readings from Last 3 Encounters:  03/30/20 86  01/10/20 65  12/29/19 76    Wt Readings from Last 3 Encounters:  03/30/20 147 lb 12.8 oz (67 kg)  01/10/20 147 lb (66.7 kg)  12/29/19 149 lb 12.8 oz (67.9 kg)     Kidney Function Lab Results  Component Value Date/Time   CREATININE 0.85 12/29/2019 03:05 PM   CREATININE 0.82 11/30/2019 10:24 AM   CREATININE 0.92 07/21/2019 10:03 AM   CREATININE 1.03 (H) 06/28/2019 11:50 AM   GFR 63.70 12/29/2019 03:05 PM   GFRNONAA 58 (L) 07/21/2019 10:03 AM   GFRAA >60 07/21/2019 10:03 AM    BMP Latest Ref Rng & Units 12/29/2019 11/30/2019 07/21/2019  Glucose 70 - 99 mg/dL 83 97 224(H)  BUN 6 - 23 mg/dL 16 15 12   Creatinine 0.40 - 1.20 mg/dL 0.85 0.82 0.92  Sodium 135 - 145 mEq/L 135 136 135  Potassium 3.5 - 5.1 mEq/L 3.5 4.2 4.1  Chloride 96 - 112 mEq/L 97 98 98  CO2 19 - 32 mEq/L 32 31 29  Calcium 8.4 - 10.5 mg/dL 10.7(H) 9.8 10.0     Current antihypertensive regimen:   hctz 38m daily AM  How often are you checking your Blood Pressure? weekly  Current home BP readings: patient states  What recent interventions/DTPs have been made by any provider to improve Blood Pressure control since last CPP Visit: none  Any recent hospitalizations or ED visits since last visit with CPP? No  What diet changes have been made to improve Blood Pressure Control?   o Patient states she has not made any diet changes but does try to eat more fruits and vegetables. Also incorporates tuna and sardines.  What exercise is being done to improve your Blood Pressure Control?  o Patient states she is does not do much exercise, whatever she can do around the house.  Adherence Review: Is the patient currently on ACE/ARB medication? No Does the patient have >5 day gap between last estimated fill dates? No   06/21/2020 Name: PFARRELL BROERMANMRN: [id number]: 1June 28, 1935Patsy MKATHERINNE MOFIELDis a 86y.o. year old female who is a primary care patient of PColon Branch MD.    Comprehensive medication review performed; Spoke to patient regarding cholesterol  Lipid Panel    Component Value Date/Time   CHOL 133 05/10/2019 1012   TRIG 143.0 05/10/2019 1012   HDL 40.70 05/10/2019 1012   LDLCALC 64 05/10/2019 1012   LDLDIRECT 118.5 08/25/2007 0000    10-year ASCVD risk score: The ASCVD Risk score (Mikey BussingDC Jr., et al., 2013) failed to calculate for the following reasons:   The 2013  ASCVD risk score is only valid for ages 72 to 69   Current antihyperlipidemic regimen:   atorvastatin 36m daily   Follow-Up:  Scheduled Follow-Up With Clinical Pharmacist patient states she will call after her wellness visit to schedule with clinical pharmacist.  IFanny Skates CCenterPharmacist Assistant [phone] Reviewed by: KDe Blanch PharmD Clinical Pharmacist LLagrangePrimary Care at [phone]

## 2020-06-22 DIAGNOSIS — H35371 Puckering of macula, right eye: Secondary | ICD-10-CM | POA: Diagnosis not present

## 2020-06-22 DIAGNOSIS — H524 Presbyopia: Secondary | ICD-10-CM | POA: Diagnosis not present

## 2020-06-22 DIAGNOSIS — Z961 Presence of intraocular lens: Secondary | ICD-10-CM | POA: Diagnosis not present

## 2020-06-22 DIAGNOSIS — H52203 Unspecified astigmatism, bilateral: Secondary | ICD-10-CM | POA: Diagnosis not present

## 2020-06-22 LAB — HM DIABETES EYE EXAM

## 2020-06-27 NOTE — Progress Notes (Signed)
I connected with Angela Clay today by telephone and verified that I am speaking with the correct person using two identifiers. Location patient: home Location provider: work Persons participating in the virtual visit: patient, Marine scientist.    I discussed the limitations, risks, security and privacy concerns of performing an evaluation and management service by telephone and the availability of in person appointments. I also discussed with the patient that there may be a patient responsible charge related to this service. The patient expressed understanding and verbally consented to this telephonic visit.    Interactive audio and video telecommunications were attempted between this provider and patient, however failed, due to patient having technical difficulties OR patient did not have access to video capability.  We continued and completed visit with audio only.  Some vital signs may be absent or patient reported.   Subjective:   Angela Clay is a 86 y.o. female who presents for Medicare Annual (Subsequent) preventive examination.  Review of Systems     Cardiac Risk Factors include: advanced age (>36mn, >>62women);hypertension     Objective:    Today's Vitals   06/28/20 1303  BP: 116/73  Pulse: 74  Weight: 145 lb (65.8 kg)   Body mass index is 25.69 kg/m.  Advanced Directives 06/28/2020 01/10/2020 11/23/2019 07/21/2019 06/28/2019 05/31/2019 07/20/2018  Does Patient Have a Medical Advance Directive? Yes Yes Yes Yes Yes Yes Yes  Type of Advance Directive Living will Living will Living will [place] Living will [place] will  Does patient want to make changes to medical advance directive? No - Patient declined No - Patient declined - No - Patient declined - No - Patient declined No - Patient declined  Copy of [place] Chart? - - - No - copy requested No - copy requested - No -  copy requested    Current Medications (verified) Outpatient Encounter Medications as of 06/28/2020  Medication Sig  . acetaminophen (TYLENOL) 500 MG tablet Take 500 mg by mouth every 6 (six) hours as needed for mild pain.   .Marland Kitchenaspirin 81 MG tablet Take 81 mg by mouth at bedtime.   .Marland Kitchenatorvastatin (LIPITOR) 40 MG tablet TAKE 1 TABLET BY MOUTH AT BEDTIME  . Calcium Carb-Cholecalciferol (CALCIUM 600 + D PO) Take 1 tablet by mouth daily.  . Cholecalciferol (VITAMIN D3) 25 MCG (1000 UT) CAPS Take 1,000 Units by mouth daily.   . Colchicine (MITIGARE) 0.6 MG CAPS Take 1 capsule by mouth 2 (two) times daily as needed. (Patient taking differently: Take 0.6 mg by mouth 2 (two) times daily as needed (gout flare). )  . EUTHYROX 112 MCG tablet TAKE 1 TABLET BY MOUTH IN THE MORNING BEFORE BREAKFAST  . hydrochlorothiazide (HYDRODIURIL) 25 MG tablet Take 1 tablet (25 mg total) by mouth daily.  . Multiple Vitamins-Minerals (CENTRUM SILVER 50+WOMEN) TABS Take 1 tablet by mouth daily.   .Marland Kitchenomeprazole (PRILOSEC) 20 MG capsule Take 20 mg by mouth daily as needed (acid reflux).   .Vladimir FasterGlycol-Propyl Glycol (SYSTANE ULTRA OP) Place 1 drop into both eyes daily as needed (Dry eyes).  . Vitamin D, Ergocalciferol, (DRISDOL) 1.25 MG (50000 UNIT) CAPS capsule Take 1 capsule (50,000 Units total) by mouth every 7 (seven) days.  . [DISCONTINUED] Methylcellulose, Laxative, (CITRUCEL PO) Take 1 Dose by mouth 2 (two) times daily as needed (constipation).   No facility-administered encounter medications on file as of 06/28/2020.    Allergies (  verified) Codeine   History: Past Medical History:  Diagnosis Date  . Actinic keratosis 05/2015   Right mid central dorsal hand  . Gallstones   . GERD (gastroesophageal reflux disease)    EGD for dysphagia 2000 aprox (-)  . History of radiation therapy   . Hyperlipidemia   . Hypertension   . Hypothyroidism   . Lymphoma ([place])    neck,mlig,unspecified site dx 1970s  . OA  (osteoarthritis)    severe at neck  . Osteopenia   . Syncope 11-2012   admited . w/u (-)  . Thyroid cancer [facility])    Past Surgical History:  Procedure Laterality Date  . APPENDECTOMY    . BELPHAROPTOSIS REPAIR Bilateral 2017  . BIOPSY  11/23/2019   Procedure: BIOPSY;  Surgeon: Rush Landmark Telford Nab., MD;  Location: [facility];  Service: Gastroenterology;;  . CATARACT EXTRACTION, BILATERAL Bilateral 01/10/2019   Dr.Rankin  . CHOLECYSTECTOMY  02/24/06  . ESOPHAGOGASTRODUODENOSCOPY (EGD) WITH PROPOFOL N/A 11/23/2019   Procedure: ESOPHAGOGASTRODUODENOSCOPY (EGD) WITH PROPOFOL;  Surgeon: Rush Landmark Telford Nab., MD;  Location: [facility];  Service: Gastroenterology;  Laterality: N/A;  . EXPLORATORY LAPAROTOMY     for lymphadenomopathy; appendix removed at this time  . LEFT HEART CATH AND CORONARY ANGIOGRAPHY N/A 07/20/2018   Procedure: LEFT HEART CATH AND CORONARY ANGIOGRAPHY;  Surgeon: Belva Crome, MD;  Location: [facility];  Service: Cardiovascular;  Laterality: N/A;  . THYROIDECTOMY    . TONSILLECTOMY AND ADENOIDECTOMY  1949  . TUBAL LIGATION  1972  . UPPER ESOPHAGEAL ENDOSCOPIC ULTRASOUND (EUS) N/A 11/23/2019   Procedure: UPPER ESOPHAGEAL ENDOSCOPIC ULTRASOUND (EUS);  Surgeon: Irving Copas., MD;  Location: [facility];  Service: Gastroenterology;  Laterality: N/A;  WITH BIOPSY   NEEDS 4 MINUTES  . vitamacular hole surgery Right 2020   eye   Family History  Problem Relation Age of Onset  . Stroke Mother 72  . Coronary artery disease Father        MI age 84  . Deep vein thrombosis Brother        PE at age 69  . Uterine cancer Sister   . Heart failure Sister   . Diabetes Neg Hx   . Colon cancer Neg Hx   . Breast cancer Neg Hx   . Inflammatory bowel disease Neg Hx   . Liver disease Neg Hx   . Pancreatic cancer Neg Hx   . Rectal cancer Neg Hx   . Stomach cancer Neg Hx    Social History   Socioeconomic History  . Marital status: Married    Spouse name:  Iona Beard  . Number of children: 3  . Years of education: Not on file  . Highest education level: Not on file  Occupational History  . Occupation: retired  Tobacco Use  . Smoking status: Never Smoker  . Smokeless tobacco: Never Used  Vaping Use  . Vaping Use: Never used  Substance and Sexual Activity  . Alcohol use: No    Alcohol/week: 0.0 standard drinks  . Drug use: No  . Sexual activity: Not Currently    Partners: Male    Birth control/protection: Post-menopausal  Other Topics Concern  . Not on file  Social History Narrative   3 kids, 8 GK   Lives w/ husband   Lost a g-child to suicide 2017      Social Determinants of Health   Financial Resource Strain: Low Risk   . Difficulty of Paying Living Expenses: Not hard at all  Food Insecurity: No Food Insecurity  . Worried About Charity fundraiser in the Last Year: Never true  . Ran Out of Food in the Last Year: Never true  Transportation Needs: No Transportation Needs  . Lack of Transportation (Medical): No  . Lack of Transportation (Non-Medical): No  Physical Activity:   . Days of Exercise per Week: Not on file  . Minutes of Exercise per Session: Not on file  Stress:   . Feeling of Stress : Not on file  Social Connections:   . Frequency of Communication with Friends and Family: Not on file  . Frequency of Social Gatherings with Friends and Family: Not on file  . Attends Religious Services: Not on file  . Active Member of Clubs or Organizations: Not on file  . Attends Archivist Meetings: Not on file  . Marital Status: Not on file    Tobacco Counseling Counseling given: Not Answered   Clinical Intake: Pain : No/denies pain    Activities of Daily Living In your present state of health, do you have any difficulty performing the following activities: 06/28/2020  Hearing? N  Vision? N  Difficulty concentrating or making decisions? N  Walking or climbing stairs? N  Dressing or bathing? N  Doing errands,  shopping? N  Preparing Food and eating ? N  Using the Toilet? N  In the past six months, have you accidently leaked urine? N  Do you have problems with loss of bowel control? N  Managing your Medications? N  Managing your Finances? N  Housekeeping or managing your Housekeeping? N  Some recent data might be hidden    Patient Care Team: Colon Branch, MD as PCP - Cyndia Diver, MD as PCP - Cardiology (Cardiology) Jerrell Belfast, MD as Consulting Physician (Otolaryngology) [name], Amy, MD as Consulting Physician (Dermatology) Day, Melvenia Beam, [facility] as Pharmacist (Pharmacist)  Indicate any recent Medical Services you may have received from other than [facility] providers in the past year (date may be approximate).     Assessment:   This is a routine wellness examination for Angela Clay.  Dietary issues and exercise activities discussed: Current Exercise Habits: The patient does not participate in regular exercise at present, Exercise limited by: None identified Diet (meal preparation, eat out, water intake, caffeinated beverages, dairy products, fruits and vegetables): well balanced  Goals    .  Blood pressure goal <140/90    .  Check blood pressure 2 to 3 times per week    .  Complete uric acid lab at next office visit    .  Complete Vitamin D lab at next office visit    .  maintain healthy active lifestyle (pt-stated)    .  Pharmacy Care Plan      Current Barriers:  . Chronic Disease Management support, education, and care coordination needs related to HLD, CAD, Gout, Vitamin D Deficiency, Hypertension, Hypothyroidis, Pre-Diabetes, Osteopenia  Pharmacist Clinical Goal(s):  . Blood pressure goal <140/90 . Minimal function limitation due to gout . Complete uric acid lab at next office visit . Complete Vitamin D lab at next office visit  Interventions: . Comprehensive medication review performed. . Start calcium citrate 623m once daily to supplement calcium found in  multivitamin . Start checking blood pressure 2 to 3 times per week . Record how often you have gout attacks  Patient Self Care Activities:  . Patient verbalizes understanding of plan to follow as described above, Self administers medications as prescribed, Calls pharmacy  for medication refills, and Calls provider office for new concerns or questions  Initial goal documentation     .  Record how often you have gout attacks      -If you are often bothered by gout, we can consider starting allopurinol daily for prevention of gout attacks    .  Start taking Calcium Citrate 654m once daily      This will help you get the appropriate amount of calcium in addition to the calcium provided in your multivitamin      Depression Screen PHQ 2/9 Scores 06/28/2020 05/31/2019 05/25/2018 05/11/2017 07/07/2016 01/10/2016 07/03/2015  PHQ - 2 Score 0 0 0 0 0 0 0    Fall Risk Fall Risk  06/28/2020 05/31/2019 05/25/2018 05/11/2017 07/07/2016  Falls in the past year? 0 0 No No No  Number falls in past yr: 0 - - - -  Injury with Fall? 0 - - - -  Follow up Education provided;Falls prevention discussed - - - -    Any stairs in or around the home? No  If so, are there any without handrails? No  Home free of loose throw rugs in walkways, pet beds, electrical cords, etc? Yes  Adequate lighting in your home to reduce risk of falls? Yes   ASSISTIVE DEVICES UTILIZED TO PREVENT FALLS:  Life alert? No  Use of a cane, walker or w/c? No  Grab bars in the bathroom? No  Shower chair or bench in shower? No  Elevated toilet seat or a handicapped toilet? No     Cognitive Function: Ad8 score reviewed for issues:  Issues making decisions:no  Less interest in hobbies / activities:no  Repeats questions, stories (family complaining):no  Trouble using ordinary gadgets (microwave, computer, phone):no  Forgets the month or year: no  Mismanaging finances: no  Remembering appts:no Daily problems with thinking and/or  memory:no Ad8 score is=0     MMSE - Mini Mental State Exam 05/11/2017  Orientation to time 5  Orientation to Place 5  Registration 3  Attention/ Calculation 5  Recall 2  Language- name 2 objects 2  Language- repeat 1  Language- follow 3 step command 2  Language- read & follow direction 1  Write a sentence 1  Copy design 1  Total score 28        Immunizations Immunization History  Administered Date(s) Administered  . Influenza Split 10/01/2011  . Influenza Whole 08/25/2007, 09/08/2008, 08/03/2009, 08/20/2010  . Influenza, High Dose Seasonal PF 07/03/2015, 07/07/2016, 07/16/2017, 07/21/2018, 08/04/2019  . Influenza, Seasonal, Injecte, Preservative Fre 10/11/2012  . Influenza,inj,Quad PF,6+ Mos 10/18/2013, 09/19/2014  . Moderna SARS-COVID-2 Vaccination 11/08/2019, 12/05/2019  . Pneumococcal Conjugate-13 01/02/2014  . Pneumococcal Polysaccharide-23 10/27/2000, 08/25/2007, 01/15/2018  . Td 10/27/1997, 08/30/2008  . Tdap 08/04/2019  . Zoster 03/04/2013    TDAP status: Up to date Flu Vaccine status: Up to date Pneumococcal vaccine status: Up to date Covid-19 vaccine status: Completed vaccines  Qualifies for Shingles Vaccine? Yes   Zostavax completed Yes     Screening Tests Health Maintenance  Topic Date Due  . INFLUENZA VACCINE  05/27/2020  . TETANUS/TDAP  08/03/2029  . DEXA SCAN  Completed  . COVID-19 Vaccine  Completed  . PNA vac Low Risk Adult  Completed    Health Maintenance  Health Maintenance Due  Topic Date Due  . INFLUENZA VACCINE  05/27/2020    Colorectal cancer screening: No longer required.  Mammogram status: Completed 12/02/19. Repeat every year Bone Density status: Completed 05/14/17. Results  reflect: Bone density results: OSTEOPENIA. Repeat every 2 years.  Lung Cancer Screening: (Low Dose CT Chest recommended if Age 40-80 years, 30 pack-year currently smoking OR have quit w/in 15years.) does not qualify.   Lung Cancer Screening Referral:  na  Additional Screening:  Hepatitis C Screening: does qualify; Completed 06/02/19  Vision Screening: Recommended annual ophthalmology exams for early detection of glaucoma and other disorders of the eye. Is the patient up to date with their annual eye exam?  Yes  Who is the provider or what is the name of the office in which the patient attends annual eye exams? Dr.Lyles   Dental Screening: Recommended annual dental exams for proper oral hygiene  Community Resource Referral / Chronic Care Management: CRR required this visit?  No   CCM required this visit?  No      Plan:    Continue to eat heart healthy diet (full of fruits, vegetables, whole grains, lean protein, water--limit salt, fat, and sugar intake) and increase physical activity as tolerated.  Continue doing brain stimulating activities (puzzles, reading, adult coloring books, staying active) to keep memory sharp.   I have personally reviewed and noted the following in the patient's chart:   . Medical and social history . Use of alcohol, tobacco or illicit drugs  . Current medications and supplements . Functional ability and status . Nutritional status . Physical activity . Advanced directives . List of other physicians . Hospitalizations, surgeries, and ER visits in previous 12 months . Vitals . Screenings to include cognitive, depression, and falls . Referrals and appointments  In addition, I have reviewed and discussed with patient certain preventive protocols, quality metrics, and best practice recommendations. A written personalized care plan for preventive services as well as general preventive health recommendations were provided to patient.   Due to this being a telephonic visit, the after visit summary with patients personalized plan was offered to patient via mail or my-chart. Patient would like to access on my-chart.  Naaman Plummer [place], [place]   06/28/2020

## 2020-06-28 ENCOUNTER — Encounter: Payer: Self-pay | Admitting: *Deleted

## 2020-06-28 ENCOUNTER — Ambulatory Visit (INDEPENDENT_AMBULATORY_CARE_PROVIDER_SITE_OTHER): Payer: PPO | Admitting: *Deleted

## 2020-06-28 ENCOUNTER — Other Ambulatory Visit: Payer: Self-pay

## 2020-06-28 VITALS — BP 116/73 | HR 74 | Wt 145.0 lb

## 2020-06-28 DIAGNOSIS — Z0000 Encounter for general adult medical examination without abnormal findings: Secondary | ICD-10-CM | POA: Diagnosis not present

## 2020-06-28 NOTE — Patient Instructions (Signed)
Continue to eat heart healthy diet (full of fruits, vegetables, whole grains, lean protein, water--limit salt, fat, and sugar intake) and increase physical activity as tolerated.  Continue doing brain stimulating activities (puzzles, reading, adult coloring books, staying active) to keep memory sharp.    Angela Clay , Thank you for taking time to come for your Medicare Wellness Visit. I appreciate your ongoing commitment to your health goals. Please review the following plan we discussed and let me know if I can assist you in the future.   These are the goals we discussed: Goals    .  Blood pressure goal <140/90    .  Check blood pressure 2 to 3 times per week    .  Complete uric acid lab at next office visit    .  Complete Vitamin D lab at next office visit    .  maintain healthy active lifestyle (pt-stated)    .  Pharmacy Care Plan      Current Barriers:  . Chronic Disease Management support, education, and care coordination needs related to HLD, CAD, Gout, Vitamin D Deficiency, Hypertension, Hypothyroidis, Pre-Diabetes, Osteopenia  Pharmacist Clinical Goal(s):  . Blood pressure goal <140/90 . Minimal function limitation due to gout . Complete uric acid lab at next office visit . Complete Vitamin D lab at next office visit  Interventions: . Comprehensive medication review performed. . Start calcium citrate 653m once daily to supplement calcium found in multivitamin . Start checking blood pressure 2 to 3 times per week . Record how often you have gout attacks  Patient Self Care Activities:  . Patient verbalizes understanding of plan to follow as described above, Self administers medications as prescribed, Calls pharmacy for medication refills, and Calls provider office for new concerns or questions  Initial goal documentation     .  Record how often you have gout attacks      -If you are often bothered by gout, we can consider starting allopurinol daily for prevention of  gout attacks    .  Start taking Calcium Citrate 6026monce daily      This will help you get the appropriate amount of calcium in addition to the calcium provided in your multivitamin       This is a list of the screening recommended for you and due dates:  Health Maintenance  Topic Date Due  . Flu Shot  05/27/2020  . Tetanus Vaccine  08/03/2029  . DEXA scan (bone density measurement)  Completed  . COVID-19 Vaccine  Completed  . Pneumonia vaccines  Completed    Preventive Care 6551ears and Older, Female Preventive care refers to lifestyle choices and visits with your health care provider that can promote health and wellness. This includes:  A yearly physical exam. This is also called an annual well check.  Regular dental and eye exams.  Immunizations.  Screening for certain conditions.  Healthy lifestyle choices, such as diet and exercise. What can I expect for my preventive care visit? Physical exam Your health care provider will check:  Height and weight. These may be used to calculate body mass index (BMI), which is a measurement that tells if you are at a healthy weight.  Heart rate and blood pressure.  Your skin for abnormal spots. Counseling Your health care provider may ask you questions about:  Alcohol, tobacco, and drug use.  Emotional well-being.  Home and relationship well-being.  Sexual activity.  Eating habits.  History of falls.  Memory and  ability to understand (cognition).  Work and work Statistician.  Pregnancy and menstrual history. What immunizations do I need?  Influenza (flu) vaccine  This is recommended every year. Tetanus, diphtheria, and pertussis (Tdap) vaccine  You may need a Td booster every 10 years. Varicella (chickenpox) vaccine  You may need this vaccine if you have not already been vaccinated. Zoster (shingles) vaccine  You may need this after age 54. Pneumococcal conjugate (PCV13) vaccine  One dose is  recommended after age 66. Pneumococcal polysaccharide (PPSV23) vaccine  One dose is recommended after age 70. Measles, mumps, and rubella (MMR) vaccine  You may need at least one dose of MMR if you were born in 1957 or later. You may also need a second dose. Meningococcal conjugate (MenACWY) vaccine  You may need this if you have certain conditions. Hepatitis A vaccine  You may need this if you have certain conditions or if you travel or work in places where you may be exposed to hepatitis A. Hepatitis B vaccine  You may need this if you have certain conditions or if you travel or work in places where you may be exposed to hepatitis B. Haemophilus influenzae type b (Hib) vaccine  You may need this if you have certain conditions. You may receive vaccines as individual doses or as more than one vaccine together in one shot (combination vaccines). Talk with your health care provider about the risks and benefits of combination vaccines. What tests do I need? Blood tests  Lipid and cholesterol levels. These may be checked every 5 years, or more frequently depending on your overall health.  Hepatitis C test.  Hepatitis B test. Screening  Lung cancer screening. You may have this screening every year starting at age 91 if you have a 30-pack-year history of smoking and currently smoke or have quit within the past 15 years.  Colorectal cancer screening. All adults should have this screening starting at age 57 and continuing until age 72. Your health care provider may recommend screening at age 51 if you are at increased risk. You will have tests every 1-10 years, depending on your results and the type of screening test.  Diabetes screening. This is done by checking your blood sugar (glucose) after you have not eaten for a while (fasting). You may have this done every 1-3 years.  Mammogram. This may be done every 1-2 years. Talk with your health care provider about how often you should have  regular mammograms.  BRCA-related cancer screening. This may be done if you have a family history of breast, ovarian, tubal, or peritoneal cancers. Other tests  Sexually transmitted disease (STD) testing.  Bone density scan. This is done to screen for osteoporosis. You may have this done starting at age 75. Follow these instructions at home: Eating and drinking  Eat a diet that includes fresh fruits and vegetables, whole grains, lean protein, and low-fat dairy products. Limit your intake of foods with high amounts of sugar, saturated fats, and salt.  Take vitamin and mineral supplements as recommended by your health care provider.  Do not drink alcohol if your health care provider tells you not to drink.  If you drink alcohol: ? Limit how much you have to 0-1 drink a day. ? Be aware of how much alcohol is in your drink. In the [place] one drink equals one 12 oz bottle of beer (355 mL), one 5 oz glass of wine (148 mL), or one 1 oz glass of hard liquor (44 mL).  Lifestyle  Take daily care of your teeth and gums.  Stay active. Exercise for at least 30 minutes on 5 or more days each week.  Do not use any products that contain nicotine or tobacco, such as cigarettes, e-cigarettes, and chewing tobacco. If you need help quitting, ask your health care provider.  If you are sexually active, practice safe sex. Use a condom or other form of protection in order to prevent STIs (sexually transmitted infections).  Talk with your health care provider about taking a low-dose aspirin or statin. What's next?  Go to your health care provider once a year for a well check visit.  Ask your health care provider how often you should have your eyes and teeth checked.  Stay up to date on all vaccines. This information is not intended to replace advice given to you by your health care provider. Make sure you discuss any questions you have with your health care provider. Document Revised: 10/07/2018  Document Reviewed: 10/07/2018 Elsevier Patient Education  2020 [employer].

## 2020-07-31 ENCOUNTER — Other Ambulatory Visit: Payer: Self-pay | Admitting: Internal Medicine

## 2020-08-06 ENCOUNTER — Telehealth: Payer: Self-pay | Admitting: Gastroenterology

## 2020-08-06 DIAGNOSIS — K754 Autoimmune hepatitis: Secondary | ICD-10-CM

## 2020-08-06 NOTE — Telephone Encounter (Signed)
FYI - Pt called- requesting to have labs drawn at our office for Dr. Edison Pace. Order for HFP entered in [place]. Pt will come on tomorrow 08/07/20 to have lab drawn.

## 2020-08-06 NOTE — Telephone Encounter (Signed)
Pt is requesting a call back from [place] to advise her what blood work is Dr Edison Pace requesting for [place] to do for the pt.

## 2020-08-07 ENCOUNTER — Other Ambulatory Visit (INDEPENDENT_AMBULATORY_CARE_PROVIDER_SITE_OTHER): Payer: PPO

## 2020-08-07 DIAGNOSIS — K754 Autoimmune hepatitis: Secondary | ICD-10-CM

## 2020-08-07 LAB — HEPATIC FUNCTION PANEL
ALT: 22 U/L (ref 0–35)
AST: 23 U/L (ref 0–37)
Albumin: 3.9 g/dL (ref 3.5–5.2)
Alkaline Phosphatase: 100 U/L (ref 39–117)
Bilirubin, Direct: 0.2 mg/dL (ref 0.0–0.3)
Total Bilirubin: 0.7 mg/dL (ref 0.2–1.2)
Total Protein: 7.7 g/dL (ref 6.0–8.3)

## 2020-08-07 NOTE — Telephone Encounter (Signed)
Thank you. GM

## 2020-08-27 ENCOUNTER — Other Ambulatory Visit (HOSPITAL_BASED_OUTPATIENT_CLINIC_OR_DEPARTMENT_OTHER): Payer: Self-pay | Admitting: Internal Medicine

## 2020-08-27 ENCOUNTER — Ambulatory Visit: Payer: PPO | Attending: Internal Medicine

## 2020-08-27 DIAGNOSIS — Z23 Encounter for immunization: Secondary | ICD-10-CM

## 2020-08-27 NOTE — Progress Notes (Signed)
   Covid-19 Vaccination Clinic  Name:  TASHAUNA CAISSE    MRN: [id number] DOB: August 07, 1934  08/27/2020  Ms. Cavitt was observed post Covid-19 immunization for 15 minutes without incident. She was provided with Vaccine Information Sheet and instruction to access the V-Safe system.   Ms. Avakian was instructed to call 911 with any severe reactions post vaccine: Marland Kitchen Difficulty breathing  . Swelling of face and throat  . A fast heartbeat  . A bad rash all over body  . Dizziness and weakness

## 2020-09-05 MED FILL — MODERNA COVID-19 VACCINE 10: 100 | 1 days supply | Qty: 0 | Fill #0

## 2020-09-14 ENCOUNTER — Other Ambulatory Visit: Payer: Self-pay | Admitting: Internal Medicine

## 2020-09-18 ENCOUNTER — Other Ambulatory Visit: Payer: Self-pay | Admitting: Internal Medicine

## 2020-10-16 ENCOUNTER — Other Ambulatory Visit: Payer: Self-pay | Admitting: Internal Medicine

## 2020-11-02 ENCOUNTER — Other Ambulatory Visit: Payer: Self-pay | Admitting: Internal Medicine

## 2020-11-02 ENCOUNTER — Encounter: Payer: Self-pay | Admitting: Internal Medicine

## 2020-11-21 ENCOUNTER — Telehealth: Payer: Self-pay | Admitting: Pharmacist

## 2020-11-21 NOTE — Progress Notes (Addendum)
Chronic Care Management Pharmacy Assistant   Name: Angela Clay  MRN: [id number] DOB: 11-10-1933  Reason for Encounter: Disease State for CHL.  Patient Questions:  1.  Have you seen any other providers since your last visit? Yes.   2.  Any changes in your medicines or health? Yes.   PCP : Colon Branch, MD   Their chronic conditions include: HLD, CAD, Gout, Vitamin D Deficiency, Hypertension, Hypothyroidis, Pre-Diabetes, Osteopenia.  Office Visits: None since 06/21/20  Consults: 08/07/20 Angela Clay, Angela Clay. Blood drawn. 06/28/20 Clinical Support Angela Bast, RN STOPPED Methylcellulose.  06/22/20 Ophthalmology. Angela Clay. No information given.  Allergies:   Allergies  Allergen Reactions   Codeine Nausea And Vomiting    Medications: Outpatient Encounter Medications as of 11/21/2020  Medication Sig   acetaminophen (TYLENOL) 500 MG tablet Take 500 mg by mouth every 6 (six) hours as needed for mild pain.    aspirin 81 MG tablet Take 81 mg by mouth at bedtime.    atorvastatin (LIPITOR) 40 MG tablet Take 1 tablet (40 mg total) by mouth at bedtime.   Calcium Carb-Cholecalciferol (CALCIUM 600 + D PO) Take 1 tablet by mouth daily.   Cholecalciferol (VITAMIN D3) 25 MCG (1000 UT) CAPS Take 1,000 Units by mouth daily.    Colchicine (MITIGARE) 0.6 MG CAPS Take 1 capsule by mouth 2 (two) times daily as needed. (Patient taking differently: Take 0.6 mg by mouth 2 (two) times daily as needed (gout flare). )   hydrochlorothiazide (HYDRODIURIL) 25 MG tablet Take 1 tablet (25 mg total) by mouth daily.   levothyroxine (EUTHYROX) 112 MCG tablet Take 1 tablet (112 mcg total) by mouth daily before breakfast.   Multiple Vitamins-Minerals (CENTRUM SILVER 50+WOMEN) TABS Take 1 tablet by mouth daily.    omeprazole (PRILOSEC) 20 MG capsule Take 20 mg by mouth daily as needed (acid reflux).    Polyethyl Glycol-Propyl Glycol (SYSTANE ULTRA OP) Place 1 drop into both eyes daily as needed  (Dry eyes).   Vitamin D, Ergocalciferol, (DRISDOL) 1.25 MG (50000 UNIT) CAPS capsule Take 1 capsule (50,000 Units total) by mouth every 7 (seven) days.   No facility-administered encounter medications on file as of 11/21/2020.    Current Diagnosis: Patient Active Problem List   Diagnosis Date Noted   Autoimmune hepatitis ([place]) 04/01/2020   Candida esophagitis ([place]) 12/31/2019   Chronic gastritis without bleeding 12/31/2019   Elevated alkaline phosphatase level 10/14/2019   Pancreatic cyst 10/14/2019   Dilated bile duct 10/14/2019   Positive antismooth muscle antibody  08/08/2019   Abnormal ultrasound of liver 07/07/2019   Abnormal LFTs 07/05/2019   Abnormal SPEP 06/22/2019   Pericarditis 08/09/2018   ACS (acute coronary syndrome) ([place]) 07/19/2018   Shingles 12/04/2015   Follow-up-----------------PCP notes  07/03/2015   Prolapse of female pelvic organs 03/11/2013   Dystrophy, vulva-- s/p bx, on clobetasol prn 03/11/2013   Monoarthritis of hand 03/10/2013   Hyperglycemia 12/07/2012   Annual physical exam 02/19/2011   Carotid arterial disease ([place]) 02/19/2011   DEGENERATIVE JOINT DISEASE 04/26/2010   Hypothyroidism 01/11/2008   LYMPHOMA NEC, MLIG, UNSPECIFIED SITE 08/25/2007   Hyperlipidemia 08/25/2007   Essential hypertension 08/25/2007   Gastroesophageal reflux disease 08/25/2007   Osteopenia 08/25/2007   THYROIDECTOMY, HX OF 08/25/2007    Goals Addressed   None    11/21/2020 Name: Angela Clay MRN: [id number] DOB: 1934-09-13 Angela Clay is a 87 y.o. year old female who is a primary care patient of Angela November  E, MD.  Comprehensive medication review performed; Spoke to patient regarding cholesterol  Lipid Panel    Component Value Date/Time   CHOL 133 05/10/2019 1012   TRIG 143.0 05/10/2019 1012   HDL 40.70 05/10/2019 1012   LDLCALC 64 05/10/2019 1012   LDLDIRECT 118.5 08/25/2007 0000    10-year ASCVD risk score: The ASCVD Risk score Angela Clay DC Jr., et al., 2013)  failed to calculate for the following reasons:   The 2013 ASCVD risk score is only valid for ages 71 to 30  Current antihyperlipidemic regimen:  Atorvastatin 40 mg  Previous antihyperlipidemic medications tried: None  ASCVD risk enhancing conditions: age >37 and HTN   What recent interventions/DTPs have been made by any provider to improve Cholesterol control since last CPP Visit: None.  Any recent hospitalizations or ED visits since last visit with CPP? No.  What diet changes have been made to improve Cholesterol?  Patient stated she has very good diet. She stays away from fired foods, she stated she eats the healthier snacks. Patient stated she's doesn't eat a lot of beef or fish. She does eat tuna for lunch.  What exercise is being done to improve Cholesterol?  Patient stated she stated she hasn't been to the gym since [place]. She stated she does the household choices with the help of her husband.   Adherence Review: Does the patient have >5 day gap between last estimated fill dates? No, CPP Please Check.  Patient did not have any concerns about her medication at this time.    Follow-Up:  Pharmacist Review   Angela Clay, RMA Clinical Pharmacist Assistant [phone]  4 minutes spent in review, coordination, and documentation.  Reviewed by: Angela Clay, PharmD Clinical Pharmacist [facility] [phone]

## 2020-11-22 ENCOUNTER — Ambulatory Visit (INDEPENDENT_AMBULATORY_CARE_PROVIDER_SITE_OTHER): Payer: Medicare HMO | Admitting: Internal Medicine

## 2020-11-22 ENCOUNTER — Other Ambulatory Visit: Payer: Self-pay

## 2020-11-22 ENCOUNTER — Encounter: Payer: Self-pay | Admitting: Internal Medicine

## 2020-11-22 VITALS — BP 146/72 | HR 67 | Temp 97.8°F | Resp 16 | Ht 63.0 in | Wt 153.0 lb

## 2020-11-22 DIAGNOSIS — I10 Essential (primary) hypertension: Secondary | ICD-10-CM | POA: Diagnosis not present

## 2020-11-22 DIAGNOSIS — E785 Hyperlipidemia, unspecified: Secondary | ICD-10-CM | POA: Diagnosis not present

## 2020-11-22 DIAGNOSIS — E119 Type 2 diabetes mellitus without complications: Secondary | ICD-10-CM

## 2020-11-22 DIAGNOSIS — E559 Vitamin D deficiency, unspecified: Secondary | ICD-10-CM | POA: Diagnosis not present

## 2020-11-22 DIAGNOSIS — E039 Hypothyroidism, unspecified: Secondary | ICD-10-CM

## 2020-11-22 LAB — BASIC METABOLIC PANEL
BUN: 13 mg/dL (ref 6–23)
CO2: 37 mEq/L — ABNORMAL HIGH (ref 19–32)
Calcium: 10.7 mg/dL — ABNORMAL HIGH (ref 8.4–10.5)
Chloride: 100 mEq/L (ref 96–112)
Creatinine, Ser: 0.84 mg/dL (ref 0.40–1.20)
GFR: 63.56 mL/min (ref >60.00)
Glucose, Bld: 105 mg/dL — ABNORMAL HIGH (ref 70–99)
Potassium: 4.9 mEq/L (ref 3.5–5.1)
Sodium: 140 mEq/L (ref 135–145)

## 2020-11-22 LAB — LIPID PANEL
Cholesterol: 144 mg/dL (ref 0–200)
HDL: 56.6 mg/dL (ref >39.00)
LDL Cholesterol: 59 mg/dL (ref 0–99)
NonHDL: 87.1
Total CHOL/HDL Ratio: 3
Triglycerides: 143 mg/dL (ref 0.0–149.0)
VLDL: 28.6 mg/dL (ref 0.0–40.0)

## 2020-11-22 LAB — CBC WITH DIFFERENTIAL/PLATELET
Basophils Absolute: 0 10*3/uL (ref 0.0–0.1)
Basophils Relative: 0.7 % (ref 0.0–3.0)
Eosinophils Absolute: 0.2 10*3/uL (ref 0.0–0.7)
Eosinophils Relative: 3.1 % (ref 0.0–5.0)
HCT: 41.2 % (ref 36.0–46.0)
Hemoglobin: 13.6 g/dL (ref 12.0–15.0)
Lymphocytes Relative: 22.2 % (ref 12.0–46.0)
Lymphs Abs: 1.5 10*3/uL (ref 0.7–4.0)
MCHC: 33.1 g/dL (ref 30.0–36.0)
MCV: 87.4 fl (ref 78.0–100.0)
Monocytes Absolute: 0.4 10*3/uL (ref 0.1–1.0)
Monocytes Relative: 6.4 % (ref 3.0–12.0)
Neutro Abs: 4.6 10*3/uL (ref 1.4–7.7)
Neutrophils Relative %: 67.6 % (ref 43.0–77.0)
Platelets: 207 10*3/uL (ref 150.0–400.0)
RBC: 4.72 Mil/uL (ref 3.87–5.11)
RDW: 15.2 % (ref 11.5–15.5)
WBC: 6.8 10*3/uL (ref 4.0–10.5)

## 2020-11-22 LAB — TSH: TSH: 0.43 u[IU]/mL (ref 0.35–4.50)

## 2020-11-22 LAB — VITAMIN D 25 HYDROXY (VIT D DEFICIENCY, FRACTURES): VITD: 29.08 ng/mL — ABNORMAL LOW (ref 30.00–100.00)

## 2020-11-22 LAB — HEMOGLOBIN A1C: Hgb A1c MFr Bld: 6 % (ref 4.6–6.5)

## 2020-11-22 NOTE — Progress Notes (Signed)
Subjective:    Patient ID: Angela Clay, female    DOB: 10-Jun-1934, 87 y.o.   MRN: [id number]  DOS:  11/22/2020 Type of visit - description: Follow-up  Since the last office visit was diagnosed with autoimmune hepatitis. Fortunately she is doing well. All chronic medical issues discussed.  Review of Systems Denies chest pain or difficulty breathing No nausea, vomiting, diarrhea  Past Medical History:  Diagnosis Date  . Actinic keratosis 05/2015   Right mid central dorsal hand  . Gallstones   . GERD (gastroesophageal reflux disease)    EGD for dysphagia 2000 aprox (-)  . History of radiation therapy   . Hyperlipidemia   . Hypertension   . Hypothyroidism   . Lymphoma ([place])    neck,mlig,unspecified site dx 1970s  . OA (osteoarthritis)    severe at neck  . Osteopenia   . Syncope 11-2012   admited . w/u (-)  . Thyroid cancer [facility])     Past Surgical History:  Procedure Laterality Date  . APPENDECTOMY    . BELPHAROPTOSIS REPAIR Bilateral 2017  . BIOPSY  11/23/2019   Procedure: BIOPSY;  Surgeon: Rush Landmark Telford Nab., MD;  Location: [facility];  Service: Gastroenterology;;  . CATARACT EXTRACTION, BILATERAL Bilateral 01/10/2019   Dr.Rankin  . CHOLECYSTECTOMY  02/24/06  . ESOPHAGOGASTRODUODENOSCOPY (EGD) WITH PROPOFOL N/A 11/23/2019   Procedure: ESOPHAGOGASTRODUODENOSCOPY (EGD) WITH PROPOFOL;  Surgeon: Rush Landmark Telford Nab., MD;  Location: [facility];  Service: Gastroenterology;  Laterality: N/A;  . EXPLORATORY LAPAROTOMY     for lymphadenomopathy; appendix removed at this time  . LEFT HEART CATH AND CORONARY ANGIOGRAPHY N/A 07/20/2018   Procedure: LEFT HEART CATH AND CORONARY ANGIOGRAPHY;  Surgeon: Belva Crome, MD;  Location: [place] CV LAB;  Service: Cardiovascular;  Laterality: N/A;  . THYROIDECTOMY    . TONSILLECTOMY AND ADENOIDECTOMY  1949  . TUBAL LIGATION  1972  . UPPER ESOPHAGEAL ENDOSCOPIC ULTRASOUND (EUS) N/A 11/23/2019   Procedure: UPPER ESOPHAGEAL  ENDOSCOPIC ULTRASOUND (EUS);  Surgeon: Irving Copas., MD;  Location: [facility];  Service: Gastroenterology;  Laterality: N/A;  WITH BIOPSY   NEEDS 90 MINUTES  . vitamacular hole surgery Right 2020   eye    Allergies as of 11/22/2020      Reactions   Codeine Nausea And Vomiting      Medication List       Accurate as of November 22, 2020 11:59 PM. If you have any questions, ask your nurse or doctor.        STOP taking these medications   Colchicine 0.6 MG Caps Commonly known as: Mitigare Stopped by: [name], MD   Vitamin D (Ergocalciferol) 1.25 MG (50000 UNIT) Caps capsule Commonly known as: DRISDOL Stopped by: [name], MD   Vitamin D3 25 MCG (1000 UT) Caps Stopped by: [name], MD     TAKE these medications   acetaminophen 500 MG tablet Commonly known as: TYLENOL Take 500 mg by mouth every 6 (six) hours as needed for mild pain.   aspirin 81 MG tablet Take 81 mg by mouth at bedtime.   atorvastatin 40 MG tablet Commonly known as: LIPITOR Take 1 tablet (40 mg total) by mouth at bedtime.   CALCIUM 600 + D PO Take 1 tablet by mouth daily.   Centrum Silver 50+Women Tabs Take 1 tablet by mouth daily.   hydrochlorothiazide 25 MG tablet Commonly known as: HYDRODIURIL Take 1 tablet (25 mg total) by mouth daily.   levothyroxine 112 MCG tablet  Commonly known as: Euthyrox Take 1 tablet (112 mcg total) by mouth daily before breakfast.   omeprazole 20 MG capsule Commonly known as: PRILOSEC Take 20 mg by mouth daily as needed (acid reflux).   SYSTANE ULTRA OP Place 1 drop into both eyes daily as needed (Dry eyes).          Objective:   Physical Exam BP (!) 146/72 (BP Location: Left Arm, Patient Position: Sitting, Cuff Size: Small)   Pulse 67   Temp 97.8 F (36.6 C) (Oral)   Resp 16   Ht 5' 3"  (1.6 m)   Wt 153 lb (69.4 kg)   SpO2 97%   BMI 27.10 kg/m  General:   Well developed, NAD, BMI noted.  HEENT:  Normocephalic . Face symmetric,  atraumatic Lungs:  CTA B Normal respiratory effort, no intercostal retractions, no accessory muscle use. Heart: RRR,  no murmur.  Abdomen:  Not distended, soft, non-tender. No rebound or rigidity.   Skin: Not pale. Not jaundice Lower extremities: no pretibial edema bilaterally  Neurologic:  alert & oriented X3.  Speech normal, gait appropriate for age and unassisted Psych--  Cognition and judgment appear intact.  Cooperative with normal attention span and concentration.  Behavior appropriate. No anxious or depressed appearing.     Assessment     Assessment DM A1c 6.8 (04-2019) Hyperlipidemia HTN Hypothyroidism Carotid artery disease [place] 11-2014: 60-79% RICA stenosis. 93-79% LICA stenosis [place] 0/2409: 1-39 % B. 1 year [place] 06/2017 1-39%, no further routine [place] DJD, severe at the neck GERD, dysphagia remotely Osteopenia: -s/p fosamax 2007 to 2012. -T score -1.7 (01-2015)  -T score -1.8 (04-2017) rx ca- vitamin D Lymphoma,  Neck, 1970s Syncope, admitted-2014, workup negative Shingles: 10/02/2016, left arm Chest pain: Cath 07/19/2018, negative, pericarditis?  PLAN: DM: Diet controlled, check A1c Hyperlipidemia: On atorvastatin, check FLP HTN: On HCTZ, checking labs. Thyroid disease: On Synthroid, labs. Autoimmune hepatitis: Since the last visit, was diagnosed with autoimmune hepatitis, has been seen at [facility], current Rx observation, will see them again in June  Vitamin D deficiency: Had ergocalciferol, currently taking only multivitamin and a B12 supplement.  Will check levels and also an ionized calcium (last calcium was slightly high). Preventive care: COVID vaccine x3 Had a flu shot Shingrix: Okay to proceed when she is ready. RTC 4 months    This visit occurred during the SARS-CoV-2 public health emergency.  Safety protocols were in place, including screening questions prior to the visit, additional usage of staff PPE, and extensive cleaning of exam room while observing  appropriate contact time as indicated for disinfecting solutions.

## 2020-11-22 NOTE — Progress Notes (Unsigned)
Pre visit review using our clinic review tool, if applicable. No additional management support is needed unless otherwise documented below in the visit note. 

## 2020-11-22 NOTE — Patient Instructions (Signed)
  GO TO THE LAB : Get the blood work     GO TO THE FRONT DESK, PLEASE SCHEDULE YOUR APPOINTMENTS Come back for physical exam in 4 months

## 2020-11-23 LAB — CALCIUM, IONIZED: Calcium, Ion: 5.46 mg/dL (ref 4.8–5.6)

## 2020-11-23 NOTE — Assessment & Plan Note (Signed)
DM: Diet controlled, check A1c Hyperlipidemia: On atorvastatin, check FLP HTN: On HCTZ, checking labs. Thyroid disease: On Synthroid, labs. Autoimmune hepatitis: Since the last visit, was diagnosed with autoimmune hepatitis, has been seen at [facility], current Rx observation, will see them again in June  Vitamin D deficiency: Had ergocalciferol, currently taking only multivitamin and a B12 supplement.  Will check levels and also an ionized calcium (last calcium was slightly high). Preventive care: COVID vaccine x3 Had a flu shot Shingrix: Okay to proceed when she is ready. RTC 4 months

## 2020-11-26 ENCOUNTER — Encounter: Payer: Self-pay | Admitting: Internal Medicine

## 2020-11-27 MED ORDER — VITAMIN D (ERGOCALCIFEROL) 1.25 MG (50000 UNIT) PO CAPS
50000.0000 [IU] | ORAL_CAPSULE | ORAL | 0 refills | Status: DC
Start: 2020-11-27 — End: 2021-05-24

## 2020-11-27 NOTE — Addendum Note (Signed)
Addended byDamita Dunnings D on: 11/27/2020 08:06 AM   Modules accepted: Orders

## 2020-12-04 ENCOUNTER — Other Ambulatory Visit: Payer: Self-pay | Admitting: Internal Medicine

## 2020-12-07 DIAGNOSIS — Z1231 Encounter for screening mammogram for malignant neoplasm of breast: Secondary | ICD-10-CM | POA: Diagnosis not present

## 2020-12-07 LAB — HM MAMMOGRAPHY

## 2020-12-12 DIAGNOSIS — Z0100 Encounter for examination of eyes and vision without abnormal findings: Secondary | ICD-10-CM | POA: Diagnosis not present

## 2020-12-21 DIAGNOSIS — E039 Hypothyroidism, unspecified: Secondary | ICD-10-CM | POA: Diagnosis not present

## 2020-12-21 DIAGNOSIS — E785 Hyperlipidemia, unspecified: Secondary | ICD-10-CM | POA: Diagnosis not present

## 2020-12-21 DIAGNOSIS — C8590 Non-Hodgkin lymphoma, unspecified, unspecified site: Secondary | ICD-10-CM | POA: Diagnosis not present

## 2020-12-21 DIAGNOSIS — I10 Essential (primary) hypertension: Secondary | ICD-10-CM | POA: Diagnosis not present

## 2020-12-21 DIAGNOSIS — Z008 Encounter for other general examination: Secondary | ICD-10-CM | POA: Diagnosis not present

## 2020-12-21 DIAGNOSIS — M1990 Unspecified osteoarthritis, unspecified site: Secondary | ICD-10-CM | POA: Diagnosis not present

## 2021-01-02 ENCOUNTER — Encounter: Payer: Self-pay | Admitting: Internal Medicine

## 2021-01-26 ENCOUNTER — Other Ambulatory Visit: Payer: Self-pay | Admitting: Internal Medicine

## 2021-02-25 ENCOUNTER — Ambulatory Visit (INDEPENDENT_AMBULATORY_CARE_PROVIDER_SITE_OTHER): Payer: Medicare HMO | Admitting: Internal Medicine

## 2021-02-25 ENCOUNTER — Encounter: Payer: Self-pay | Admitting: Internal Medicine

## 2021-02-25 ENCOUNTER — Other Ambulatory Visit: Payer: Self-pay

## 2021-02-25 VITALS — BP 137/79 | HR 77 | Temp 97.0°F | Ht 63.0 in | Wt 155.8 lb

## 2021-02-25 DIAGNOSIS — Z0000 Encounter for general adult medical examination without abnormal findings: Secondary | ICD-10-CM

## 2021-02-25 DIAGNOSIS — E559 Vitamin D deficiency, unspecified: Secondary | ICD-10-CM | POA: Diagnosis not present

## 2021-02-25 DIAGNOSIS — E039 Hypothyroidism, unspecified: Secondary | ICD-10-CM | POA: Diagnosis not present

## 2021-02-25 DIAGNOSIS — I10 Essential (primary) hypertension: Secondary | ICD-10-CM

## 2021-02-25 LAB — COMPREHENSIVE METABOLIC PANEL
ALT: 19 U/L (ref 0–35)
AST: 16 U/L (ref 0–37)
Albumin: 4 g/dL (ref 3.5–5.2)
Alkaline Phosphatase: 82 U/L (ref 39–117)
BUN: 12 mg/dL (ref 6–23)
CO2: 29 mEq/L (ref 19–32)
Calcium: 9.5 mg/dL (ref 8.4–10.5)
Chloride: 100 mEq/L (ref 96–112)
Creatinine, Ser: 0.8 mg/dL (ref 0.40–1.20)
GFR: 67.27 mL/min (ref >60.00)
Glucose, Bld: 168 mg/dL — ABNORMAL HIGH (ref 70–99)
Potassium: 3.7 mEq/L (ref 3.5–5.1)
Sodium: 138 mEq/L (ref 135–145)
Total Bilirubin: 0.8 mg/dL (ref 0.2–1.2)
Total Protein: 7.1 g/dL (ref 6.0–8.3)

## 2021-02-25 LAB — VITAMIN D 25 HYDROXY (VIT D DEFICIENCY, FRACTURES): VITD: 53.41 ng/mL (ref 30.00–100.00)

## 2021-02-25 LAB — TSH: TSH: 0.21 u[IU]/mL — ABNORMAL LOW (ref 0.35–4.50)

## 2021-02-25 NOTE — Patient Instructions (Addendum)
  Check the  blood pressure  BP GOAL is between 110/65 and  135/85. If it is consistently higher or lower, let me know  More information about healthcare power of attorney: See below  Consider proceed with shingles vaccination (Shingrix)  Consider proceed with your COVID-vaccine #4    GO TO THE LAB : Get the blood work     [place], [place] back for for a checkup in 6 months       "Living will", "[place] of attorney": Advanced care planning  If you like more information about this issue, please visit:  [email or web address]  Advance care planning is a process that supports adults in  understanding and sharing their preferences regarding future medical care.   The patient's preferences are recorded in documents called Advance Directives.    Advanced directives are completed (and can be modified at any time) while the patient is in full mental capacity.   The documentation should be available at all times to the patient, the family and the healthcare providers.  Bring in a copy to be scanned in your chart is an excellent idea and is recommended   This legal documents direct treatment decision making and/or appoint a surrogate to make the decision if the patient is not capable to do so.    Advance directives can be documented in many types of formats,  documents have names such as:  Lliving will  Durable power of attorney for healthcare (healthcare proxy or healthcare power of attorney)  Combined directives  Physician orders for life-sustaining treatment    More information at:

## 2021-02-25 NOTE — Progress Notes (Signed)
Subjective:    Patient ID: Angela Clay, female    DOB: December 21, 1933, 87 y.o.   MRN: [id number]  DOS:  02/25/2021 Type of visit - description: Here for CPX Since the last office visit he is doing well. In addition to CPX we addressed her chronic medical problems.    Review of Systems  Other than above, a 14 point review of systems is negative      Past Medical History:  Diagnosis Date  . Actinic keratosis 05/2015   Right mid central dorsal hand  . Gallstones   . GERD (gastroesophageal reflux disease)    EGD for dysphagia 2000 aprox (-)  . History of radiation therapy   . Hyperlipidemia   . Hypertension   . Hypothyroidism   . Lymphoma ([place])    neck,mlig,unspecified site dx 1970s  . OA (osteoarthritis)    severe at neck  . Osteopenia   . Syncope 11-2012   admited . w/u (-)  . Thyroid cancer [facility])     Past Surgical History:  Procedure Laterality Date  . APPENDECTOMY    . BELPHAROPTOSIS REPAIR Bilateral 2017  . BIOPSY  11/23/2019   Procedure: BIOPSY;  Surgeon: Rush Landmark Telford Nab., MD;  Location: [facility];  Service: Gastroenterology;;  . CATARACT EXTRACTION, BILATERAL Bilateral 01/10/2019   Dr.Rankin  . CHOLECYSTECTOMY  02/24/06  . ESOPHAGOGASTRODUODENOSCOPY (EGD) WITH PROPOFOL N/A 11/23/2019   Procedure: ESOPHAGOGASTRODUODENOSCOPY (EGD) WITH PROPOFOL;  Surgeon: Rush Landmark Telford Nab., MD;  Location: [facility];  Service: Gastroenterology;  Laterality: N/A;  . EXPLORATORY LAPAROTOMY     for lymphadenomopathy; appendix removed at this time  . LEFT HEART CATH AND CORONARY ANGIOGRAPHY N/A 07/20/2018   Procedure: LEFT HEART CATH AND CORONARY ANGIOGRAPHY;  Surgeon: Belva Crome, MD;  Location: [place] CV LAB;  Service: Cardiovascular;  Laterality: N/A;  . THYROIDECTOMY    . TONSILLECTOMY AND ADENOIDECTOMY  1949  . TUBAL LIGATION  1972  . UPPER ESOPHAGEAL ENDOSCOPIC ULTRASOUND (EUS) N/A 11/23/2019   Procedure: UPPER ESOPHAGEAL ENDOSCOPIC ULTRASOUND (EUS);   Surgeon: Irving Copas., MD;  Location: [facility];  Service: Gastroenterology;  Laterality: N/A;  WITH BIOPSY   NEEDS 90 MINUTES  . vitamacular hole surgery Right 2020   eye   Social History   Socioeconomic History  . Marital status: Married    Spouse name: Iona Beard  . Number of children: 3  . Years of education: Not on file  . Highest education level: Not on file  Occupational History  . Occupation: retired  Tobacco Use  . Smoking status: Never Smoker  . Smokeless tobacco: Never Used  Vaping Use  . Vaping Use: Never used  Substance and Sexual Activity  . Alcohol use: No    Alcohol/week: 0.0 standard drinks  . Drug use: No  . Sexual activity: Not Currently    Partners: Male    Birth control/protection: Post-menopausal  Other Topics Concern  . Not on file  Social History Narrative   3 kids, 8 GK   Lives w/ husband   Lost a g-child to suicide 2017      Social Determinants of Health   Financial Resource Strain: Low Risk   . Difficulty of Paying Living Expenses: Not hard at all  Food Insecurity: No Food Insecurity  . Worried About Charity fundraiser in the Last Year: Never true  . Ran Out of Food in the Last Year: Never true  Transportation Needs: No Transportation Needs  . Lack of Transportation (Medical): No  .  Lack of Transportation (Non-Medical): No  Physical Activity: Not on file  Stress: Not on file  Social Connections: Not on file  Intimate Partner Violence: Not on file    Allergies as of 02/25/2021      Reactions   Codeine Nausea And Vomiting      Medication List       Accurate as of Feb 25, 2021 11:59 PM. If you have any questions, ask your nurse or doctor.        STOP taking these medications   Moderna COVID-19 Vaccine 100 MCG/0.5ML injection Generic drug: COVID-19 mRNA vaccine (Moderna) Stopped by: [name], MD     TAKE these medications   acetaminophen 500 MG tablet Commonly known as: TYLENOL Take 500 mg by mouth every 6 (six)  hours as needed for mild pain.   aspirin 81 MG tablet Take 81 mg by mouth at bedtime.   atorvastatin 40 MG tablet Commonly known as: LIPITOR Take 1 tablet (40 mg total) by mouth at bedtime.   CALCIUM 600 + D PO Take 1 tablet by mouth daily.   Centrum Silver 50+Women Tabs Take 1 tablet by mouth daily.   hydrochlorothiazide 25 MG tablet Commonly known as: HYDRODIURIL Take 1 tablet (25 mg total) by mouth daily.   levothyroxine 112 MCG tablet Commonly known as: Euthyrox Take 1 tablet (112 mcg total) by mouth daily before breakfast.   omeprazole 20 MG capsule Commonly known as: PRILOSEC Take 1 capsule (20 mg total) by mouth daily as needed.   SYSTANE ULTRA OP Place 1 drop into both eyes daily as needed (Dry eyes).   Vitamin D (Ergocalciferol) 1.25 MG (50000 UNIT) Caps capsule Commonly known as: DRISDOL Take 1 capsule (50,000 Units total) by mouth every 7 (seven) days.          Objective:   Physical Exam BP 137/79 (BP Location: Left Arm, Patient Position: Sitting, Cuff Size: Large)   Pulse 77   Temp (!) 97 F (36.1 C) (Temporal)   Ht 5' 3"  (1.6 m)   Wt 155 lb 12.8 oz (70.7 kg)   SpO2 97%   BMI 27.60 kg/m  General: Well developed, NAD, BMI noted Neck: No mass or lymph nodes HEENT:  Normocephalic . Face symmetric, atraumatic Lungs:  CTA B Normal respiratory effort, no intercostal retractions, no accessory muscle use. Heart: RRR,  no murmur.  Abdomen:  Not distended, soft, non-tender. No rebound or rigidity.   Lower extremities: +/+++  pretibial edema R>L Skin: Exposed areas without rash. Not pale. Not jaundice Neurologic:  alert & oriented X3.  Speech normal, gait appropriate for age and unassisted Strength symmetric and appropriate for age.  Psych: Cognition and judgment appear intact.  Cooperative with normal attention span and concentration.  Behavior appropriate. No anxious or depressed appearing.     Assessment      Assessment DM A1c 6.8  (04-2019) Hyperlipidemia HTN Hypothyroidism Carotid artery disease [place] 11-2014: 60-79% RICA stenosis. 68-11% LICA stenosis [place] 02/7261: 1-39 % B. 1 year [place] 06/2017 1-39%, no further routine [place] DJD, severe at the neck GERD, dysphagia remotely Osteopenia: -s/p fosamax 2007 to 2012. -T score -1.7 (01-2015)  -T score -1.8 (04-2017) rx ca- vitamin D Lymphoma,  Neck, 1970s Syncope, admitted-2014, workup negative Shingles: 10/02/2016, left arm Chest pain: Cath 07/19/2018, negative, pericarditis? Autoimmune hepatitis:  PLAN: Here for CPX DM: Diet controlled, last A1c very good. Hyperlipidemia: On Lipitor, last FLP very good HTN: Continue HCTZ, labs. Osteopenia, vitamin D deficiency, slightly increased calcium:  Next DEXA 2023, treating vitamin D deficiency.  Checking a PTH and ionized calcium. Autoimmune hepatitis: To see doctors at [facility] next month RTC 6 months     This visit occurred during the SARS-CoV-2 public health emergency.  Safety protocols were in place, including screening questions prior to the visit, additional usage of staff PPE, and extensive cleaning of exam room while observing appropriate contact time as indicated for disinfecting solutions.

## 2021-02-26 ENCOUNTER — Encounter: Payer: Self-pay | Admitting: Internal Medicine

## 2021-02-26 LAB — PARATHYROID HORMONE, INTACT (NO CA): PTH: 19 pg/mL (ref 16–77)

## 2021-02-26 LAB — CALCIUM, IONIZED: Calcium, Ion: 5.05 mg/dL (ref 4.8–5.6)

## 2021-02-26 NOTE — Assessment & Plan Note (Signed)
Here for CPX DM: Diet controlled, last A1c very good. Hyperlipidemia: On Lipitor, last FLP very good HTN: Continue HCTZ, labs. Osteopenia, vitamin D deficiency, slightly increased calcium: Next DEXA 2023, treating vitamin D deficiency.  Checking a PTH and ionized calcium. Autoimmune hepatitis: To see doctors at [facility] next month RTC 6 months

## 2021-02-26 NOTE — Assessment & Plan Note (Signed)
-  Tdap 2020 -PNM 23  booster 12-2017; -prevnar 2015 - shingles shot 02-2013 , shingrex rx printed before  - covid vax x 3, shot #4 discussed and encouraged --CCS: no recall  - Female care: see previous entry, has not seen gyn, we agreed see her prn persistent rash or problems  --MMG 12/07/20 ([facility])  -- life style: Discussed - labs: CMP, vitamin D, TSH, PTH and ionized calcium. - POA on file, would like some more information, see AVS.

## 2021-02-28 MED ORDER — LEVOTHYROXINE SODIUM 100 MCG PO TABS
100.0000 ug | ORAL_TABLET | Freq: Every day | ORAL | 0 refills | Status: DC
Start: 2021-02-28 — End: 2021-04-15

## 2021-02-28 NOTE — Addendum Note (Signed)
Addended byDamita Dunnings D on: 02/28/2021 02:10 PM   Modules accepted: Orders

## 2021-03-11 ENCOUNTER — Encounter: Payer: Medicare HMO | Admitting: Internal Medicine

## 2021-03-13 DIAGNOSIS — Z85828 Personal history of other malignant neoplasm of skin: Secondary | ICD-10-CM | POA: Diagnosis not present

## 2021-03-13 DIAGNOSIS — L570 Actinic keratosis: Secondary | ICD-10-CM | POA: Diagnosis not present

## 2021-03-13 DIAGNOSIS — L720 Epidermal cyst: Secondary | ICD-10-CM | POA: Diagnosis not present

## 2021-03-13 DIAGNOSIS — L821 Other seborrheic keratosis: Secondary | ICD-10-CM | POA: Diagnosis not present

## 2021-03-13 DIAGNOSIS — L309 Dermatitis, unspecified: Secondary | ICD-10-CM | POA: Diagnosis not present

## 2021-03-13 DIAGNOSIS — D225 Melanocytic nevi of trunk: Secondary | ICD-10-CM | POA: Diagnosis not present

## 2021-03-18 DIAGNOSIS — K754 Autoimmune hepatitis: Secondary | ICD-10-CM | POA: Diagnosis not present

## 2021-03-21 ENCOUNTER — Telehealth: Payer: Self-pay

## 2021-03-21 DIAGNOSIS — R7989 Other specified abnormal findings of blood chemistry: Secondary | ICD-10-CM

## 2021-03-21 DIAGNOSIS — R945 Abnormal results of liver function studies: Secondary | ICD-10-CM

## 2021-03-21 NOTE — Telephone Encounter (Signed)
-----   Message from Irving Copas., MD sent at 03/21/2021 10:45 AM EDT ----- Regarding: Patient for follow-up Angela Clay, Dr. Teena Irani [facility] hepatology) and I discussed this patient briefly again. I would like to see her back in clinic in [place] of this year. She needs a hepatic function panel done 1 week prior to this. If liver tests have remained normal then we likely will not need to have further follow-up at [facility]. Please reach out to her and let her know that Dr. Edison Pace and I have spoken and I will see her later this year. Thanks. GM

## 2021-03-21 NOTE — Telephone Encounter (Signed)
Recall and labs entered as ordered the pt has been advised

## 2021-03-27 ENCOUNTER — Ambulatory Visit: Payer: Medicare HMO | Attending: Internal Medicine

## 2021-03-27 DIAGNOSIS — Z23 Encounter for immunization: Secondary | ICD-10-CM

## 2021-03-27 NOTE — Progress Notes (Signed)
   Covid-19 Vaccination Clinic  Name:  ZURI LASCALA    MRN: [id number] DOB: 07-Oct-1934  03/27/2021  Ms. Rhames was observed post Covid-19 immunization for 15 minutes without incident. She was provided with Vaccine Information Sheet and instruction to access the V-Safe system.   Ms. Mckeag was instructed to call 911 with any severe reactions post vaccine: Marland Kitchen Difficulty breathing  . Swelling of face and throat  . A fast heartbeat  . A bad rash all over body  . Dizziness and weakness   Immunizations Administered    Name Date Dose VIS Date Route   Moderna Covid-19 Booster Vaccine 03/27/2021 10:21 AM 0.25 mL 08/15/2020 Intramuscular   Manufacturer: Moderna   Lot: [id number]   [place]: [id number]

## 2021-04-01 ENCOUNTER — Other Ambulatory Visit (HOSPITAL_BASED_OUTPATIENT_CLINIC_OR_DEPARTMENT_OTHER): Payer: Self-pay

## 2021-04-01 MED ORDER — COVID-19 MRNA VACC (MODERNA) 100 MCG/0.5ML IM SUSP
INTRAMUSCULAR | 0 refills | Status: DC
  Filled 2021-04-01: qty 0.25, 1d supply, fill #0

## 2021-04-02 ENCOUNTER — Other Ambulatory Visit: Payer: Self-pay

## 2021-04-02 ENCOUNTER — Other Ambulatory Visit (HOSPITAL_BASED_OUTPATIENT_CLINIC_OR_DEPARTMENT_OTHER): Payer: Self-pay

## 2021-04-02 ENCOUNTER — Other Ambulatory Visit (HOSPITAL_COMMUNITY): Payer: Self-pay

## 2021-04-09 ENCOUNTER — Encounter: Payer: Self-pay | Admitting: Internal Medicine

## 2021-04-09 DIAGNOSIS — M109 Gout, unspecified: Secondary | ICD-10-CM

## 2021-04-11 ENCOUNTER — Other Ambulatory Visit (INDEPENDENT_AMBULATORY_CARE_PROVIDER_SITE_OTHER): Payer: Medicare HMO

## 2021-04-11 ENCOUNTER — Other Ambulatory Visit: Payer: Self-pay

## 2021-04-11 DIAGNOSIS — E039 Hypothyroidism, unspecified: Secondary | ICD-10-CM | POA: Diagnosis not present

## 2021-04-11 DIAGNOSIS — M109 Gout, unspecified: Secondary | ICD-10-CM

## 2021-04-11 LAB — URIC ACID: Uric Acid, Serum: 7.1 mg/dL — ABNORMAL HIGH (ref 2.4–7.0)

## 2021-04-11 LAB — TSH: TSH: 0.65 u[IU]/mL (ref 0.35–4.50)

## 2021-04-12 ENCOUNTER — Other Ambulatory Visit (HOSPITAL_BASED_OUTPATIENT_CLINIC_OR_DEPARTMENT_OTHER): Payer: Self-pay

## 2021-04-15 MED ORDER — LEVOTHYROXINE SODIUM 100 MCG PO TABS: 100.0000 ug | ORAL_TABLET | Freq: Every day | ORAL | 3 refills | Status: DC

## 2021-04-15 NOTE — Addendum Note (Signed)
Addended byDamita Dunnings D on: 04/15/2021 07:52 AM   Modules accepted: Orders

## 2021-04-25 ENCOUNTER — Other Ambulatory Visit (HOSPITAL_BASED_OUTPATIENT_CLINIC_OR_DEPARTMENT_OTHER): Payer: Self-pay

## 2021-04-25 ENCOUNTER — Other Ambulatory Visit (HOSPITAL_COMMUNITY): Payer: Self-pay

## 2021-04-26 ENCOUNTER — Telehealth: Payer: Self-pay | Admitting: Pharmacist

## 2021-04-26 NOTE — Telephone Encounter (Signed)
Reviewed patient's chart, has not had f/u with CCM pharmacist in over a year. I made appt for 05/27/21 at 1:15. Will have CMA contact patient with time and verify she would like to continue with CCM program.

## 2021-04-26 NOTE — Chronic Care Management (AMB) (Signed)
    Chronic Care Management Pharmacy Assistant   Name: Angela Clay  MRN: [id number] DOB: [birth year]    Reason for Encounter: [place] office visits:  -  02/25/2021 [name], MD (PCP) Annual Wellness Exam - return in 6 months  11/22/2020 [name], MD (PCP) Hypertension follow up - return in 4 months  Recent consult visits:  12/21/2020 [facility] Non-Hodgkin Lymphoma follow up  Hospital visits:  None in previous 6 months  Medications: Outpatient Encounter Medications as of 04/26/2021  Medication Sig   acetaminophen (TYLENOL) 500 MG tablet Take 500 mg by mouth every 6 (six) hours as needed for mild pain.    aspirin 81 MG tablet Take 81 mg by mouth at bedtime.   atorvastatin (LIPITOR) 40 MG tablet Take 1 tablet (40 mg total) by mouth at bedtime.   Calcium Carb-Cholecalciferol (CALCIUM 600 + D PO) Take 1 tablet by mouth daily.   COVID-19 mRNA vaccine, Moderna, 100 MCG/0.5ML injection Inject into the muscle.   hydrochlorothiazide (HYDRODIURIL) 25 MG tablet Take 1 tablet (25 mg total) by mouth daily.   levothyroxine (SYNTHROID) 100 MCG tablet Take 1 tablet (100 mcg total) by mouth daily before breakfast.   Multiple Vitamins-Minerals (CENTRUM SILVER 50+WOMEN) TABS Take 1 tablet by mouth daily.    omeprazole (PRILOSEC) 20 MG capsule Take 1 capsule (20 mg total) by mouth daily as needed.   Polyethyl Glycol-Propyl Glycol (SYSTANE ULTRA OP) Place 1 drop into both eyes daily as needed (Dry eyes).   Vitamin D, Ergocalciferol, (DRISDOL) 1.25 MG (50000 UNIT) CAPS capsule Take 1 capsule (50,000 Units total) by mouth every 7 (seven) days.   No facility-administered encounter medications on file as of 04/26/2021.   Have you had any problems recently with your health?  Patient reports no recent problems.  Have you had any problems with your pharmacy?  Patient states she is not having any problems with her pharmacy.   What  issues or side effects are you having with your medications?  Patient states she is not having any issues with any medications that she is aware of.   What would you like me to pass along to [employer], CPP for them to help you with?  Patient states there is nothing at this time  What can we do to take care of you better?  Patient reports there is nothing at this time.  Star Rating Drugs:  Atorvastatin 5m last filled 02/28/21 90 DS   [facility] CenterClinical Pharmacist Assistant [phone]

## 2021-04-26 NOTE — Telephone Encounter (Cosign Needed)
Patient notified and verified appointment

## 2021-05-07 DIAGNOSIS — L570 Actinic keratosis: Secondary | ICD-10-CM | POA: Diagnosis not present

## 2021-05-07 DIAGNOSIS — L720 Epidermal cyst: Secondary | ICD-10-CM | POA: Diagnosis not present

## 2021-05-07 DIAGNOSIS — L245 Irritant contact dermatitis due to other chemical products: Secondary | ICD-10-CM | POA: Diagnosis not present

## 2021-05-07 DIAGNOSIS — Z85828 Personal history of other malignant neoplasm of skin: Secondary | ICD-10-CM | POA: Diagnosis not present

## 2021-05-07 DIAGNOSIS — L282 Other prurigo: Secondary | ICD-10-CM | POA: Diagnosis not present

## 2021-05-21 ENCOUNTER — Encounter: Payer: Self-pay | Admitting: Internal Medicine

## 2021-05-21 MED ORDER — COLCHICINE 0.6 MG PO CAPS: 1.0000 | ORAL_CAPSULE | Freq: Two times a day (BID) | ORAL | 0 refills | Status: DC | PRN

## 2021-05-24 ENCOUNTER — Encounter: Payer: Self-pay | Admitting: Internal Medicine

## 2021-05-24 ENCOUNTER — Ambulatory Visit (INDEPENDENT_AMBULATORY_CARE_PROVIDER_SITE_OTHER): Payer: Medicare HMO | Admitting: Internal Medicine

## 2021-05-24 ENCOUNTER — Other Ambulatory Visit: Payer: Self-pay

## 2021-05-24 VITALS — BP 142/76 | HR 72 | Temp 97.8°F | Resp 16 | Ht 63.0 in | Wt 158.5 lb

## 2021-05-24 DIAGNOSIS — M109 Gout, unspecified: Secondary | ICD-10-CM | POA: Diagnosis not present

## 2021-05-24 MED ORDER — PREDNISONE 10 MG PO TABS
ORAL_TABLET | ORAL | 0 refills | Status: DC
Start: 2021-05-24 — End: 2021-06-13

## 2021-05-24 NOTE — Progress Notes (Signed)
Subjective:    Patient ID: Angela Clay, female    DOB: 04-26-1934, 87 y.o.   MRN: [id number]  DOS:  05/24/2021 Type of visit - description: acute  Symptoms a started 3 days ago: L hand pain, swelling, some warmness. Also had pain at the top of her L shoulder when elevates her arm,  but no swelling on that area.  Denies any injuries, no cuts or bruises that she can recall. No systemic symptoms such as nausea, vomiting although  one day she felt feverish but did not check her temperature.  Review of Systems See above   Past Medical History:  Diagnosis Date   Actinic keratosis 05/2015   Right mid central dorsal hand   Gallstones    GERD (gastroesophageal reflux disease)    EGD for dysphagia 2000 aprox (-)   History of radiation therapy    Hyperlipidemia    Hypertension    Hypothyroidism    Lymphoma ([place])    neck,mlig,unspecified site dx 1970s   OA (osteoarthritis)    severe at neck   Osteopenia    Syncope 11-2012   admited . w/u (-)   Thyroid cancer [facility])     Past Surgical History:  Procedure Laterality Date   APPENDECTOMY     BELPHAROPTOSIS REPAIR Bilateral 2017   BIOPSY  11/23/2019   Procedure: BIOPSY;  Surgeon: Irving Copas., MD;  Location: [facility];  Service: Gastroenterology;;   CATARACT EXTRACTION, BILATERAL Bilateral 01/10/2019   Dr.Rankin   CHOLECYSTECTOMY  02/24/06   ESOPHAGOGASTRODUODENOSCOPY (EGD) WITH PROPOFOL N/A 11/23/2019   Procedure: ESOPHAGOGASTRODUODENOSCOPY (EGD) WITH PROPOFOL;  Surgeon: Irving Copas., MD;  Location: [facility];  Service: Gastroenterology;  Laterality: N/A;   EXPLORATORY LAPAROTOMY     for lymphadenomopathy; appendix removed at this time   LEFT HEART CATH AND CORONARY ANGIOGRAPHY N/A 07/20/2018   Procedure: LEFT HEART CATH AND CORONARY ANGIOGRAPHY;  Surgeon: Belva Crome, MD;  Location: [facility];  Service: Cardiovascular;  Laterality: N/A;   THYROIDECTOMY     TONSILLECTOMY AND ADENOIDECTOMY   1949   TUBAL LIGATION  1972   UPPER ESOPHAGEAL ENDOSCOPIC ULTRASOUND (EUS) N/A 11/23/2019   Procedure: UPPER ESOPHAGEAL ENDOSCOPIC ULTRASOUND (EUS);  Surgeon: Irving Copas., MD;  Location: [facility];  Service: Gastroenterology;  Laterality: N/A;  WITH BIOPSY   NEEDS 90 MINUTES   vitamacular hole surgery Right 2020   eye    Allergies as of 05/24/2021       Reactions   Codeine Nausea And Vomiting        Medication List        Accurate as of May 24, 2021 11:59 PM. If you have any questions, ask your nurse or doctor.          STOP taking these medications    Moderna COVID-19 Vaccine 100 MCG/0.5ML injection Generic drug: COVID-19 mRNA vaccine (Moderna) Stopped by: [name], MD   Vitamin D (Ergocalciferol) 1.25 MG (50000 UNIT) Caps capsule Commonly known as: DRISDOL Stopped by: [name], MD       TAKE these medications    acetaminophen 500 MG tablet Commonly known as: TYLENOL Take 500 mg by mouth every 6 (six) hours as needed for mild pain.   aspirin 81 MG tablet Take 81 mg by mouth at bedtime.   atorvastatin 40 MG tablet Commonly known as: LIPITOR Take 1 tablet (40 mg total) by mouth at bedtime.   CALCIUM 600 + D PO Take 1 tablet by mouth  daily.   Centrum Silver 50+Women Tabs Take 1 tablet by mouth daily.   Colchicine 0.6 MG Caps Commonly known as: Mitigare Take 1 capsule by mouth 2 (two) times daily as needed.   hydrochlorothiazide 25 MG tablet Commonly known as: HYDRODIURIL Take 1 tablet (25 mg total) by mouth daily.   levothyroxine 100 MCG tablet Commonly known as: SYNTHROID Take 1 tablet (100 mcg total) by mouth daily before breakfast.   omeprazole 20 MG capsule Commonly known as: PRILOSEC Take 1 capsule (20 mg total) by mouth daily as needed.   predniSONE 10 MG tablet Commonly known as: DELTASONE 3 tabs x 3 days, 2 tabs x 3 days, 1 tab x 3 days Started by: [name], MD   SYSTANE ULTRA OP Place 1 drop into both eyes daily as  needed (Dry eyes).           Objective:   Physical Exam BP (!) 142/76 (BP Location: Right Arm, Patient Position: Sitting, Cuff Size: Small)   Pulse 72   Temp 97.8 F (36.6 C) (Oral)   Resp 16   Ht 5' 3"  (1.6 m)   Wt 158 lb 8 oz (71.9 kg)   SpO2 97%   BMI 28.08 kg/m  General:   Well developed, NAD, BMI noted. HEENT:  Normocephalic . Face symmetric, atraumatic Shoulders: Symmetric, no TTP, no swelling.  Range of motion normal R hand: Normal L hand: Swelling noted, warmness. She has  obvious synovitis: PIPs 3-4-5 > DIPs 3-4-5.  Some of the knuckles.  Range of motion normal.  No openings, no abscesses. Skin: Not pale. Not jaundice Neurologic:  alert & oriented X3.  Speech normal, gait appropriate for age and unassisted Psych--  Cognition and judgment appear intact.  Cooperative with normal attention span and concentration.  Behavior appropriate. No anxious or depressed appearing.       Assessment    Assessment DM A1c 6.8 (04-2019) Hyperlipidemia HTN Hypothyroidism Carotid artery disease [place] 11-2014: 60-79% RICA stenosis. 00-86% LICA stenosis [place] 04/6194: 1-39 % B. 1 year [place] 06/2017 1-39%, no further routine [place] DJD, severe at the neck GERD, dysphagia remotely Osteopenia: -s/p fosamax 2007 to 2012. -T score -1.7 (01-2015)  -T score -1.8 (04-2017) rx ca- vitamin D Lymphoma,  Neck, 1970s Syncope, admitted-2014, workup negative Shingles: 10/02/2016, left arm Chest pain: Cath 07/19/2018, negative, pericarditis? Autoimmune hepatitis:  PLAN: Polyarticular gout flare: History of gout, on no daily medications, on hydrochlorothiazide long-term, no new medicines, last uric acid 7.1. Suspect this is a polyarticular gout flare. Plan: Prednisone, colchicine, hand elevation, definitely call if not improving or other joints become involved. For now we will continue with the HCTZ that she has taken for long time DM: Well-controlled, has no way to check CBGs.  Low risk to get  in trouble by taking prednisone for few days    This visit occurred during the SARS-CoV-2 public health emergency.  Safety protocols were in place, including screening questions prior to the visit, additional usage of staff PPE, and extensive cleaning of exam room while observing appropriate contact time as indicated for disinfecting solutions.

## 2021-05-24 NOTE — Patient Instructions (Addendum)
Colchicine twice a day until better  Take prednisone for few days, your blood sugar may increase, Hand elevation to help swelling.  Call if not gradually better, call if you have fever, chills or swelling in other joints

## 2021-05-26 NOTE — Assessment & Plan Note (Signed)
Polyarticular gout flare: History of gout, on no daily medications, on hydrochlorothiazide long-term, no new medicines, last uric acid 7.1. Suspect this is a polyarticular gout flare. Plan: Prednisone, colchicine, hand elevation, definitely call if not improving or other joints become involved. For now we will continue with the HCTZ that she has taken for long time DM: Well-controlled, has no way to check CBGs.  Low risk to get in trouble by taking prednisone for few days

## 2021-05-27 ENCOUNTER — Telehealth: Payer: Medicare HMO

## 2021-05-28 ENCOUNTER — Encounter: Payer: Self-pay | Admitting: Internal Medicine

## 2021-06-13 ENCOUNTER — Ambulatory Visit: Payer: Medicare HMO | Admitting: Obstetrics & Gynecology

## 2021-06-13 ENCOUNTER — Encounter: Payer: Self-pay | Admitting: Obstetrics & Gynecology

## 2021-06-13 ENCOUNTER — Other Ambulatory Visit: Payer: Self-pay

## 2021-06-13 VITALS — BP 114/72 | HR 95 | Resp 16 | Ht 63.25 in | Wt 157.0 lb

## 2021-06-13 DIAGNOSIS — M8589 Other specified disorders of bone density and structure, multiple sites: Secondary | ICD-10-CM

## 2021-06-13 DIAGNOSIS — Z780 Asymptomatic menopausal state: Secondary | ICD-10-CM

## 2021-06-13 DIAGNOSIS — Z01419 Encounter for gynecological examination (general) (routine) without abnormal findings: Secondary | ICD-10-CM

## 2021-06-13 NOTE — Progress Notes (Signed)
Angela Clay 1934-07-02 [id number]   History:    87 y.o. [id number] Married  RP:  New (>3 yrs) patient presenting for annual gyn exam   HPI: Postmenopause.  No HRT.  No PMB.  No pelvic pain.  Not sexually active.  No vulvar symptom.  Mictions normal.  BMs wnl.  Breasts wnl.  Sister with Dx of Ovarian Ca at 23 yo.  Patients Grand-child committed suicide at 48 yo in 08/2016.  BMI 27.59.  Needs to start back with fitness.  BD Osteopenia T-Score -1.8 in 2018.  Past medical history,surgical history, family history and social history were all reviewed and documented in the EPIC chart.  Gynecologic History No LMP recorded. Patient is postmenopausal.  Obstetric History OB History  Gravida Para Term Preterm AB Living  4 3 3   1 3   SAB IAB Ectopic Multiple Live Births  1       3    # Outcome Date GA Lbr Len/2nd Weight Sex Delivery Anes PTL Lv  4 Term 08/26/71 107w0d 7 lb (3.175 kg) F Vag-Spont None N LIV  3 Term 06/10/63 447w0d7 lb (3.175 kg) F Vag-Spont Gen N LIV  2 Term 03/09/61 4049w0d lb (3.175 kg) M Vag-Spont Gen N LIV  1 SAB 1959     SAB        ROS: A ROS was performed and pertinent positives and negatives are included in the history.  GENERAL: No fevers or chills. HEENT: No change in vision, no earache, sore throat or sinus congestion. NECK: No pain or stiffness. CARDIOVASCULAR: No chest pain or pressure. No palpitations. PULMONARY: No shortness of breath, cough or wheeze. GASTROINTESTINAL: No abdominal pain, nausea, vomiting or diarrhea, melena or bright red blood per rectum. GENITOURINARY: No urinary frequency, urgency, hesitancy or dysuria. MUSCULOSKELETAL: No joint or muscle pain, no back pain, no recent trauma. DERMATOLOGIC: No rash, no itching, no lesions. ENDOCRINE: No polyuria, polydipsia, no heat or cold intolerance. No recent change in weight. HEMATOLOGICAL: No anemia or easy bruising or bleeding. NEUROLOGIC: No headache, seizures, numbness, tingling or weakness.  PSYCHIATRIC: No depression, no loss of interest in normal activity or change in sleep pattern.     Exam:   BP 114/72   Pulse 95   Resp 16   Ht 5' 3.25" (1.607 m)   Wt 157 lb (71.2 kg)   BMI 27.59 kg/m   Body mass index is 27.59 kg/m.  General appearance : Well developed well nourished female. No acute distress HEENT: Eyes: no retinal hemorrhage or exudates,  Neck supple, trachea midline, no carotid bruits, no thyroidmegaly Lungs: Clear to auscultation, no rhonchi or wheezes, or rib retractions  Heart: Regular rate and rhythm, no murmurs or gallops Breast:Examined in sitting and supine position were symmetrical in appearance, no palpable masses or tenderness,  no skin retraction, no nipple inversion, no nipple discharge, no skin discoloration, no axillary or supraclavicular lymphadenopathy Abdomen: no palpable masses or tenderness, no rebound or guarding Extremities: no edema or skin discoloration or tenderness  Pelvic: Vulva: Normal             Vagina: No gross lesions or discharge  Cervix: No gross lesions or discharge  Uterus  AV, normal size, shape and consistency, non-tender and mobile  Adnexa  Without masses or tenderness  Anus: Normal   Assessment/Plan:  [age]o. female for annual exam   1. Well female exam with routine gynecological exam Normal gynecologic exam.  No  indication for a Pap test at this time.  Breast exam normal.  Screening mammogram February 2022 was negative.  Colonoscopy 2014.  Health labs with family physician.  2. Postmenopause Well on no hormone replacement therapy.  No postmenopausal bleeding. - DG Bone Density; Future  3. Osteopenia of multiple sites Osteopenia with T score of -1.8 in 2018.  Will schedule a bone density in [place] now.  Vitamin D supplements, total calcium of 1.5 g/day and regular weightbearing physical activities recommended. - DG Bone Density; Future  Other orders - hydrocortisone 2.5 % cream; APPLY CREAM TO AFFECTED AREA  TO AFFECTED AREA TWICE DAILY FOR 2 WEEKS THEN USE AS NEEDED (Patient not taking: Reported on 06/13/2021)   Princess Bruins MD, 11:05 AM 06/13/2021

## 2021-07-02 ENCOUNTER — Other Ambulatory Visit (HOSPITAL_BASED_OUTPATIENT_CLINIC_OR_DEPARTMENT_OTHER): Payer: Self-pay

## 2021-07-04 ENCOUNTER — Other Ambulatory Visit: Payer: Self-pay

## 2021-07-04 ENCOUNTER — Ambulatory Visit (HOSPITAL_BASED_OUTPATIENT_CLINIC_OR_DEPARTMENT_OTHER)
Admission: RE | Admit: 2021-07-04 | Discharge: 2021-07-04 | Disposition: A | Discharge status: Home / Self Care | Payer: Medicare HMO | Source: Ambulatory Visit | Attending: Obstetrics & Gynecology | Admitting: Obstetrics & Gynecology

## 2021-07-04 DIAGNOSIS — M8589 Other specified disorders of bone density and structure, multiple sites: Secondary | ICD-10-CM | POA: Insufficient documentation

## 2021-07-04 DIAGNOSIS — Z780 Asymptomatic menopausal state: Secondary | ICD-10-CM | POA: Diagnosis not present

## 2021-07-08 DIAGNOSIS — L723 Sebaceous cyst: Secondary | ICD-10-CM | POA: Diagnosis not present

## 2021-07-08 DIAGNOSIS — Z85828 Personal history of other malignant neoplasm of skin: Secondary | ICD-10-CM | POA: Diagnosis not present

## 2021-07-08 DIAGNOSIS — L819 Disorder of pigmentation, unspecified: Secondary | ICD-10-CM | POA: Diagnosis not present

## 2021-07-08 DIAGNOSIS — L80 Vitiligo: Secondary | ICD-10-CM | POA: Diagnosis not present

## 2021-07-08 DIAGNOSIS — L570 Actinic keratosis: Secondary | ICD-10-CM | POA: Diagnosis not present

## 2021-07-17 ENCOUNTER — Telehealth: Payer: Self-pay | Admitting: Pharmacist

## 2021-07-17 NOTE — Chronic Care Management (AMB) (Signed)
    Chronic Care Management Pharmacy Assistant   Name: Angela Clay  MRN: [id number] DOB: [birth year]  Reason for Encounter: Disease State  Recent office visits:  05/24/21-Jose Ladona Horns, MD (PCP) Seen for gout. Labs ordered. Start prednisone 10 mg.  Recent consult visits:  06/13/21 (Gynecology) Princess Bruins, MD. Seen for annual gyn exam. Bone density exam ordered. Return for Wellness visit. 05/07/21 (Dermatology) Amy [name]. Notes not available.  Hospital visits:  None in previous 6 months  Medications: Outpatient Encounter Medications as of 07/17/2021  Medication Sig   acetaminophen (TYLENOL) 500 MG tablet Take 500 mg by mouth every 6 (six) hours as needed for mild pain.    aspirin 81 MG tablet Take 81 mg by mouth at bedtime.   atorvastatin (LIPITOR) 40 MG tablet Take 1 tablet (40 mg total) by mouth at bedtime.   Calcium Carb-Cholecalciferol (CALCIUM 600 + D PO) Take 1 tablet by mouth daily.   Colchicine (MITIGARE) 0.6 MG CAPS Take 1 capsule by mouth 2 (two) times daily as needed.   hydrochlorothiazide (HYDRODIURIL) 25 MG tablet Take 1 tablet (25 mg total) by mouth daily.   hydrocortisone 2.5 % cream APPLY CREAM TO AFFECTED AREA TO AFFECTED AREA TWICE DAILY FOR 2 WEEKS THEN USE AS NEEDED (Patient not taking: Reported on 06/13/2021)   levothyroxine (SYNTHROID) 100 MCG tablet Take 1 tablet (100 mcg total) by mouth daily before breakfast.   Multiple Vitamins-Minerals (CENTRUM SILVER 50+WOMEN) TABS Take 1 tablet by mouth daily.    omeprazole (PRILOSEC) 20 MG capsule Take 1 capsule (20 mg total) by mouth daily as needed.   Polyethyl Glycol-Propyl Glycol (SYSTANE ULTRA OP) Place 1 drop into both eyes daily as needed (Dry eyes).   No facility-administered encounter medications on file as of 07/17/2021.   Have you had any problems recently with your health? Patient states she has a new gout flare up on her right foot. She was seen by her PCP for a gout flare up for her left hand  originally, but was unsure if she should continue taking the colchicine. I recommended for the patient to continue the colchicine due to her PCP prescribing it for 30 days as needed.  Have you had any problems with your pharmacy? Patient states the colchicine medication is expensive for her and would like to know if there is any over the counter medication for gout that she could try.  What issues or side effects are you having with your medications? Patient states there is no issues or side effects to any of her medications.  What would you like me to pass along to [facility] for them to help you with?  Patient states she would like to know if there is any over the counter medications for gout since colchicine is a expensive for her.  What can we do to take care of you better? Patient states there is nothing at this time.  Patient will continue CCM program and rescheduled her appointment for 10/14/21 @ 3pm.  Star Rating Drugs: Atorvastatin 40 mg Last filled:  Corrie Mckus[place], [place]

## 2021-07-18 DIAGNOSIS — H35371 Puckering of macula, right eye: Secondary | ICD-10-CM | POA: Diagnosis not present

## 2021-07-18 DIAGNOSIS — H04123 Dry eye syndrome of bilateral lacrimal glands: Secondary | ICD-10-CM | POA: Diagnosis not present

## 2021-07-18 DIAGNOSIS — Z961 Presence of intraocular lens: Secondary | ICD-10-CM | POA: Diagnosis not present

## 2021-07-18 DIAGNOSIS — H5203 Hypermetropia, bilateral: Secondary | ICD-10-CM | POA: Diagnosis not present

## 2021-07-18 LAB — HM DIABETES EYE EXAM

## 2021-07-27 ENCOUNTER — Other Ambulatory Visit: Payer: Self-pay | Admitting: Internal Medicine

## 2021-08-09 ENCOUNTER — Ambulatory Visit (INDEPENDENT_AMBULATORY_CARE_PROVIDER_SITE_OTHER): Payer: Medicare HMO | Admitting: Pharmacist

## 2021-08-09 DIAGNOSIS — I10 Essential (primary) hypertension: Secondary | ICD-10-CM

## 2021-08-09 DIAGNOSIS — M109 Gout, unspecified: Secondary | ICD-10-CM

## 2021-08-09 DIAGNOSIS — E785 Hyperlipidemia, unspecified: Secondary | ICD-10-CM

## 2021-08-09 DIAGNOSIS — I249 Acute ischemic heart disease, unspecified: Secondary | ICD-10-CM

## 2021-08-09 NOTE — Chronic Care Management (AMB) (Signed)
Chronic Care Management Pharmacy Note  08/09/2021 Name:  Angela Clay MRN:  [id number] DOB:  02/24/1934   Subjective: Angela Clay is an 87 y.o. year old female who is a primary patient of Paz, Alda Berthold, MD.  The CCM team was consulted for assistance with disease management and care coordination needs.    Engaged with patient by telephone for follow up visit in response to provider referral for pharmacy case management and/or care coordination services.   Consent to Services:  The patient was given information about Chronic Care Management services, agreed to services, and gave verbal consent prior to initiation of services.  Please see initial visit note for detailed documentation.   Patient Care Team: Colon Branch, MD as PCP - Cyndia Diver, MD as PCP - Cardiology (Cardiology) Jerrell Belfast, MD as Consulting Physician (Otolaryngology) [name], Amy, MD as Consulting Physician (Dermatology) Day, [place], [facility] (Inactive) as Pharmacist (Pharmacist) Katy Apo, MD as Consulting Physician (Ophthalmology)   Objective:  Lab Results  Component Value Date   CREATININE 0.80 02/25/2021   CREATININE 0.84 11/22/2020   CREATININE 0.85 12/29/2019    Lab Results  Component Value Date   HGBA1C 6.0 11/22/2020   Last diabetic Eye exam:  Lab Results  Component Value Date/Time   HMDIABEYEEXA No Retinopathy 06/22/2020 12:00 AM    Last diabetic Foot exam: No results found for: HMDIABFOOTEX      Component Value Date/Time   CHOL 144 11/22/2020 1206   TRIG 143.0 11/22/2020 1206   HDL 56.60 11/22/2020 1206   CHOLHDL 3 11/22/2020 1206   VLDL 28.6 11/22/2020 1206   LDLCALC 59 11/22/2020 1206   LDLDIRECT 118.5 08/25/2007 0000    Hepatic Function Latest Ref Rng & Units 02/25/2021 08/07/2020 03/22/2020  Total Protein 6.0 - 8.3 g/dL 7.1 7.7 8.1  Albumin 3.5 - 5.2 g/dL 4.0 3.9 4.1  AST 0 - 37 U/L 16 23 23   ALT 0 - 35 U/L 19 22 23   Alk Phosphatase 39 - 117 U/L 82 100  136(H)  Total Bilirubin 0.2 - 1.2 mg/dL 0.8 0.7 0.8  Bilirubin, Direct 0.0 - 0.3 mg/dL - 0.2 0.2    Lab Results  Component Value Date/Time   TSH 0.65 04/11/2021 09:25 AM   TSH 0.21 (L) 02/25/2021 11:50 AM    CBC Latest Ref Rng & Units 11/22/2020 01/10/2020 12/29/2019  WBC 4.0 - 10.5 K/uL 6.8 6.4 7.2  Hemoglobin 12.0 - 15.0 g/dL 13.6 13.0 13.1  Hematocrit 36.0 - 46.0 % 41.2 40.2 39.5  Platelets 150.0 - 400.0 K/uL 207.0 173 239.0    Lab Results  Component Value Date/Time   VD25OH 53.41 02/25/2021 11:50 AM   VD25OH 29.08 (L) 11/22/2020 12:06 PM    Clinical ASCVD: Yes  The ASCVD Risk score (Arnett DK, et al., 2019) failed to calculate for the following reasons:   The 2019 ASCVD risk score is only valid for ages 30 to 28    Other: (CHADS2VASc if Afib, PHQ9 if depression, MMRC or CAT for COPD, ACT, DEXA)  Social History   Tobacco Use  Smoking Status Never  Smokeless Tobacco Never   BP Readings from Last 3 Encounters:  06/13/21 114/72  05/24/21 (!) 142/76  02/25/21 137/79   Pulse Readings from Last 3 Encounters:  06/13/21 95  05/24/21 72  02/25/21 77   Wt Readings from Last 3 Encounters:  06/13/21 157 lb (71.2 kg)  05/24/21 158 lb 8 oz (71.9 kg)  02/25/21 155 lb 12.8  oz (70.7 kg)    Assessment: Review of patient past medical history, allergies, medications, health status, including review of consultants reports, laboratory and other test data, was performed as part of comprehensive evaluation and provision of chronic care management services.   SDOH:  (Social Determinants of Health) assessments and interventions performed:    CCM Care Plan  Allergies  Allergen Reactions   Codeine Nausea And Vomiting    Medications Reviewed Today     Reviewed by Cherre Robins, PharmD (Pharmacist) on 08/09/21 at 1033  Med List Status: <None>   Medication Order Taking? Sig Documenting Provider Last Dose Status Informant  acetaminophen (TYLENOL) 500 MG tablet [id number] Yes Take  500 mg by mouth every 6 (six) hours as needed for mild pain.  [provider] Taking Active Self           Med Note Leanord Asal, TREVINA M   Tue Jul 20, 2018  3:21 PM)    aspirin 81 MG tablet [id number] Yes Take 81 mg by mouth at bedtime. [provider] Taking Active Self  atorvastatin (LIPITOR) 40 MG tablet [id number] Yes Take 1 tablet (40 mg total) by mouth at bedtime. Colon Branch, MD Taking Active   Calcium Carb-Cholecalciferol (CALCIUM 600 + D PO) [id number] Yes Take 1 tablet by mouth daily. [provider] Taking Active Self  Colchicine (MITIGARE) 0.6 MG CAPS [id number] Yes Take 1 capsule by mouth 2 (two) times daily as needed. Colon Branch, MD Taking Active   hydrochlorothiazide (HYDRODIURIL) 25 MG tablet [id number] Yes Take 1 tablet by mouth once daily Colon Branch, MD Taking Active   hydrocortisone 2.5 % cream [id number] Yes  [provider] Taking Active   levothyroxine (SYNTHROID) 100 MCG tablet [id number] Yes Take 1 tablet (100 mcg total) by mouth daily before breakfast. Colon Branch, MD Taking Active   Multiple Vitamins-Minerals (CENTRUM SILVER 50+WOMEN) TABS [id number] Yes Take 1 tablet by mouth daily.  [provider] Taking Active Self  omeprazole (PRILOSEC) 20 MG capsule [id number] Yes Take 1 capsule (20 mg total) by mouth daily as needed. Colon Branch, MD Taking Active   Polyethyl Glycol-Propyl Glycol (SYSTANE ULTRA OP) [id number] Yes Place 1 drop into both eyes daily as needed (Dry eyes). [provider] Taking Active Self            Patient Active Problem List   Diagnosis Date Noted   Autoimmune hepatitis ([place]) 04/01/2020   Candida esophagitis ([place]) 12/31/2019   Chronic gastritis without bleeding 12/31/2019   Elevated alkaline phosphatase level 10/14/2019   Pancreatic cyst 10/14/2019   Dilated bile duct 10/14/2019   Positive antismooth muscle antibody  08/08/2019   Abnormal ultrasound of liver 07/07/2019   Abnormal LFTs 07/05/2019    Abnormal SPEP 06/22/2019   Pericarditis 08/09/2018   ACS (acute coronary syndrome) ([place]) 07/19/2018   Shingles 12/04/2015   Follow-up-----------------PCP notes  07/03/2015   Prolapse of female pelvic organs 03/11/2013   Dystrophy, vulva-- s/p bx, on clobetasol prn 03/11/2013   Monoarthritis of hand 03/10/2013   Diabetes mellitus without complication ([place]) 65/53/7482   Annual physical exam 02/19/2011   Carotid arterial disease ([place]) 02/19/2011   DEGENERATIVE JOINT DISEASE 04/26/2010   Hypothyroidism 01/11/2008   LYMPHOMA NEC, MLIG, UNSPECIFIED SITE 08/25/2007   Hyperlipidemia 08/25/2007   Essential hypertension 08/25/2007   Gastroesophageal reflux disease 08/25/2007   Osteopenia 08/25/2007   THYROIDECTOMY, HX OF 08/25/2007    Immunization History  Administered Date(s) Administered   Influenza Split 10/01/2011  Influenza Whole 08/25/2007, 09/08/2008, 08/03/2009, 08/20/2010   Influenza, High Dose Seasonal PF 07/03/2015, 07/07/2016, 07/16/2017, 07/21/2018, 08/04/2019   Influenza, Seasonal, Injecte, Preservative Fre 10/11/2012   Influenza,inj,Quad PF,6+ Mos 10/18/2013, 09/19/2014   Influenza-Unspecified 08/27/2020, 07/05/2021   Moderna SARS-COV2 Booster Vaccination 08/27/2020, 03/27/2021   Moderna Sars-Covid-2 Vaccination 11/08/2019, 12/05/2019   Pneumococcal Conjugate-13 01/02/2014   Pneumococcal Polysaccharide-23 10/27/2000, 08/25/2007, 01/15/2018   Td 10/27/1997, 08/30/2008   Tdap 08/04/2019   Zoster, Live 03/04/2013    Conditions to be addressed/monitored: CAD, HTN, HLD, and gout; ACS; GERD; hypothyroidism; low vitamin D; pre Diabetes  Care Plan : General Pharmacy (Adult)  Updates made by Cherre Robins, PHARMD since 08/09/2021 12:00 AM     Problem: Chronic conditions: gout; HTN; CAD; pre diabetes; GERD; hypothyroidism      Long-Range Goal: Provide education, support and care coordination for medication therapy and chronic conditions   Start Date: 08/09/2021   Priority: High  Note:   Current Barriers:  Unable to achieve control of gout  Need for education regarding gout treatment and disease management  Pharmacist Clinical Goal(s):  Over the next 90 days, patient will achieve control of gout as evidenced by no acute episodes of gout contact provider office for questions/concerns as evidenced notation of same in electronic health record through collaboration with PharmD and provider.   Interventions: 1:1 collaboration with Colon Branch, MD regarding development and update of comprehensive plan of care as evidenced by provider attestation and co-signature Inter-disciplinary care team collaboration (see longitudinal plan of care) Comprehensive medication review performed; medication list updated in electronic medical record  Pre - Diabetes: Lab Results  Component Value Date   HGBA1C 6.0 11/22/2020  Controlled; A1c goal < 6.5% Current treatment: diet and exercise;  Interentions: none today - addressed at previous visit  Hypertension: Controlled Current treatment: Hydrochlorothiazide 59m daily in morning  Patient has failed these meds in the past: None noted Patient checks BP at home infrequently  If she continues to have 1 or 2 acute gout episodes in future might need to consider changing hydrochlorothiazide to ACEI or ARB Denies/reports hypotensive/hypertensive symptoms Interventions:  Discussed gout and blood pressure goals Recommended checking blood pressure once per week   Hyperlipidemia / CAD: Controlled; LDL goal <70 Current treatment: atorvastatin 445mdaily Interventions:  Continue current therapy for lowering risk of recurrent heart disease and to keep LDL <70  Gout:  Last acute episode 05/24/2021 - in left hand Last uric acid 7.1 on 04/11/2021 Current treatment:  Colchicine 0.64m54mp to twice a day as needed Also has history of pericarditis treated with colchicine Patient repots cost of colchicine is high.   Interventions:  Formulary review completed; colchicine is tier 3 for tabs or capsules. Which means 30 days is $47 and 90 days if $141 Discussed prevention of gout with diet and Tart Cherry Juice - patient to try TarLiberty Globalice daily and avoid foods that can increase uric acid If she has another episode in the next year, consider either change hydrochlorothiazide which can increase uric acid or consider addition of allopurinol daily as preventative.     Patient Goals/Self-Care Activities Over the next 90 days, patient will:  take medications as prescribed, check blood pressure weekly, document, and provide at future appointments, engage in dietary modifications by limiting foods that can increase uric acid levels / gout attacks, and start Tar[id number] a day  Follow Up Plan: Telephone follow up appointment with care management team member scheduled for:  December  2022        Medication Assistance: None required.  Patient affirms current coverage meets needs.  Patient's preferred pharmacy is:  [facility], [place] [address] Phone: [phone] Fax: [phone]   Follow Up:  Patient agrees to Care Plan and Follow-up.  Plan: Telephone follow up appointment with care management team member scheduled for:  December 2022  Cherre Robins, PharmD Clinical Pharmacist [facility]

## 2021-08-09 NOTE — Patient Instructions (Signed)
Visit Information  PATIENT GOALS:  Goals Addressed             This Visit's Progress    COMPLETED: Complete uric acid lab at next office visit       COMPLETED: Complete Vitamin D lab at next office visit       [place]   On track    Pharmacist Clinical Goal(s):  Over the next 90 days, patient will achieve control of gout as evidenced by no acute episodes of gout contact provider office for questions/concerns as evidenced notation of same in electronic health record through collaboration with PharmD and provider.   Interventions: 1:1 collaboration with Colon Branch, MD regarding development and update of comprehensive plan of care as evidenced by provider attestation and co-signature Inter-disciplinary care team collaboration (see longitudinal plan of care) Comprehensive medication review performed; medication list updated in electronic medical record  Pre - Diabetes: Lab Results  Component Value Date   HGBA1C 6.0 11/22/2020  Controlled; A1c goal < 6.5% Current treatment: diet and exercise;  Interventions: none today - addressed at previous visit  Hypertension: Controlled BP Readings from Last 3 Encounters:  06/13/21 114/72  05/24/21 (!) 142/76  02/25/21 137/79  Current treatment: Hydrochlorothiazide 44m daily in morning  Interventions:  Discussed blood pressure goals Recommended checking blood pressure once per week   Hyperlipidemia / CAD: Controlled; LDL goal <70 Lipid Panel     Component Value Date/Time   CHOL 144 11/22/2020 1206   TRIG 143.0 11/22/2020 1206   HDL 56.60 11/22/2020 1206   CHOLHDL 3 11/22/2020 1206   VLDL 28.6 11/22/2020 1206   LDLCALC 59 11/22/2020 1206   LDLDIRECT 118.5 08/25/2007 0000  Current treatment: atorvastatin 468mdaily Interventions:  Continue current therapy for lowering risk of recurrent heart disease and to keep LDL <70  Gout:  Last acute episode 05/24/2021 - in left hand Last uric acid 7.1 on 04/11/2021 Current  treatment:  Colchicine 0.23m17mp to twice a day as needed  Interventions:  Formulary review completed; colchicine is tier 3 for tabs or capsules. Which means 30 days is $47 and 90 days if $141 Also reviewed to see if cost is lower at [facility] versus [facility] - verified both pharmacies are preferred and cost for medications is similar Discussed prevention of gout with diet and Tart Cherry Juice - patient to try TarLiberty Globalice daily and avoid foods that can increase uric acid    Patient Goals/Self-Care Activities Over the next 90 days, patient will:  take medications as prescribed check blood pressure weekly, document, and provide at future appointments, engage in dietary modifications by limiting foods that can increase uric acid levels / gout attacks, and  start [id number] a day  Follow Up Plan: Telephone follow up appointment with care management team member scheduled for:  December 2022          Patient verbalizes understanding of instructions provided today and agrees to view in [place] Beach[place]phone follow up appointment with care management team member scheduled for: December 2022  TammCherre RobinsarmD Clinical Pharmacist [facility] with Gout  Gout defined-Gout occurs when urate crystals accumulate in your joint causing the inflammation and intense pain of gout attack.  Urate crystals can form when you have high levels of uric acid in your blood.  Your body produces uric acid when it breaks down prurines-substances that are found naturally in your body,  as well as in certain foods such as organ meats, anchioves, herring, asparagus, and mushrooms.  Normally uric acid dissolves in your blood and passes through your kidneys into your urine.  But sometimes your body either produces too much uric acid or your kidneys excrete too little uric acid.  When this happens, uric acid can build up, forming sharp  needle-like urate crystals in a joint or surrounding tissue that cause pain, inflammation and swelling.    Gout is characterized by sudden, severe attacks of pain, redness and tenderness in joints, often the joint at the base of the big toe.  Gout is complex form of arthritis that can affect anyone.  Men are more likely to get gout but women become increasingly more susceptible to gout after menopause.  An acute attack of gout can wake you up in the middle of the night with the sensation that your big toe is on fire.  The affected joint is hot, swollen and so tender that even the weight or the sheet on it may seem intolerable.  If you experience symptoms of an acute gout attack it is important to your doctor as soon as the symptoms start.  Gout that goes untreated can lead to worsening pain and joint damage.  Risk Factors:  You are more likely to develop gout if you have high levels of uric acid in your body.    Factors that increase the uric acid level in your body include:  Lifestyle factors.  Excessive alcohol use-generally more than two drinks a day for men and more than one for women increase the risk of gout.  Medical conditions.  Certain conditions make it more likely that you will develop gout.  These include hypertension, and chronic conditions such as diabetes, high levels of fat and cholesterol in the blood, and narrowing of the arteries.  Certain medications.  The uses of Thiazide diuretics- commonly used to treat hypertension and low dose aspirin can also increase uric acid levels.  Family history of gout.  If other members of your family have had gout, you are more likely to develop the disease.  Age and sex. Gout occurs more often in men than it does in women, primarily because women tend to have lower uric acid levels than men do.  Men are more likely to develop gout earlier usually between the ages of 65-50- whereas women generally develop signs and symptoms after  menopause.   Treatment:  Treatment for gout usually involves medications.  What medications you and your doctor choose will be based on your current health and other medications you currently take.  Gout medications can be used to treat acute gout attacks and prevent future attacks as well as reduce your risk of complications from gout such as the development of tophi from urate crystal deposits.  Alternative medicine:   Certain foods have been studied for their potential to lower uric acid levels, including:  Coffee.  Studies have found an association between coffee drinking (regular and decaf) and lower uric acid levels.  The evidence is not enough to encourage non-coffee drinkers to start, but it may give clues to new ways of treating gout in the future.  Vitamin C.  Supplements containing vitamin C may reduce the levels of uric acid in your blood.  However, vitamin as a treatment for gout. Don't assume that if a little vitamin C is good, than lots is better.  Megadoses of vitamin C may increase your bodies uric acid levels.  Cherries.  Cherries have been associated with lower levels of uric acid in studies, but it isn't clear if they have any effect on gout signs and symptoms.  Eating more cherries and other dar-colored fruits, such as blackberries, blueberries, purple grapes and raspberries, may be a safe way to support your gout treatment.    Lifestyle/Diet Recommendations:  Drink 8 to 16 cups ( about 2 to 4 liters) of fluid each day, with at least half being water. Avoid alcohol Eat a moderate amount of protein, preferably from healthy sources, such as low-fat or fat-free dairy, tofu, eggs, and nut butters. Limit you daily intake of meat, fish, and poultry to 4 to 6 ounces. Avoid high fat meats and desserts. Decrease you intake of shellfish, beef, lamb, pork, eggs and cheese. Choose a good source of vitamin C daily such as citrus fruits, strawberries, broccoli,  brussel sprouts,  papaya, and cantaloupe.  Choose a good source of vitamin A every other day such as yellow fruits, or dark green/yellow vegetables. Avoid drastic weigh reduction or fasting.  If weigh loss is desired lose it over a period of several months. See "dietary considerations.." chart for specific food recommendations.  Dietary Considerations for people with Gout  Food with negligible purine content (0-15 mg of purine nitrogen per 100 grams food)  May use as desired except on calorie variations  Non fat milk Cocoa Cereals (except in list II) Hard candies  Buttermilk Carbonated drinks Vegetables (except in list II) Sherbet  Coffee Fruits Sugar Honey  Tea Cottage Cheese Gelatin-jell-o Salt  Fruit juice Breads Angel food Cake   Herbs/spices Jams/Jellies [employer]    Foods that do not contain excessive purine content, but must be limited due to fat content  Cream Eggs Oil and Salad Dressing  Half and Half Peanut Butter Chocolate  Whole Milk Cakes Potato Chips  Butter Ice Cream Fried Foods  Cheese Nuts Waffles, pancakes   List II: Food with moderate purine content (50-150 mg of purine nitrogen per 100 grams of food)  Limit total amount each day to 5 oz. cooked Lean meat, other than those on list III   Poultry, other than those on list III Fish, other than those on list III   Seafood, other than those on list III  These foods may be used occasionally  Peas Lentils Bran  Spinach Oatmeal Dried Beans and Peas  Asparagus Wheat Germ Mushrooms   Additional information about meat choices  Choose fish and poultry, particularly without skin, often.  Select lean, well trimmed cuts of meat.  Avoid all fatty meats, bacon , sausage, fried meats, fried fish, or poultry, luncheon meats, cold cuts, hot dogs, meats canned or frozen in gravy, spareribs and frozen and packaged prepared meats.   List III: Foods with HIGH purine content / Foods to AVOID (150-800 mg of purine nitrogen per 100  grams of food)  Anchovies Herring Meat Broths  Liver Mackerel Meat Extracts  Kidney Scallops Meat Drippings  Sardines Wild Game Mincemeat  Sweetbreads Goose Gravy  Heart Tongue Yeast, baker's and brewers   Commercial soups made with any of the foods listed in List II or List III  In addition avoid all alcoholic beverages

## 2021-08-26 DIAGNOSIS — E785 Hyperlipidemia, unspecified: Secondary | ICD-10-CM

## 2021-08-26 DIAGNOSIS — I249 Acute ischemic heart disease, unspecified: Secondary | ICD-10-CM | POA: Diagnosis not present

## 2021-08-26 DIAGNOSIS — I10 Essential (primary) hypertension: Secondary | ICD-10-CM | POA: Diagnosis not present

## 2021-08-28 ENCOUNTER — Ambulatory Visit: Payer: Medicare HMO | Admitting: Internal Medicine

## 2021-09-02 ENCOUNTER — Ambulatory Visit (INDEPENDENT_AMBULATORY_CARE_PROVIDER_SITE_OTHER): Payer: Medicare HMO

## 2021-09-02 VITALS — Ht 63.25 in | Wt 157.0 lb

## 2021-09-02 DIAGNOSIS — Z0000 Encounter for general adult medical examination without abnormal findings: Secondary | ICD-10-CM

## 2021-09-02 NOTE — Patient Instructions (Signed)
Angela Clay , Thank you for taking time to complete your Medicare Wellness Visit. I appreciate your ongoing commitment to your health goals. Please review the following plan we discussed and let me know if I can assist you in the future.   Screening recommendations/referrals: Colonoscopy: No longer required Mammogram: Completed 12/07/2020-Due 12/07/2021 Bone Density: Completed 07/04/2021-Due 07/05/2023 Recommended yearly ophthalmology/optometry visit for glaucoma screening and checkup Recommended yearly dental visit for hygiene and checkup  Vaccinations: Influenza vaccine: Up to date Pneumococcal vaccine: Up to date Tdap vaccine: Up to date-Due 08/03/2029 Shingles vaccine: Discuss with pharmacy   Covid-19:Booster available at the pharmacy  Advanced directives: Copy in chart  Conditions/risks identified: See problem list  Next appointment: Follow up in one year for your annual wellness visit 09/05/2022 @ 1:00.   Preventive Care 35 Years and Older, Female Preventive care refers to lifestyle choices and visits with your health care provider that can promote health and wellness. What does preventive care include? A yearly physical exam. This is also called an annual well check. Dental exams once or twice a year. Routine eye exams. Ask your health care provider how often you should have your eyes checked. Personal lifestyle choices, including: Daily care of your teeth and gums. Regular physical activity. Eating a healthy diet. Avoiding tobacco and drug use. Limiting alcohol use. Practicing safe sex. Taking low-dose aspirin every day. Taking vitamin and mineral supplements as recommended by your health care provider. What happens during an annual well check? The services and screenings done by your health care provider during your annual well check will depend on your age, overall health, lifestyle risk factors, and family history of disease. Counseling  Your health care provider may ask  you questions about your: Alcohol use. Tobacco use. Drug use. Emotional well-being. Home and relationship well-being. Sexual activity. Eating habits. History of falls. Memory and ability to understand (cognition). Work and work Statistician. Reproductive health. Screening  You may have the following tests or measurements: Height, weight, and BMI. Blood pressure. Lipid and cholesterol levels. These may be checked every 5 years, or more frequently if you are over 9 years old. Skin check. Lung cancer screening. You may have this screening every year starting at age 79 if you have a 30-pack-year history of smoking and currently smoke or have quit within the past 15 years. Fecal occult blood test (FOBT) of the stool. You may have this test every year starting at age 32. Flexible sigmoidoscopy or colonoscopy. You may have a sigmoidoscopy every 5 years or a colonoscopy every 10 years starting at age 36. Hepatitis C blood test. Hepatitis B blood test. Sexually transmitted disease (STD) testing. Diabetes screening. This is done by checking your blood sugar (glucose) after you have not eaten for a while (fasting). You may have this done every 1-3 years. Bone density scan. This is done to screen for osteoporosis. You may have this done starting at age 24. Mammogram. This may be done every 1-2 years. Talk to your health care provider about how often you should have regular mammograms. Talk with your health care provider about your test results, treatment options, and if necessary, the need for more tests. Vaccines  Your health care provider may recommend certain vaccines, such as: Influenza vaccine. This is recommended every year. Tetanus, diphtheria, and acellular pertussis (Tdap, Td) vaccine. You may need a Td booster every 10 years. Zoster vaccine. You may need this after age 11. Pneumococcal 13-valent conjugate (PCV13) vaccine. One dose is recommended after age  65. Pneumococcal  polysaccharide (PPSV23) vaccine. One dose is recommended after age 79. Talk to your health care provider about which screenings and vaccines you need and how often you need them. This information is not intended to replace advice given to you by your health care provider. Make sure you discuss any questions you have with your health care provider. Document Released: 11/09/2015 Document Revised: 07/02/2016 Document Reviewed: 08/14/2015 Elsevier Interactive Patient Education  2017 [place] Prevention in the Home Falls can cause injuries. They can happen to people of all ages. There are many things you can do to make your home safe and to help prevent falls. What can I do on the outside of my home? Regularly fix the edges of walkways and driveways and fix any cracks. Remove anything that might make you trip as you walk through a door, such as a raised step or threshold. Trim any bushes or trees on the path to your home. Use bright outdoor lighting. Clear any walking paths of anything that might make someone trip, such as rocks or tools. Regularly check to see if handrails are loose or broken. Make sure that both sides of any steps have handrails. Any raised decks and porches should have guardrails on the edges. Have any leaves, snow, or ice cleared regularly. Use sand or salt on walking paths during winter. Clean up any spills in your garage right away. This includes oil or grease spills. What can I do in the bathroom? Use night lights. Install grab bars by the toilet and in the tub and shower. Do not use towel bars as grab bars. Use non-skid mats or decals in the tub or shower. If you need to sit down in the shower, use a plastic, non-slip stool. Keep the floor dry. Clean up any water that spills on the floor as soon as it happens. Remove soap buildup in the tub or shower regularly. Attach bath mats securely with double-sided non-slip rug tape. Do not have throw rugs and other  things on the floor that can make you trip. What can I do in the bedroom? Use night lights. Make sure that you have a light by your bed that is easy to reach. Do not use any sheets or blankets that are too big for your bed. They should not hang down onto the floor. Have a firm chair that has side arms. You can use this for support while you get dressed. Do not have throw rugs and other things on the floor that can make you trip. What can I do in the kitchen? Clean up any spills right away. Avoid walking on wet floors. Keep items that you use a lot in easy-to-reach places. If you need to reach something above you, use a strong step stool that has a grab bar. Keep electrical cords out of the way. Do not use floor polish or wax that makes floors slippery. If you must use wax, use non-skid floor wax. Do not have throw rugs and other things on the floor that can make you trip. What can I do with my stairs? Do not leave any items on the stairs. Make sure that there are handrails on both sides of the stairs and use them. Fix handrails that are broken or loose. Make sure that handrails are as long as the stairways. Check any carpeting to make sure that it is firmly attached to the stairs. Fix any carpet that is loose or worn. Avoid having throw rugs at  the top or bottom of the stairs. If you do have throw rugs, attach them to the floor with carpet tape. Make sure that you have a light switch at the top of the stairs and the bottom of the stairs. If you do not have them, ask someone to add them for you. What else can I do to help prevent falls? Wear shoes that: Do not have high heels. Have rubber bottoms. Are comfortable and fit you well. Are closed at the toe. Do not wear sandals. If you use a stepladder: Make sure that it is fully opened. Do not climb a closed stepladder. Make sure that both sides of the stepladder are locked into place. Ask someone to hold it for you, if possible. Clearly  mark and make sure that you can see: Any grab bars or handrails. First and last steps. Where the edge of each step is. Use tools that help you move around (mobility aids) if they are needed. These include: Canes. Walkers. Scooters. Crutches. Turn on the lights when you go into a dark area. Replace any light bulbs as soon as they burn out. Set up your furniture so you have a clear path. Avoid moving your furniture around. If any of your floors are uneven, fix them. If there are any pets around you, be aware of where they are. Review your medicines with your doctor. Some medicines can make you feel dizzy. This can increase your chance of falling. Ask your doctor what other things that you can do to help prevent falls. This information is not intended to replace advice given to you by your health care provider. Make sure you discuss any questions you have with your health care provider. Document Released: 08/09/2009 Document Revised: 03/20/2016 Document Reviewed: 11/17/2014 Elsevier Interactive Patient Education  2017 [employer].

## 2021-09-02 NOTE — Progress Notes (Addendum)
Subjective:   Angela Clay is a 87 y.o. female who presents for Medicare Annual (Subsequent) preventive examination.  I connected with Junior today by telephone and verified that I am speaking with the correct person using two identifiers. Location patient: home Location provider: work Persons participating in the virtual visit: patient, Marine scientist.    I discussed the limitations, risks, security and privacy concerns of performing an evaluation and management service by telephone and the availability of in person appointments. I also discussed with the patient that there may be a patient responsible charge related to this service. The patient expressed understanding and verbally consented to this telephonic visit.    Interactive audio and video telecommunications were attempted between this provider and patient, however failed, due to patient having technical difficulties OR patient did not have access to video capability.  We continued and completed visit with audio only.  Some vital signs may be absent or patient reported.   Time Spent with patient on telephone encounter: 30 minutes   Review of Systems     Cardiac Risk Factors include: advanced age (>79mn, >>107women);hypertension;dyslipidemia     Objective:    Today's Vitals   09/02/21 1342  Weight: 157 lb (71.2 kg)  Height: 5' 3.25" (1.607 m)   Body mass index is 27.59 kg/m.  Advanced Directives 09/02/2021 06/28/2020 01/10/2020 11/23/2019 07/21/2019 06/28/2019 05/31/2019  Does Patient Have a Medical Advance Directive? Yes Yes Yes Yes Yes Yes Yes  Type of AParamed[place]of [place] Pass[place] will Living will Living will Living will [place] will [place] will Living will  Does patient want to make changes to medical advance directive? - No - Patient declined No - Patient declined - No - Patient declined - No - Patient declined  Copy of HG[place]in  Chart? Yes - validated most recent copy scanned in chart (See row information) - - - No - copy requested No - copy requested -    Current Medications (verified) Outpatient Encounter Medications as of 09/02/2021  Medication Sig   acetaminophen (TYLENOL) 500 MG tablet Take 500 mg by mouth every 6 (six) hours as needed for mild pain.    aspirin 81 MG tablet Take 81 mg by mouth at bedtime.   atorvastatin (LIPITOR) 40 MG tablet Take 1 tablet (40 mg total) by mouth at bedtime.   Calcium Carb-Cholecalciferol (CALCIUM 600 + D PO) Take 1 tablet by mouth daily.   Colchicine (MITIGARE) 0.6 MG CAPS Take 1 capsule by mouth 2 (two) times daily as needed.   hydrochlorothiazide (HYDRODIURIL) 25 MG tablet Take 1 tablet by mouth once daily   hydrocortisone 2.5 % cream    levothyroxine (SYNTHROID) 100 MCG tablet Take 1 tablet (100 mcg total) by mouth daily before breakfast.   Multiple Vitamins-Minerals (CENTRUM SILVER 50+WOMEN) TABS Take 1 tablet by mouth daily.    omeprazole (PRILOSEC) 20 MG capsule Take 1 capsule (20 mg total) by mouth daily as needed.   Polyethyl Glycol-Propyl Glycol (SYSTANE ULTRA OP) Place 1 drop into both eyes daily as needed (Dry eyes).   Tart Cherry 1200 MG CAPS Take by mouth daily.   No facility-administered encounter medications on file as of 09/02/2021.    Allergies (verified) Codeine   History: Past Medical History:  Diagnosis Date   Actinic keratosis 05/2015   Right mid central dorsal hand   Autoimmune hepatitis ([place]    Gallstones    GERD (gastroesophageal reflux disease)  EGD for dysphagia 2000 aprox (-)   History of radiation therapy    Hyperlipidemia    Hypertension    Hypothyroidism    Lymphoma ([place])    neck,mlig,unspecified site dx 1970s   OA (osteoarthritis)    severe at neck   Osteopenia    Pancreatic cyst    Syncope 11/2012   admited . w/u (-)   Thyroid cancer [facility])    Past Surgical History:  Procedure Laterality Date   APPENDECTOMY      BELPHAROPTOSIS REPAIR Bilateral 2017   BIOPSY  11/23/2019   Procedure: BIOPSY;  Surgeon: Irving Copas., MD;  Location: [facility];  Service: Gastroenterology;;   CATARACT EXTRACTION, BILATERAL Bilateral 01/10/2019   Dr.Rankin   CHOLECYSTECTOMY  02/24/2006   ESOPHAGOGASTRODUODENOSCOPY (EGD) WITH PROPOFOL N/A 11/23/2019   Procedure: ESOPHAGOGASTRODUODENOSCOPY (EGD) WITH PROPOFOL;  Surgeon: Irving Copas., MD;  Location: [facility];  Service: Gastroenterology;  Laterality: N/A;   EXPLORATORY LAPAROTOMY     for lymphadenomopathy; appendix removed at this time   LEFT HEART CATH AND CORONARY ANGIOGRAPHY N/A 07/20/2018   Procedure: LEFT HEART CATH AND CORONARY ANGIOGRAPHY;  Surgeon: Belva Crome, MD;  Location: [facility];  Service: Cardiovascular;  Laterality: N/A;   LIVER BIOPSY     THYROIDECTOMY     TONSILLECTOMY AND ADENOIDECTOMY  1949   TUBAL LIGATION  1972   UPPER ESOPHAGEAL ENDOSCOPIC ULTRASOUND (EUS) N/A 11/23/2019   Procedure: UPPER ESOPHAGEAL ENDOSCOPIC ULTRASOUND (EUS);  Surgeon: Irving Copas., MD;  Location: [facility];  Service: Gastroenterology;  Laterality: N/A;  WITH BIOPSY   NEEDS 42 MINUTES   vitamacular hole surgery Right 2020   eye   Family History  Problem Relation Age of Onset   Stroke Mother 39   Coronary artery disease Father        MI age 36   Deep vein thrombosis Brother        PE at age 68   Uterine cancer Sister    Heart failure Sister    Diabetes Neg Hx    Colon cancer Neg Hx    Breast cancer Neg Hx    Inflammatory bowel disease Neg Hx    Liver disease Neg Hx    Pancreatic cancer Neg Hx    Rectal cancer Neg Hx    Stomach cancer Neg Hx    Social History   Socioeconomic History   Marital status: Married    Spouse name: Iona Beard   Number of children: 3   Years of education: Not on file   Highest education level: Not on file  Occupational History   Occupation: retired  Tobacco Use   Smoking status: Never    Smokeless tobacco: Never  Vaping Use   Vaping Use: Never used  Substance and Sexual Activity   Alcohol use: No    Alcohol/week: 0.0 standard drinks   Drug use: No   Sexual activity: Not Currently    Partners: Male    Birth control/protection: Post-menopausal  Other Topics Concern   Not on file  Social History Narrative   3 kids, 8 GK   Lives w/ husband   Lost a g-child to suicide 2017      Social Determinants of Health   Financial Resource Strain: Low Risk    Difficulty of Paying Living Expenses: Not hard at all  Food Insecurity: No Food Insecurity   Worried About Charity fundraiser in the Last Year: Never true   Arboriculturist in  the Last Year: Never true  Transportation Needs: No Transportation Needs   Lack of Transportation (Medical): No   Lack of Transportation (Non-Medical): No  Physical Activity: Inactive   Days of Exercise per Week: 0 days   Minutes of Exercise per Session: 0 min  Stress: Stress Concern Present   Feeling of Stress : To some extent  Social Connections: Engineer, building services of Communication with Friends and Family: More than three times a week   Frequency of Social Gatherings with Friends and Family: More than three times a week   Attends Religious Services: More than 4 times per year   Active Member of [employer] or Organizations: Yes   Attends Music therapist: More than 4 times per year   Marital Status: Married    Tobacco Counseling Counseling given: Not Answered   Clinical Intake:  Pre-visit preparation completed: Yes  Pain : No/denies pain     BMI - recorded: 27.59 Nutritional Status: BMI 25 -29 Overweight Nutritional Risks: None Diabetes: Yes CBG done?: No Did pt. bring in CBG monitor from home?: No  How often do you need to have someone help you when you read instructions, pamphlets, or other written materials from your doctor or pharmacy?: 1 - Never  Diabetes:  Is the patient diabetic?  Yes  If  diabetic, was a CBG obtained today?  No  Did the patient bring in their glucometer from home?  No phone visit How often do you monitor your CBG's? never.   Financial Strains and Diabetes Management:  Are you having any financial strains with the device, your supplies or your medication? No .  Does the patient want to be seen by Chronic Care Management for management of their diabetes?  No  Would the patient like to be referred to a Nutritionist or for Diabetic Management?  No   Diabetic Exams:  Diabetic Eye Exam:Completed 07/2021-per patient.  Diabetic Foot Exam: Pt has been advised about the importance in completing this exam. To be completed by PCP.   Interpreter Needed?: No  Information entered by :: Caroleen Hamman LPN   Activities of Daily Living In your present state of health, do you have any difficulty performing the following activities: 09/02/2021  Hearing? N  Vision? N  Difficulty concentrating or making decisions? N  Walking or climbing stairs? N  Dressing or bathing? N  Doing errands, shopping? N  Preparing Food and eating ? N  Using the Toilet? N  In the past six months, have you accidently leaked urine? N  Do you have problems with loss of bowel control? N  Managing your Medications? N  Managing your Finances? N  Housekeeping or managing your Housekeeping? N  Some recent data might be hidden    Patient Care Team: Colon Branch, MD as PCP - Cyndia Diver, MD as PCP - Cardiology (Cardiology) Jerrell Belfast, MD as Consulting Physician (Otolaryngology) [name], Amy, MD as Consulting Physician (Dermatology) Day, Melvenia Beam, [facility] (Inactive) as Pharmacist (Pharmacist) Katy Apo, MD as Consulting Physician (Ophthalmology)  Indicate any recent Medical Services you may have received from other than [facility] providers in the past year (date may be approximate).     Assessment:   This is a routine wellness examination for Angela Clay.  Hearing/Vision  screen Hearing Screening - Comments:: No issues Vision Screening - Comments:: Last eye exam-07/2021-Dr. Lyles  Dietary issues and exercise activities discussed: Current Exercise Habits: The patient does not participate in regular exercise at present, Exercise limited  by: None identified   Goals Addressed             This Visit's Progress    Patient Stated       Start goin to the gym       Depression Screen PHQ 2/9 Scores 09/02/2021 11/22/2020 06/28/2020 05/31/2019 05/25/2018 05/11/2017 07/07/2016  PHQ - 2 Score 1 0 0 0 0 0 0    Fall Risk Fall Risk  09/02/2021 11/22/2020 06/28/2020 05/31/2019 05/25/2018  Falls in the past year? 0 0 0 0 No  Number falls in past yr: 0 0 0 - -  Injury with Fall? 0 0 0 - -  Follow up Falls prevention discussed Falls evaluation completed Education provided;Falls prevention discussed - -    FALL RISK PREVENTION PERTAINING TO THE HOME:  Any stairs in or around the home? No  Home free of loose throw rugs in walkways, pet beds, electrical cords, etc? Yes  Adequate lighting in your home to reduce risk of falls? Yes   ASSISTIVE DEVICES UTILIZED TO PREVENT FALLS:  Life alert? No  Use of a cane, walker or w/c? No  Grab bars in the bathroom? No  Shower chair or bench in shower? No  Elevated toilet seat or a handicapped toilet? No   TIMED UP AND GO:  Was the test performed? No . Phone visit   Cognitive Function:Normal cognitive status assessed by this Nurse Health Advisor. No abnormalities found.   MMSE - Mini Mental State Exam 05/11/2017  Orientation to time 5  Orientation to Place 5  Registration 3  Attention/ Calculation 5  Recall 2  Language- name 2 objects 2  Language- repeat 1  Language- follow 3 step command 2  Language- read & follow direction 1  Write a sentence 1  Copy design 1  Total score 28        Immunizations Immunization History  Administered Date(s) Administered   Influenza Split 10/01/2011   Influenza Whole 08/25/2007,  09/08/2008, 08/03/2009, 08/20/2010   Influenza, High Dose Seasonal PF 07/03/2015, 07/07/2016, 07/16/2017, 07/21/2018, 08/04/2019   Influenza, Seasonal, Injecte, Preservative Fre 10/11/2012   Influenza,inj,Quad PF,6+ Mos 10/18/2013, 09/19/2014   Influenza-Unspecified 08/27/2020, 07/05/2021   Moderna SARS-COV2 Booster Vaccination 08/27/2020, 03/27/2021   Moderna Sars-Covid-2 Vaccination 11/08/2019, 12/05/2019   Pneumococcal Conjugate-13 01/02/2014   Pneumococcal Polysaccharide-23 10/27/2000, 08/25/2007, 01/15/2018   Td 10/27/1997, 08/30/2008   Tdap 08/04/2019   Zoster, Live 03/04/2013    TDAP status: Up to date  Flu Vaccine status: Up to date  Pneumococcal vaccine status: Up to date  Covid-19 vaccine status: Information provided on how to obtain vaccines.   Qualifies for Shingles Vaccine? Yes   Zostavax completed Yes   Shingrix Completed?: No.    Education has been provided regarding the importance of this vaccine. Patient has been advised to call insurance company to determine out of pocket expense if they have not yet received this vaccine. Advised may also receive vaccine at local pharmacy or Health Dept. Verbalized acceptance and understanding.  Screening Tests Health Maintenance  Topic Date Due   FOOT EXAM  Never done   URINE MICROALBUMIN  Never done   Zoster Vaccines- Shingrix (1 of 2) Never done   COVID-19 Vaccine (3 - Moderna risk series) 04/24/2021   HEMOGLOBIN A1C  05/22/2021   OPHTHALMOLOGY EXAM  06/22/2021   MAMMOGRAM  12/07/2021   TETANUS/TDAP  08/03/2029   Pneumonia Vaccine 65+ Years old  Completed   INFLUENZA VACCINE  Completed   DEXA SCAN  Completed   HPV VACCINES  Aged Out    Health Maintenance  Health Maintenance Due  Topic Date Due   FOOT EXAM  Never done   URINE MICROALBUMIN  Never done   Zoster Vaccines- Shingrix (1 of 2) Never done   COVID-19 Vaccine (3 - Moderna risk series) 04/24/2021   HEMOGLOBIN A1C  05/22/2021   OPHTHALMOLOGY EXAM   06/22/2021    Colorectal cancer screening: No longer required.   Mammogram status: Completed bilateral 12/07/2020. Repeat every year  Bone Density status: Completed 07/04/2021. Results reflect: Bone density results: OSTEOPENIA. Repeat every 2 years.  Lung Cancer Screening: (Low Dose CT Chest recommended if Age 67-80 years, 30 pack-year currently smoking OR have quit w/in 15years.) does not qualify.     Additional Screening:  Hepatitis C Screening: does not qualify  Vision Screening: Recommended annual ophthalmology exams for early detection of glaucoma and other disorders of the eye. Is the patient up to date with their annual eye exam?  \ Who is the provider or what is the name of the office in which the patient attends annual eye exams? Dr. Prudencio Burly   Dental Screening: Recommended annual dental exams for proper oral hygiene  Community Resource Referral / Chronic Care Management: CRR required this visit?  No   CCM required this visit?  No      Plan:     I have personally reviewed and noted the following in the patient's chart:   Medical and social history Use of alcohol, tobacco or illicit drugs  Current medications and supplements including opioid prescriptions.  Functional ability and status Nutritional status Physical activity Advanced directives List of other physicians Hospitalizations, surgeries, and ER visits in previous 12 months Vitals Screenings to include cognitive, depression, and falls Referrals and appointments  In addition, I have reviewed and discussed with patient certain preventive protocols, quality metrics, and best practice recommendations. A written personalized care plan for preventive services as well as general preventive health recommendations were provided to patient.   Due to this being a telephonic visit, the after visit summary with patients personalized plan was offered to patient via mail or my-chart. Patient would like to access on  my-chart.   Marta Antu, LPN   22/11/9796  Nurse Health Advisor  Nurse Notes: None  I have reviewed and agree with Health Coaches documentation.  [name], MD

## 2021-09-03 ENCOUNTER — Other Ambulatory Visit: Payer: Self-pay | Admitting: Internal Medicine

## 2021-09-12 ENCOUNTER — Ambulatory Visit (INDEPENDENT_AMBULATORY_CARE_PROVIDER_SITE_OTHER): Payer: Medicare HMO | Admitting: Internal Medicine

## 2021-09-12 ENCOUNTER — Other Ambulatory Visit: Payer: Self-pay

## 2021-09-12 ENCOUNTER — Encounter: Payer: Self-pay | Admitting: Internal Medicine

## 2021-09-12 VITALS — BP 124/72 | HR 75 | Temp 98.0°F | Resp 16 | Ht 63.0 in | Wt 162.0 lb

## 2021-09-12 DIAGNOSIS — I10 Essential (primary) hypertension: Secondary | ICD-10-CM | POA: Diagnosis not present

## 2021-09-12 DIAGNOSIS — E119 Type 2 diabetes mellitus without complications: Secondary | ICD-10-CM | POA: Diagnosis not present

## 2021-09-12 DIAGNOSIS — E039 Hypothyroidism, unspecified: Secondary | ICD-10-CM

## 2021-09-12 DIAGNOSIS — E785 Hyperlipidemia, unspecified: Secondary | ICD-10-CM | POA: Diagnosis not present

## 2021-09-12 DIAGNOSIS — K754 Autoimmune hepatitis: Secondary | ICD-10-CM

## 2021-09-12 LAB — MICROALBUMIN / CREATININE URINE RATIO
Creatinine,U: 30.9 mg/dL
Microalb Creat Ratio: 2.3 mg/g (ref 0.0–30.0)
Microalb, Ur: <0.7 mg/dL (ref 0.0–1.9)

## 2021-09-12 LAB — CBC WITH DIFFERENTIAL/PLATELET
Basophils Absolute: 0 10*3/uL (ref 0.0–0.1)
Basophils Relative: 0.7 % (ref 0.0–3.0)
Eosinophils Absolute: 0.2 10*3/uL (ref 0.0–0.7)
Eosinophils Relative: 3.8 % (ref 0.0–5.0)
HCT: 39 % (ref 36.0–46.0)
Hemoglobin: 13.2 g/dL (ref 12.0–15.0)
Lymphocytes Relative: 21.5 % (ref 12.0–46.0)
Lymphs Abs: 1.2 10*3/uL (ref 0.7–4.0)
MCHC: 33.9 g/dL (ref 30.0–36.0)
MCV: 87.8 fl (ref 78.0–100.0)
Monocytes Absolute: 0.3 10*3/uL (ref 0.1–1.0)
Monocytes Relative: 6.2 % (ref 3.0–12.0)
Neutro Abs: 3.7 10*3/uL (ref 1.4–7.7)
Neutrophils Relative %: 67.8 % (ref 43.0–77.0)
Platelets: 181 10*3/uL (ref 150.0–400.0)
RBC: 4.44 Mil/uL (ref 3.87–5.11)
RDW: 14.4 % (ref 11.5–15.5)
WBC: 5.5 10*3/uL (ref 4.0–10.5)

## 2021-09-12 LAB — COMPREHENSIVE METABOLIC PANEL
ALT: 17 U/L (ref 0–35)
AST: 18 U/L (ref 0–37)
Albumin: 4.1 g/dL (ref 3.5–5.2)
Alkaline Phosphatase: 82 U/L (ref 39–117)
BUN: 11 mg/dL (ref 6–23)
CO2: 32 mEq/L (ref 19–32)
Calcium: 9.3 mg/dL (ref 8.4–10.5)
Chloride: 100 mEq/L (ref 96–112)
Creatinine, Ser: 0.81 mg/dL (ref 0.40–1.20)
GFR: 66.02 mL/min (ref >60.00)
Glucose, Bld: 123 mg/dL — ABNORMAL HIGH (ref 70–99)
Potassium: 3.8 mEq/L (ref 3.5–5.1)
Sodium: 139 mEq/L (ref 135–145)
Total Bilirubin: 0.8 mg/dL (ref 0.2–1.2)
Total Protein: 6.9 g/dL (ref 6.0–8.3)

## 2021-09-12 LAB — HEMOGLOBIN A1C: Hgb A1c MFr Bld: 6.2 % (ref 4.6–6.5)

## 2021-09-12 LAB — TSH: TSH: 0.88 u[IU]/mL (ref 0.35–5.50)

## 2021-09-12 NOTE — Progress Notes (Signed)
Subjective:    Patient ID: Angela Clay, female    DOB: 08-07-1934, 87 y.o.   MRN: [id number]  DOS:  09/12/2021 Type of visit - description: Follow-up  Since the last office visit she is doing well. Today with talk about diabetes, hypertension, thyroid disease. I also reviewed the chart, she saw GI at [facility] last 02/2021. We also discussed vaccinations  Review of Systems Reported pain and stiffness of the fingers early in the morning, symptoms resolve within an hour Recently had gout, no further episodes Denies lower extremity paresthesias.  Past Medical History:  Diagnosis Date   Actinic keratosis 05/2015   Right mid central dorsal hand   Autoimmune hepatitis ([place])    Gallstones    GERD (gastroesophageal reflux disease)    EGD for dysphagia 2000 aprox (-)   History of radiation therapy    Hyperlipidemia    Hypertension    Hypothyroidism    Lymphoma ([place])    neck,mlig,unspecified site dx 1970s   OA (osteoarthritis)    severe at neck   Osteopenia    Pancreatic cyst    Syncope 11/2012   admited . w/u (-)   Thyroid cancer [facility])     Past Surgical History:  Procedure Laterality Date   APPENDECTOMY     BELPHAROPTOSIS REPAIR Bilateral 2017   BIOPSY  11/23/2019   Procedure: BIOPSY;  Surgeon: Irving Copas., MD;  Location: [facility];  Service: Gastroenterology;;   CATARACT EXTRACTION, BILATERAL Bilateral 01/10/2019   Dr.Rankin   CHOLECYSTECTOMY  02/24/2006   ESOPHAGOGASTRODUODENOSCOPY (EGD) WITH PROPOFOL N/A 11/23/2019   Procedure: ESOPHAGOGASTRODUODENOSCOPY (EGD) WITH PROPOFOL;  Surgeon: Irving Copas., MD;  Location: [facility];  Service: Gastroenterology;  Laterality: N/A;   EXPLORATORY LAPAROTOMY     for lymphadenomopathy; appendix removed at this time   LEFT HEART CATH AND CORONARY ANGIOGRAPHY N/A 07/20/2018   Procedure: LEFT HEART CATH AND CORONARY ANGIOGRAPHY;  Surgeon: Belva Crome, MD;  Location: [facility] [facility];  Service:  Cardiovascular;  Laterality: N/A;   LIVER BIOPSY     THYROIDECTOMY     TONSILLECTOMY AND ADENOIDECTOMY  1949   TUBAL LIGATION  1972   UPPER ESOPHAGEAL ENDOSCOPIC ULTRASOUND (EUS) N/A 11/23/2019   Procedure: UPPER ESOPHAGEAL ENDOSCOPIC ULTRASOUND (EUS);  Surgeon: Irving Copas., MD;  Location: [facility];  Service: Gastroenterology;  Laterality: N/A;  WITH BIOPSY   NEEDS 90 MINUTES   vitamacular hole surgery Right 2020   eye    Allergies as of 09/12/2021       Reactions   Codeine Nausea And Vomiting        Medication List        Accurate as of September 12, 2021 11:59 PM. If you have any questions, ask your nurse or doctor.          acetaminophen 500 MG tablet Commonly known as: TYLENOL Take 500 mg by mouth every 6 (six) hours as needed for mild pain.   aspirin 81 MG tablet Take 81 mg by mouth at bedtime.   atorvastatin 40 MG tablet Commonly known as: LIPITOR TAKE 1 TABLET BY MOUTH AT BEDTIME   CALCIUM 600 + D PO Take 1 tablet by mouth daily.   Centrum Silver 50+Women Tabs Take 1 tablet by mouth daily.   Colchicine 0.6 MG Caps Commonly known as: Mitigare Take 1 capsule by mouth 2 (two) times daily as needed.   hydrochlorothiazide 25 MG tablet Commonly known as: HYDRODIURIL Take 1 tablet by mouth once daily  hydrocortisone 2.5 % cream   levothyroxine 100 MCG tablet Commonly known as: SYNTHROID Take 1 tablet (100 mcg total) by mouth daily before breakfast.   omeprazole 20 MG capsule Commonly known as: PRILOSEC Take 1 capsule (20 mg total) by mouth daily as needed.   SYSTANE ULTRA OP Place 1 drop into both eyes daily as needed (Dry eyes).   Tart Cherry 1200 MG Caps Take by mouth daily.           Objective:   Physical Exam BP 124/72 (BP Location: Left Arm, Patient Position: Sitting, Cuff Size: Small)   Pulse 75   Temp 98 F (36.7 C) (Oral)   Resp 16   Ht 5' 3"  (1.6 m)   Wt 162 lb (73.5 kg)   SpO2 98%   BMI 28.70 kg/m   General:   Well developed, NAD, BMI noted. HEENT:  Normocephalic . Face symmetric, atraumatic Lungs:  CTA B Normal respiratory effort, no intercostal retractions, no accessory muscle use. Heart: RRR,  no murmur. DM foot exam: Good pedal pulses, pinprick examination normal, trace.  Ankle and pretibial edema Skin: Not pale. Not jaundice Neurologic:  alert & oriented X3.  Speech normal, gait appropriate for age and unassisted Psych--  Cognition and judgment appear intact.  Cooperative with normal attention span and concentration.  Behavior appropriate. No anxious or depressed appearing.      Assessment     Assessment DM A1c 6.8 (04-2019) Hyperlipidemia HTN Hypothyroidism Gout  Autoimmune hepatitis: Carotid artery disease [place] 11-2014: 60-79% RICA stenosis. 06-00% LICA stenosis [place] 01/5996: 1-39 % B. 1 year [place] 06/2017 1-39%, no further routine [place] DJD, severe at the neck GERD, dysphagia remotely Osteopenia: -s/p fosamax 2007 to 2012. -T score -1.7 (01-2015)  -T score -1.8 (04-2017) rx ca- vitamin D Lymphoma,  Neck, 1970s Syncope, admitted-2014, workup negative Shingles: 10/02/2016, left arm Chest pain: Cath 07/19/2018, negative, pericarditis?  PLAN: DM: Diet controlled, foot exam normal, check A1c and micro Hyperlipidemia: On atorvastatin.  Check FLP on RTC HTN: BP today is great, ambulatory BPs reportedly normal.  Continue HCTZ, check CMP and CBC Hypothyroidism: On Synthroid, labs. Autoimmune hepatitis: Seen at [facility]  02/2021, the [place] clinic is too far, thus they elected to f/u in [place].  Checking labs.  Refer to local GI for routine checkup. Preventive care: Recommend a COVID booster.  Had a flu shot. RTC 4 months, routine checkup.    This visit occurred during the SARS-CoV-2 public health emergency.  Safety protocols were in place, including screening questions prior to the visit, additional usage of staff PPE, and extensive cleaning of exam room while observing appropriate  contact time as indicated for disinfecting solutions.

## 2021-09-12 NOTE — Patient Instructions (Addendum)
Per our records you are due for your diabetic eye exam. Please contact your eye doctor to schedule an appointment. Please have them send copies of your office visit notes to [place]. Our fax number is [phone]. If you need a referral to an eye doctor please let us know.   Proceed with a COVID booster vaccine at your convenience  Check your blood pressure regularly BP GOAL is between 110/65 and  135/85. If it is consistently higher or lower, let me know  We are referring you to gastroenterology  GO TO THE LAB : Get the blood work     [place], [place] back for a checkup in 4 months

## 2021-09-13 NOTE — Assessment & Plan Note (Signed)
Assessment DM A1c 6.8 (04-2019) Hyperlipidemia HTN Hypothyroidism Gout  Autoimmune hepatitis: Carotid artery disease [place] 11-2014: 60-79% RICA stenosis. 37-35% LICA stenosis [place] 04/8977: 1-39 % B. 1 year [place] 06/2017 1-39%, no further routine [place] DJD, severe at the neck GERD, dysphagia remotely Osteopenia: -s/p fosamax 2007 to 2012. -T score -1.7 (01-2015)  -T score -1.8 (04-2017) rx ca- vitamin D Lymphoma,  Neck, 1970s Syncope, admitted-2014, workup negative Shingles: 10/02/2016, left arm Chest pain: Cath 07/19/2018, negative, pericarditis?  PLAN: DM: Diet controlled, foot exam normal, check A1c and micro Hyperlipidemia: On atorvastatin.  Check FLP on RTC HTN: BP today is great, ambulatory BPs reportedly normal.  Continue HCTZ, check CMP and CBC Hypothyroidism: On Synthroid, labs. Autoimmune hepatitis: Seen at [facility]  02/2021, the [place] clinic is too far, thus they elected to f/u in [place].  Checking labs.  Refer to local GI for routine checkup. Preventive care: Recommend a COVID booster.  Had a flu shot. RTC 4 months, routine checkup.

## 2021-09-30 ENCOUNTER — Ambulatory Visit: Payer: Medicare HMO | Attending: Internal Medicine

## 2021-09-30 ENCOUNTER — Other Ambulatory Visit (HOSPITAL_BASED_OUTPATIENT_CLINIC_OR_DEPARTMENT_OTHER): Payer: Self-pay

## 2021-09-30 DIAGNOSIS — Z23 Encounter for immunization: Secondary | ICD-10-CM

## 2021-09-30 MED ORDER — MODERNA COVID-19 BIVAL BOOSTER 50 MCG/0.5ML IM SUSP
INTRAMUSCULAR | 0 refills | Status: DC
  Filled 2021-09-30: qty 0.5, 1d supply, fill #0

## 2021-09-30 NOTE — Progress Notes (Signed)
   Covid-19 Vaccination Clinic  Name:  Angela Clay    MRN: [id number] DOB: [birth year]  09/30/2021  [name] was observed post Covid-19 immunization for 15 minutes without incident. She was provided with Vaccine Information Sheet and instruction to access the V-Safe system.   [name] was instructed to call 911 with any severe reactions post vaccine: Difficulty breathing  Swelling of face and throat  A fast heartbeat  A bad rash all over body  Dizziness and weakness

## 2021-10-14 ENCOUNTER — Ambulatory Visit (INDEPENDENT_AMBULATORY_CARE_PROVIDER_SITE_OTHER): Payer: Medicare HMO | Admitting: Pharmacist

## 2021-10-14 DIAGNOSIS — E039 Hypothyroidism, unspecified: Secondary | ICD-10-CM

## 2021-10-14 DIAGNOSIS — I249 Acute ischemic heart disease, unspecified: Secondary | ICD-10-CM

## 2021-10-14 DIAGNOSIS — I10 Essential (primary) hypertension: Secondary | ICD-10-CM

## 2021-10-14 DIAGNOSIS — E119 Type 2 diabetes mellitus without complications: Secondary | ICD-10-CM

## 2021-10-14 DIAGNOSIS — M109 Gout, unspecified: Secondary | ICD-10-CM

## 2021-10-14 DIAGNOSIS — E785 Hyperlipidemia, unspecified: Secondary | ICD-10-CM

## 2021-10-14 NOTE — Patient Instructions (Addendum)
Mrs. Sant It was a pleasure speaking with you today.  I have attached a summary of our visit today and information about your health goals.   Our next appointment is by telephone on February 11, 2022 at 11:00am  Please call the care guide team at [phone] if you need to cancel or reschedule your appointment.   If you have any questions or concerns, please feel free to contact me either at the phone number below or with a MyChart message.   Keep up the good work!  Cherre Robins, PharmD Clinical Pharmacist [facility] [phone] (direct line)  [phone] (main office number)               Exercise for Strong Bones  Exercise is important to build and maintain strong bones / bone density.  There are 2 types of exercises that are important to building and maintaining strong bones:  Weight- bearing and muscle-stregthening.  Weight-bearing Exercises  These exercises include activities that make you move against gravity while staying upright. Weight-bearing exercises can be high-impact or low-impact.  High-impact weight-bearing exercises help build bones and keep them strong. If you have broken a bone due to osteoporosis or are at risk of breaking a bone, you may need to avoid high-impact exercises. If youre not sure, you should check with your healthcare provider.  Examples of high-impact weight-bearing exercises are: Dancing  Doing high-impact aerobics  Hiking  Jogging/running  Jumping Rope  Stair climbing  Tennis  Low-impact weight-bearing exercises can also help keep bones strong and are a safe alternative if you cannot do high-impact exercises.   Examples of low-impact weight-bearing exercises are: Using elliptical training machines  Doing low-impact aerobics  Using stair-step machines  Fast walking on a treadmill or outside   Muscle-Strengthening Exercises These exercises include activities where you move your body, a weight or  some other resistance against gravity. They are also known as resistance exercises and include: Lifting weights  Using elastic exercise bands  Using weight machines  Lifting your own body weight  Functional movements, such as standing and rising up on your toes  Yoga and Pilates can also improve strength, balance and flexibility. However, certain positions may not be safe for people with osteoporosis or those at increased risk of broken bones. For example, exercises that have you bend forward may increase the chance of breaking a bone in the spine.   Non-Impact Exercises There are other types of exercises that can help prevent falls.  Non-impact exercises can help you to improve balance, posture and how well you move in everyday activities. Some of these exercises include: Balance exercises that strengthen your legs and test your balance, such as Tai Chi, can decrease your risk of falls.  Posture exercises that improve your posture and reduce rounded or sloping shoulders can help you decrease the chance of breaking a bone, especially in the spine.  Functional exercises that improve how well you move can help you with everyday activities and decrease your chance of falling and breaking a bone. For example, if you have trouble getting up from a chair or climbing stairs, you should do these activities as exercises.         Care Plan (updated 10/14/2021)  Pre - Diabetes: Lab Results  Component Value Date   HGBA1C 6.2 09/12/2021   Controlled; A1c goal < 6.5% Current treatment: diet and exercise;  Interventions:  Review last A1c and goal Continue to limit intake of high  carbohydrate foods Increase exercise - goal is to get 150 minutes of physical activity per weekly Reminded patient to request copy of diabetic eye exam from Dr Prudencio Burly office  Hypertension: Controlled; blood pressure goal <140/90 BP Readings from Last 3 Encounters:  09/12/21 124/72  06/13/21 114/72  05/24/21 (!) 142/76    Current treatment: Hydrochlorothiazide 246m daily in morning  Interventions:  Discussed blood pressure goals Recommended checking blood pressure once per week   Hyperlipidemia / CAD: Controlled; LDL goal <70 Lipid Panel     Component Value Date/Time   CHOL 144 11/22/2020 1206   TRIG 143.0 11/22/2020 1206   HDL 56.60 11/22/2020 1206   CHOLHDL 3 11/22/2020 1206   VLDL 28.6 11/22/2020 1206   LDLCALC 59 11/22/2020 1206   LDLDIRECT 118.5 08/25/2007 0000   Current treatment: atorvastatin 444mdaily Interventions:  Continue current therapy for lowering risk of recurrent heart disease and to keep LDL <70  Gout:  Current treatment:  Colchicine 0.46m65mp to twice a day as needed  Tart Cherry Capsules 1200m78mily Interventions:  Discussed prevention of gout with diet and Tart Cherry Juice - patient to try TartHarley-Davidsonly and avoid foods that can increase uric acid Continue to limit foods that can increase uric acid - tomatoes, red meat, shellfish and alcohol.  Osteopenia / low bone mass:  Last DEXA 07/04/2021 showed osteopenia Current treatment Calcium + D once a day Multivitamin once daily Has taken alendronate in past 2007 to 2012 Interventions:  Discussed weight bearing exercise - recommend adding 2 to 3 sessions per week Continue to take supplements for bone health - remember to take supplements at least 2 hours before or after levothyroxine.   Patient Goals/Self-Care Activities Over the next 90 days, patient will:  take medications as prescribed check blood pressure weekly, document, and provide at future appointments,  engage in dietary modifications by limiting foods that can increase uric acid levels / gout attacks Increase exercise with goal of 150 minutes per week with 2 to 3 days of weight bearing exercise to help with bone health.   Follow Up Plan: Telephone follow up appointment with care management team member scheduled for:  3 to 4 months    Patient  verbalizes understanding of instructions provided today and agrees to view in [facility]

## 2021-10-14 NOTE — Chronic Care Management (AMB) (Signed)
Chronic Care Management Pharmacy Note  10/14/2021 Name:  Angela Clay MRN:  [id number] DOB:  1933-11-07   Subjective: Angela Clay is an 87 y.o. year old female who is a primary patient of Paz, Alda Berthold, MD.  The CCM team was consulted for assistance with disease management and care coordination needs.    Engaged with patient by telephone for follow up visit in response to provider referral for pharmacy case management and/or care coordination services.   Consent to Services:  The patient was given information about Chronic Care Management services, agreed to services, and gave verbal consent prior to initiation of services.  Please see initial visit note for detailed documentation.   Patient Care Team: Colon Branch, MD as PCP - Cyndia Diver, MD as PCP - Cardiology (Cardiology) Jerrell Belfast, MD as Consulting Physician (Otolaryngology) [name], Amy, MD as Consulting Physician (Dermatology) Katy Apo, MD as Consulting Physician (Ophthalmology) Cherre Robins, [place] (Pharmacist)  Recent Office Visits:  09/12/2021 - Int Med (Dr Larose Kells) Follow up chronic conditions.  Labs checked. Referred to GI in [place] due to [facility] too far per patient. Follow up in 4 months.   Objective:  Lab Results  Component Value Date   CREATININE 0.81 09/12/2021   CREATININE 0.80 02/25/2021   CREATININE 0.84 11/22/2020    Lab Results  Component Value Date   HGBA1C 6.2 09/12/2021   Last diabetic Eye exam:  Lab Results  Component Value Date/Time   HMDIABEYEEXA No Retinopathy 06/22/2020 12:00 AM    Last diabetic Foot exam: No results found for: HMDIABFOOTEX      Component Value Date/Time   CHOL 144 11/22/2020 1206   TRIG 143.0 11/22/2020 1206   HDL 56.60 11/22/2020 1206   CHOLHDL 3 11/22/2020 1206   VLDL 28.6 11/22/2020 1206   LDLCALC 59 11/22/2020 1206   LDLDIRECT 118.5 08/25/2007 0000    Hepatic Function Latest Ref Rng & Units 09/12/2021 02/25/2021 08/07/2020   Total Protein 6.0 - 8.3 g/dL 6.9 7.1 7.7  Albumin 3.5 - 5.2 g/dL 4.1 4.0 3.9  AST 0 - 37 U/L 18 16 23   ALT 0 - 35 U/L 17 19 22   Alk Phosphatase 39 - 117 U/L 82 82 100  Total Bilirubin 0.2 - 1.2 mg/dL 0.8 0.8 0.7  Bilirubin, Direct 0.0 - 0.3 mg/dL - - 0.2    Lab Results  Component Value Date/Time   TSH 0.88 09/12/2021 11:19 AM   TSH 0.65 04/11/2021 09:25 AM    CBC Latest Ref Rng & Units 09/12/2021 11/22/2020 01/10/2020  WBC 4.0 - 10.5 K/uL 5.5 6.8 6.4  Hemoglobin 12.0 - 15.0 g/dL 13.2 13.6 13.0  Hematocrit 36.0 - 46.0 % 39.0 41.2 40.2  Platelets 150.0 - 400.0 K/uL 181.0 207.0 173    Lab Results  Component Value Date/Time   VD25OH 53.41 02/25/2021 11:50 AM   VD25OH 29.08 (L) 11/22/2020 12:06 PM    Clinical ASCVD: Yes  The ASCVD Risk score (Arnett DK, et al., 2019) failed to calculate for the following reasons:   The 2019 ASCVD risk score is only valid for ages 64 to 36    Dexa 07/04/2021 - Ordered by GYN  AP Spine L1-L2 07/04/2021 Osteopenia -1.1  AP Spine L1-L2 05/14/2017  Normal -0.6    DualFemur Neck Left 07/04/2021 Osteopenia -1.8  DualFemur Neck Left 05/14/2017 Osteopenia -1.8    DualFemur Total Left 07/04/2021 Osteopenia -1.8  DualFemur Total Left 05/14/2017 Normal -1.0    DualFemur Total Mean  07/04/2021 Osteopenia -1.4  DualFemur Total Mean 05/14/2017 Normal -0.7   Social History   Tobacco Use  Smoking Status Never  Smokeless Tobacco Never   BP Readings from Last 3 Encounters:  09/12/21 124/72  06/13/21 114/72  05/24/21 (!) 142/76   Pulse Readings from Last 3 Encounters:  09/12/21 75  06/13/21 95  05/24/21 72   Wt Readings from Last 3 Encounters:  09/12/21 162 lb (73.5 kg)  09/02/21 157 lb (71.2 kg)  06/13/21 157 lb (71.2 kg)    Assessment: Review of patient past medical history, allergies, medications, health status, including review of consultants reports, laboratory and other test data, was performed as part of comprehensive evaluation and  provision of chronic care management services.   SDOH:  (Social Determinants of Health) assessments and interventions performed:    CCM Care Plan  Allergies  Allergen Reactions   Codeine Nausea And Vomiting    Medications Reviewed Today     Reviewed by Cherre Robins, RPH-CPP (Pharmacist) on 10/14/21 at 2110  Med List Status: <None>   Medication Order Taking? Sig Documenting Provider Last Dose Status Informant  acetaminophen (TYLENOL) 500 MG tablet [id number] Yes Take 500 mg by mouth every 6 (six) hours as needed for mild pain.  [provider] Taking Active Self           Med Note Leanord Asal, TREVINA M   Tue Jul 20, 2018  3:21 PM)    aspirin 81 MG tablet [id number] Yes Take 81 mg by mouth at bedtime. [provider] Taking Active Self  atorvastatin (LIPITOR) 40 MG tablet [id number] Yes TAKE 1 TABLET BY MOUTH AT BEDTIME Colon Branch, MD Taking Active   Calcium Carb-Cholecalciferol (CALCIUM 600 + D PO) [id number] Yes Take 1 tablet by mouth daily. [provider] Taking Active Self  Colchicine (MITIGARE) 0.6 MG CAPS [id number] Yes Take 1 capsule by mouth 2 (two) times daily as needed. Colon Branch, MD Taking Active            Med Note (CANTER, [place] Sep 12, 2021 10:51 AM) PRN  hydrochlorothiazide (HYDRODIURIL) 25 MG tablet [id number] Yes Take 1 tablet by mouth once daily Colon Branch, MD Taking Active   hydrocortisone 2.5 % cream [id number] Yes  [provider] Taking Active   levothyroxine (SYNTHROID) 100 MCG tablet [id number] Yes Take 1 tablet (100 mcg total) by mouth daily before breakfast. Colon Branch, MD Taking Active   Multiple Vitamins-Minerals (CENTRUM SILVER 50+WOMEN) TABS [id number] Yes Take 1 tablet by mouth daily.  [provider] Taking Active Self  omeprazole (PRILOSEC) 20 MG capsule [id number] Yes Take 1 capsule (20 mg total) by mouth daily as needed.  Patient taking differently: Take 1 capsule by mouth daily.   Colon Branch, MD Taking  Active   Polyethyl Glycol-Propyl Glycol (SYSTANE ULTRA OP) [id number]  Place 1 drop into both eyes daily as needed (Dry eyes). [provider]  Active Self  Tart Cherry [place] [id number] Yes Take 1 capsule by mouth daily. [provider] Taking Active             Patient Active Problem List   Diagnosis Date Noted   Autoimmune hepatitis ([place]) 04/01/2020   Candida esophagitis ([place]) 12/31/2019   Chronic gastritis without bleeding 12/31/2019   Elevated alkaline phosphatase level 10/14/2019   Pancreatic cyst 10/14/2019   Dilated bile duct 10/14/2019   Positive antismooth muscle antibody  08/08/2019   Abnormal ultrasound of  liver 07/07/2019   Abnormal LFTs 07/05/2019   Abnormal SPEP 06/22/2019   Pericarditis 08/09/2018   ACS (acute coronary syndrome) ([place]) 07/19/2018   Shingles 12/04/2015   Follow-up-----------------PCP notes  07/03/2015   Prolapse of female pelvic organs 03/11/2013   Dystrophy, vulva-- s/p bx, on clobetasol prn 03/11/2013   Monoarthritis of hand 03/10/2013   Diabetes mellitus without complication ([place]) 73/71/0626   Annual physical exam 02/19/2011   Carotid arterial disease ([place]) 02/19/2011   DEGENERATIVE JOINT DISEASE 04/26/2010   Hypothyroidism 01/11/2008   LYMPHOMA NEC, MLIG, UNSPECIFIED SITE 08/25/2007   Hyperlipidemia 08/25/2007   Essential hypertension 08/25/2007   Gastroesophageal reflux disease 08/25/2007   Osteopenia 08/25/2007   THYROIDECTOMY, HX OF 08/25/2007    Immunization History  Administered Date(s) Administered   Influenza Split 10/01/2011   Influenza Whole 08/25/2007, 09/08/2008, 08/03/2009, 08/20/2010   Influenza, High Dose Seasonal PF 07/03/2015, 07/07/2016, 07/16/2017, 07/21/2018, 08/04/2019   Influenza, Seasonal, Injecte, Preservative Fre 10/11/2012   Influenza,inj,Quad PF,6+ Mos 10/18/2013, 09/19/2014   Influenza-Unspecified 08/27/2020, 07/05/2021   Moderna Covid-19 Vaccine Bivalent Booster 43yr & up 09/30/2021    Moderna SARS-COV2 Booster Vaccination 08/27/2020, 03/27/2021   Moderna Sars-Covid-2 Vaccination 11/08/2019, 12/05/2019   Pneumococcal Conjugate-13 01/02/2014   Pneumococcal Polysaccharide-23 10/27/2000, 08/25/2007, 01/15/2018   Td 10/27/1997, 08/30/2008   Tdap 08/04/2019   Zoster, Live 03/04/2013    Conditions to be addressed/monitored: CAD, HTN, HLD, and gout; ACS; GERD; hypothyroidism; low vitamin D; pre Diabetes  Care Plan : General Pharmacy (Adult)  Updates made by ECherre Robins RPH-CPP since 10/14/2021 12:00 AM     Problem: Chronic conditions: gout; HTN; CAD; pre diabetes; GERD; hypothyroidism      Long-Range Goal: Provide education, support and care coordination for medication therapy and chronic conditions   Start Date: 08/09/2021  Priority: High  Note:   Current Barriers:  Unable to achieve control of gout  Need for education regarding gout treatment and disease management Need for education on osteopenia and therapeutic lifestyle treatment.  Pharmacist Clinical Goal(s):  Over the next 90 days, patient will achieve control of gout as evidenced by no acute episodes of gout contact provider office for questions/concerns as evidenced notation of same in electronic health record through collaboration with PharmD and provider.   Interventions: 1:1 collaboration with PColon Branch MD regarding development and update of comprehensive plan of care as evidenced by provider attestation and co-signature Inter-disciplinary care team collaboration (see longitudinal plan of care) Comprehensive medication review performed; medication list updated in electronic medical record  Type 2 Diabetes: Controlled; A1c goal < 6.5% Current treatment: diet and exercise;  Interentions:  Review last A1c and goal Continue to limit intake of high carbohydrate foods Increase exercise - goal is to get 150 minutes of physical activity per weekly Reminded patient to request copy of diabetic eye  exam from Dr LPrudencio Burlyoffice  Hypertension: Controlled Current treatment: Hydrochlorothiazide 256mdaily in morning  Patient has failed these meds in the past: None noted Patient checks blood pressure at home infrequently  If she continues to have 1 or 2 acute gout episodes in future might need to consider changing hydrochlorothiazide to ACEI or ARB Denies/reports hypotensive/hypertensive symptoms Interventions:  Discussed gout and blood pressure goals Recommended checking blood pressure once per week   Hyperlipidemia / CAD: Controlled; LDL goal <70 Current treatment: atorvastatin 4052maily Interventions:  Continue current therapy for lowering risk of recurrent heart disease and to keep LDL <70  Gout:  Last acute episode 05/24/2021 - in left hand  Last uric acid 7.1 on 04/11/2021 Current treatment:  Colchicine 0.49m up to twice a day as needed Tart Cherry Capsules 12072mdaily Also has history of pericarditis treated with colchicine Interventions:  Discussed prevention of gout with diet and Tart Cherry Juice - patient to try TaHarley-Davidsonaily and avoid foods that can increase uric acid If she has another episode in the next year, consider either change hydrochlorothiazide which can increase uric acid or consider addition of allopurinol daily as preventative.   Osteopenia:  Last DEXA 07/04/2021 showed osteopenia Current treatment Calcium + D once a day Multivitamin once daily Has taken alendronate in past 2007 to 2012 Interventions:  Discussed weight bearing exercise - recommend adding 2 to 3 sessions per week Continue to take supplements for bone health - reminded her to take supplements at least 2 hours before or after levothyroxine.    Patient Goals/Self-Care Activities Over the next 90 days, patient will:  take medications as prescribed check blood pressure weekly, document, and provide at future appointments,  engage in dietary modifications by limiting foods that  can increase uric acid levels / gout attacks Increase exercise with goal of 150 minutes per week with 2 to 3 days of weight bearing exercise to help with bone health.   Follow Up Plan: Telephone follow up appointment with care management team member scheduled for:  3 to 4 months       Medication Assistance: None required.  Patient affirms current coverage meets needs.  [facility]  [id number]: [phone]: [phone]  Follow Up:  Patient agrees to Care Plan and Follow-up.  Plan: Telephone follow up appointment with care management team member scheduled for:  December 2022  TaCherre RobinsPharmD Clinical Pharmacist [facility]

## 2021-10-26 DIAGNOSIS — E785 Hyperlipidemia, unspecified: Secondary | ICD-10-CM

## 2021-10-26 DIAGNOSIS — E119 Type 2 diabetes mellitus without complications: Secondary | ICD-10-CM | POA: Diagnosis not present

## 2021-10-26 DIAGNOSIS — I10 Essential (primary) hypertension: Secondary | ICD-10-CM | POA: Diagnosis not present

## 2021-10-28 ENCOUNTER — Other Ambulatory Visit: Payer: Self-pay | Admitting: Internal Medicine

## 2021-11-15 ENCOUNTER — Encounter: Payer: Self-pay | Admitting: Internal Medicine

## 2021-11-18 DIAGNOSIS — Z8249 Family history of ischemic heart disease and other diseases of the circulatory system: Secondary | ICD-10-CM | POA: Diagnosis not present

## 2021-11-18 DIAGNOSIS — I10 Essential (primary) hypertension: Secondary | ICD-10-CM | POA: Diagnosis not present

## 2021-11-18 DIAGNOSIS — M810 Age-related osteoporosis without current pathological fracture: Secondary | ICD-10-CM | POA: Diagnosis not present

## 2021-11-18 DIAGNOSIS — K754 Autoimmune hepatitis: Secondary | ICD-10-CM | POA: Diagnosis not present

## 2021-11-18 DIAGNOSIS — M8580 Other specified disorders of bone density and structure, unspecified site: Secondary | ICD-10-CM | POA: Diagnosis not present

## 2021-11-18 DIAGNOSIS — M109 Gout, unspecified: Secondary | ICD-10-CM | POA: Diagnosis not present

## 2021-11-18 DIAGNOSIS — M1990 Unspecified osteoarthritis, unspecified site: Secondary | ICD-10-CM | POA: Diagnosis not present

## 2021-11-18 DIAGNOSIS — R32 Unspecified urinary incontinence: Secondary | ICD-10-CM | POA: Diagnosis not present

## 2021-11-18 DIAGNOSIS — E890 Postprocedural hypothyroidism: Secondary | ICD-10-CM | POA: Diagnosis not present

## 2021-11-18 DIAGNOSIS — E785 Hyperlipidemia, unspecified: Secondary | ICD-10-CM | POA: Diagnosis not present

## 2021-11-18 DIAGNOSIS — K219 Gastro-esophageal reflux disease without esophagitis: Secondary | ICD-10-CM | POA: Diagnosis not present

## 2021-11-18 DIAGNOSIS — Z823 Family history of stroke: Secondary | ICD-10-CM | POA: Diagnosis not present

## 2021-12-02 ENCOUNTER — Other Ambulatory Visit: Payer: Self-pay | Admitting: Internal Medicine

## 2021-12-30 DIAGNOSIS — Z1231 Encounter for screening mammogram for malignant neoplasm of breast: Secondary | ICD-10-CM | POA: Diagnosis not present

## 2021-12-30 LAB — HM MAMMOGRAPHY

## 2022-01-01 ENCOUNTER — Other Ambulatory Visit (INDEPENDENT_AMBULATORY_CARE_PROVIDER_SITE_OTHER): Payer: Medicare HMO

## 2022-01-01 ENCOUNTER — Ambulatory Visit: Payer: Medicare HMO | Admitting: Nurse Practitioner

## 2022-01-01 ENCOUNTER — Encounter: Payer: Self-pay | Admitting: Internal Medicine

## 2022-01-01 ENCOUNTER — Encounter: Payer: Self-pay | Admitting: Nurse Practitioner

## 2022-01-01 VITALS — BP 118/80 | HR 73 | Ht 63.0 in | Wt 163.0 lb

## 2022-01-01 DIAGNOSIS — K754 Autoimmune hepatitis: Secondary | ICD-10-CM

## 2022-01-01 DIAGNOSIS — K862 Cyst of pancreas: Secondary | ICD-10-CM | POA: Diagnosis not present

## 2022-01-01 DIAGNOSIS — R1310 Dysphagia, unspecified: Secondary | ICD-10-CM | POA: Diagnosis not present

## 2022-01-01 LAB — COMPREHENSIVE METABOLIC PANEL
ALT: 18 U/L (ref 0–35)
AST: 20 U/L (ref 0–37)
Albumin: 4.2 g/dL (ref 3.5–5.2)
Alkaline Phosphatase: 87 U/L (ref 39–117)
BUN: 13 mg/dL (ref 6–23)
CO2: 31 mEq/L (ref 19–32)
Calcium: 9.8 mg/dL (ref 8.4–10.5)
Chloride: 99 mEq/L (ref 96–112)
Creatinine, Ser: 0.79 mg/dL (ref 0.40–1.20)
GFR: 67.88 mL/min (ref >60.00)
Glucose, Bld: 112 mg/dL — ABNORMAL HIGH (ref 70–99)
Potassium: 3.5 mEq/L (ref 3.5–5.1)
Sodium: 139 mEq/L (ref 135–145)
Total Bilirubin: 0.7 mg/dL (ref 0.2–1.2)
Total Protein: 7.5 g/dL (ref 6.0–8.3)

## 2022-01-01 NOTE — Patient Instructions (Addendum)
1) CONTACT OUR OFFICE IF YOUR SWALLOWING DIFFICULTY WORSENS  ? ?You will be contacted by [facility] (Your caller ID will indicate phone # [phone]) in the next 7 days to schedule your Swallow test and Abdominal MRI. If you have not heard from them within 7 business days, please call [facility] at [phone] to follow up on the status of your appointment.   ? ?Please proceed to the basement level for lab work before leaving today. Press "B" on the elevator. The lab is located at the first door on the left as you exit the elevator. ? ?HEALTHCARE LAWS AND MY CHART RESULTS:  ? ?Due to recent changes in healthcare laws, you may see results of your imaging and/or laboratory studies on MyChart before I have had a chance to review them.  I understand that in some cases there may be results that are confusing or concerning to you. Please understand that not all results are received at the same time and often I may need to interpret multiple results in order to provide you with the best plan of care or course of treatment. Therefore, I ask that you please give me 48 hours to thoroughly review all your results before contacting my office for clarification.  ? ?Thank you for trusting me with your gastrointestinal care!   ? ?Noralyn Pick, CRNP ? ? ? ?BMI: ? ?If you are age 50 or older, your body mass index should be between 23-30. Your Body mass index is 28.87 kg/m?Marland Kitchen If this is out of the aforementioned range listed, please consider follow up with your Primary Care Provider. ? ?If you are age 86 or younger, your body mass index should be between 19-25. Your Body mass index is 28.87 kg/m?Marland Kitchen If this is out of the aformentioned range listed, please consider follow up with your Primary Care Provider.  ? ?MY CHART: ? ?The [facility] providers would like to encourage you to use [facility] to communicate with providers for non-urgent requests or questions.  Due to long hold times  on the telephone, sending your provider a message by [facility] may be a faster and more efficient way to get a response.  Please allow 48 business hours for a response.  Please remember that this is for non-urgent requests.  ? ?

## 2022-01-01 NOTE — Progress Notes (Signed)
01/01/2022 Angela HARTSHORN [id number] 1934-05-29   Chief Complaint: Follow up autoimmune hepatitis   History of Present Illness: Angela Clay is an 88 year old female with a past medical history of hypertension, hyperlipidemia, pericarditis, hypothyroidism, lymphoma s/p radiation 1970s , thyroid cancer, GERD, diverticulosis, pancreatic cyst and autoimmune hepatitis.  Past cholecystectomy secondary to gallstones.  She was last seen by Dr. Rush Landmark 03/30/2020, her liver biopsies from [place] were concerning for autoimmune hepatitis.  Her LFTs were improving and she was asymptomatic and therefore she did not wish to pursue immunosuppressive therapy.  She complains of having some difficulty swallowing solid foods which occurs once monthly.  She describes having food which gets stuck to the throat to her upper esophagus, she gags and the stuck food comes out.  The last episode occurred last week after eating grits.  She underwent an EGD/EUS 11/23/2019 to follow-up on her known pancreatic cyst.  The EGD component identified multiple gastric fundic gland polyps, a single duodenal polyp and candidiasis esophagitis which was  was treated with Fluconazole 40 mg day 1 and 200 mg days 2 through 14.  She does not recall being diagnosed with esophageal candidiasis and she does not recall her response to the Fluconazole.  However, she thinks her swallowing improved after the procedure but never completely abated.  Gastric biopsies were consistent with mild gastritis, no evidence of H. pylori.  Duodenal biopsies showed peptic duodenitis.  The EUS showed a cystic lesion in the pancreatic body, endosonographic appearance was consistent with a branched intraductal papillary mucinous neoplasm, biopsies were not obtained. She underwent a follow-up abdominal MRI/MRCP 05/30/2020 which showed a stable cystic lesion in the body of the pancreas measuring 11 mm which likely communicates with the pancreatic duct suggestive of a  small sidebranch IPMN.  No worrisome MR imaging features.  A repeat MRI/MRCP was ordered but was not completed.  Labs 09/12/2021: Sodium 139.  Potassium 3.8.  BUN 11.  Creatinine 0.81.  Total bili 0.8.  Alk phos 82.  AST 18.  ALT 17.  WBC 5.5.  Hemoglobin 13.2.  Hematocrit 39.0.  Platelet 181.  Abdominal MRI/MRCP with and without contrast 05/30/2020:  CONTRAST:  71m GADAVIST GADOBUTROL 1 MMOL/ML IV SOLN   COMPARISON:  MRI abdomen 08/29/2019   FINDINGS: Lower chest: The lung bases are clear of an acute process. No worrisome pulmonary lesions. No pleural or pericardial effusion. Stable mild tortuosity and ectasia of the lower thoracic aorta.   Hepatobiliary: No worrisome hepatic lesions are identified. The portal and hepatic veins are patent. Stable mild intra and extrahepatic biliary dilatation likely due to the patient's age and prior cholecystectomy. No common bile duct stones are identified.   Pancreas: Stable small multi septated cystic lesion in the body region of the pancreas. It measures a maximum of 11 mm. It likely communicates with the pancreatic duct and is likely a small side branch IPMN. No worrisome MR imaging features. No evidence of enhancement or nodularity. No dilatation of the pancreatic duct proximal to the lesion. The remainder of the pancreas is unremarkable. No inflammatory changes.   Spleen:  Normal size.  No focal lesions.   Adrenals/Urinary Tract: The adrenal glands and kidneys are unremarkable. Small stable renal cysts.   Stomach/Bowel: The stomach, duodenum, visualized small bowel and visualized colon are unremarkable.   Vascular/Lymphatic: The aorta is normal in caliber. No dissection. The branch vessels are patent. The major venous structures are patent. No mesenteric or retroperitoneal lymphadenopathy.  Other:  No ascites or abdominal wall hernia.   Musculoskeletal: No significant bony findings.   IMPRESSION: 1. Stable small multi septated  cystic lesion in the body region of the pancreas likely a small side branch IPMN. No worrisome MR imaging features. Recommend follow-up MR examination in 2 years. 2. Stable mild intra and extrahepatic biliary dilatation likely due to the patient's age and prior cholecystectomy. No common bile duct stones. 3. Small stable renal cysts.   EGD/EUS 11/23/2019: EGD Impression: - No gross lesions in esophagus proximally. White nummular lesions in esophageal mucosa in mid-esophagus - biopsied. No gross lesions in esophagus distally. Z-line regular, 38 cm from the incisors. - Multiple gastric polyps - likely fundic gland. Biopsied. Erythematous mucosa in the gastric body. No other gross lesions in the stomach. Biopsied for HP. - A single duodenal polyp. Resected and retrieved. No other gross mucosal lesions in the duodenal bulb, in the first portion of the duodenum and in the second portion of the duodenum. Biopsied for Celiac. - Normal major papilla under a hood. EUS Impression: - A cystic lesion was seen in the pancreatic body. Tissue has not been obtained. However, the endosonographic appearance is consistent with a branched intraductal papillary mucinous neoplasm in similar nature to prior MRICP imaging. - There was no other sign of significant pathology in the pancreatic head, genu of the pancreas, pancreatic body and pancreatic tail. - There was a heterogenous echotexture in the visualized portion of the liver. No masses or lesions. - No evidence of choledocholithiasis or ampullary lesion. - No malignant-appearing lymph nodes were visualized in the celiac region (level 20), perigastric region, peripancreatic region and porta hepatis region. SURGICAL PATHOLOGY  CASE: [id number]  PATIENT: Angela Clay  Surgical Pathology Report   FINAL MICROSCOPIC DIAGNOSIS:   A. DUODENUM, BIOPSY:  - Peptic duodenitis.  - No dysplasia or malignancy.   B. DUODENUM, POLYPECTOMY:  - Nodular  peptic duodenitis.  - No dysplasia or malignancy.   C. STOMACH, BIOPSY:  - Mild chronic gastritis with reactive changes.  - Warthin-Starry is negative for Helicobacter pylori.  - No intestinal metaplasia, dysplasia, or malignancy.   D. STOMACH, POLYPECTOMY:  - Fundic gland polyp(s).  - No intestinal metaplasia, dysplasia, or malignancy.   E. ESOPHAGUS, BIOPSY:  - Candida esophagitis.  - PAS highlights fungal organisms.  - No intestinal metaplasia, dysplasia, or malignancy.   Current Outpatient Medications on File Prior to Visit  Medication Sig Dispense Refill   acetaminophen (TYLENOL) 500 MG tablet Take 500 mg by mouth every 6 (six) hours as needed for mild pain.      aspirin 81 MG tablet Take 81 mg by mouth at bedtime.     atorvastatin (LIPITOR) 40 MG tablet TAKE 1 TABLET BY MOUTH AT BEDTIME 90 tablet 1   Calcium Carb-Cholecalciferol (CALCIUM 600 + D PO) Take 1 tablet by mouth daily.     Colchicine (MITIGARE) 0.6 MG CAPS Take 1 capsule by mouth 2 (two) times daily as needed. 60 capsule 0   hydrochlorothiazide (HYDRODIURIL) 25 MG tablet Take 1 tablet by mouth once daily 90 tablet 0   hydrocortisone 2.5 % cream      levothyroxine (SYNTHROID) 100 MCG tablet Take 1 tablet (100 mcg total) by mouth daily before breakfast. 90 tablet 3   Multiple Vitamins-Minerals (CENTRUM SILVER 50+WOMEN) TABS Take 1 tablet by mouth daily.      omeprazole (PRILOSEC) 20 MG capsule Take 1 capsule (20 mg total) by mouth daily as needed. (Patient taking differently:  Take 1 capsule by mouth daily.) 90 capsule 3   Polyethyl Glycol-Propyl Glycol (SYSTANE ULTRA OP) Place 1 drop into both eyes daily as needed (Dry eyes).     Tart Cherry 1200 MG CAPS Take 1 capsule by mouth daily.     No current facility-administered medications on file prior to visit.   Allergies  Allergen Reactions   Codeine Nausea And Vomiting   Current Medications, Allergies, Past Medical History, Past Surgical History, Family History and  Social History were reviewed in [employer] record.  Review of Systems:   Constitutional: Negative for fever, sweats, chills or weight loss.  Respiratory: Negative for shortness of breath.   Cardiovascular: Negative for chest pain, palpitations and leg swelling.  Gastrointestinal: See HPI.  Musculoskeletal: Negative for back pain or muscle aches.  Neurological: Negative for dizziness, headaches or paresthesias.   Physical Exam: BP 118/80    Pulse 73    Ht 5' 3" (1.6 m)    Wt 163 lb (73.9 kg)    SpO2 97%    BMI 28.87 kg/m  Wt Readings from Last 3 Encounters:  01/01/22 163 lb (73.9 kg)  09/12/21 162 lb (73.5 kg)  09/02/21 157 lb (71.2 kg)    General: 88 year old female in no acute distress.  Appears younger than her stated age of 41. Head: Normocephalic and atraumatic. Eyes: No scleral icterus. Conjunctiva pink . Ears: Normal auditory acuity. Mouth: Dentition intact. No ulcers or lesions.  Lungs: Clear throughout to auscultation. Heart: Regular rate and rhythm, no murmur. Abdomen: Soft, nontender and nondistended. No masses or hepatomegaly. Normal bowel sounds x 4 quadrants.  Rectal: Deferred. Musculoskeletal: Symmetrical with no gross deformities. Extremities: No edema. Neurological: Alert oriented x 4. No focal deficits.  Psychological: Alert and cooperative. Normal mood and affect  Assessment and Recommendations:  14) 88 year old female with autoimmune hepatitis, not on immunosuppressive therapy as she remains asymptomatic with normal LFTs -CMP and IgG level  2) History of a 11 mm cystic lesion in the body of the pancreas s/p EUS 11/23/2019 showed a cystic lesion in the pancreatic body, endosonographic appearance was consistent with a branched intraductal papillary mucinous neoplasm. Surveillance abdominal MRI/MRCP 05/30/2020 showed a stable cystic lesion measuring 11 mm to the body of the pancreas. -Abdominal MRI/MRCP with and without contrast   3)  Dysphagia which occurs once monthly with solid foods.  No heartburn. EGD 11/22/2020 identified multiple gastric fundic gland polyps, a single duodenal polyp and candidiasis esophagitis which was  was treated with Fluconazole 40 mg day 1 and 200 mg days 2 through 14.  No recent antibiotic or steroid use. -Barium swallow with tablet -Consider empiric treatment with Fluconazole for possible recurrent candidiasis esophagitis -Repeat EGD if dysphagia persists/worsens or if the barium swallow study shows any esophageal abnormality -Omeprazole 20 mg daily as needed

## 2022-01-01 NOTE — Progress Notes (Signed)
RADIOLOGY SCHEDULING REQUEST FOR Barium Swallow test and abd MRI/MRCP with and without contrast sent to [facility] Scheduling via secure staff message. ?Reminder set to ensure appointment gets scheduled. ? ? ? ?

## 2022-01-02 LAB — IGG: IgG (Immunoglobin G), Serum: 1228 mg/dL (ref 600–1540)

## 2022-01-03 NOTE — Progress Notes (Signed)
Attending Physician's Attestation   I have reviewed the chart.   I agree with the Advanced Practitioner's note, impression, and recommendations with any updates as below.    Uriah Trueba Mansouraty, MD  Gastroenterology Advanced Endoscopy Office # [phone]  

## 2022-01-09 ENCOUNTER — Telehealth: Payer: Medicare HMO | Admitting: Physician Assistant

## 2022-01-09 DIAGNOSIS — U071 COVID-19: Secondary | ICD-10-CM | POA: Diagnosis not present

## 2022-01-09 MED ORDER — MOLNUPIRAVIR EUA 200MG CAPSULE
4.0000 | ORAL_CAPSULE | Freq: Two times a day (BID) | ORAL | 0 refills | Status: AC
Start: 2022-01-09 — End: 2022-01-14

## 2022-01-09 NOTE — Patient Instructions (Signed)
?[name], thank you for joining Mar Daring, PA-C for today's virtual visit.  While this provider is not your primary care provider (PCP), if your PCP is located in our provider database this encounter information will be shared with them immediately following your visit. ? ?Consent: ?(Patient) Angela Clay provided verbal consent for this virtual visit at the beginning of the encounter. ? ?Current Medications: ? ?Current Outpatient Medications:  ?  molnupiravir EUA (LAGEVRIO) 200 mg CAPS capsule, Take 4 capsules (800 mg total) by mouth 2 (two) times daily for 5 days., Disp: 40 capsule, Rfl: 0 ?  acetaminophen (TYLENOL) 500 MG tablet, Take 500 mg by mouth every 6 (six) hours as needed for mild pain. , Disp: , Rfl:  ?  aspirin 81 MG tablet, Take 81 mg by mouth at bedtime., Disp: , Rfl:  ?  atorvastatin (LIPITOR) 40 MG tablet, TAKE 1 TABLET BY MOUTH AT BEDTIME, Disp: 90 tablet, Rfl: 1 ?  Calcium Carb-Cholecalciferol (CALCIUM 600 + D PO), Take 1 tablet by mouth daily., Disp: , Rfl:  ?  Colchicine (MITIGARE) 0.6 MG CAPS, Take 1 capsule by mouth 2 (two) times daily as needed., Disp: 60 capsule, Rfl: 0 ?  hydrochlorothiazide (HYDRODIURIL) 25 MG tablet, Take 1 tablet by mouth once daily, Disp: 90 tablet, Rfl: 0 ?  hydrocortisone 2.5 % cream, , Disp: , Rfl:  ?  levothyroxine (SYNTHROID) 100 MCG tablet, Take 1 tablet (100 mcg total) by mouth daily before breakfast., Disp: 90 tablet, Rfl: 3 ?  Multiple Vitamins-Minerals (CENTRUM SILVER 50+WOMEN) TABS, Take 1 tablet by mouth daily. , Disp: , Rfl:  ?  omeprazole (PRILOSEC) 20 MG capsule, Take 1 capsule (20 mg total) by mouth daily as needed. (Patient taking differently: Take 1 capsule by mouth daily.), Disp: 90 capsule, Rfl: 3 ?  Polyethyl Glycol-Propyl Glycol (SYSTANE ULTRA OP), Place 1 drop into both eyes daily as needed (Dry eyes)., Disp: , Rfl:  ?  Tart Cherry 1200 MG CAPS, Take 1 capsule by mouth daily., Disp: , Rfl:   ? ?Medications ordered in this  encounter:  ?Meds ordered this encounter  ?Medications  ? molnupiravir EUA (LAGEVRIO) 200 mg CAPS capsule  ?  Sig: Take 4 capsules (800 mg total) by mouth 2 (two) times daily for 5 days.  ?  Dispense:  40 capsule  ?  Refill:  0  ?  Order Specific Question:   Supervising Provider  ?  Answer:   Noemi Chapel [[id number]]  ?  ? ?*If you need refills on other medications prior to your next appointment, please contact your pharmacy* ? ?Follow-Up: ?Call back or seek an in-person evaluation if the symptoms worsen or if the condition fails to improve as anticipated. ? ?Other Instructions ? ?COVID-19: Quarantine and Isolation ?Quarantine ?If you were exposed ?Quarantine and stay away from others when you have been in close contact with someone who has COVID-19. ?Isolate ?If you are sick or test positive ?Isolate when you are sick or when you have COVID-19, even if you don't have symptoms. ?When to stay home ?Calculating quarantine ?The date of your exposure is considered day 0. Day 1 is the first full day after your last contact with a person who has had COVID-19. Stay home and away from other people for at least 5 days. Learn why CDC updated guidance for the general public. ?IF YOU were exposed to COVID-19 and are NOT  ?up to dateIF YOU were exposed to COVID-19 and are NOT on COVID-19  vaccinations ?Quarantine for at least 5 days ?Stay home ?Stay home and quarantine for at least 5 full days. ?Wear a well-fitting mask if you must be around others in your home. ?Do not travel. ?Get tested ?Even if you don't develop symptoms, get tested at least 5 days after you last had close contact with someone with COVID-19. ?After quarantine ?Watch for symptoms ?Watch for symptoms until 10 days after you last had close contact with someone with COVID-19. ?Avoid travel ?It is best to avoid travel until a full 10 days after you last had close contact with someone with COVID-19. ?If you develop symptoms ?Isolate immediately and get tested. Continue  to stay home until you know the results. Wear a well-fitting mask around others. ?Take precautions until day 10 ?Wear a well-fitting mask ?Wear a well-fitting mask for 10 full days any time you are around others inside your home or in public. Do not go to places where you are unable to wear a well-fitting mask. ?If you must travel during days 6-10, take precautions. ?Avoid being around people who are more likely to get very sick from COVID-19. ?IF YOU were exposed to COVID-19 and are  ?up to dateIF YOU were exposed to COVID-19 and are on COVID-19 vaccinations ?No quarantine ?You do not need to stay home unless you develop symptoms. ?Get tested ?Even if you don't develop symptoms, get tested at least 5 days after you last had close contact with someone with COVID-19. ?Watch for symptoms ?Watch for symptoms until 10 days after you last had close contact with someone with COVID-19. ?If you develop symptoms ?Isolate immediately and get tested. Continue to stay home until you know the results. Wear a well-fitting mask around others. ?Take precautions until day 10 ?Wear a well-fitting mask ?Wear a well-fitting mask for 10 full days any time you are around others inside your home or in public. Do not go to places where you are unable to wear a well-fitting mask. ?Take precautions if traveling ?Avoid being around people who are more likely to get very sick from COVID-19. ?IF YOU were exposed to COVID-19 and had confirmed COVID-19 within the past 90 days (you tested positive using a viral test) ?No quarantine ?You do not need to stay home unless you develop symptoms. ?Watch for symptoms ?Watch for symptoms until 10 days after you last had close contact with someone with COVID-19. ?If you develop symptoms ?Isolate immediately and get tested. Continue to stay home until you know the results. Wear a well-fitting mask around others. ?Take precautions until day 10 ?Wear a well-fitting mask ?Wear a well-fitting mask for 10 full  days any time you are around others inside your home or in public. Do not go to places where you are unable to wear a well-fitting mask. ?Take precautions if traveling ?Avoid being around people who are more likely to get very sick from COVID-19. ?Calculating isolation ?Day 0 is your first day of symptoms or a positive viral test. Day 1 is the first full day after your symptoms developed or your test specimen was collected. If you have COVID-19 or have symptoms, isolate for at least 5 days. ?IF YOU tested positive for COVID-19 or have symptoms, regardless of vaccination status ?Stay home for at least 5 days ?Stay home for 5 days and isolate from others in your home. ?Wear a well-fitting mask if you must be around others in your home. ?Do not travel. ?Ending isolation if you had symptoms ?End isolation after 5  full days if you are fever-free for 24 hours (without the use of fever-reducing medication) and your symptoms are improving. ?Ending isolation if you did NOT have symptoms ?End isolation after at least 5 full days after your positive test. ?If you got very sick from COVID-19 or have a weakened immune system ?You should isolate for at least 10 days. Consult your doctor before ending isolation. ?Take precautions until day 10 ?Wear a well-fitting mask ?Wear a well-fitting mask for 10 full days any time you are around others inside your home or in public. Do not go to places where you are unable to wear a well-fitting mask. ?Do not travel ?Do not travel until a full 10 days after your symptoms started or the date your positive test was taken if you had no symptoms. ?Avoid being around people who are more likely to get very sick from COVID-19. ?Definitions ?Exposure ?Contact with someone infected with SARS-CoV-2, the virus that causes COVID-19, in a way that increases the likelihood of getting infected with the virus. ?Close contact ?A close contact is someone who was less than 6 feet away from an infected person  (laboratory-confirmed or a clinical diagnosis) for a cumulative total of 15 minutes or more over a 24-hour period. For example, three individual 5-minute exposures for a total of 15 minutes. People who are

## 2022-01-09 NOTE — Progress Notes (Signed)
Based on the information that you have shared in the e-Visit Questionnaire, we recommend that you convert this visit to a video visit in order for the provider to better assess what is going on.  The provider will be able to give you a more accurate diagnosis and treatment plan if we can more freely discuss your symptoms and with the addition of a virtual examination.  ? ?Giving you are COVID positive and considering your medical history and risk factors for complications of COVID, it is recommended you be considered for antivirals. This is something that must be done via video visit (face-to-face encounter).  ? ?If you convert to a video visit, we will bill your insurance (similar to an office visit) and you will not be charged for this e-Visit. You will be able to stay at home and speak with the first available [facility] advanced practice provider. The link to do a video visit is in the drop down Menu tab of your Welcome screen in [place]. ? ? ?

## 2022-01-09 NOTE — Progress Notes (Signed)
?Virtual Visit Consent  ? ?Angela Clay, you are scheduled for a virtual visit with a [facility] provider today.   ?  ?Just as with appointments in the office, your consent must be obtained to participate.  Your consent will be active for this visit and any virtual visit you may have with one of our providers in the next 365 days.   ?  ?If you have a MyChart account, a copy of this consent can be sent to you electronically.  All virtual visits are billed to your insurance company just like a traditional visit in the office.   ? ?As this is a virtual visit, video technology does not allow for your provider to perform a traditional examination.  This may limit your provider's ability to fully assess your condition.  If your provider identifies any concerns that need to be evaluated in person or the need to arrange testing (such as labs, EKG, etc.), we will make arrangements to do so.   ?  ?Although advances in technology are sophisticated, we cannot ensure that it will always work on either your end or our end.  If the connection with a video visit is poor, the visit may have to be switched to a telephone visit.  With either a video or telephone visit, we are not always able to ensure that we have a secure connection.    ? ?I need to obtain your verbal consent now.   Are you willing to proceed with your visit today?  ?  ?LAVENDER STANKE has provided verbal consent on 01/09/2022 for a virtual visit (video or telephone). ?  ?Mar Daring, PA-C  ? ?Date: 01/09/2022 5:31 PM ? ? ?Virtual Visit via Video Note  ? ?[name], connected with  Angela Clay  ([id number], 10-02-1934) on 01/09/22 at  5:15 PM EDT by a video-enabled telemedicine application and verified that I am speaking with the correct person using two identifiers. ? ?Location: ?Patient: Virtual Visit Location Patient: Home ?Provider: Virtual Visit Location Provider: Home Office ?  ?I discussed the limitations of evaluation and management  by telemedicine and the availability of in person appointments. The patient expressed understanding and agreed to proceed.   ? ?History of Present Illness: ?Angela Clay is a 88 y.o. who identifies as a female who was assigned female at birth, and is being seen today for Covid 49. ? ?HPI: URI  ?This is a new problem. The current episode started yesterday (Tested positive for Covid 19 today; symptoms started 01/08/22). The problem has been gradually worsening. There has been no fever. Associated symptoms include congestion, coughing, diarrhea (yesterday afternoon), a plugged ear sensation (popping in left ear), rhinorrhea (post nasal drainage), sinus pain (mild) and a sore throat. Pertinent negatives include no ear pain, headaches, nausea, neck pain, swollen glands or vomiting. Associated symptoms comments: Difficulty sleeping, body felt chilled, TMJ pain . She has tried acetaminophen for the symptoms. The treatment provided mild relief.   ? ? ?Problems:  ?Patient Active Problem List  ? Diagnosis Date Noted  ? Autoimmune hepatitis ([place]) 04/01/2020  ? Candida esophagitis ([place]) 12/31/2019  ? Chronic gastritis without bleeding 12/31/2019  ? Elevated alkaline phosphatase level 10/14/2019  ? Pancreatic cyst 10/14/2019  ? Dilated bile duct 10/14/2019  ? Positive antismooth muscle antibody  08/08/2019  ? Abnormal ultrasound of liver 07/07/2019  ? Abnormal LFTs 07/05/2019  ? Abnormal SPEP 06/22/2019  ? Pericarditis 08/09/2018  ? ACS (acute coronary syndrome) ([place])  07/19/2018  ? Shingles 12/04/2015  ? Follow-up-----------------PCP notes  07/03/2015  ? Prolapse of female pelvic organs 03/11/2013  ? Dystrophy, vulva-- s/p bx, on clobetasol prn 03/11/2013  ? Monoarthritis of hand 03/10/2013  ? Diabetes mellitus without complication ([place]) 34/74/2595  ? Annual physical exam 02/19/2011  ? Carotid arterial disease ([place]) 02/19/2011  ? DEGENERATIVE JOINT DISEASE 04/26/2010  ? Hypothyroidism 01/11/2008  ? LYMPHOMA NEC, MLIG,  UNSPECIFIED SITE 08/25/2007  ? Hyperlipidemia 08/25/2007  ? Essential hypertension 08/25/2007  ? Gastroesophageal reflux disease 08/25/2007  ? Osteopenia 08/25/2007  ? THYROIDECTOMY, HX OF 08/25/2007  ?  ?Allergies:  ?Allergies  ?Allergen Reactions  ? Codeine Nausea And Vomiting  ? ?Medications:  ?Current Outpatient Medications:  ?  molnupiravir EUA (LAGEVRIO) 200 mg CAPS capsule, Take 4 capsules (800 mg total) by mouth 2 (two) times daily for 5 days., Disp: 40 capsule, Rfl: 0 ?  acetaminophen (TYLENOL) 500 MG tablet, Take 500 mg by mouth every 6 (six) hours as needed for mild pain. , Disp: , Rfl:  ?  aspirin 81 MG tablet, Take 81 mg by mouth at bedtime., Disp: , Rfl:  ?  atorvastatin (LIPITOR) 40 MG tablet, TAKE 1 TABLET BY MOUTH AT BEDTIME, Disp: 90 tablet, Rfl: 1 ?  Calcium Carb-Cholecalciferol (CALCIUM 600 + D PO), Take 1 tablet by mouth daily., Disp: , Rfl:  ?  Colchicine (MITIGARE) 0.6 MG CAPS, Take 1 capsule by mouth 2 (two) times daily as needed., Disp: 60 capsule, Rfl: 0 ?  hydrochlorothiazide (HYDRODIURIL) 25 MG tablet, Take 1 tablet by mouth once daily, Disp: 90 tablet, Rfl: 0 ?  hydrocortisone 2.5 % cream, , Disp: , Rfl:  ?  levothyroxine (SYNTHROID) 100 MCG tablet, Take 1 tablet (100 mcg total) by mouth daily before breakfast., Disp: 90 tablet, Rfl: 3 ?  Multiple Vitamins-Minerals (CENTRUM SILVER 50+WOMEN) TABS, Take 1 tablet by mouth daily. , Disp: , Rfl:  ?  omeprazole (PRILOSEC) 20 MG capsule, Take 1 capsule (20 mg total) by mouth daily as needed. (Patient taking differently: Take 1 capsule by mouth daily.), Disp: 90 capsule, Rfl: 3 ?  Polyethyl Glycol-Propyl Glycol (SYSTANE ULTRA OP), Place 1 drop into both eyes daily as needed (Dry eyes)., Disp: , Rfl:  ?  Tart Cherry 1200 MG CAPS, Take 1 capsule by mouth daily., Disp: , Rfl:  ? ?Observations/Objective: ?Patient is well-developed, well-nourished in no acute distress.  ?Resting comfortably at home.  ?Head is normocephalic, atraumatic.  ?No labored  breathing.  ?Speech is clear and coherent with logical content.  ?Patient is alert and oriented at baseline.  ? ? ?Assessment and Plan: ?1. COVID-19 ?- molnupiravir EUA (LAGEVRIO) 200 mg CAPS capsule; Take 4 capsules (800 mg total) by mouth 2 (two) times daily for 5 days.  Dispense: 40 capsule; Refill: 0 ? ?- Continue OTC symptomatic management of choice ?- Will send OTC vitamins and supplement information through AVS ?- Molnupiravir prescribed ?- Patient enrolled in MyChart symptom monitoring ?- Push fluids ?- Rest as needed ?- Discussed return precautions and when to seek in-person evaluation, sent via AVS as well ? ?Follow Up Instructions: ?I discussed the assessment and treatment plan with the patient. The patient was provided an opportunity to ask questions and all were answered. The patient agreed with the plan and demonstrated an understanding of the instructions.  A copy of instructions were sent to the patient via MyChart unless otherwise noted below.  ? ? ?The patient was advised to call back or seek an in-person evaluation  if the symptoms worsen or if the condition fails to improve as anticipated. ? ?Time:  ?I spent 17 minutes with the patient via telehealth technology discussing the above problems/concerns.   ? ?Mar Daring, PA-C ?

## 2022-01-14 ENCOUNTER — Ambulatory Visit (HOSPITAL_COMMUNITY): Admission: RE | Admit: 2022-01-14 | Payer: Medicare HMO | Source: Ambulatory Visit

## 2022-01-16 ENCOUNTER — Ambulatory Visit: Payer: Medicare HMO | Admitting: Internal Medicine

## 2022-01-17 ENCOUNTER — Other Ambulatory Visit (HOSPITAL_COMMUNITY): Payer: Medicare HMO

## 2022-01-21 ENCOUNTER — Ambulatory Visit (INDEPENDENT_AMBULATORY_CARE_PROVIDER_SITE_OTHER): Payer: Medicare HMO | Admitting: Internal Medicine

## 2022-01-21 ENCOUNTER — Ambulatory Visit: Payer: Medicare HMO | Admitting: Internal Medicine

## 2022-01-21 ENCOUNTER — Encounter: Payer: Self-pay | Admitting: Internal Medicine

## 2022-01-21 VITALS — BP 142/86 | HR 83 | Temp 98.2°F | Resp 18 | Ht 63.0 in | Wt 162.5 lb

## 2022-01-21 DIAGNOSIS — R69 Illness, unspecified: Secondary | ICD-10-CM | POA: Diagnosis not present

## 2022-01-21 DIAGNOSIS — F439 Reaction to severe stress, unspecified: Secondary | ICD-10-CM

## 2022-01-21 DIAGNOSIS — E039 Hypothyroidism, unspecified: Secondary | ICD-10-CM

## 2022-01-21 DIAGNOSIS — E785 Hyperlipidemia, unspecified: Secondary | ICD-10-CM

## 2022-01-21 LAB — LIPID PANEL
Cholesterol: 133 mg/dL (ref 0–200)
HDL: 50.8 mg/dL (ref >39.00)
LDL Cholesterol: 51 mg/dL (ref 0–99)
NonHDL: 81.98
Total CHOL/HDL Ratio: 3
Triglycerides: 157 mg/dL — ABNORMAL HIGH (ref 0.0–149.0)
VLDL: 31.4 mg/dL (ref 0.0–40.0)

## 2022-01-21 LAB — TSH: TSH: 1.56 u[IU]/mL (ref 0.35–5.50)

## 2022-01-21 NOTE — Progress Notes (Signed)
? ?Subjective:  ? ? Patient ID: Angela Clay, female    DOB: 1934/09/12, 88 y.o.   MRN: [id number] ? ?DOS:  01/21/2022 ?Type of visit - description: ROV ? ?Routine follow-up ?Since the last visit was diagnosed with COVID about 12 days ago, she was prescribed molnupiravir, finished the 5 day supply, she feels essentially fully recuperated. ?Currently with no fever chills.  No cough. ? ?Also, her husband developed COVID and he is actually still in the hospital, very ill. ?She is obviously concerned and sad about her husband who is my patient. ? ?Autoimmune hepatitis: Chart reviewed. ? ?Review of Systems ?See above  ? ?Past Medical History:  ?Diagnosis Date  ? Actinic keratosis 05/2015  ? Right mid central dorsal hand  ? Autoimmune hepatitis ([place])   ? Gallstones   ? GERD (gastroesophageal reflux disease)   ? EGD for dysphagia 2000 aprox (-)  ? History of radiation therapy   ? Hyperlipidemia   ? Hypertension   ? Hypothyroidism   ? Lymphoma ([place])   ? neck,mlig,unspecified site dx 1970s  ? OA (osteoarthritis)   ? severe at neck  ? Osteopenia   ? Pancreatic cyst   ? Syncope 11/2012  ? admited . w/u (-)  ? Thyroid cancer ([place])   ? ? ?Past Surgical History:  ?Procedure Laterality Date  ? APPENDECTOMY    ? BELPHAROPTOSIS REPAIR Bilateral 2017  ? BIOPSY  11/23/2019  ? Procedure: BIOPSY;  Surgeon: Irving Copas., MD;  Location: [facility];  Service: Gastroenterology;;  ? CATARACT EXTRACTION, BILATERAL Bilateral 01/10/2019  ? Dr.Rankin  ? CHOLECYSTECTOMY  02/24/2006  ? ESOPHAGOGASTRODUODENOSCOPY (EGD) WITH PROPOFOL N/A 11/23/2019  ? Procedure: ESOPHAGOGASTRODUODENOSCOPY (EGD) WITH PROPOFOL;  Surgeon: Rush Landmark Telford Nab., MD;  Location: [facility];  Service: Gastroenterology;  Laterality: N/A;  ? EXPLORATORY LAPAROTOMY    ? for lymphadenomopathy; appendix removed at this time  ? LEFT HEART CATH AND CORONARY ANGIOGRAPHY N/A 07/20/2018  ? Procedure: LEFT HEART CATH AND CORONARY ANGIOGRAPHY;  Surgeon: Belva Crome, MD;  Location: [facility];  Service: Cardiovascular;  Laterality: N/A;  ? LIVER BIOPSY    ? THYROIDECTOMY    ? TONSILLECTOMY AND ADENOIDECTOMY  1949  ? TUBAL LIGATION  1972  ? UPPER ESOPHAGEAL ENDOSCOPIC ULTRASOUND (EUS) N/A 11/23/2019  ? Procedure: UPPER ESOPHAGEAL ENDOSCOPIC ULTRASOUND (EUS);  Surgeon: Irving Copas., MD;  Location: [facility];  Service: Gastroenterology;  Laterality: N/A;  WITH BIOPSY   NEEDS 90 MINUTES  ? vitamacular hole surgery Right 2020  ? eye  ? ? ?Current Outpatient Medications  ?Medication Instructions  ? acetaminophen (TYLENOL) 500 mg, Oral, Every 6 hours PRN  ? aspirin 81 mg, Oral, Daily at bedtime,    ? atorvastatin (LIPITOR) 40 MG tablet TAKE 1 TABLET BY MOUTH AT BEDTIME  ? Calcium Carb-Cholecalciferol (CALCIUM 600 + D PO) 1 tablet, Oral, Daily  ? Colchicine (MITIGARE) 0.6 MG CAPS 1 capsule, Oral, 2 times daily PRN  ? hydrochlorothiazide (HYDRODIURIL) 25 MG tablet Take 1 tablet by mouth once daily  ? hydrocortisone 2.5 % cream   ? levothyroxine (SYNTHROID) 100 mcg, Oral, Daily before breakfast  ? Multiple Vitamins-Minerals (CENTRUM SILVER 50+WOMEN) TABS 1 tablet, Oral, Daily  ? omeprazole (PRILOSEC) 20 mg, Oral, Daily PRN  ? Polyethyl Glycol-Propyl Glycol (SYSTANE ULTRA OP) 1 drop, Both Eyes, Daily PRN  ? Tart Cherry 1200 MG CAPS 1 capsule, Oral, Daily  ? ? ?   ?Objective:  ? Physical Exam ?BP (!) 142/86 (BP  Location: Left Arm, Patient Position: Sitting, Cuff Size: Small)   Pulse 83   Temp 98.2 ?F (36.8 ?C) (Oral)   Resp 18   Ht 5' 3"  (1.6 m)   Wt 162 lb 8 oz (73.7 kg)   SpO2 96%   BMI 28.79 kg/m?  ?General:   ?Well developed, NAD, BMI noted. ?HEENT:  ?Normocephalic . Face symmetric, atraumatic ?Lungs:  ?CTA B ?Normal respiratory effort, no intercostal retractions, no accessory muscle use. ?Heart: RRR,  no murmur.  ?Lower extremities: no pretibial edema bilaterally  ?Skin: Not pale. Not jaundice ?Neurologic:  ?alert & oriented X3.  ?Speech normal, gait  appropriate for age and unassisted ?Psych--  ?Cognition and judgment appear intact.  ?Cooperative with normal attention span and concentration.  ?Behavior appropriate. ?Emotional when we talk about her husband ?   ?Assessment   ? ? Assessment ?DM A1c 6.8 (04-2019) ?Hyperlipidemia ?HTN ?Hypothyroidism ?Gout  ?Autoimmune hepatitis: ?Carotid artery disease ?[place] 11-2014: ?60-79% RICA stenosis. ?50-35% LICA stenosis ?[place] 02/2016: 1-39 % B. 1 year ?[place] 06/2017 1-39%, no further routine [place] ?DJD, severe at the neck ?GERD, dysphagia remotely ?Osteopenia: ?-s/p fosamax 2007 to 2012. ?-T score -1.7 (01-2015)  ?-T score -1.8 (04-2017) rx ca- vitamin D ?Lymphoma,  Neck, 1970s ?Syncope, admitted-2014, workup negative ?Shingles: 10/02/2016, left arm ?Chest pain: Cath 07/19/2018, negative, pericarditis? ? ?PLAN: ?Hypothyroidism: On Synthroid, check labs, adjust medicine as needed ?COVID 19 infection: Diagnosed 12 days ago, had molnupiravir, feels fully recuperated. ?High cholesterol: On atorvastatin 40 mg, check FLP.  GI has not recommended to stop statins ?Autoimmune hepatitis: Closely follow-up by GI, has a pancreatic lesion, MRI/MRCP follow-up pending.  Also some dysphagia, was recommended barium swallow.  Pending. ?Stress: Related to her husband's health, we had a long conversation about it, counseled about potential for caregiver fatigue.  Listening therapy provided. ?RTC 4 to 5 months ?  ? ?This visit occurred during the SARS-CoV-2 public health emergency.  Safety protocols were in place, including screening questions prior to the visit, additional usage of staff PPE, and extensive cleaning of exam room while observing appropriate contact time as indicated for disinfecting solutions.  ? ?

## 2022-01-21 NOTE — Assessment & Plan Note (Signed)
Hypothyroidism: On Synthroid, check labs, adjust medicine as needed ?COVID 19 infection: Diagnosed 12 days ago, had molnupiravir, feels fully recuperated. ?High cholesterol: On atorvastatin 40 mg, check FLP.  GI has not recommended to stop statins ?Autoimmune hepatitis: Closely follow-up by GI, has a pancreatic lesion, MRI/MRCP follow-up pending.  Also some dysphagia, was recommended barium swallow.  Pending. ?Stress: Related to her husband's health, we had a long conversation about it, counseled about potential for caregiver fatigue.  Listening therapy provided. ?RTC 4 to 5 months ?  ?

## 2022-01-21 NOTE — Patient Instructions (Addendum)
Please reach out if you have any questions or concerns about your health or Angela Clay's health ? ?GO TO THE LAB : Get the blood work   ? ? ?[place], [place] ?Come back for   a checkup in 4 to 5 months ?

## 2022-01-24 ENCOUNTER — Telehealth: Payer: Self-pay

## 2022-01-24 NOTE — Telephone Encounter (Signed)
The appts have been cancelled and recall reminder sent to contact the pt in 2 months.  ?

## 2022-01-24 NOTE — Telephone Encounter (Signed)
-----   Message from Irving Copas., MD sent at 01/24/2022  1:20 AM EDT ----- ?Regarding: Follow-up ?Michelle Wnek, ?This patient's husband has been in the hospital and she feels that she is unable to perform the previously noted MRI and barium swallow that were discussed with Jaclyn Shaggy a few weeks ago.  I believe she is scheduled in the next couple of weeks.  Please cancel those orders for now and put in a recall for approximately 2 months from now.  She said she would call and over the course of the next few weeks to reschedule these but does not think that she can do these in the current status of where her husband is from a medical standpoint. ?Thanks. ?GM ? ?

## 2022-01-25 ENCOUNTER — Other Ambulatory Visit: Payer: Self-pay | Admitting: Internal Medicine

## 2022-01-27 ENCOUNTER — Encounter: Payer: Self-pay | Admitting: Internal Medicine

## 2022-01-31 ENCOUNTER — Other Ambulatory Visit (HOSPITAL_COMMUNITY): Payer: Medicare HMO

## 2022-02-04 ENCOUNTER — Other Ambulatory Visit (HOSPITAL_COMMUNITY): Payer: Medicare HMO

## 2022-02-11 ENCOUNTER — Ambulatory Visit (INDEPENDENT_AMBULATORY_CARE_PROVIDER_SITE_OTHER): Payer: Medicare HMO | Admitting: Pharmacist

## 2022-02-11 DIAGNOSIS — F439 Reaction to severe stress, unspecified: Secondary | ICD-10-CM

## 2022-02-11 DIAGNOSIS — E785 Hyperlipidemia, unspecified: Secondary | ICD-10-CM

## 2022-02-11 DIAGNOSIS — M109 Gout, unspecified: Secondary | ICD-10-CM

## 2022-02-11 DIAGNOSIS — I10 Essential (primary) hypertension: Secondary | ICD-10-CM

## 2022-02-11 NOTE — Chronic Care Management (AMB) (Signed)
? ? ?Chronic Care Management ?Pharmacy Note ? ?02/16/2022 ?Name:  Angela Clay MRN:  [id number] DOB:  05-29-1934 ? ?Summary:  ?Patient is doing well with the exception of not sleeping well recently. She lost her husband Angela Clay 01/23/2022 and sleep schedule was off while she stayed with him in the hospital. She has ordered melatonin to try and had questions about it. Discussed melatonin. Recommended start with melatonin 79m qhs as needed for 1 week, if sleep not better she can increase to 173mqhs. If no improvement in 2 weeks, then contact office for appointment with PCP to discuss.  ?Reviewed refill history patient has filled all maintenance medications on time so far in 2023.  ?Blood pressure at home recently 136/72 and 119/80  - at goal  ?No recent acute gout episodes. Continues to take tart cherry tablets daily.  ?Reminded to rescheduled MRI of abdomin for cyst on pancreas and also barium swallow study per GI recommendations.  ?Last LDL was at goal but triglycerides a little elevated. Discussed limiting sweets and other foods high in carbohydrates. Patient also encouraged to restart exercise.  ? ?Subjective: ?Angela M BRITTNIE LEWEYs an [age].o. year old female who is a primary patient of Angela Clay, Angela BertholdMD.  The CCM team was consulted for assistance with disease management and care coordination needs.   ? ?Engaged with patient by telephone for follow up visit in response to provider referral for pharmacy case management and/or care coordination services.  ? ?Consent to Services:  ?The patient was given information about Chronic Care Management services, agreed to services, and gave verbal consent prior to initiation of services.  Please see initial visit note for detailed documentation.  ? ?Patient Care Team: ?PaColon BranchMD as PCP - General ?CoSherren MochaMD as PCP - Cardiology (Cardiology) ?ShJerrell BelfastMD as Consulting Physician (Otolaryngology) ?JoMartiniqueAmy, MD as Consulting Physician (Dermatology) ?LyKaty ApoMD as Consulting Physician (Ophthalmology) ?EcCherre RobinsRPH-CPP (Pharmacist) ? ?Recent Office Visits:  ?01/21/2022 - Int Med (Dr PaLarose Kellsfollow up. Labs checked. No med changes noted.  ?09/12/2021 - Int Med (Dr PaLarose KellsFollow up chronic conditions.  Labs checked. [place] [facility] [place]. Follow up in 4 months. ? ?Recent Consult Visits:  ?[facility]20[facility] Positive COVID test. Prescribed molnupiravir 80037mwice a day for 5 days.  ?01/01/2022 - GI (KeBerniece PapP) F/U autoimmunie hepatitis. Checked labs and IgG level (was WNL) . Ordered abdominal MRI to check cystic lysion on pancreas. Ordered barium swallow testing due to dysphagia. ALso prescribed fluconazole for possible candidiasis esophagitis. Continue omeprazole 77m52mily if needed. ?  ? ?Objective: ? ?Lab Results  ?Component Value Date  ? CREATININE 0.79 01/01/2022  ? CREATININE 0.81 09/12/2021  ? CREATININE 0.80 02/25/2021  ? ? ?Lab Results  ?Component Value Date  ? HGBA1C 6.2 09/12/2021  ? ?Last diabetic Eye exam:  ?Lab Results  ?Component Value Date/Time  ? HMDIABEYEEXA No Retinopathy 07/18/2021 12:00 AM  ?  ?Last diabetic Foot exam: No results found for: HMDIABFOOTEX  ? ?   ?Component Value Date/Time  ? CHOL 133 01/21/2022 1045  ? TRIG 157.0 (H) 01/21/2022 1045  ? HDL 50.80 01/21/2022 1045  ? CHOLHDL 3 01/21/2022 1045  ? VLDL 31.4 01/21/2022 1045  ? LDLCALC 51 01/21/2022 1045  ? LDLDIRECT 118.5 08/25/2007 0000  ? ? ? ?  Latest Ref Rng & Units 01/01/2022  ?  2:49 PM  09/12/2021  ? 11:19 AM 02/25/2021  ? 11:50 AM  ?Hepatic Function  ?Total Protein 6.0 - 8.3 g/dL 7.5   6.9   7.1    ?Albumin 3.5 - 5.2 g/dL 4.2   4.1   4.0    ?AST 0 - 37 U/L _0 ?ALT 0 - 35 U/L _1 ?Alk Phosphatase 39 - 117 U/L 87   82   82    ?Total Bilirubin 0.2 - 1.2 mg/dL 0.7   0.8   0.8    ? ? ?Lab Results  ?Component Value Date/Time  ? TSH 1.56 01/21/2022 10:45 AM  ? TSH 0.88  09/12/2021 11:19 AM  ? ? ? ?  Latest Ref Rng & Units 09/12/2021  ? 11:19 AM 11/22/2020  ? 12:06 PM 01/10/2020  ?  6:18 AM  ?CBC  ?WBC 4.0 - 10.5 K/uL 5.5   6.8   6.4    ?Hemoglobin 12.0 - 15.0 g/dL 13.2   13.6   13.0    ?Hematocrit 36.0 - 46.0 % 39.0   41.2   40.2    ?Platelets 150.0 - 400.0 K/uL 181.0   207.0   173    ? ? ?Lab Results  ?Component Value Date/Time  ? VD25OH 53.41 02/25/2021 11:50 AM  ? VD25OH 29.08 (L) 11/22/2020 12:06 PM  ? ? ?Clinical ASCVD: Yes  ?The ASCVD Risk score (Arnett DK, et al., 2019) failed to calculate for the following reasons: ?  The 2019 ASCVD risk score is only valid for ages 64 to 61   ? ?Dexa 07/04/2021 - Ordered by GYN  ?AP Spine L1-L2 07/04/2021 Osteopenia -1.1  ?AP Spine L1-L2 05/14/2017  Normal -0.6  ?  ?DualFemur Neck Left 07/04/2021 Osteopenia -1.8  ?DualFemur Neck Left 05/14/2017 Osteopenia -1.8  ?  ?DualFemur Total Left 07/04/2021 Osteopenia -1.8  ?DualFemur Total Left 05/14/2017 Normal -1.0  ?  ?DualFemur Total Mean 07/04/2021 Osteopenia -1.4  ?DualFemur Total Mean 05/14/2017 Normal -0.7  ? ?Social History  ? ?Tobacco Use  ?Smoking Status Never  ?Smokeless Tobacco Never  ? ?BP Readings from Last 3 Encounters:  ?01/21/22 (!) 142/86  ?01/01/22 118/80  ?09/12/21 124/72  ? ?Pulse Readings from Last 3 Encounters:  ?01/21/22 83  ?01/01/22 73  ?09/12/21 75  ? ?Wt Readings from Last 3 Encounters:  ?01/21/22 162 lb 8 oz (73.7 kg)  ?01/01/22 163 lb (73.9 kg)  ?09/12/21 162 lb (73.5 kg)  ? ? ?Assessment: Review of patient past medical history, allergies, medications, health status, including review of consultants reports, laboratory and other test data, was performed as part of comprehensive evaluation and provision of chronic care management services.  ? ?SDOH:  (Social Determinants of Health) assessments and interventions performed:  ? ? ?CCM Care Plan ? ?Allergies  ?Allergen Reactions  ? Codeine Nausea And Vomiting  ? ? ?Medications Reviewed Today   ? ? Reviewed by Cherre Robins,  RPH-CPP (Pharmacist) on 02/11/22 at 1145  Med List Status: <None>  ? ?Medication Order Taking? Sig Documenting Provider Last Dose Status Informant  ?acetaminophen (TYLENOL) 500 MG tablet [id number] Yes Take 500 mg by mouth every 6 (six) hours as needed for mild pain.  [provider] Taking Active Self  ?         ?Med Note Leanord Asal, TREVINA M   Tue Jul 20, 2018  3:21 PM)    ?aspirin 81 MG tablet [id number] Yes Take  81 mg by mouth at bedtime. [provider] Taking Active Self  ?atorvastatin (LIPITOR) 40 MG tablet [id number] Yes TAKE 1 TABLET BY MOUTH AT BEDTIME Angela Branch, MD Taking Active   ?Calcium Carb-Cholecalciferol (CALCIUM 600 + D PO) [id number] Yes Take 1 tablet by mouth daily. [provider] Taking Active Self  ?Colchicine (MITIGARE) 0.6 MG CAPS [id number] Yes Take 1 capsule by mouth 2 (two) times daily as needed. Angela Branch, MD Taking Active   ?         ?Med Note (CANTER, Lindajo Royal Sep 12, 2021 10:51 AM) PRN  ?hydrochlorothiazide (HYDRODIURIL) 25 MG tablet [id number] Yes Take 1 tablet by mouth once daily Angela Branch, MD Taking Active   ?hydrocortisone 2.5 % cream [id number] No   ?Patient not taking: Reported on 02/11/2022  ? [provider] Not Taking Active   ?levothyroxine (SYNTHROID) 100 MCG tablet [id number] Yes Take 1 tablet (100 mcg total) by mouth daily before breakfast. Angela Branch, MD Taking Active   ?Multiple Vitamins-Minerals (CENTRUM SILVER 50+WOMEN) TABS [id number] Yes Take 1 tablet by mouth daily.  [provider] Taking Active Self  ?omeprazole (PRILOSEC) 20 MG capsule [id number] Yes Take 1 capsule (20 mg total) by mouth daily as needed.  ?Patient taking differently: Take 1 capsule by mouth daily.  ? Angela Branch, MD Taking Active   ?Polyethyl Glycol-Propyl Glycol (SYSTANE ULTRA OP) [id number] Yes Place 1 drop into both eyes daily as needed (Dry eyes). [provider] Taking Active Self  ?Tart Cherry 1200 MG CAPS [id number] Yes Take 1 capsule by  mouth daily. [provider] Taking Active   ? ?  ?  ? ?  ? ? ?Patient Active Problem List  ? Diagnosis Date Noted  ? Autoimmune hepatitis ([place]) 04/01/2020  ? Candida esophagitis ([place]) 12/31/2019  ? Chroni

## 2022-02-16 NOTE — Patient Instructions (Addendum)
Mrs. Barrell ?It was a pleasure speaking with you  ?Below is a summary of your health goals and care plan ? ?Patient Goals/Self-Care Activities ?take medications as prescribed ?check blood pressure weekly, document, and provide at future appointments,  ?engage in dietary modifications by limiting foods that can increase uric acid levels / gout attacks (tomatoes, shell fish, alcohol, red meat, sweets) ?Restart exercise with goal of 150 minutes per week with 2 to 3 days of weight bearing exercise to help with bone health.  ?Rescheduled MRI of abdomin for cyst on pancreas and also barium swallow study per GI recommendations.  ?Can start with melatonin 55m prior to bedtime as needed for 1 week, if sleep not improved increase to 17mprior to bedtime. If no improvement in 2 weeks, then contact office for appointment with PCP to discuss. ? ? ?If you have any questions or concerns, please feel free to contact me either at the phone number below or with a MyChart message.  ? ?Keep up the good work! ? ?TaCherre RobinsPharmD ?Clinical Pharmacist ?[facility] ?[place] ?[phone] line)  ?[phone] office number) ? ? ?Pre - Diabetes: ?Lab Results  ?Component Value Date  ? HGBA1C 6.2 09/12/2021  ? ?Controlled; A1c goal < 6.5% ?Current treatment: diet and exercise;  ?Interventions:  ?Review last A1c and goal ?Continue to limit intake of high carbohydrate foods ?Increase exercise - goal is to get 150 minutes of physical activity per weekly ?Reminded patient to have annual diabetic eye exam  ? ?Hypertension: ?Controlled; blood pressure goal <140/90 ?BP Readings from Last 3 Encounters:  ?01/21/22 (!) 142/86  ?01/01/22 118/80  ?09/12/21 124/72  ? ?Current treatment: ?Hydrochlorothiazide 2536maily in morning  ?Interventions:  ?Discussed blood pressure goals ?Recommended checking blood pressure once per week  ? ?Hyperlipidemia / CAD: ?Controlled; LDL goal <70 ?Lipid Panel  ?   ?Component  Value Date/Time  ? CHOL 133 01/21/2022 1045  ? TRIG 157.0 (H) 01/21/2022 1045  ? HDL 50.80 01/21/2022 1045  ? CHOLHDL 3 01/21/2022 1045  ? VLDL 31.4 01/21/2022 1045  ? LDLCALC 51 01/21/2022 1045  ? LDLDIRECT 118.5 08/25/2007 0000  ? ?Current treatment: atorvastatin 33m33mily ?Interventions:  ?Continue current therapy for lowering risk of recurrent heart disease and to keep LDL <70 ? ?Gout:  ?Current treatment:  ?Colchicine 0.6mg 32mto twice a day as needed  ?Tart Cherry Capsules 1200mg 78my ?Interventions:  ?Continue Tart Cherry Juice / capsules daily and avoid foods that can increase uric acid ?Continue to limit foods that can increase uric acid - tomatoes, red meat, shellfish, sweets and alcohol. ? ?Osteopenia / low bone mass:  ?Last DEXA 07/04/2021 showed osteopenia ?Current treatment ?Calcium + D once a day ?Multivitamin once daily ?Has taken alendronate in past 2007 to 2012 ?Interventions:  ?Discussed weight bearing exercise - recommend adding 2 to 3 sessions per week ?Continue to take supplements for bone health - remember to take supplements at least 2 hours before or after levothyroxine.  ? ? ?Patient Goals/Self-Care Activities ?Over the next 90 days, patient will:  ?take medications as prescribed ?check blood pressure weekly, document, and provide at future appointments,  ?engage in dietary modifications by limiting foods that can increase uric acid levels / gout attacks (tomatoes, shell fish, alcohol, red meat, sweets) ?Restart exercise with goal of 150 minutes per week with 2 to 3 days of weight bearing exercise to help with bone health.  ?Rescheduled MRI of abdomin for cyst on pancreas  and also barium swallow study per GI recommendations.  ?Can start with melatonin 35m prior to bedtime as needed for 1 week, if sleep not improved increase to 159mprior to bedtime. If no improvement in 2 weeks, then contact office for appointment with PCP to discuss. ? ?Follow Up Plan: Telephone follow up appointment  with care management team member scheduled for:  3 to 4 months  ? ?Patient verbalizes understanding of instructions and care plan provided today and agrees to view in [facility] MyChart status confirmed with patient.    ?

## 2022-02-23 DIAGNOSIS — I2510 Atherosclerotic heart disease of native coronary artery without angina pectoris: Secondary | ICD-10-CM

## 2022-02-23 DIAGNOSIS — E785 Hyperlipidemia, unspecified: Secondary | ICD-10-CM

## 2022-02-23 DIAGNOSIS — I10 Essential (primary) hypertension: Secondary | ICD-10-CM | POA: Diagnosis not present

## 2022-02-23 DIAGNOSIS — E1159 Type 2 diabetes mellitus with other circulatory complications: Secondary | ICD-10-CM | POA: Diagnosis not present

## 2022-02-23 DIAGNOSIS — M8580 Other specified disorders of bone density and structure, unspecified site: Secondary | ICD-10-CM | POA: Diagnosis not present

## 2022-03-19 DIAGNOSIS — L570 Actinic keratosis: Secondary | ICD-10-CM | POA: Diagnosis not present

## 2022-03-19 DIAGNOSIS — D485 Neoplasm of uncertain behavior of skin: Secondary | ICD-10-CM | POA: Diagnosis not present

## 2022-03-19 DIAGNOSIS — D045 Carcinoma in situ of skin of trunk: Secondary | ICD-10-CM | POA: Diagnosis not present

## 2022-03-26 ENCOUNTER — Telehealth: Payer: Self-pay

## 2022-03-26 DIAGNOSIS — K862 Cyst of pancreas: Secondary | ICD-10-CM

## 2022-03-26 DIAGNOSIS — K754 Autoimmune hepatitis: Secondary | ICD-10-CM

## 2022-03-26 NOTE — Telephone Encounter (Signed)
The pt has agreed to have the MRI but declines the barium swallow.  I have sent the order to the schedulers to call the pt and set up.    FYI Colleen see the note above, the pt declines barium swallow.

## 2022-03-26 NOTE — Telephone Encounter (Signed)
Noted  

## 2022-03-26 NOTE — Telephone Encounter (Signed)
-----   Message from Timothy Lasso, RN sent at 01/24/2022 10:57 AM EDT ----- MRI and barium swallow see 3/31 note

## 2022-04-11 ENCOUNTER — Other Ambulatory Visit: Payer: Self-pay | Admitting: Nurse Practitioner

## 2022-04-11 ENCOUNTER — Ambulatory Visit (HOSPITAL_COMMUNITY)
Admission: RE | Admit: 2022-04-11 | Discharge: 2022-04-11 | Disposition: A | Discharge status: Home / Self Care | Payer: Medicare HMO | Source: Ambulatory Visit | Attending: Nurse Practitioner | Admitting: Nurse Practitioner

## 2022-04-11 DIAGNOSIS — K754 Autoimmune hepatitis: Secondary | ICD-10-CM | POA: Diagnosis not present

## 2022-04-11 DIAGNOSIS — I700 Atherosclerosis of aorta: Secondary | ICD-10-CM | POA: Diagnosis not present

## 2022-04-11 DIAGNOSIS — K862 Cyst of pancreas: Secondary | ICD-10-CM | POA: Diagnosis not present

## 2022-04-11 DIAGNOSIS — N281 Cyst of kidney, acquired: Secondary | ICD-10-CM | POA: Diagnosis not present

## 2022-04-11 DIAGNOSIS — R935 Abnormal findings on diagnostic imaging of other abdominal regions, including retroperitoneum: Secondary | ICD-10-CM | POA: Diagnosis not present

## 2022-04-11 MED ORDER — GADOBUTROL 1 MMOL/ML IV SOLN
7.0000 mL | Freq: Once | INTRAVENOUS | Status: AC | PRN
  Administered 2022-04-11: 7 mL via INTRAVENOUS

## 2022-04-12 ENCOUNTER — Other Ambulatory Visit: Payer: Self-pay | Admitting: Internal Medicine

## 2022-04-14 DIAGNOSIS — L578 Other skin changes due to chronic exposure to nonionizing radiation: Secondary | ICD-10-CM | POA: Diagnosis not present

## 2022-04-14 DIAGNOSIS — I872 Venous insufficiency (chronic) (peripheral): Secondary | ICD-10-CM | POA: Diagnosis not present

## 2022-04-14 DIAGNOSIS — D225 Melanocytic nevi of trunk: Secondary | ICD-10-CM | POA: Diagnosis not present

## 2022-04-14 DIAGNOSIS — I8312 Varicose veins of left lower extremity with inflammation: Secondary | ICD-10-CM | POA: Diagnosis not present

## 2022-04-14 DIAGNOSIS — L720 Epidermal cyst: Secondary | ICD-10-CM | POA: Diagnosis not present

## 2022-04-14 DIAGNOSIS — L570 Actinic keratosis: Secondary | ICD-10-CM | POA: Diagnosis not present

## 2022-04-14 DIAGNOSIS — I8311 Varicose veins of right lower extremity with inflammation: Secondary | ICD-10-CM | POA: Diagnosis not present

## 2022-04-14 DIAGNOSIS — L821 Other seborrheic keratosis: Secondary | ICD-10-CM | POA: Diagnosis not present

## 2022-04-14 DIAGNOSIS — Z85828 Personal history of other malignant neoplasm of skin: Secondary | ICD-10-CM | POA: Diagnosis not present

## 2022-04-21 ENCOUNTER — Telehealth: Payer: Medicare HMO

## 2022-04-21 IMAGING — MR MR 3D RECON AT [facility]
17 of 20 series · 40 of 48 positions shown · IV contrast (gadavist)
Comparison: MRI abdomen 08/29/2019

CLINICAL DATA: Follow-up pancreatic cyst.

EXAM:
MRI ABDOMEN WITHOUT AND WITH CONTRAST (INCLUDING MRCP)
TECHNIQUE: Multiplanar multisequence MR imaging of the abdomen was performed
both before and after the administration of intravenous contrast.
Heavily T2-weighted images of the biliary and pancreatic ducts were
obtained, and three-dimensional MRCP images were rendered by post
processing.
CONTRAST:  7mL GADAVIST GADOBUTROL 1 MMOL/ML IV SOLN

[Series 3: T2 fat-sat · axial · 6.0mm · 1.14mm/px · 1 of 38 slices shown]
[im 1/38]
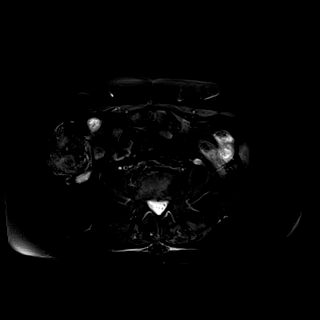

[Series 5: DWI · axial · 6.0mm · 1.36mm/px · z∈[-90,+169]mm · 3 of 74 slices shown (1 of 2)]
[im 1/74]
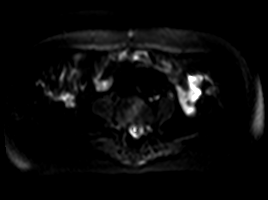
[im 37/74]
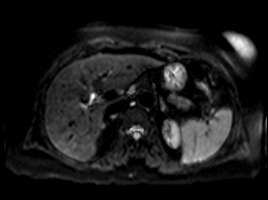
[im 74/74]
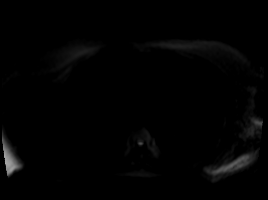

[Series 6: DWI · axial · 6.0mm · 1.36mm/px · 1 of 37 slices shown (2 of 2)]
[im 1/37]
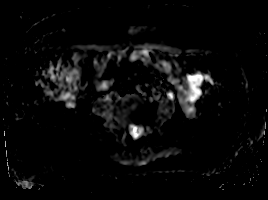

[Series 7: T1 · axial · 3.0mm · 1.02mm/px · z∈[-99,+138]mm · 3 of 80 slices shown (1 of 2)]
[im 1/80]
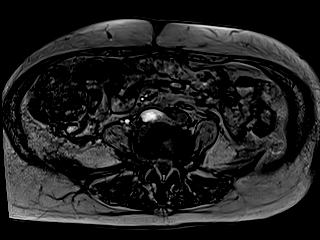
[im 40/80]
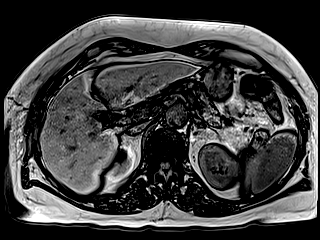
[im 80/80]
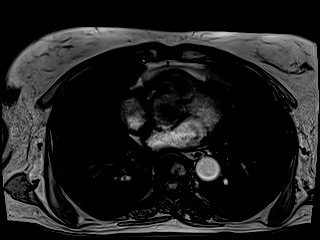

[Series 8: T1 · axial · 3.0mm · 1.02mm/px · z∈[-99,+138]mm · 3 of 80 slices shown (2 of 2)]
[im 1/80]
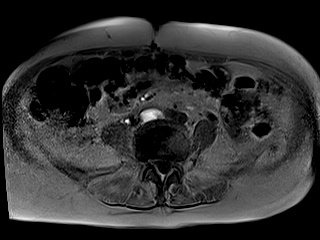
[im 40/80]
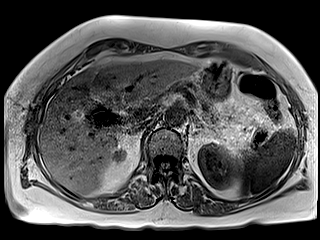
[im 80/80]
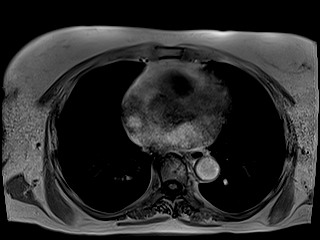

[Series 9: T2 · coronal · 6.0mm · 1.27mm/px · 1 of 27 slices shown (1 of 2)]
[im 1/27]
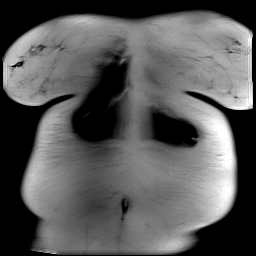

[Series 11: cor obl thk · sagittal · 50.0mm · 0.78mm/px · 1 of 9 slices shown]
[im 1/9]
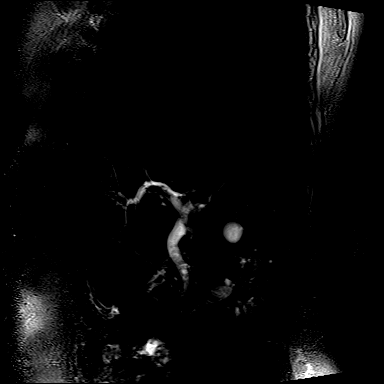

[Series 13: cor_3d_spc_trig · coronal · 1.0mm · 0.42mm/px · 3 of 88 slices shown]
[im 1/88]
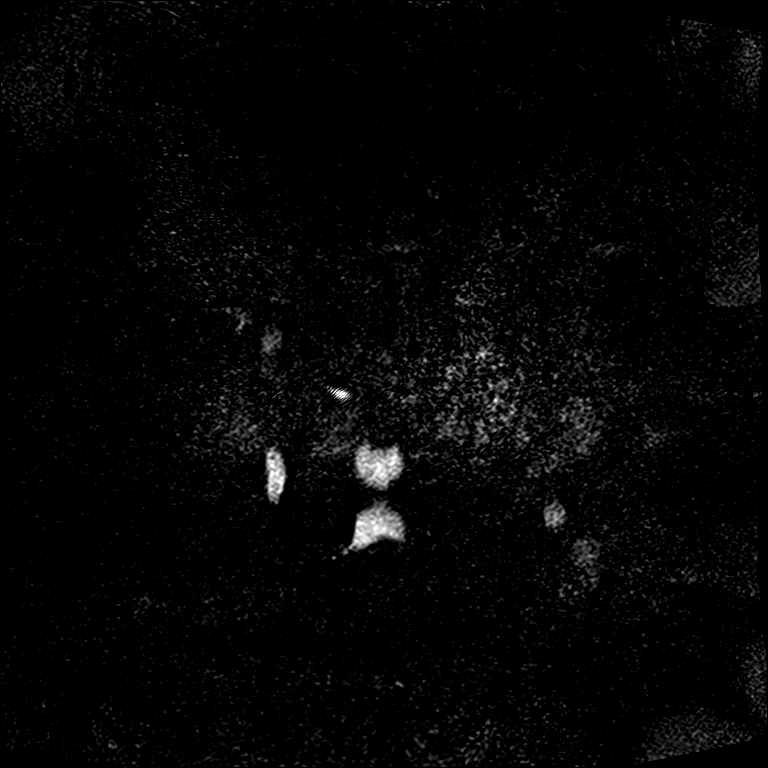
[im 44/88]
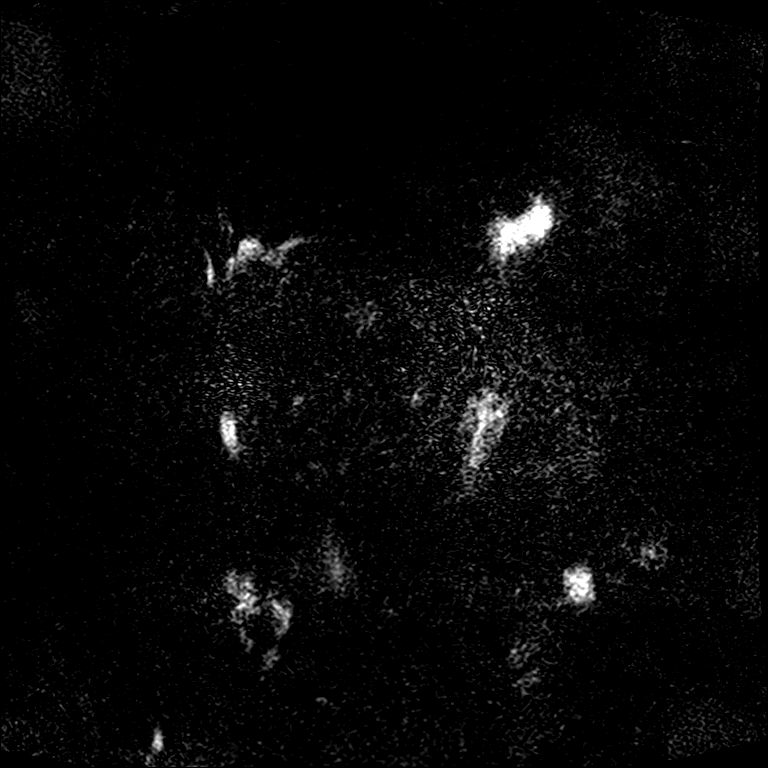
[im 88/88]
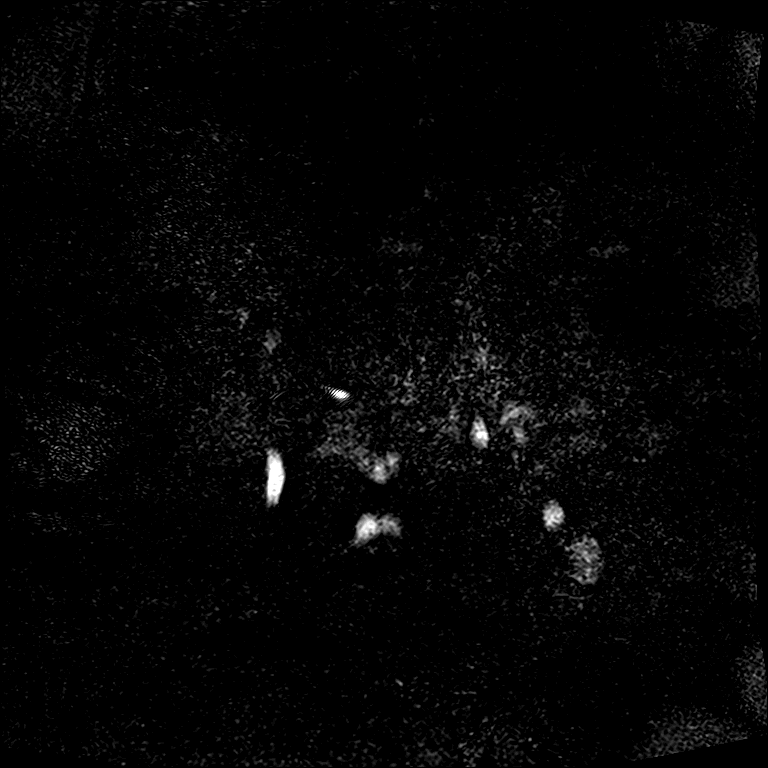

[Series 16: T1 dynamic · axial · 3.0mm · 1.02mm/px · z∈[-108,+151]mm · 3 of 88 slices shown (1 of 8)]
[im 1/88]
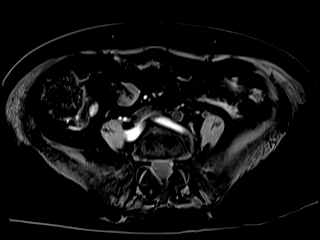
[im 44/88]
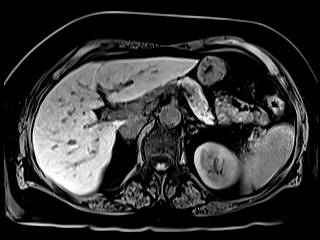
[im 88/88]
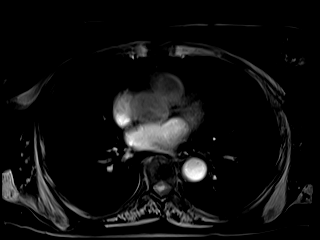

[Series 17: T2 · axial · 6.0mm · 1.27mm/px · 1 of 33 slices shown (2 of 2)]
[im 1/33]
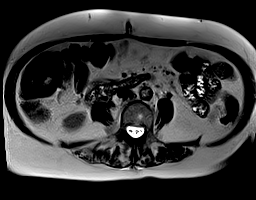

[Series 21: T1 dynamic · axial · 3.0mm · 1.02mm/px · z∈[-108,+151]mm · 3 of 88 slices shown (2 of 8)]
[im 1/88]
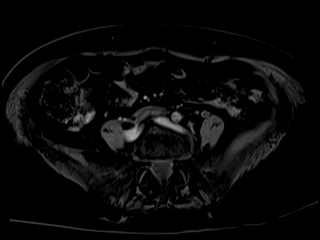
[im 44/88]
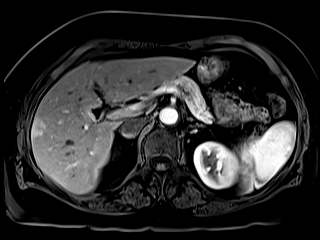
[im 88/88]
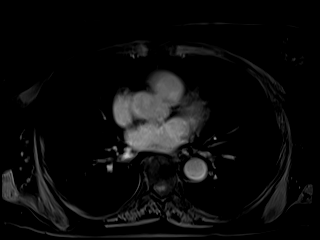

[Series 22: T1 dynamic · axial · 3.0mm · 1.02mm/px · z∈[-108,+151]mm · 3 of 88 slices shown (3 of 8)]
[im 1/88]
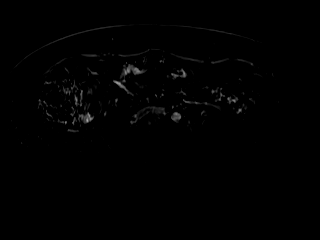
[im 44/88]
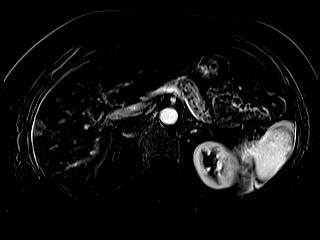
[im 88/88]
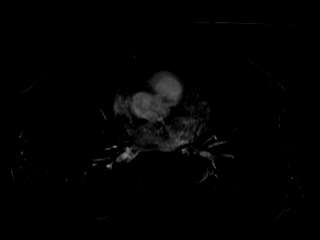

[Series 25: T1 dynamic · axial · 3.0mm · 1.02mm/px · z∈[-108,+151]mm · 3 of 88 slices shown (4 of 8)]
[im 1/88]
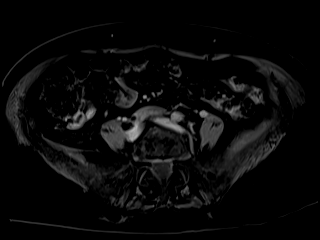
[im 44/88]
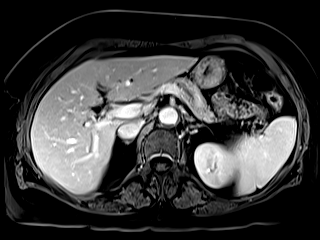
[im 88/88]
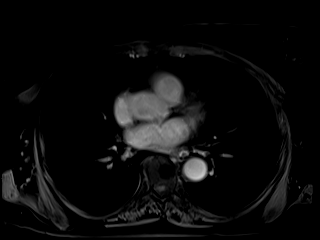

[Series 26: T1 dynamic · axial · 3.0mm · 1.02mm/px · z∈[-108,+151]mm · 3 of 88 slices shown (5 of 8)]
[im 1/88]
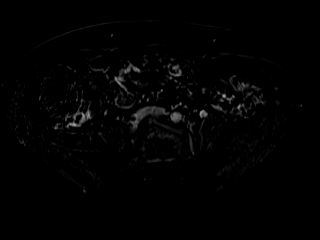
[im 44/88]
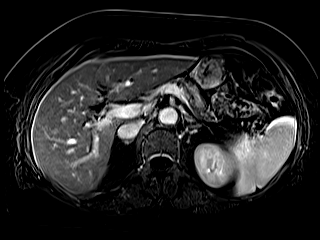
[im 88/88]
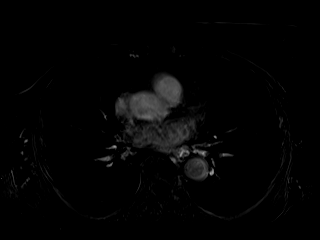

[Series 30: T1 dynamic · axial · 3.0mm · 1.02mm/px · z∈[-108,+151]mm · 3 of 88 slices shown (6 of 8)]
[im 1/88]
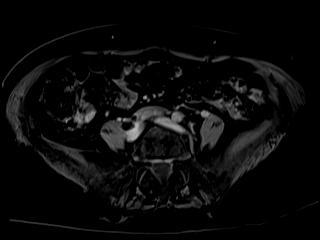
[im 44/88]
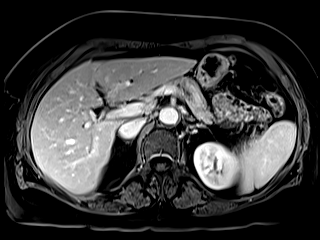
[im 88/88]
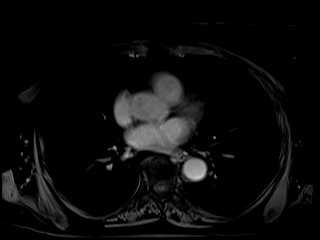

[Series 31: T1 dynamic · axial · 3.0mm · 1.02mm/px · z∈[-108,+151]mm · 3 of 88 slices shown (7 of 8)]
[im 1/88]
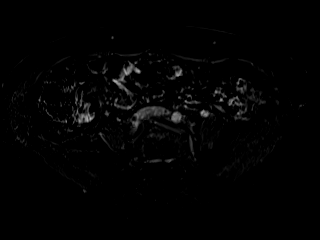
[im 44/88]
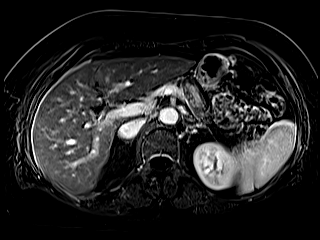
[im 88/88]
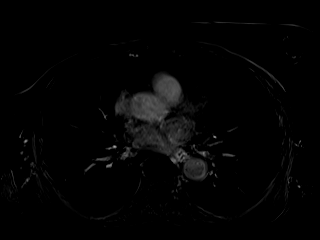

[Series 33: T1 dynamic · coronal · 3.0mm · 1.02mm/px · 2 of 72 slices shown (8 of 8)]
[im 1/72]
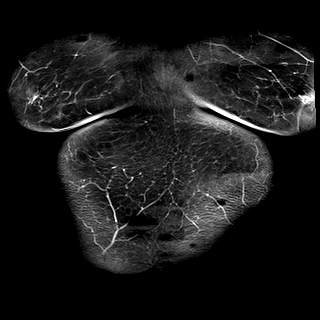
[im 72/72]
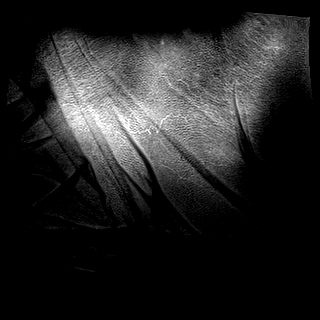

[40 of 48 positions shown; findings below may reference images not displayed]

FINDINGS: Lower chest: The lung bases are clear of an acute process. No
worrisome pulmonary lesions. No pleural or pericardial effusion.
Stable mild tortuosity and ectasia of the lower thoracic aorta.

Hepatobiliary: No worrisome hepatic lesions are identified. The
portal and hepatic veins are patent. Stable mild intra and
extrahepatic biliary dilatation likely due to the patient's age and
prior cholecystectomy. No common bile duct stones are identified.

Pancreas: Stable small multi septated cystic lesion in the body
region of the pancreas. It measures a maximum of 11 mm. It likely
communicates with the pancreatic duct and is likely a small side
branch IPMN. No worrisome MR imaging features. No evidence of
enhancement or nodularity. No dilatation of the pancreatic duct
proximal to the lesion. The remainder of the pancreas is
unremarkable. No inflammatory changes.

Spleen:  Normal size.  No focal lesions.

Adrenals/Urinary Tract: The adrenal glands and kidneys are
unremarkable. Small stable renal cysts.

Stomach/Bowel: The stomach, duodenum, visualized small bowel and
visualized colon are unremarkable.

Vascular/Lymphatic: The aorta is normal in caliber. No dissection.
The branch vessels are patent. The major venous structures are
patent. No mesenteric or retroperitoneal lymphadenopathy.

Other:  No ascites or abdominal wall hernia.

Musculoskeletal: No significant bony findings.
IMPRESSION: 1. Stable small multi septated cystic lesion in the body region of
the pancreas likely a small side branch IPMN. No worrisome MR
imaging features. Recommend follow-up MR examination in 2 years.
2. Stable mild intra and extrahepatic biliary dilatation likely due
to the patient's age and prior cholecystectomy. No common bile duct
stones.
3. Small stable renal cysts.

## 2022-05-05 ENCOUNTER — Ambulatory Visit (INDEPENDENT_AMBULATORY_CARE_PROVIDER_SITE_OTHER): Payer: Medicare HMO | Admitting: Pharmacist

## 2022-05-05 DIAGNOSIS — I10 Essential (primary) hypertension: Secondary | ICD-10-CM

## 2022-05-05 DIAGNOSIS — M109 Gout, unspecified: Secondary | ICD-10-CM

## 2022-05-05 DIAGNOSIS — E785 Hyperlipidemia, unspecified: Secondary | ICD-10-CM

## 2022-05-05 NOTE — Chronic Care Management (AMB) (Signed)
Chronic Care Management Pharmacy Note  05/07/2022 Name:  Angela Clay MRN:  [id number] DOB:  September 17, 1934  Summary:  Patient was widowed 01/23/2022. She report she is doing well with the exception of not sleeping well recently. She lost her husband Iona Beard 01/23/2022 and sleep schedule was off while she stayed with him in the hospital. She tried melatonin but didn't feel that it was helpful. Since our last visit sleep is a little better. She feels she just needs time to adjust to being at home at night by herself. I agree that it might take awhile.   Reviewed refill history patient has filled all maintenance medications on time so far in 2023.  Blood pressure at home recently 128/80, 120/80, 128/71 - at goal  No recent acute gout episodes. Continues to take tart cherry tablets daily. Has not needed to to take colchicine recently.  Last LDL was at goal but triglycerides a little elevated. Discussed limiting sweets and other foods high in carbohydrates. Patient also encouraged to restart exercise.  Sen information about exercise classed through [facility].   Subjective: Angela Clay is an 88 y.o. 88 year old female who is a primary patient of Paz, Alda Berthold, MD.  The CCM team was consulted for assistance with disease management and care coordination needs.    Engaged with patient by telephone for follow up visit in response to provider referral for pharmacy case management and/or care coordination services.   Consent to Services:  The patient was given information about Chronic Care Management services, agreed to services, and gave verbal consent prior to initiation of services.  Please see initial visit note for detailed documentation.   Patient Care Team: Colon Branch, MD as PCP - Cyndia Diver, MD as PCP - Cardiology (Cardiology) Jerrell Belfast, MD as Consulting Physician (Otolaryngology) [name], Amy, MD as Consulting Physician (Dermatology) Katy Apo, MD as Consulting Physician (Ophthalmology) Cherre Robins, [place] (Pharmacist)  Recent Office Visits:  01/21/2022 - Int Med (Dr Larose Kells) follow up. Labs checked. No med changes noted.  09/12/2021 - Int Med (Dr Larose Kells) Follow up chronic conditions.  Labs checked. Referred to GI in [place] due to [facility] too far per patient. Follow up in 4 months.  Recent Consult Visits:  04/11/2022 - MRI of abdomen. No significant change in a thinly septated small cystic lesion of the proximal pancreatic body measuring up to 1.1 cm. No associated solid component or contrast enhancement. No pancreatic ductal dilatation. As there is no observed increased risk of malignancy for such lesions smaller than 2 cm, no further follow-up or characterization is required, particularly given advanced patient age and well established initial stability. 03/26/2022 - Phone Call from GI to rescheduled MRi and barium swallow. Scheduled MRI but patient declined barium swallow.  03/19/2022 - Dermatology (Dr [name]) actinic keratosis and carcinoma in situ of skin of truck. Prodecure  to remove lesion on truck.  01/09/2022 - Primary Care ([place], [facility] )Video Visit. Positive COVID test. Prescribed molnupiravir 852m twice a day for 5 days.  01/01/2022 - GI (Berniece Pap NP) F/U autoimmunie hepatitis. Checked labs and IgG level (was WNL) . Ordered abdominal MRI to check cystic lysion on pancreas. Ordered barium swallow testing due to dysphagia. ALso prescribed fluconazole for possible candidiasis esophagitis. Continue omeprazole 244mdaily if needed.    Objective:  Lab Results  Component Value Date   CREATININE 0.79 01/01/2022   CREATININE 0.81 09/12/2021   CREATININE 0.80 02/25/2021  Lab Results  Component Value Date   HGBA1C 6.2 09/12/2021   Last diabetic Eye exam:  Lab Results  Component Value Date/Time   HMDIABEYEEXA No Retinopathy 07/18/2021 12:00 AM    Last diabetic Foot exam: No results found for:  "HMDIABFOOTEX"      Component Value Date/Time   CHOL 133 01/21/2022 1045   TRIG 157.0 (H) 01/21/2022 1045   HDL 50.80 01/21/2022 1045   CHOLHDL 3 01/21/2022 1045   VLDL 31.4 01/21/2022 1045   LDLCALC 51 01/21/2022 1045   LDLDIRECT 118.5 08/25/2007 0000       Latest Ref Rng & Units 01/01/2022    2:49 PM 09/12/2021   11:19 AM 02/25/2021   11:50 AM  Hepatic Function  Total Protein 6.0 - 8.3 g/dL 7.5  6.9  7.1   Albumin 3.5 - 5.2 g/dL 4.2  4.1  4.0   AST 0 - 37 U/L 20  18  16    ALT 0 - 35 U/L 18  17  19    Alk Phosphatase 39 - 117 U/L 87  82  82   Total Bilirubin 0.2 - 1.2 mg/dL 0.7  0.8  0.8     Lab Results  Component Value Date/Time   TSH 1.56 01/21/2022 10:45 AM   TSH 0.88 09/12/2021 11:19 AM       Latest Ref Rng & Units 09/12/2021   11:19 AM 11/22/2020   12:06 PM 01/10/2020    6:18 AM  CBC  WBC 4.0 - 10.5 K/uL 5.5  6.8  6.4   Hemoglobin 12.0 - 15.0 g/dL 13.2  13.6  13.0   Hematocrit 36.0 - 46.0 % 39.0  41.2  40.2   Platelets 150.0 - 400.0 K/uL 181.0  207.0  173     Lab Results  Component Value Date/Time   VD25OH 53.41 02/25/2021 11:50 AM   VD25OH 29.08 (L) 11/22/2020 12:06 PM    Clinical ASCVD: Yes  The ASCVD Risk score (Arnett DK, et al., 2019) failed to calculate for the following reasons:   The 2019 ASCVD risk score is only valid for ages 5 to 101    Dexa 07/04/2021 - Ordered by GYN  AP Spine L1-L2 07/04/2021 Osteopenia -1.1  AP Spine L1-L2 05/14/2017  Normal -0.6    DualFemur Neck Left 07/04/2021 Osteopenia -1.8  DualFemur Neck Left 05/14/2017 Osteopenia -1.8    DualFemur Total Left 07/04/2021 Osteopenia -1.8  DualFemur Total Left 05/14/2017 Normal -1.0    DualFemur Total Mean 07/04/2021 Osteopenia -1.4  DualFemur Total Mean 05/14/2017 Normal -0.7   Social History   Tobacco Use  Smoking Status Never  Smokeless Tobacco Never   BP Readings from Last 3 Encounters:  01/21/22 (!) 142/86  01/01/22 118/80  09/12/21 124/72   Pulse Readings from Last  3 Encounters:  01/21/22 83  01/01/22 73  09/12/21 75   Wt Readings from Last 3 Encounters:  01/21/22 162 lb 8 oz (73.7 kg)  01/01/22 163 lb (73.9 kg)  09/12/21 162 lb (73.5 kg)    Assessment: Review of patient past medical history, allergies, medications, health status, including review of consultants reports, laboratory and other test data, was performed as part of comprehensive evaluation and provision of chronic care management services.   SDOH:  (Social Determinants of Health) assessments and interventions performed:  SDOH Interventions    Flowsheet Row Most Recent Value  SDOH Interventions   Financial Strain Interventions Intervention Not Indicated  Physical Activity Interventions [employer], Other (Comments)  [discussed restarting [employer]  or classes at [facility]. Mailed information to patient.]  Social Connections Interventions Intervention Not Indicated       CCM Care Plan  Allergies  Allergen Reactions   Codeine Nausea And Vomiting    Medications Reviewed Today     Reviewed by Cherre Robins, RPH-CPP (Pharmacist) on 05/05/22 at 1012  Med List Status: <None>   Medication Order Taking? Sig Documenting Provider Last Dose Status Informant  acetaminophen (TYLENOL) 500 MG tablet [id number] Yes Take 500 mg by mouth every 6 (six) hours as needed for mild pain.  [provider] Taking Active Self           Med Note Leanord Asal, TREVINA M   Tue Jul 20, 2018  3:21 PM)    aspirin 81 MG tablet [id number] Yes Take 81 mg by mouth at bedtime. [provider] Taking Active Self  atorvastatin (LIPITOR) 40 MG tablet [id number] Yes TAKE 1 TABLET BY MOUTH AT BEDTIME Colon Branch, MD Taking Active   Calcium Carb-Cholecalciferol (CALCIUM 600 + D PO) [id number] Yes Take 1 tablet by mouth daily. [provider] Taking Active Self  Cholecalciferol (VITAMIN D3) 50 MCG (2000 UT) CAPS [id number] Yes Take 2,000 Units by mouth daily. [provider]  Taking Active   Colchicine (MITIGARE) 0.6 MG CAPS [id number] Yes Take 1 capsule by mouth 2 (two) times daily as needed. Colon Branch, MD Taking Active            Med Note (CANTER, [place] Sep 12, 2021 10:51 AM) PRN  hydrochlorothiazide (HYDRODIURIL) 25 MG tablet [id number] Yes Take 1 tablet by mouth once daily Colon Branch, MD Taking Active   levothyroxine (SYNTHROID) 100 MCG tablet [id number] Yes Take 1 tablet (100 mcg total) by mouth daily before breakfast. Colon Branch, MD Taking Active   Multiple Vitamins-Minerals (CENTRUM SILVER 50+WOMEN) TABS [id number] No Take 1 tablet by mouth daily.   Patient not taking: Reported on 05/05/2022   [provider] Not Taking Active Self  omeprazole (PRILOSEC) 20 MG capsule [id number] Yes TAKE 1 CAPSULE BY MOUTH ONCE DAILY AS NEEDED Colon Branch, MD Taking Active   Polyethyl Glycol-Propyl Glycol (SYSTANE ULTRA OP) [id number]  Place 1 drop into both eyes daily as needed (Dry eyes). [provider]  Active Self  Tart Cherry [place] [id number] Yes Take 1 capsule by mouth daily. [provider] Taking Active             Patient Active Problem List   Diagnosis Date Noted   Autoimmune hepatitis ([place]) 04/01/2020   Candida esophagitis ([place]) 12/31/2019   Chronic gastritis without bleeding 12/31/2019   Elevated alkaline phosphatase level 10/14/2019   Pancreatic cyst 10/14/2019   Dilated bile duct 10/14/2019   Positive antismooth muscle antibody  08/08/2019   Abnormal ultrasound of liver 07/07/2019   Abnormal LFTs 07/05/2019   Abnormal SPEP 06/22/2019   Pericarditis 08/09/2018   ACS (acute coronary syndrome) ([place]) 07/19/2018   Shingles 12/04/2015   Follow-up-----------------PCP notes  07/03/2015   Prolapse of female pelvic organs 03/11/2013   Dystrophy, vulva-- s/p bx, on clobetasol prn 03/11/2013   Monoarthritis of hand 03/10/2013   Diabetes mellitus without complication ([place]) 33/83/2919   Annual physical exam 02/19/2011    Carotid arterial disease ([place]) 02/19/2011   DEGENERATIVE JOINT DISEASE 04/26/2010   Hypothyroidism 01/11/2008   LYMPHOMA NEC, MLIG, UNSPECIFIED SITE 08/25/2007   Hyperlipidemia 08/25/2007   Essential hypertension 08/25/2007   Gastroesophageal reflux disease 08/25/2007  Osteopenia 08/25/2007   THYROIDECTOMY, HX OF 08/25/2007    Immunization History  Administered Date(s) Administered   Influenza Split 10/01/2011   Influenza Whole 08/25/2007, 09/08/2008, 08/03/2009, 08/20/2010   Influenza, High Dose Seasonal PF 07/03/2015, 07/07/2016, 07/16/2017, 07/21/2018, 08/04/2019   Influenza, Seasonal, Injecte, Preservative Fre 10/11/2012   Influenza,inj,Quad PF,6+ Mos 10/18/2013, 09/19/2014   Influenza-Unspecified 08/27/2020, 07/05/2021   Moderna Covid-19 Vaccine Bivalent Booster 109yr & up 09/30/2021   Moderna SARS-COV2 Booster Vaccination 08/27/2020, 03/27/2021   Moderna Sars-Covid-2 Vaccination 11/08/2019, 12/05/2019   Pneumococcal Conjugate-13 01/02/2014   Pneumococcal Polysaccharide-23 10/27/2000, 08/25/2007, 01/15/2018   Td 10/27/1997, 08/30/2008   Tdap 08/04/2019   Zoster, Live 03/04/2013    Conditions to be addressed/monitored: CAD, HTN, HLD, and gout; ACS; GERD; hypothyroidism; low vitamin D; pre Diabetes  Care Plan : General Pharmacy (Adult)  Updates made by ECherre Robins RPH-CPP since 05/07/2022 12:00 AM     Problem: Chronic conditions: gout; HTN; CAD; pre diabetes; GERD; hypothyroidism      Long-Range Goal: Provide education, support and care coordination for medication therapy and chronic conditions   Start Date: 08/09/2021  Priority: High  Note:   Current Barriers:  Unable to achieve control of gout - improved Need for education regarding gout treatment and disease management Need for education on osteopenia and therapeutic lifestyle treatment.  Pharmacist Clinical Goal(s):  Over the next 90 days, patient will: achieve control of gout as evidenced by no acute  episodes of gout Achieve triglycerides goal of <150 and maintain LDL at goal of <70 contact provider office for questions/concerns as evidenced notation of same in electronic health record through collaboration with PharmD and provider.   Interventions: 1:1 collaboration with PColon Branch MD regarding development and update of comprehensive plan of care as evidenced by provider attestation and co-signature Inter-disciplinary care team collaboration (see longitudinal plan of care) Comprehensive medication review performed; medication list updated in electronic medical record  Type 2 Diabetes: Controlled; A1c goal < 6.5% Current treatment: diet and exercise;  Interentions: (addressed at previous visit) Review last A1c and goal Continue to limit intake of high carbohydrate foods Restart  exercise - goal is to get 150 minutes of physical activity per weekly Reminded patient to get an eye exam each year  Hypertension: Controlled; blood pressure goal < 140/90 Current treatment: Hydrochlorothiazide 281mdaily in morning  Patient has failed these meds in the past: None noted Patient checks blood pressure at home occasionally. Blood pressure at home recently 136/72 and 119/80   If she continues to have 1 or 2 acute gout episodes in future might need to consider changing hydrochlorothiazide to ACEI or ARB Denies/reports hypotensive/hypertensive symptoms Interventions:  Discussed gout and blood pressure goals Recommended checking blood pressure once per week   Hyperlipidemia / CAD: LDL goal <70, Triglyceride goal < 150. Last LDL was at goal but triglycerides a little elevated. Current treatment:  atorvastatin 4012maily Interventions:  Continue current therapy for lowering risk of recurrent heart disease and to keep LDL <70 Discussed limiting sweets and other foods high in carbohydrates. Encouraged patient to restart exercise  Gout:  Last acute episode 05/24/2021 - in left hand Last  uric acid 7.1 on 04/11/2021 Current treatment:  Colchicine 0.6mg75m to twice a day as needed Tart Cherry Capsules 1200mg14mly Also has history of pericarditis treated with colchicine No recent acute gout episodes. Continues to take tart cherry tablets daily.  Interventions:  Discussed prevention of gout with diet and Tart Cherry Juice. Continue tart cherry  tablets daily. Avoid foods that can increase uric acid If she has another episode in the next year, consider either change hydrochlorothiazide which can increase uric acid or consider addition of allopurinol daily as preventative.   Osteopenia:  Last DEXA 07/04/2021 showed osteopenia Current treatment Calcium + D once a day Multivitamin once daily Has taken alendronate in past 2007 to 2012 Interventions:  Discussed weight bearing exercise - recommend adding 2 to 3 sessions per week Continue to take supplements for bone health - reminded her to take supplements at least 2 hours before or after levothyroxine.   Medication management Pharmacist Clinical Goal(s): Over the next 90 days, patient will work with PharmD and providers to maintain optimal medication adherence Interventions Comprehensive medication review performed. Reviewed refill history and assessed adherence; all maintenance medications on time so far in 2023.  Continue current medication management strategy   Patient Goals/Self-Care Activities Over the next 90 days, patient will:  take medications as prescribed check blood pressure weekly, document, and provide at future appointments,  engage in dietary modifications by limiting foods that can increase uric acid levels / gout attacks (shell fish, alcohol, red meat, sweets) Restart exercise with goal of 150 minutes per week with 2 to 3 days of weight bearing exercise to help with bone health.   Follow Up Plan: Telephone follow up appointment with care management team member scheduled for:  3 to 4 months          Medication Assistance: None required.  Patient affirms current coverage meets needs.  Patient's preferred pharmacy is:  [place], [place] [address] Phone: [phone] Fax: [facility] [address] [place] [address] Phone: [phone] Fax: [phone]    Follow Up:  Patient agrees to Care Plan and Follow-up.  Plan: Telephone follow up appointment with care management team member scheduled for:  3 to 4 months  Cherre Robins, PharmD Clinical Pharmacist [facility]

## 2022-05-07 NOTE — Patient Instructions (Signed)
Mrs. Rudge It was a pleasure speaking with you  Below is a summary of your health goals. I have also included information about exercise classes at [facility].    Patient Goals/Self-Care Activities take medications as prescribed check blood pressure weekly, document, and provide at future appointments,  engage in dietary modifications by limiting foods that can increase uric acid levels / gout attacks (tomatoes, shell fish, alcohol, red meat, sweets) Restart exercise with goal of 150 minutes per week with 2 to 3 days of weight bearing exercise to help with bone health.   Follow Up Plan: Telephone follow up appointment with care management team member scheduled for:  3 to 4 months    If you have any questions or concerns, please feel free to contact me either at the phone number below or with a MyChart message.   Keep up the good work!  Cherre Robins, PharmD Clinical Pharmacist [place] [phone] (direct line)  [phone] (main office number)   The patient verbalized understanding of instructions, educational materials, and care plan provided today and agreed to receive a mailed copy of patient instructions, educational materials, and care plan.

## 2022-05-26 DIAGNOSIS — E785 Hyperlipidemia, unspecified: Secondary | ICD-10-CM

## 2022-05-26 DIAGNOSIS — I10 Essential (primary) hypertension: Secondary | ICD-10-CM

## 2022-05-29 ENCOUNTER — Other Ambulatory Visit: Payer: Self-pay | Admitting: Internal Medicine

## 2022-07-01 ENCOUNTER — Ambulatory Visit (INDEPENDENT_AMBULATORY_CARE_PROVIDER_SITE_OTHER): Payer: Medicare HMO | Admitting: Internal Medicine

## 2022-07-01 ENCOUNTER — Encounter: Payer: Self-pay | Admitting: Internal Medicine

## 2022-07-01 VITALS — BP 130/68 | HR 78 | Temp 97.8°F | Resp 18 | Ht 63.0 in | Wt 163.0 lb

## 2022-07-01 DIAGNOSIS — E559 Vitamin D deficiency, unspecified: Secondary | ICD-10-CM | POA: Diagnosis not present

## 2022-07-01 DIAGNOSIS — E785 Hyperlipidemia, unspecified: Secondary | ICD-10-CM

## 2022-07-01 DIAGNOSIS — E039 Hypothyroidism, unspecified: Secondary | ICD-10-CM | POA: Diagnosis not present

## 2022-07-01 DIAGNOSIS — R079 Chest pain, unspecified: Secondary | ICD-10-CM | POA: Diagnosis not present

## 2022-07-01 DIAGNOSIS — E119 Type 2 diabetes mellitus without complications: Secondary | ICD-10-CM

## 2022-07-01 DIAGNOSIS — I10 Essential (primary) hypertension: Secondary | ICD-10-CM | POA: Diagnosis not present

## 2022-07-01 LAB — MICROALBUMIN / CREATININE URINE RATIO
Creatinine,U: 29.7 mg/dL
Microalb Creat Ratio: 2.4 mg/g (ref 0.0–30.0)
Microalb, Ur: <0.7 mg/dL (ref 0.0–1.9)

## 2022-07-01 LAB — BASIC METABOLIC PANEL
BUN: 14 mg/dL (ref 6–23)
CO2: 31 mEq/L (ref 19–32)
Calcium: 9.4 mg/dL (ref 8.4–10.5)
Chloride: 101 mEq/L (ref 96–112)
Creatinine, Ser: 0.81 mg/dL (ref 0.40–1.20)
GFR: 65.65 mL/min (ref >60.00)
Glucose, Bld: 74 mg/dL (ref 70–99)
Potassium: 3.9 mEq/L (ref 3.5–5.1)
Sodium: 140 mEq/L (ref 135–145)

## 2022-07-01 LAB — CBC WITH DIFFERENTIAL/PLATELET
Basophils Absolute: 0 10*3/uL (ref 0.0–0.1)
Basophils Relative: 0.7 % (ref 0.0–3.0)
Eosinophils Absolute: 0.2 10*3/uL (ref 0.0–0.7)
Eosinophils Relative: 3.5 % (ref 0.0–5.0)
HCT: 37.9 % (ref 36.0–46.0)
Hemoglobin: 13 g/dL (ref 12.0–15.0)
Lymphocytes Relative: 22.6 % (ref 12.0–46.0)
Lymphs Abs: 1.4 10*3/uL (ref 0.7–4.0)
MCHC: 34.3 g/dL (ref 30.0–36.0)
MCV: 87.8 fl (ref 78.0–100.0)
Monocytes Absolute: 0.5 10*3/uL (ref 0.1–1.0)
Monocytes Relative: 7.5 % (ref 3.0–12.0)
Neutro Abs: 4.2 10*3/uL (ref 1.4–7.7)
Neutrophils Relative %: 65.7 % (ref 43.0–77.0)
Platelets: 180 10*3/uL (ref 150.0–400.0)
RBC: 4.32 Mil/uL (ref 3.87–5.11)
RDW: 14.5 % (ref 11.5–15.5)
WBC: 6.4 10*3/uL (ref 4.0–10.5)

## 2022-07-01 LAB — TSH: TSH: 3.17 u[IU]/mL (ref 0.35–5.50)

## 2022-07-01 LAB — HEMOGLOBIN A1C: Hgb A1c MFr Bld: 6.6 % — ABNORMAL HIGH (ref 4.6–6.5)

## 2022-07-01 LAB — VITAMIN D 25 HYDROXY (VIT D DEFICIENCY, FRACTURES): VITD: 33.96 ng/mL (ref 30.00–100.00)

## 2022-07-01 MED ORDER — SHINGRIX 50 MCG/0.5ML IM SUSR: 0.5000 mL | Freq: Once | INTRAMUSCULAR | 1 refills | Status: AC

## 2022-07-01 NOTE — Progress Notes (Unsigned)
Subjective:    Patient ID: Angela Clay, female    DOB: May 15, 1934, 88 y.o.   MRN: [id number]  DOS:  07/01/2022 Type of visit - description: Follow-up  Routine follow-up Has a history of difficulty swallowing, at baseline, no nausea vomiting.  No blood in the stools.  No abdominal pain.  No weight loss. Ambulatory BPs reportedly never more than 140. No recent gout episodes She did report chest pain, going on for several months, typically 1 episode a day, last 1 minutes, very mild (pain scale 1/10).  No associated nausea or diaphoresis, no radiation.  Typically at rest  Review of Systems See above   Past Medical History:  Diagnosis Date   Actinic keratosis 05/2015   Right mid central dorsal hand   Autoimmune hepatitis ([place])    Gallstones    GERD (gastroesophageal reflux disease)    EGD for dysphagia 2000 aprox (-)   History of radiation therapy    Hyperlipidemia    Hypertension    Hypothyroidism    Lymphoma ([place])    neck,mlig,unspecified site dx 1970s   OA (osteoarthritis)    severe at neck   Osteopenia    Pancreatic cyst    Syncope 11/2012   admited . w/u (-)   Thyroid cancer [facility])     Past Surgical History:  Procedure Laterality Date   APPENDECTOMY     BELPHAROPTOSIS REPAIR Bilateral 2017   BIOPSY  11/23/2019   Procedure: BIOPSY;  Surgeon: Irving Copas., MD;  Location: [facility];  Service: Gastroenterology;;   CATARACT EXTRACTION, BILATERAL Bilateral 01/10/2019   Dr.Rankin   CHOLECYSTECTOMY  02/24/2006   ESOPHAGOGASTRODUODENOSCOPY (EGD) WITH PROPOFOL N/A 11/23/2019   Procedure: ESOPHAGOGASTRODUODENOSCOPY (EGD) WITH PROPOFOL;  Surgeon: Irving Copas., MD;  Location: [facility];  Service: Gastroenterology;  Laterality: N/A;   EXPLORATORY LAPAROTOMY     for lymphadenomopathy; appendix removed at this time   LEFT HEART CATH AND CORONARY ANGIOGRAPHY N/A 07/20/2018   Procedure: LEFT HEART CATH AND CORONARY ANGIOGRAPHY;  Surgeon: Belva Crome, MD;  Location: [facility];  Service: Cardiovascular;  Laterality: N/A;   LIVER BIOPSY     THYROIDECTOMY     TONSILLECTOMY AND ADENOIDECTOMY  1949   TUBAL LIGATION  1972   UPPER ESOPHAGEAL ENDOSCOPIC ULTRASOUND (EUS) N/A 11/23/2019   Procedure: UPPER ESOPHAGEAL ENDOSCOPIC ULTRASOUND (EUS);  Surgeon: Irving Copas., MD;  Location: [facility];  Service: Gastroenterology;  Laterality: N/A;  WITH BIOPSY   NEEDS 90 MINUTES   vitamacular hole surgery Right 2020   eye    Current Outpatient Medications  Medication Instructions   acetaminophen (TYLENOL) 500 mg, Oral, Every 6 hours PRN   aspirin 81 mg, Oral, Daily at bedtime,     atorvastatin (LIPITOR) 40 MG tablet TAKE 1 TABLET BY MOUTH AT BEDTIME   Calcium Carb-Cholecalciferol (CALCIUM 600 + D PO) 1 tablet, Oral, Daily   Colchicine (MITIGARE) 0.6 MG CAPS 1 capsule, Oral, 2 times daily PRN   hydrochlorothiazide (HYDRODIURIL) 25 MG tablet Take 1 tablet by mouth once daily   levothyroxine (SYNTHROID) 100 mcg, Oral, Daily before breakfast   Multiple Vitamins-Minerals (CENTRUM SILVER 50+WOMEN) TABS 1 tablet, Daily   omeprazole (PRILOSEC) 20 MG capsule TAKE 1 CAPSULE BY MOUTH ONCE DAILY AS NEEDED   Polyethyl Glycol-Propyl Glycol (SYSTANE ULTRA OP) 1 drop, Both Eyes, Daily PRN   Tart Cherry 1200 MG CAPS 1 capsule, Oral, Daily   vitamin D3 2,000 Units, Oral, Daily   Zoster Vaccine Adjuvanted (  SHINGRIX) injection 0.5 mLs, Intramuscular,  Once       Objective:   Physical Exam BP 130/68   Pulse 78   Temp 97.8 F (36.6 C) (Oral)   Resp 18   Ht 5' 3"  (1.6 m)   Wt 163 lb (73.9 kg)   SpO2 97%   BMI 28.87 kg/m  General:   Well developed, NAD, BMI noted.  HEENT:  Normocephalic . Face symmetric, atraumatic Lungs:  CTA B Normal respiratory effort, no intercostal retractions, no accessory muscle use. Heart: RRR,  no murmur.  Abdomen:  Not distended, soft, non-tender. No rebound or rigidity.   Skin: Not pale. Not  jaundice DM foot exam: Trace pedal edema bilaterally, good pedal pulses, pinprick examination normal. Neurologic:  alert & oriented X3.  Speech normal, gait appropriate for age and unassisted Psych--  Cognition and judgment appear intact.  Cooperative with normal attention span and concentration.  Behavior appropriate. No anxious or depressed appearing.     Assessment     Assessment DM A1c 6.8 (04-2019) Hyperlipidemia HTN Hypothyroidism Gout  Autoimmune hepatitis: Carotid artery disease [place] 11-2014: 60-79% RICA stenosis. 29-52% LICA stenosis [place] 05/4131: 1-39 % B. 1 year [place] 06/2017 1-39%, no further routine [place] DJD, severe at the neck GERD, dysphagia remotely Osteopenia: -s/p fosamax 2007 to 2012. -T score -1.7 (01-2015)  -T score -1.8 (04-2017) rx ca- vitamin D + Vitamin D deficiency Lymphoma,  Neck, 1970s Syncope, admitted-2014, workup negative Shingles: 10/02/2016, left arm Chest pain: Cath 07/19/2018, negative, pericarditis?  PLAN: Routine checkup DM: Diet controlled, check A1c. HTN: BPs are well controlled on HCTZ.  Check a BMP. Hyperlipidemia: On atorvastatin, last FLP satisfactory. GERD: On PPIs, has dysphagia for several months, symptoms are stable.  GI recommended further eval but patient was not able to pursue and is not interested at this point.  Recommend to call if she changes her mind or experiences red flag symptoms. Osteopenia: Last DEXA 06-2021, per gynecology Vitamin D deficiency: on supplements, labs Chest pain: As described above.  EKG today: NSR, unchanged from previous.  Advised patient that on clinical grounds I cannot rule out CAD, options: Stress test, cardiology referral, observation (due to chest pain being mild and atypical negative cardiac cath 4 years ago). She elected observation, red flags discussed.  Reassess in 4 months.  Sooner if symptoms increase. Social: Loss her husband 12-2021, has good days and bad days, fortunately she reports excellent  family and church support.   Vaccines I recommend: Flu shot, Shingrix, COVID booster. RTC CPX 4 months

## 2022-07-01 NOTE — Patient Instructions (Addendum)
Recommend to proceed with the following vaccines at your pharmacy:  Shingrix (shingles) Flu shot- high dose COVID booster  Check the  blood pressure regularly BP GOAL is between 110/65 and  135/85. If it is consistently higher or lower, let me know  GO TO THE LAB : Get the blood work     [place], [place] back for a physical exam in 4 months

## 2022-07-02 ENCOUNTER — Encounter: Payer: Self-pay | Admitting: Internal Medicine

## 2022-07-02 NOTE — Assessment & Plan Note (Signed)
Routine checkup DM: Diet controlled, check A1c. HTN: BPs are well controlled on HCTZ.  Check a BMP. Hyperlipidemia: On atorvastatin, last FLP satisfactory. GERD: On PPIs, has dysphagia for several months, symptoms are stable.  GI recommended further eval but patient was not able to pursue and is not interested at this point.  Recommend to call if she changes her mind or experiences red flag symptoms. Osteopenia: Last DEXA 06-2021, per gynecology Vitamin D deficiency: on supplements, labs Chest pain: As described above.  EKG today: NSR, unchanged from previous.  Advised patient that on clinical grounds I cannot rule out CAD, options: Stress test, cardiology referral, observation (due to chest pain being mild and atypical negative cardiac cath 4 years ago). She elected observation, red flags discussed.  Reassess in 4 months.  Sooner if symptoms increase. Social: Loss her husband 12-2021, has good days and bad days, fortunately she reports excellent family and church support.   Vaccines I recommend: Flu shot, Shingrix, COVID booster. RTC CPX 4 months

## 2022-07-25 DIAGNOSIS — H524 Presbyopia: Secondary | ICD-10-CM | POA: Diagnosis not present

## 2022-07-25 DIAGNOSIS — Z961 Presence of intraocular lens: Secondary | ICD-10-CM | POA: Diagnosis not present

## 2022-07-25 LAB — HM DIABETES EYE EXAM

## 2022-08-01 ENCOUNTER — Other Ambulatory Visit: Payer: Self-pay | Admitting: Internal Medicine

## 2022-08-21 ENCOUNTER — Other Ambulatory Visit: Payer: Self-pay | Admitting: Internal Medicine

## 2022-09-05 ENCOUNTER — Ambulatory Visit (INDEPENDENT_AMBULATORY_CARE_PROVIDER_SITE_OTHER): Payer: Medicare HMO | Admitting: *Deleted

## 2022-09-05 DIAGNOSIS — Z0000 Encounter for general adult medical examination without abnormal findings: Secondary | ICD-10-CM

## 2022-09-05 NOTE — Progress Notes (Addendum)
Subjective:   Angela Clay is a 88 y.o. female who presents for Medicare Annual (Subsequent) preventive examination.  I connected with  Angela Clay on 09/05/22 by a audio enabled telemedicine application and verified that I am speaking with the correct person using two identifiers.  Patient Location: Home  Provider Location: Office/Clinic  I discussed the limitations of evaluation and management by telemedicine. The patient expressed understanding and agreed to proceed.   Review of Systems    Defer to PCP  Cardiac Risk Factors include: advanced age (>91mn, >>22women);dyslipidemia;hypertension;diabetes mellitus     Objective:    There were no vitals filed for this visit. There is no height or weight on file to calculate BMI.     09/05/2022    1:01 PM 09/02/2021    1:48 PM 06/28/2020    1:11 PM 01/10/2020    6:41 AM 11/23/2019    6:43 AM 07/21/2019   10:34 AM 06/28/2019   12:21 PM  Advanced Directives  Does Patient Have a Medical Advance Directive? Yes Yes Yes Yes Yes Yes Yes  Type of AParamedicof [place]Living will [place] will Living will Living will Living will [place]Living will [place]Living will  Does patient want to make changes to medical advance directive? No - Patient declined  No - Patient declined No - Patient declined  No - Patient declined   Copy of HFerryin Chart? Yes - validated most recent copy scanned in chart (See row information) Yes - validated most recent copy scanned in chart (See row information)    No - copy requested No - copy requested    Current Medications (verified) Outpatient Encounter Medications as of 09/05/2022  Medication Sig   acetaminophen (TYLENOL) 500 MG tablet Take 500 mg by mouth every 6 (six) hours as needed for mild pain.    aspirin 81 MG tablet Take 81 mg by mouth at bedtime.   atorvastatin (LIPITOR) 40 MG  tablet TAKE 1 TABLET BY MOUTH AT BEDTIME   Calcium Carb-Cholecalciferol (CALCIUM 600 + D PO) Take 1 tablet by mouth daily.   Cholecalciferol (VITAMIN D3) 50 MCG (2000 UT) CAPS Take 2,000 Units by mouth daily.   Colchicine (MITIGARE) 0.6 MG CAPS Take 1 capsule by mouth 2 (two) times daily as needed.   hydrochlorothiazide (HYDRODIURIL) 25 MG tablet Take 1 tablet (25 mg total) by mouth daily.   levothyroxine (SYNTHROID) 100 MCG tablet TAKE 1 TABLET BY MOUTH ONCE DAILY BEFORE BREAKFAST   Multiple Vitamins-Minerals (CENTRUM SILVER 50+WOMEN) TABS Take 1 tablet by mouth daily.  (Patient not taking: Reported on 05/05/2022)   omeprazole (PRILOSEC) 20 MG capsule Take 1 capsule (20 mg total) by mouth daily as needed.   Polyethyl Glycol-Propyl Glycol (SYSTANE ULTRA OP) Place 1 drop into both eyes daily as needed (Dry eyes).   Tart Cherry 1200 MG CAPS Take 1 capsule by mouth daily.   No facility-administered encounter medications on file as of 09/05/2022.    Allergies (verified) Codeine   History: Past Medical History:  Diagnosis Date   Actinic keratosis 05/2015   Right mid central dorsal hand   Autoimmune hepatitis ([place]    Gallstones    GERD (gastroesophageal reflux disease)    EGD for dysphagia 2000 aprox (-)   History of radiation therapy    Hyperlipidemia    Hypertension    Hypothyroidism    Lymphoma ([place]    neck,mlig,unspecified site  dx 1970s   OA (osteoarthritis)    severe at neck   Osteopenia    Pancreatic cyst    Syncope 11/2012   admited . w/u (-)   Thyroid cancer [facility])    Past Surgical History:  Procedure Laterality Date   APPENDECTOMY     BELPHAROPTOSIS REPAIR Bilateral 2017   BIOPSY  11/23/2019   Procedure: BIOPSY;  Surgeon: Irving Copas., MD;  Location: [facility];  Service: Gastroenterology;;   CATARACT EXTRACTION, BILATERAL Bilateral 01/10/2019   Dr.Rankin   CHOLECYSTECTOMY  02/24/2006   ESOPHAGOGASTRODUODENOSCOPY (EGD) WITH PROPOFOL N/A 11/23/2019    Procedure: ESOPHAGOGASTRODUODENOSCOPY (EGD) WITH PROPOFOL;  Surgeon: Irving Copas., MD;  Location: [facility];  Service: Gastroenterology;  Laterality: N/A;   EXPLORATORY LAPAROTOMY     for lymphadenomopathy; appendix removed at this time   LEFT HEART CATH AND CORONARY ANGIOGRAPHY N/A 07/20/2018   Procedure: LEFT HEART CATH AND CORONARY ANGIOGRAPHY;  Surgeon: Belva Crome, MD;  Location: [facility];  Service: Cardiovascular;  Laterality: N/A;   LIVER BIOPSY     THYROIDECTOMY     TONSILLECTOMY AND ADENOIDECTOMY  1949   TUBAL LIGATION  1972   UPPER ESOPHAGEAL ENDOSCOPIC ULTRASOUND (EUS) N/A 11/23/2019   Procedure: UPPER ESOPHAGEAL ENDOSCOPIC ULTRASOUND (EUS);  Surgeon: Irving Copas., MD;  Location: [facility];  Service: Gastroenterology;  Laterality: N/A;  WITH BIOPSY   NEEDS 27 MINUTES   vitamacular hole surgery Right 2020   eye   Family History  Problem Relation Age of Onset   Stroke Mother 40   Coronary artery disease Father        MI age 33   Deep vein thrombosis Brother        PE at age 48   Uterine cancer Sister    Heart failure Sister    Diabetes Neg Hx    Colon cancer Neg Hx    Breast cancer Neg Hx    Inflammatory bowel disease Neg Hx    Liver disease Neg Hx    Pancreatic cancer Neg Hx    Rectal cancer Neg Hx    Stomach cancer Neg Hx    Social History   Socioeconomic History   Marital status: Widowed    Spouse name: Iona Beard   Number of children: 3   Years of education: Not on file   Highest education level: Not on file  Occupational History   Occupation: retired  Tobacco Use   Smoking status: Never   Smokeless tobacco: Never  Vaping Use   Vaping Use: Never used  Substance and Sexual Activity   Alcohol use: No    Alcohol/week: 0.0 standard drinks of alcohol   Drug use: No   Sexual activity: Not Currently    Partners: Male    Birth control/protection: Post-menopausal  Other Topics Concern   Not on file  Social History  Narrative   3 kids, 5 [place] a g-child to suicide 2017      Lost her husband 01/23/2022   Social Determinants of Health   Financial Resource Strain: Low Risk  (05/05/2022)   Overall Financial Resource Strain (CARDIA)    Difficulty of Paying Living Expenses: Not hard at all  Food Insecurity: No Food Insecurity (09/05/2022)   Hunger Vital Sign    Worried About Running Out of Food in the Last Year: Never true    [place] in the Last Year: Never true  Transportation Needs: No Transportation Needs (09/05/2022)  PRAPARE - Hydrologist (Medical): No    Lack of Transportation (Non-Medical): No  Physical Activity: Inactive (05/05/2022)   Exercise Vital Sign    Days of Exercise per Week: 0 days    Minutes of Exercise per Session: 0 min  Stress: No Stress Concern Present (09/05/2022)   [place]    Feeling of Stress : Only a little  Social Connections: Moderately Integrated (05/05/2022)   Social Connection and Isolation Panel [NHANES]    Frequency of Communication with Friends and Family: Three times a week    Frequency of Social Gatherings with Friends and Family: Three times a week    Attends Religious Services: More than 4 times per year    Active Member of Clubs or Organizations: Yes    Attends Archivist Meetings: More than 4 times per year    Marital Status: Widowed    Tobacco Counseling Counseling given: Not Answered   Clinical Intake:  Pre-visit preparation completed: Yes  Pain : No/denies pain  How often do you need to have someone help you when you read instructions, pamphlets, or other written materials from your doctor or pharmacy?: 1 - Never  Diabetic? Nutrition Risk Assessment:  Has the patient had any N/V/D within the last 2 months?  No  Does the patient have any non-healing wounds?  No  Has the patient had any unintentional weight loss or weight  gain?  No   Diabetes:  Is the patient diabetic?  Yes  If diabetic, was a CBG obtained today?  No  Did the patient bring in their glucometer from home?   Audio visit How often do you monitor your CBG's? never.   Financial Strains and Diabetes Management:  Are you having any financial strains with the device, your supplies or your medication? No .  Does the patient want to be seen by Chronic Care Management for management of their diabetes?  No  Would the patient like to be referred to a Nutritionist or for Diabetic Management?  No   Diabetic Exams:  Diabetic Eye Exam: Overdue for diabetic eye exam. Pt has been advised about the importance in completing this exam. Patient advised to call and schedule an eye exam. Diabetic Foot Exam: Completed 07/01/22    Interpreter Needed?: No  Information entered by :: Beatris Ship, [place]   Activities of Daily Living    09/05/2022    1:04 PM  In your present state of health, do you have any difficulty performing the following activities:  Hearing? 0  Vision? 0  Difficulty concentrating or making decisions? 0  Walking or climbing stairs? 0  Dressing or bathing? 0  Doing errands, shopping? 0  Preparing Food and eating ? N  Using the Toilet? N  In the past six months, have you accidently leaked urine? Y  Do you have problems with loss of bowel control? N  Managing your Medications? N  Managing your Finances? N  Housekeeping or managing your Housekeeping? N    Patient Care Team: Colon Branch, MD as PCP - Cyndia Diver, MD as PCP - Cardiology (Cardiology) Jerrell Belfast, MD as Consulting Physician (Otolaryngology) [name], Amy, MD as Consulting Physician (Dermatology) Katy Apo, MD as Consulting Physician (Ophthalmology) Cherre Robins, [place] (Pharmacist)  Indicate any recent Medical Services you may have received from other than [facility] providers in the past year (date may be approximate).     Assessment:  This is a  routine wellness examination for Ernestene.  Hearing/Vision screen No results found.  Dietary issues and exercise activities discussed: Current Exercise Habits: The patient does not participate in regular exercise at present, Exercise limited by: None identified   Goals Addressed   None    Depression Screen    09/05/2022    1:04 PM 09/02/2021    1:52 PM 11/22/2020   11:03 AM 06/28/2020    1:18 PM 05/31/2019    1:18 PM 05/25/2018    1:12 PM 05/11/2017    1:54 PM  PHQ 2/9 Scores  PHQ - 2 Score 0 1 0 0 0 0 0    Fall Risk    09/05/2022    1:04 PM 09/02/2021    1:50 PM 11/22/2020   11:03 AM 06/28/2020    1:18 PM 05/31/2019    1:18 PM  [place] in the past year? 0 0 0 0 0  Number falls in past yr: 0 0 0 0   Injury with Fall? 0 0 0 0   Risk for fall due to : No Fall Risks      Follow up Falls evaluation completed Falls prevention discussed Falls evaluation completed Education provided;Falls prevention discussed     FALL RISK PREVENTION PERTAINING TO THE HOME:  Any stairs in or around the home? No  If so, are there any without handrails?  No stairs Home free of loose throw rugs in walkways, pet beds, electrical cords, etc? Yes  Adequate lighting in your home to reduce risk of falls? Yes   ASSISTIVE DEVICES UTILIZED TO PREVENT FALLS:  Life alert? No  Use of a cane, walker or w/c? No  Grab bars in the bathroom? No  Shower chair or bench in shower? No  Elevated toilet seat or a handicapped toilet? Yes   TIMED UP AND GO:  Was the test performed?  No, audio visit .    Cognitive Function:    05/11/2017    1:54 PM  MMSE - Mini Mental State Exam  Orientation to time 5  Orientation to Place 5  Registration 3  Attention/ Calculation 5  Recall 2  Language- name 2 objects 2  Language- repeat 1  Language- follow 3 step command 2  Language- read & follow direction 1  Write a sentence 1  Copy design 1  Total score 28        09/05/2022    1:15 PM  6CIT Screen  What  Year? 0 points  What month? 0 points  What time? 0 points  Count back from 20 0 points  Months in reverse 0 points  Repeat phrase 2 points  Total Score 2 points    Immunizations Immunization History  Administered Date(s) Administered   Influenza Split 10/01/2011   Influenza Whole 08/25/2007, 09/08/2008, 08/03/2009, 08/20/2010   Influenza, High Dose Seasonal PF 07/03/2015, 07/07/2016, 07/16/2017, 07/21/2018, 08/04/2019   Influenza, Seasonal, Injecte, Preservative Fre 10/11/2012   Influenza,inj,Quad PF,6+ Mos 10/18/2013, 09/19/2014   Influenza-Unspecified 08/27/2020, 07/05/2021   Moderna Covid-19 Vaccine Bivalent Booster 66yr & up 09/30/2021   Moderna SARS-COV2 Booster Vaccination 08/27/2020, 03/27/2021   Moderna Sars-Covid-2 Vaccination 11/08/2019, 12/05/2019   Pneumococcal Conjugate-13 01/02/2014   Pneumococcal Polysaccharide-23 10/27/2000, 08/25/2007, 01/15/2018   Td 10/27/1997, 08/30/2008   Tdap 08/04/2019   Zoster, Live 03/04/2013    TDAP status: Up to date  Flu Vaccine status: Up to date  Pneumococcal vaccine status: Up to date  Covid-19 vaccine status: Information provided  on how to obtain vaccines.   Qualifies for Shingles Vaccine? Yes   Zostavax completed Yes   Shingrix Completed?: No.    Education has been provided regarding the importance of this vaccine. Patient has been advised to call insurance company to determine out of pocket expense if they have not yet received this vaccine. Advised may also receive vaccine at local pharmacy or Health Dept. Verbalized acceptance and understanding.  Screening Tests Health Maintenance  Topic Date Due   Zoster Vaccines- Shingrix (1 of 2) Never done   OPHTHALMOLOGY EXAM  07/18/2022   Medicare Annual Wellness (AWV)  09/02/2022   INFLUENZA VACCINE  01/25/2023 (Originally 05/27/2022)   HEMOGLOBIN A1C  12/30/2022   FOOT EXAM  07/02/2023   TETANUS/TDAP  08/03/2029   Pneumonia Vaccine 23+ Years old  Completed   DEXA SCAN   Completed   HPV VACCINES  Aged Out   COVID-19 Vaccine  Discontinued    Health Maintenance  Health Maintenance Due  Topic Date Due   Zoster Vaccines- Shingrix (1 of 2) Never done   OPHTHALMOLOGY EXAM  07/18/2022   Medicare Annual Wellness (AWV)  09/02/2022    Colorectal cancer screening: No longer required.   Mammogram status: Completed 12/30/21. Repeat every year  Bone Density status: Completed 07/04/21. Results reflect: Bone density results: OSTEOPENIA. Repeat every 2 years.  Lung Cancer Screening: (Low Dose CT Chest recommended if Age 14-80 years, 30 pack-year currently smoking OR have quit w/in 15years.) does not qualify.    Additional Screening:  Hepatitis C Screening: does not qualify  Vision Screening: Recommended annual ophthalmology exams for early detection of glaucoma and other disorders of the eye. Is the patient up to date with their annual eye exam?  Yes  Who is the provider or what is the name of the office in which the patient attends annual eye exams? Dr. Prudencio Burly If pt is not established with a provider, would they like to be referred to a provider to establish care? No .   Dental Screening: Recommended annual dental exams for proper oral hygiene  Community Resource Referral / Chronic Care Management: CRR required this visit?  No   CCM required this visit?  No      Plan:     I have personally reviewed and noted the following in the patient's chart:   Medical and social history Use of alcohol, tobacco or illicit drugs  Current medications and supplements including opioid prescriptions. Patient is not currently taking opioid prescriptions. Functional ability and status Nutritional status Physical activity Advanced directives List of other physicians Hospitalizations, surgeries, and ER visits in previous 12 months Vitals Screenings to include cognitive, depression, and falls Referrals and appointments  In addition, I have reviewed and discussed with  patient certain preventive protocols, quality metrics, and best practice recommendations. A written personalized care plan for preventive services as well as general preventive health recommendations were provided to patient.   Due to this being a telephonic visit, the after visit summary with patients personalized plan was offered to patient via mail or my-chart. Patient would like to access on my-chart.  Beatris Ship, [place]   09/05/2022   Nurse Notes: None  I have reviewed and agree with Health Coaches documentation.  [name], MD

## 2022-09-05 NOTE — Patient Instructions (Signed)
Angela Clay , Thank you for taking time to come for your Medicare Wellness Visit. I appreciate your ongoing commitment to your health goals. Please review the following plan we discussed and let me know if I can assist you in the future.   These are the goals we discussed:  Goals       Blood pressure goal <140/90      Check blood pressure 2 to 3 times per week      maintain healthy active lifestyle (pt-stated)      Patient Stated      Start goin to the gym      [place]      Pharmacist Clinical Goal(s):  Over the next 90 days, patient will achieve control of gout as evidenced by no acute episodes of gout contact provider office for questions/concerns as evidenced notation of same in electronic health record through collaboration with PharmD and provider.   Interventions: 1:1 collaboration with Colon Branch, MD regarding development and update of comprehensive plan of care as evidenced by provider attestation and co-signature Inter-disciplinary care team collaboration (see longitudinal plan of care) Comprehensive medication review performed; medication list updated in electronic medical record  Pre - Diabetes: Lab Results  Component Value Date   HGBA1C 6.2 09/12/2021  Controlled; A1c goal < 6.5% Current treatment: diet and exercise;  Interventions:  Review last A1c and goal Continue to limit intake of high carbohydrate foods Restart exercise - goal is to get 150 minutes of physical activity per weekly Reminded patient to have annual diabetic eye exam   Hypertension: Controlled; blood pressure goal <140/90 BP Readings from Last 3 Encounters:  01/21/22 (!) 142/86  01/01/22 118/80  09/12/21 124/72  Current treatment: Hydrochlorothiazide 72m daily in morning  Interventions:  Discussed blood pressure goals Recommended checking blood pressure once per week   Hyperlipidemia / CAD: Controlled; LDL goal <70 Lipid Panel     Component Value Date/Time   CHOL 133  01/21/2022 1045   TRIG 157.0 (H) 01/21/2022 1045   HDL 50.80 01/21/2022 1045   CHOLHDL 3 01/21/2022 1045   VLDL 31.4 01/21/2022 1045   LDLCALC 51 01/21/2022 1045   LDLDIRECT 118.5 08/25/2007 0000  Current treatment: atorvastatin 44mdaily Interventions:  Continue current therapy for lowering risk of recurrent heart disease and to keep LDL <70  Gout:  Current treatment:  Colchicine 0.69m12mp to twice a day as needed  Tart Cherry Capsules 1200m35mily Interventions:  Continue Tart Cherry Juice / capsules daily and avoid foods that can increase uric acid Continue to limit foods that can increase uric acid - tomatoes, red meat, shellfish, sweets and alcohol.  Osteopenia / low bone mass:  Last DEXA 07/04/2021 showed osteopenia Current treatment Calcium + D once a day Multivitamin once daily Has taken alendronate in past 2007 to 2012 Interventions:  Discussed weight bearing exercise - recommend adding 2 to 3 sessions per week Continue to take supplements for bone health - remember to take supplements at least 2 hours before or after levothyroxine.    Patient Goals/Self-Care Activities Over the next 90 days, patient will:  take medications as prescribed check blood pressure weekly, document, and provide at future appointments,  engage in dietary modifications by limiting foods that can increase uric acid levels / gout attacks (tomatoes, shell fish, alcohol, red meat, sweets) Restart exercise with goal of 150 minutes per week with 2 to 3 days of weight bearing exercise to help with bone health.   Follow  Up Plan: Telephone follow up appointment with care management team member scheduled for:  3 to 4 months       Record how often you have gout attacks      -If you are often bothered by gout, we can consider starting allopurinol daily for prevention of gout attacks      Start taking Calcium Citrate 675m once daily      This will help you get the appropriate amount of calcium in  addition to the calcium provided in your multivitamin        This is a list of the screening recommended for you and due dates:  Health Maintenance  Topic Date Due   Zoster (Shingles) Vaccine (1 of 2) Never done   Eye exam for diabetics  07/18/2022   Flu Shot  01/25/2023*   Hemoglobin A1C  12/30/2022   Complete foot exam   07/02/2023   Medicare Annual Wellness Visit  09/06/2023   Tetanus Vaccine  08/03/2029   Pneumonia Vaccine  Completed   DEXA scan (bone density measurement)  Completed   HPV Vaccine  Aged Out   COVID-19 Vaccine  Discontinued  *Topic was postponed. The date shown is not the original due date.     Next appointment: Follow up in one year for your annual wellness visit    Preventive Care 65 Years and Older, Female Preventive care refers to lifestyle choices and visits with your health care provider that can promote health and wellness. What does preventive care include? A yearly physical exam. This is also called an annual well check. Dental exams once or twice a year. Routine eye exams. Ask your health care provider how often you should have your eyes checked. Personal lifestyle choices, including: Daily care of your teeth and gums. Regular physical activity. Eating a healthy diet. Avoiding tobacco and drug use. Limiting alcohol use. Practicing safe sex. Taking low-dose aspirin every day. Taking vitamin and mineral supplements as recommended by your health care provider. What happens during an annual well check? The services and screenings done by your health care provider during your annual well check will depend on your age, overall health, lifestyle risk factors, and family history of disease. Counseling  Your health care provider may ask you questions about your: Alcohol use. Tobacco use. Drug use. Emotional well-being. Home and relationship well-being. Sexual activity. Eating habits. History of falls. Memory and ability to understand  (cognition). Work and work eStatistician Reproductive health. Screening  You may have the following tests or measurements: Height, weight, and BMI. Blood pressure. Lipid and cholesterol levels. These may be checked every 5 years, or more frequently if you are over 547years old. Skin check. Lung cancer screening. You may have this screening every year starting at age 2961if you have a 30-pack-year history of smoking and currently smoke or have quit within the past 15 years. Fecal occult blood test (FOBT) of the stool. You may have this test every year starting at age 86 Flexible sigmoidoscopy or colonoscopy. You may have a sigmoidoscopy every 5 years or a colonoscopy every 10 years starting at age 86 Hepatitis C blood test. Hepatitis B blood test. Sexually transmitted disease (STD) testing. Diabetes screening. This is done by checking your blood sugar (glucose) after you have not eaten for a while (fasting). You may have this done every 1-3 years. Bone density scan. This is done to screen for osteoporosis. You may have this done starting at age 86 Mammogram. This may be  done every 1-2 years. Talk to your health care provider about how often you should have regular mammograms. Talk with your health care provider about your test results, treatment options, and if necessary, the need for more tests. Vaccines  Your health care provider may recommend certain vaccines, such as: Influenza vaccine. This is recommended every year. Tetanus, diphtheria, and acellular pertussis (Tdap, Td) vaccine. You may need a Td booster every 10 years. Zoster vaccine. You may need this after age 4. Pneumococcal 13-valent conjugate (PCV13) vaccine. One dose is recommended after age 23. Pneumococcal polysaccharide (PPSV23) vaccine. One dose is recommended after age 30. Talk to your health care provider about which screenings and vaccines you need and how often you need them. This information is not intended to  replace advice given to you by your health care provider. Make sure you discuss any questions you have with your health care provider. Document Released: 11/09/2015 Document Revised: 07/02/2016 Document Reviewed: 08/14/2015 Elsevier Interactive Patient Education  2017 [place] Prevention in the Home Falls can cause injuries. They can happen to people of all ages. There are many things you can do to make your home safe and to help prevent falls. What can I do on the outside of my home? Regularly fix the edges of walkways and driveways and fix any cracks. Remove anything that might make you trip as you walk through a door, such as a raised step or threshold. Trim any bushes or trees on the path to your home. Use bright outdoor lighting. Clear any walking paths of anything that might make someone trip, such as rocks or tools. Regularly check to see if handrails are loose or broken. Make sure that both sides of any steps have handrails. Any raised decks and porches should have guardrails on the edges. Have any leaves, snow, or ice cleared regularly. Use sand or salt on walking paths during winter. Clean up any spills in your garage right away. This includes oil or grease spills. What can I do in the bathroom? Use night lights. Install grab bars by the toilet and in the tub and shower. Do not use towel bars as grab bars. Use non-skid mats or decals in the tub or shower. If you need to sit down in the shower, use a plastic, non-slip stool. Keep the floor dry. Clean up any water that spills on the floor as soon as it happens. Remove soap buildup in the tub or shower regularly. Attach bath mats securely with double-sided non-slip rug tape. Do not have throw rugs and other things on the floor that can make you trip. What can I do in the bedroom? Use night lights. Make sure that you have a light by your bed that is easy to reach. Do not use any sheets or blankets that are too big for  your bed. They should not hang down onto the floor. Have a firm chair that has side arms. You can use this for support while you get dressed. Do not have throw rugs and other things on the floor that can make you trip. What can I do in the kitchen? Clean up any spills right away. Avoid walking on wet floors. Keep items that you use a lot in easy-to-reach places. If you need to reach something above you, use a strong step stool that has a grab bar. Keep electrical cords out of the way. Do not use floor polish or wax that makes floors slippery. If you must use wax,  use non-skid floor wax. Do not have throw rugs and other things on the floor that can make you trip. What can I do with my stairs? Do not leave any items on the stairs. Make sure that there are handrails on both sides of the stairs and use them. Fix handrails that are broken or loose. Make sure that handrails are as long as the stairways. Check any carpeting to make sure that it is firmly attached to the stairs. Fix any carpet that is loose or worn. Avoid having throw rugs at the top or bottom of the stairs. If you do have throw rugs, attach them to the floor with carpet tape. Make sure that you have a light switch at the top of the stairs and the bottom of the stairs. If you do not have them, ask someone to add them for you. What else can I do to help prevent falls? Wear shoes that: Do not have high heels. Have rubber bottoms. Are comfortable and fit you well. Are closed at the toe. Do not wear sandals. If you use a stepladder: Make sure that it is fully opened. Do not climb a closed stepladder. Make sure that both sides of the stepladder are locked into place. Ask someone to hold it for you, if possible. Clearly mark and make sure that you can see: Any grab bars or handrails. First and last steps. Where the edge of each step is. Use tools that help you move around (mobility aids) if they are needed. These  include: Canes. Walkers. Scooters. Crutches. Turn on the lights when you go into a dark area. Replace any light bulbs as soon as they burn out. Set up your furniture so you have a clear path. Avoid moving your furniture around. If any of your floors are uneven, fix them. If there are any pets around you, be aware of where they are. Review your medicines with your doctor. Some medicines can make you feel dizzy. This can increase your chance of falling. Ask your doctor what other things that you can do to help prevent falls. This information is not intended to replace advice given to you by your health care provider. Make sure you discuss any questions you have with your health care provider. Document Released: 08/09/2009 Document Revised: 03/20/2016 Document Reviewed: 11/17/2014 Elsevier Interactive Patient Education  2017 [employer].

## 2022-10-06 ENCOUNTER — Telehealth: Payer: Self-pay | Admitting: Internal Medicine

## 2022-10-06 ENCOUNTER — Ambulatory Visit (INDEPENDENT_AMBULATORY_CARE_PROVIDER_SITE_OTHER): Payer: Medicare HMO | Admitting: Pharmacist

## 2022-10-06 DIAGNOSIS — E785 Hyperlipidemia, unspecified: Secondary | ICD-10-CM

## 2022-10-06 DIAGNOSIS — I10 Essential (primary) hypertension: Secondary | ICD-10-CM

## 2022-10-06 DIAGNOSIS — M109 Gout, unspecified: Secondary | ICD-10-CM

## 2022-10-06 MED ORDER — COLCHICINE 0.6 MG PO CAPS: 1.0000 | ORAL_CAPSULE | Freq: Two times a day (BID) | ORAL | 0 refills | Status: DC | PRN

## 2022-10-06 NOTE — Telephone Encounter (Signed)
[facility] opthalmology was calling Angela Clay back. They just wanted her to know they did not have pt listed as diabetic and therefore did not do a diabetic eye exam.

## 2022-10-06 NOTE — Telephone Encounter (Signed)
Noted  

## 2022-10-06 NOTE — Patient Instructions (Signed)
Ms Staub, It was a pleasure speaking with you today.  Below is a summary of your health goals and summary of our recent visit.   Hypertension/ High Blood Pressure: Controlled Continue to check blood pressure once a week.  Blood pressure goal is < 130/80 Continue hydrochlorothiazide 38m once daily.     Gout: Controlled.  Continue tart cherry tablets daily and colchicine as needed.  Sent in updated prescription for colchicine to [facility].   Knee pain:  Updated medication list - acetaminophen 6540m- can take up to 4 per day as needed.  Continue to use Voltaren gel as needed up to 4 times a day Contact either Dr PaLarose Kellsr [facility] orthopedist if you continue to have knee pain.   Hyperlipidemia / High cholesterol: LDL at goal of < 70; Triglycerides a little elevated - goal is < 150 Continue atorvastatin 4034maily.    As always if you have any questions or concerns especially regarding medications, please feel free to contact me either at the phone number below or with a MyChart message.   Keep up the good work!  TamCherre RobinsharmD Clinical Pharmacist [facility] [phone] line)  [phone] office number)

## 2022-10-06 NOTE — Progress Notes (Signed)
Pharmacy Note  10/06/2022 Name: AARUSHI HEMRIC MRN: [id number] DOB: 1934/10/25  Subjective: Angela Clay is a 88 y.o. year old female who is a primary care patient of Colon Branch, MD. Clinical Pharmacist Practitioner referral was placed to assist with medication management.    Engaged with patient by telephone for follow up visit today.   Patient was widowed 01/23/2022. She report she is doing well. Sleep has improved.   Hypertension: checks blood pressure once a week. Today blood pressure was 133/80. She is taking hydrochlorothiazide 44m once daily.    Gout: No recent acute gout episodes. Continues to take tart cherry tablets daily. Has not needed to to take colchicine recently but she did ask for an updated prescription to have colchicine on hand.  Knee pain: occurs after going to gym for 2 days. She thinks she might have pushed too hard. She has been taking acetaminophen 6539mtwice a day but was not sure if this was too much. She also is considering follow up with [facility] orthopedist.  Hyperlipidemia: Continues to take atorvastatin 4010maily. Last LDL was at goal but triglycerides a little elevated.    Patient also reports getting her flu vaccine in October 2023 and eye exam recently. Sees Dr LylPrudencio Burlyr annual eye exam.   Objective: Review of patient status, including review of consultants reports, laboratory and other test data, was performed as part of comprehensive.  Lab Results  Component Value Date   CREATININE 0.81 07/01/2022   CREATININE 0.79 01/01/2022   CREATININE 0.81 09/12/2021    Lab Results  Component Value Date   HGBA1C 6.6 (H) 07/01/2022       Component Value Date/Time   CHOL 133 01/21/2022 1045   TRIG 157.0 (H) 01/21/2022 1045   HDL 50.80 01/21/2022 1045   CHOLHDL 3 01/21/2022 1045   VLDL 31.4 01/21/2022 1045   LDLCALC 51 01/21/2022 1045   LDLDIRECT 118.5 08/25/2007 0000     Clinical ASCVD: Yes  The ASCVD Risk score (Arnett DK, et al.,  2019) failed to calculate for the following reasons:   The 2019 ASCVD risk score is only valid for ages 40 47 79 53 BP Readings from Last 3 Encounters:  07/01/22 130/68  01/21/22 (!) 142/86  01/01/22 118/80     Allergies  Allergen Reactions   Codeine Nausea And Vomiting    Medications Reviewed Today     Reviewed by EckCherre RobinsPH-CPP (Pharmacist) on 10/06/22 at 1020  Med List Status: <None>   Medication Order Taking? Sig Documenting Provider Last Dose Status Informant  ACETAMINOPHEN PO [id number] Take 650 mg by mouth every 6 (six) hours as needed for mild pain. [provider] Taking Active Self           Med Note (ECBarbaraann Boysc 11, 2023 10:11 AM)    aspirin 81 MG tablet [id number] Take 81 mg by mouth at bedtime. [provider] Taking Active Self  atorvastatin (LIPITOR) 40 MG tablet [id number] TAKE 1 TABLET BY MOUTH AT BEDTIME PazColon BranchD Taking Active   Calcium Carb-Cholecalciferol (CALCIUM 600 + D PO) [id number] Take 1 tablet by mouth daily. [provider] Taking Active Self  Cholecalciferol (VITAMIN D3) 50 MCG (2000 UT) CAPS [id number] Take 2,000 Units by mouth daily. [provider] Taking Active   Colchicine (MITIGARE) 0.6 MG CAPS [id number] Take 1 capsule by mouth 2 (two) times daily as  needed.  Patient not taking: Reported on 10/06/2022   Colon Branch, MD Not Taking Active   diclofenac Sodium (VOLTAREN ARTHRITIS PAIN) 1 % GEL [id number] Yes Apply 2 g topically 4 (four) times daily. [provider] Taking Active   hydrochlorothiazide (HYDRODIURIL) 25 MG tablet [id number] Yes Take 1 tablet (25 mg total) by mouth daily. Colon Branch, MD Taking Active   levothyroxine (SYNTHROID) 100 MCG tablet [id number] Yes TAKE 1 TABLET BY MOUTH ONCE DAILY BEFORE BREAKFAST Colon Branch, MD Taking Active   Multiple Vitamins-Minerals (CENTRUM SILVER 50+WOMEN) TABS [id number] Yes Take 1 tablet by mouth daily. [provider] Taking Active Self  omeprazole (PRILOSEC) 20 MG capsule [id number] Yes Take 1 capsule (20 mg total) by mouth daily as needed. Colon Branch, MD Taking Active   Polyethyl Glycol-Propyl Glycol (SYSTANE ULTRA OP) [id number] Yes Place 1 drop into both eyes daily as needed (Dry eyes). [provider] Taking Active Self  Tart Cherry 1200 MG CAPS [id number] Yes Take 1 capsule by mouth daily. [provider] Taking Active             Patient Active Problem List   Diagnosis Date Noted   Autoimmune hepatitis ([place]) 04/01/2020   Candida esophagitis ([place]) 12/31/2019   Chronic gastritis without bleeding 12/31/2019   Elevated alkaline phosphatase level 10/14/2019   Pancreatic cyst 10/14/2019   Dilated bile duct 10/14/2019   Positive antismooth muscle antibody  08/08/2019   Abnormal ultrasound of liver 07/07/2019   Abnormal LFTs 07/05/2019   Abnormal SPEP 06/22/2019   Pericarditis 08/09/2018   ACS (acute coronary syndrome) ([place]) 07/19/2018   Shingles 12/04/2015   Follow-up-----------------PCP notes  07/03/2015   Prolapse of female pelvic organs 03/11/2013   Dystrophy, vulva-- s/p bx, on clobetasol prn 03/11/2013   Monoarthritis of hand 03/10/2013   Diabetes mellitus without complication ([employer]) 63/81/7711   Annual physical exam 02/19/2011   Carotid arterial disease ([place]) 02/19/2011   DEGENERATIVE JOINT DISEASE 04/26/2010   Hypothyroidism 01/11/2008   LYMPHOMA NEC, MLIG, UNSPECIFIED SITE 08/25/2007   Hyperlipidemia 08/25/2007   Essential hypertension 08/25/2007   Gastroesophageal reflux disease 08/25/2007   Osteopenia 08/25/2007   THYROIDECTOMY, HX OF 08/25/2007     Medication Assistance:  None required.  Patient affirms current coverage meets needs.   Assessment / Plan: Hypertension: Controlled Continue to check blood pressure once a week.  Blood pressure goal is < 130/80 Continue hydrochlorothiazide 54m once daily.   Consider adding low dose ARB / ACEi  if blood pressure > 130/80  Gout: Controlled.  Continue tart cherry tablets daily and colchicine as needed.  Sent in updated Rx for colchicine to [facility].   Knee pain:  Updated med list - acetaminophen 6573m- can take up to 4 per day as needed.  Updated to add over-the-counter voltaren gel.  Recommended she contact either Dr PaLarose Kellsr [facility] orthopedist if she continues to have pain.   Hyperlipidemia: LDL at goal; Triglycerides a little elevated Continues atorvastatin 4016maily.  Medication management:  Reviewed and updated medication list Reviewed refill history and adherence  Health Maintenance:  Requested copy of eye exam from 2023 from Dr LylPrudencio Burlyfice. Had to LM on VM with our fax number [phone] health maintenance with information about annual flu vaccine administration.  Discussed getting Shingrix and updated COVID vaccine.   Follow Up:  No further follow up required: Patient has met all medication related goals. She is aware she can contact Clinical Pharmacist Practitioner if  she has future medication related questions or concerns.    Cherre Robins, PharmD Clinical Pharmacist [place] [phone]

## 2022-10-14 DIAGNOSIS — Z85828 Personal history of other malignant neoplasm of skin: Secondary | ICD-10-CM | POA: Diagnosis not present

## 2022-10-14 DIAGNOSIS — L570 Actinic keratosis: Secondary | ICD-10-CM | POA: Diagnosis not present

## 2022-10-14 DIAGNOSIS — L720 Epidermal cyst: Secondary | ICD-10-CM | POA: Diagnosis not present

## 2022-10-14 DIAGNOSIS — L814 Other melanin hyperpigmentation: Secondary | ICD-10-CM | POA: Diagnosis not present

## 2022-10-31 ENCOUNTER — Telehealth: Payer: Self-pay | Admitting: Internal Medicine

## 2022-10-31 MED ORDER — CARVEDILOL 6.25 MG PO TABS: 6.2500 mg | ORAL_TABLET | Freq: Two times a day (BID) | ORAL | 0 refills | Status: DC

## 2022-10-31 NOTE — Telephone Encounter (Signed)
Pt has appt 11/04/22. Please advise?

## 2022-10-31 NOTE — Telephone Encounter (Signed)
Spoke w/ Pt- informed of recommendations. Pt verbalized understanding.  

## 2022-10-31 NOTE — Addendum Note (Signed)
Addended byDamita Dunnings D on: 10/31/2022 04:28 PM   Modules accepted: Orders

## 2022-10-31 NOTE — Telephone Encounter (Signed)
Recommend carvedilol 6.25 1 p.o. twice daily, send #60 no refills. This is likely a temporary medication. Continue checking BPs, if they drop below 120/80: Okay to stop carvedilol. Keep the appointment with me next week If symptoms such as chest pain, headache, nosebleed: ER.   BP Readings from Last 3 Encounters:  07/01/22 130/68  01/21/22 (!) 142/86  01/01/22 118/80

## 2022-10-31 NOTE — Telephone Encounter (Signed)
Patient said she got a steriod injection in her knee one day this week and today her blood pressure has been high and she wants to know what to do.   Readings have been 174/82 and 179/100.   Please call to advise.

## 2022-11-04 ENCOUNTER — Ambulatory Visit (INDEPENDENT_AMBULATORY_CARE_PROVIDER_SITE_OTHER): Payer: Medicare HMO | Admitting: Internal Medicine

## 2022-11-04 ENCOUNTER — Encounter: Payer: Self-pay | Admitting: Internal Medicine

## 2022-11-04 VITALS — BP 122/84 | HR 66 | Temp 98.0°F | Resp 18 | Ht 63.0 in | Wt 160.0 lb

## 2022-11-04 DIAGNOSIS — E119 Type 2 diabetes mellitus without complications: Secondary | ICD-10-CM

## 2022-11-04 DIAGNOSIS — I10 Essential (primary) hypertension: Secondary | ICD-10-CM | POA: Diagnosis not present

## 2022-11-04 DIAGNOSIS — E039 Hypothyroidism, unspecified: Secondary | ICD-10-CM

## 2022-11-04 DIAGNOSIS — Z0000 Encounter for general adult medical examination without abnormal findings: Secondary | ICD-10-CM

## 2022-11-04 DIAGNOSIS — E785 Hyperlipidemia, unspecified: Secondary | ICD-10-CM | POA: Diagnosis not present

## 2022-11-04 LAB — BASIC METABOLIC PANEL
BUN: 22 mg/dL (ref 6–23)
CO2: 33 mEq/L — ABNORMAL HIGH (ref 19–32)
Calcium: 9.8 mg/dL (ref 8.4–10.5)
Chloride: 99 mEq/L (ref 96–112)
Creatinine, Ser: 0.9 mg/dL (ref 0.40–1.20)
GFR: 57.71 mL/min — ABNORMAL LOW (ref >60.00)
Glucose, Bld: 133 mg/dL — ABNORMAL HIGH (ref 70–99)
Potassium: 4 mEq/L (ref 3.5–5.1)
Sodium: 142 mEq/L (ref 135–145)

## 2022-11-04 LAB — LIPID PANEL
Cholesterol: 152 mg/dL (ref 0–200)
HDL: 61.9 mg/dL (ref >39.00)
LDL Cholesterol: 63 mg/dL (ref 0–99)
NonHDL: 90.49
Total CHOL/HDL Ratio: 2
Triglycerides: 135 mg/dL (ref 0.0–149.0)
VLDL: 27 mg/dL (ref 0.0–40.0)

## 2022-11-04 LAB — HEMOGLOBIN A1C: Hgb A1c MFr Bld: 7 % — ABNORMAL HIGH (ref 4.6–6.5)

## 2022-11-04 LAB — AST: AST: 17 U/L (ref 0–37)

## 2022-11-04 LAB — TSH: TSH: 3.28 u[IU]/mL (ref 0.35–5.50)

## 2022-11-04 LAB — ALT: ALT: 24 U/L (ref 0–35)

## 2022-11-04 NOTE — Assessment & Plan Note (Signed)
Here for CPX. Chronic medical problems include diabetes, HTN, high cholesterol, hypothyroidism, osteopenia, all seem to be controlled. Good med compliance.  No change Last T-score -1.8 (06-2021) by gynecology.  On vitamin D. Clinical pharmacist saw the patient several times, she is at goal.  Will follow-up with them as needed Social: Lost her husband last year, having good days and bad days but in general feeling well.  Has excellent family support.  Lives independently in a one-story house, still drives, going to [place] tomorrow. RTC 6 months

## 2022-11-04 NOTE — Assessment & Plan Note (Signed)
-  Tdap 2020 -PNM 25  booster 12-2017; prevnar 2015; PNM 20: declines today - shingles shot  02-2013 , shingrex recommended  - RSV: recommended  - covid vax recommended  - had a flu shot  --CCS: no recall  - Female care: to see gyn prn  --MMG 12/2021([facility])  -- life style: Discussed - labs: BMP AST ALT FLP A1c TSH - POA on file -Information about fall prevention provided

## 2022-11-04 NOTE — Patient Instructions (Addendum)
Vaccines I recommend:  Shingrix (shingles) RSV vaccine Pneumonia shot (PNM 20).  Check the  blood pressure regularly BP GOAL is between 110/65 and  135/85. If it is consistently higher or lower, let me know   GO TO THE LAB : Get the blood work     [place] back for   a checkup in 6 months  Per our records you are due for your diabetic eye exam. Please contact your eye doctor to schedule an appointment. Please have them send copies of your office visit notes to [place]. Our fax number is [phone]. If you need a referral to an eye doctor please let us know.   Fall Prevention in the Home, Adult Falls can cause injuries and affect people of all ages. There are many simple things that you can do to make your home safe and to help prevent falls. Ask for help when making these changes, if needed. What actions can I take to prevent falls? General instructions Use good lighting in all rooms. Replace any light bulbs that burn out, turn on lights if it is dark, and use night-lights. Place frequently used items in easy-to-reach places. Lower the shelves around your home if necessary. Set up furniture so that there are clear paths around it. Avoid moving your furniture around. Remove throw rugs and other tripping hazards from the floor. Avoid walking on wet floors. Fix any uneven floor surfaces. Add color or contrast paint or tape to grab bars and handrails in your home. Place contrasting color strips on the first and last steps of staircases. When you use a stepladder, make sure that it is completely opened and that the sides and supports are firmly locked. Have someone hold the ladder while you are using it. Do not climb a closed stepladder. Know where your pets are when moving through your home. What can I do in the bathroom?     Keep the floor dry. Immediately clean up any water that is on the floor. Remove soap buildup in the tub or  shower regularly. Use nonskid mats or decals on the floor of the tub or shower. Attach bath mats securely with double-sided, nonslip rug tape. If you need to sit down while you are in the shower, use a plastic, nonslip stool. Install grab bars by the toilet and in the tub and shower. Do not use towel bars as grab bars. What can I do in the bedroom? Make sure that a bedside light is easy to reach. Do not use oversized bedding that reaches the floor. Have a firm chair that has side arms to use for getting dressed. What can I do in the kitchen? Clean up any spills right away. If you need to reach for something above you, use a sturdy step stool that has a grab bar. Keep electrical cables out of the way. Do not use floor polish or wax that makes floors slippery. If you must use wax, make sure that it is non-skid floor wax. What can I do with my stairs? Do not leave any items on the stairs. Make sure that you have a light switch at the top and the bottom of the stairs. Have them installed if you do not have them. Make sure that there are handrails on both sides of the stairs. Fix handrails that are broken or loose. Make sure that handrails are as long as the staircases. Install non-slip stair treads on all stairs  in your home. Avoid having throw rugs at the top or bottom of stairs, or secure the rugs with carpet tape to prevent them from moving. Choose a carpet design that does not hide the edge of steps on the stairs. Check any carpeting to make sure that it is firmly attached to the stairs. Fix any carpet that is loose or worn. What can I do on the outside of my home? Use bright outdoor lighting. Regularly repair the edges of walkways and driveways and fix any cracks. Remove high doorway thresholds. Trim any shrubbery on the main path into your home. Regularly check that handrails are securely fastened and in good repair. Both sides of all steps should have handrails. Install guardrails  along the edges of any raised decks or porches. Clear walkways of debris and clutter, including tools and rocks. Have leaves, snow, and ice cleared regularly. Use sand or salt on walkways during winter months. In the garage, clean up any spills right away, including grease or oil spills. What other actions can I take? Wear closed-toe shoes that fit well and support your feet. Wear shoes that have rubber soles or low heels. Use mobility aids as needed, such as canes, walkers, scooters, and crutches. Review your medicines with your health care provider. Some medicines can cause dizziness or changes in blood pressure, which increase your risk of falling. Talk with your health care provider about other ways that you can decrease your risk of falls. This may include working with a physical therapist or trainer to improve your strength, balance, and endurance. Where to find more information Centers for Disease Control and Prevention, [employer]: http://www.wolf.info/ National Institute on Aging: http://kim-miller.com/ Contact a health care provider if: You are afraid of falling at home. You feel weak, drowsy, or dizzy at home. You fall at home. Summary There are many simple things that you can do to make your home safe and to help prevent falls. Ways to make your home safe include removing tripping hazards and installing grab bars in the bathroom. Ask for help when making these changes in your home. This information is not intended to replace advice given to you by your health care provider. Make sure you discuss any questions you have with your health care provider. Document Revised: 07/15/2021 Document Reviewed: 05/16/2020 Elsevier Patient Education  [place].

## 2022-11-04 NOTE — Progress Notes (Signed)
Subjective:    Patient ID: Angela Clay, female    DOB: Sep 07, 1934, 89 y.o.   MRN: [id number]  DOS:  11/04/2022 Type of visit - description: CPX  Here for CPX. In general feeling well and has no major concerns.  Review of Systems   A 14 point review of systems is negative.  No unusual or severe symptoms   Past Medical History:  Diagnosis Date   Actinic keratosis 05/2015   Right mid central dorsal hand   Autoimmune hepatitis ([place])    Gallstones    GERD (gastroesophageal reflux disease)    EGD for dysphagia 2000 aprox (-)   History of radiation therapy    Hyperlipidemia    Hypertension    Hypothyroidism    Lymphoma ([place])    neck,mlig,unspecified site dx 1970s   OA (osteoarthritis)    severe at neck   Osteopenia    Pancreatic cyst    Syncope 11/2012   admited . w/u (-)   Thyroid cancer [facility])     Past Surgical History:  Procedure Laterality Date   APPENDECTOMY     BELPHAROPTOSIS REPAIR Bilateral 2017   BIOPSY  11/23/2019   Procedure: BIOPSY;  Surgeon: Irving Copas., MD;  Location: [facility];  Service: Gastroenterology;;   CATARACT EXTRACTION, BILATERAL Bilateral 01/10/2019   Dr.Rankin   CHOLECYSTECTOMY  02/24/2006   ESOPHAGOGASTRODUODENOSCOPY (EGD) WITH PROPOFOL N/A 11/23/2019   Procedure: ESOPHAGOGASTRODUODENOSCOPY (EGD) WITH PROPOFOL;  Surgeon: Irving Copas., MD;  Location: [facility];  Service: Gastroenterology;  Laterality: N/A;   EXPLORATORY LAPAROTOMY     for lymphadenomopathy; appendix removed at this time   LEFT HEART CATH AND CORONARY ANGIOGRAPHY N/A 07/20/2018   Procedure: LEFT HEART CATH AND CORONARY ANGIOGRAPHY;  Surgeon: Belva Crome, MD;  Location: [facility];  Service: Cardiovascular;  Laterality: N/A;   LIVER BIOPSY     THYROIDECTOMY     TONSILLECTOMY AND ADENOIDECTOMY  1949   TUBAL LIGATION  1972   UPPER ESOPHAGEAL ENDOSCOPIC ULTRASOUND (EUS) N/A 11/23/2019   Procedure: UPPER ESOPHAGEAL ENDOSCOPIC ULTRASOUND  (EUS);  Surgeon: Irving Copas., MD;  Location: [facility];  Service: Gastroenterology;  Laterality: N/A;  WITH BIOPSY   NEEDS 90 MINUTES   vitamacular hole surgery Right 2020   eye   Social History   Socioeconomic History   Marital status: Widowed    Spouse name: Iona Beard   Number of children: 3   Years of education: Not on file   Highest education level: Not on file  Occupational History   Occupation: retired  Tobacco Use   Smoking status: Never   Smokeless tobacco: Never  Vaping Use   Vaping Use: Never used  Substance and Sexual Activity   Alcohol use: No    Alcohol/week: 0.0 standard drinks of alcohol   Drug use: No   Sexual activity: Not Currently    Partners: Male    Birth control/protection: Post-menopausal  Other Topics Concern   Not on file  Social History Narrative   3 kids, 68 [place] a g-child to suicide 2017   Lost her husband 01/23/2022   Social Determinants of Health   Financial Resource Strain: Low Risk  (05/05/2022)   Overall Financial Resource Strain (CARDIA)    Difficulty of Paying Living Expenses: Not hard at all  Food Insecurity: No Food Insecurity (09/05/2022)   Hunger Vital Sign    Worried About Running Out of Food in the Last Year: Never true  Ran Out of Food in the Last Year: Never true  Transportation Needs: No Transportation Needs (09/05/2022)   PRAPARE - Hydrologist (Medical): No    Lack of Transportation (Non-Medical): No  Physical Activity: Inactive (05/05/2022)   Exercise Vital Sign    Days of Exercise per Week: 0 days    Minutes of Exercise per Session: 0 min  Stress: No Stress Concern Present (09/05/2022)   [place]    Feeling of Stress : Only a little  Social Connections: Moderately Integrated (05/05/2022)   Social Connection and Isolation Panel [NHANES]    Frequency of Communication with Friends and Family: Three times a  week    Frequency of Social Gatherings with Friends and Family: Three times a week    Attends Religious Services: More than 4 times per year    Active Member of Clubs or Organizations: Yes    Attends Archivist Meetings: More than 4 times per year    Marital Status: Widowed  Intimate Partner Violence: Not At Risk (09/02/2021)   Humiliation, Afraid, Rape, and Kick questionnaire    Fear of Current or Ex-Partner: No    Emotionally Abused: No    Physically Abused: No    Sexually Abused: No    Current Outpatient Medications  Medication Instructions   ACETAMINOPHEN PO 650 mg, Oral, Every 6 hours PRN   aspirin 81 mg, Oral, Daily at bedtime,     atorvastatin (LIPITOR) 40 MG tablet TAKE 1 TABLET BY MOUTH AT BEDTIME   Calcium Carb-Cholecalciferol (CALCIUM 600 + D PO) 1 tablet, Oral, Daily   carvedilol (COREG) 6.25 mg, Oral, 2 times daily with meals   diclofenac Sodium (VOLTAREN ARTHRITIS PAIN) 2 g, Topical, 4 times daily   fluorouracil (EFUDEX) 5 % cream Daily   hydrochlorothiazide (HYDRODIURIL) 25 mg, Oral, Daily   levothyroxine (SYNTHROID) 100 mcg, Oral, Daily before breakfast   Multiple Vitamins-Minerals (CENTRUM SILVER 50+WOMEN) TABS 1 tablet, Oral, Daily   omeprazole (PRILOSEC) 20 mg, Oral, Daily PRN   Polyethyl Glycol-Propyl Glycol (SYSTANE ULTRA OP) 1 drop, Both Eyes, Daily PRN   Tart Cherry 1200 MG CAPS 1 capsule, Oral, Daily   vitamin D3 2,000 Units, Oral, Daily       Objective:   Physical Exam BP 122/84   Pulse 66   Temp 98 F (36.7 C) (Oral)   Resp 18   Ht '5\' 3"'$  (1.6 m)   Wt 160 lb (72.6 kg)   SpO2 96%   BMI 28.34 kg/m  General: Well developed, NAD, BMI noted Neck: No mass. HEENT:  Normocephalic . Face symmetric, atraumatic Lungs:  CTA B Normal respiratory effort, no intercostal retractions, no accessory muscle use. Heart: RRR,  no murmur.   Lower extremities: no pretibial edema bilaterally  Skin: Exposed areas without rash. Not pale. Not  jaundice Neurologic:  alert & oriented X3.  Speech normal, gait appropriate for age and unassisted Strength symmetric and appropriate for age.  Psych: Cognition and judgment appear intact.  Cooperative with normal attention span and concentration.  Behavior appropriate. No anxious or depressed appearing.     Assessment     Assessment DM A1c 6.8 (04-2019) Hyperlipidemia HTN Hypothyroidism Gout  Autoimmune hepatitis Carotid artery disease [place] 11-2014: 60-79% RICA stenosis. 35-46% LICA stenosis [place] 02/6811: 1-39 % B. 1 year [place] 06/2017 1-39%, no further routine [place] DJD, severe at the neck GERD, dysphagia remotely Osteopenia: -s/p fosamax 2007 to 2012. -T  score -1.7 (01-2015)  -T score -1.8 (04-2017)  - T-score -1.8 (06-2021) + Vitamin D deficiency Lymphoma,  Neck, 1970s Syncope, admitted-2014, workup negative Shingles: 10/02/2016, left arm Chest pain: Cath 07/19/2018, negative, pericarditis?  PLAN: Here for CPX. Chronic medical problems include diabetes, HTN, high cholesterol, hypothyroidism, osteopenia, all seem to be controlled. Good med compliance.  No change Last T-score -1.8 (06-2021) by gynecology.  On vitamin D. Clinical pharmacist saw the patient several times, she is at goal.  Will follow-up with them as needed Social: Lost her husband last year, having good days and bad days but in general feeling well.  Has excellent family support.  Lives independently in a one-story house, still drives, going to [place] tomorrow. RTC 6 months

## 2022-11-21 ENCOUNTER — Other Ambulatory Visit: Payer: Self-pay | Admitting: Internal Medicine

## 2022-12-02 ENCOUNTER — Other Ambulatory Visit: Payer: Self-pay | Admitting: Internal Medicine

## 2022-12-08 DIAGNOSIS — Z0100 Encounter for examination of eyes and vision without abnormal findings: Secondary | ICD-10-CM | POA: Diagnosis not present

## 2023-01-05 DIAGNOSIS — Z1231 Encounter for screening mammogram for malignant neoplasm of breast: Secondary | ICD-10-CM | POA: Diagnosis not present

## 2023-01-05 LAB — HM MAMMOGRAPHY

## 2023-01-06 ENCOUNTER — Encounter: Payer: Self-pay | Admitting: Internal Medicine

## 2023-01-12 DIAGNOSIS — E1122 Type 2 diabetes mellitus with diabetic chronic kidney disease: Secondary | ICD-10-CM | POA: Diagnosis not present

## 2023-01-12 DIAGNOSIS — M810 Age-related osteoporosis without current pathological fracture: Secondary | ICD-10-CM | POA: Diagnosis not present

## 2023-01-12 DIAGNOSIS — E890 Postprocedural hypothyroidism: Secondary | ICD-10-CM | POA: Diagnosis not present

## 2023-01-12 DIAGNOSIS — E785 Hyperlipidemia, unspecified: Secondary | ICD-10-CM | POA: Diagnosis not present

## 2023-01-12 DIAGNOSIS — Z8249 Family history of ischemic heart disease and other diseases of the circulatory system: Secondary | ICD-10-CM | POA: Diagnosis not present

## 2023-01-12 DIAGNOSIS — Z823 Family history of stroke: Secondary | ICD-10-CM | POA: Diagnosis not present

## 2023-01-12 DIAGNOSIS — I700 Atherosclerosis of aorta: Secondary | ICD-10-CM | POA: Diagnosis not present

## 2023-01-12 DIAGNOSIS — Z9181 History of falling: Secondary | ICD-10-CM | POA: Diagnosis not present

## 2023-01-12 DIAGNOSIS — I2510 Atherosclerotic heart disease of native coronary artery without angina pectoris: Secondary | ICD-10-CM | POA: Diagnosis not present

## 2023-01-12 DIAGNOSIS — M1990 Unspecified osteoarthritis, unspecified site: Secondary | ICD-10-CM | POA: Diagnosis not present

## 2023-01-12 DIAGNOSIS — N189 Chronic kidney disease, unspecified: Secondary | ICD-10-CM | POA: Diagnosis not present

## 2023-01-12 DIAGNOSIS — Z008 Encounter for other general examination: Secondary | ICD-10-CM | POA: Diagnosis not present

## 2023-01-12 DIAGNOSIS — K219 Gastro-esophageal reflux disease without esophagitis: Secondary | ICD-10-CM | POA: Diagnosis not present

## 2023-01-13 DIAGNOSIS — Z85828 Personal history of other malignant neoplasm of skin: Secondary | ICD-10-CM | POA: Diagnosis not present

## 2023-01-13 DIAGNOSIS — L720 Epidermal cyst: Secondary | ICD-10-CM | POA: Diagnosis not present

## 2023-01-13 DIAGNOSIS — L570 Actinic keratosis: Secondary | ICD-10-CM | POA: Diagnosis not present

## 2023-01-13 DIAGNOSIS — L814 Other melanin hyperpigmentation: Secondary | ICD-10-CM | POA: Diagnosis not present

## 2023-01-15 DIAGNOSIS — M170 Bilateral primary osteoarthritis of knee: Secondary | ICD-10-CM | POA: Diagnosis not present

## 2023-01-18 ENCOUNTER — Other Ambulatory Visit: Payer: Self-pay | Admitting: Internal Medicine

## 2023-01-27 DIAGNOSIS — M13862 Other specified arthritis, left knee: Secondary | ICD-10-CM | POA: Diagnosis not present

## 2023-01-27 DIAGNOSIS — M1712 Unilateral primary osteoarthritis, left knee: Secondary | ICD-10-CM | POA: Diagnosis not present

## 2023-02-03 ENCOUNTER — Other Ambulatory Visit: Payer: Self-pay | Admitting: Internal Medicine

## 2023-02-05 DIAGNOSIS — M1712 Unilateral primary osteoarthritis, left knee: Secondary | ICD-10-CM | POA: Diagnosis not present

## 2023-02-10 DIAGNOSIS — M25562 Pain in left knee: Secondary | ICD-10-CM | POA: Diagnosis not present

## 2023-02-10 DIAGNOSIS — M1712 Unilateral primary osteoarthritis, left knee: Secondary | ICD-10-CM | POA: Diagnosis not present

## 2023-03-17 ENCOUNTER — Telehealth: Payer: Self-pay | Admitting: Internal Medicine

## 2023-03-17 MED ORDER — COLCHICINE 0.6 MG PO CAPS: 1.0000 | ORAL_CAPSULE | Freq: Two times a day (BID) | ORAL | 0 refills | Status: DC | PRN

## 2023-03-17 NOTE — Addendum Note (Signed)
Addended by: Willow Ora E on: 03/17/2023 01:58 PM   Modules accepted: Orders

## 2023-03-17 NOTE — Telephone Encounter (Signed)
Please advise- colchicine no longer on med list.

## 2023-03-17 NOTE — Telephone Encounter (Signed)
History of gout, okay to refill.

## 2023-03-17 NOTE — Telephone Encounter (Signed)
Pt called to see if Dr. Drue Novel can call in a refill of the generic mitigare. Pt said she is beginning to have a gout flare up. Please send to [facility] on [address].

## 2023-05-05 ENCOUNTER — Ambulatory Visit (INDEPENDENT_AMBULATORY_CARE_PROVIDER_SITE_OTHER): Payer: Medicare HMO | Admitting: Internal Medicine

## 2023-05-05 ENCOUNTER — Encounter: Payer: Self-pay | Admitting: Internal Medicine

## 2023-05-05 VITALS — BP 124/62 | HR 72 | Temp 97.8°F | Resp 16 | Ht 63.0 in | Wt 159.1 lb

## 2023-05-05 DIAGNOSIS — E039 Hypothyroidism, unspecified: Secondary | ICD-10-CM

## 2023-05-05 DIAGNOSIS — I10 Essential (primary) hypertension: Secondary | ICD-10-CM | POA: Diagnosis not present

## 2023-05-05 DIAGNOSIS — E119 Type 2 diabetes mellitus without complications: Secondary | ICD-10-CM

## 2023-05-05 LAB — CBC WITH DIFFERENTIAL/PLATELET
Basophils Absolute: 0.1 10*3/uL (ref 0.0–0.1)
Basophils Relative: 1 % (ref 0.0–3.0)
Eosinophils Absolute: 0.2 10*3/uL (ref 0.0–0.7)
Eosinophils Relative: 3.4 % (ref 0.0–5.0)
HCT: 39.5 % (ref 36.0–46.0)
Hemoglobin: 13 g/dL (ref 12.0–15.0)
Lymphocytes Relative: 19 % (ref 12.0–46.0)
Lymphs Abs: 1.2 10*3/uL (ref 0.7–4.0)
MCHC: 32.9 g/dL (ref 30.0–36.0)
MCV: 90.5 fl (ref 78.0–100.0)
Monocytes Absolute: 0.4 10*3/uL (ref 0.1–1.0)
Monocytes Relative: 6.8 % (ref 3.0–12.0)
Neutro Abs: 4.2 10*3/uL (ref 1.4–7.7)
Neutrophils Relative %: 69.8 % (ref 43.0–77.0)
Platelets: 215 10*3/uL (ref 150.0–400.0)
RBC: 4.36 Mil/uL (ref 3.87–5.11)
RDW: 14.7 % (ref 11.5–15.5)
WBC: 6.1 10*3/uL (ref 4.0–10.5)

## 2023-05-05 LAB — TSH: TSH: 4.14 u[IU]/mL (ref 0.35–5.50)

## 2023-05-05 LAB — BASIC METABOLIC PANEL
BUN: 18 mg/dL (ref 6–23)
CO2: 30 mEq/L (ref 19–32)
Calcium: 10.3 mg/dL (ref 8.4–10.5)
Chloride: 100 mEq/L (ref 96–112)
Creatinine, Ser: 0.81 mg/dL (ref 0.40–1.20)
GFR: 65.26 mL/min (ref >60.00)
Glucose, Bld: 112 mg/dL — ABNORMAL HIGH (ref 70–99)
Potassium: 4.8 mEq/L (ref 3.5–5.1)
Sodium: 139 mEq/L (ref 135–145)

## 2023-05-05 LAB — HEMOGLOBIN A1C: Hgb A1c MFr Bld: 6.1 % (ref 4.6–6.5)

## 2023-05-05 NOTE — Progress Notes (Unsigned)
Subjective:    Patient ID: Angela Clay, female    DOB: [birth year], 89 y.o.   MRN: [id number]  DOS:  05/05/2023 Type of visit - description: Follow-up  Chronic medical problems addressed. In general she feels well. Taking Tylenol for MSK issues, wonders if that is okay. BP was temporarily elevated, took carvedilol for few days, BPs are now back to normal.  Denies chest pain or difficulty breathing No nausea vomiting No gross hematuria No dysphagia.   Review of Systems See above   Past Medical History:  Diagnosis Date   Actinic keratosis 05/2015   Right mid central dorsal hand   Autoimmune hepatitis ([facility])    Gallstones    GERD (gastroesophageal reflux disease)    EGD for dysphagia 2000 aprox (-)   History of radiation therapy    Hyperlipidemia    Hypertension    Hypothyroidism    Lymphoma ([facility])    neck,mlig,unspecified site dx 1970s   OA (osteoarthritis)    severe at neck   Osteopenia    Pancreatic cyst    Syncope 11/2012   admited . w/u (-)   Thyroid cancer [facility])     Past Surgical History:  Procedure Laterality Date   APPENDECTOMY     BELPHAROPTOSIS REPAIR Bilateral 2017   BIOPSY  11/23/2019   Procedure: BIOPSY;  Surgeon: Lemar Lofty., MD;  Location: [facility];  Service: Gastroenterology;;   CATARACT EXTRACTION, BILATERAL Bilateral 01/10/2019   Dr.Rankin   CHOLECYSTECTOMY  02/24/2006   ESOPHAGOGASTRODUODENOSCOPY (EGD) WITH PROPOFOL N/A 11/23/2019   Procedure: ESOPHAGOGASTRODUODENOSCOPY (EGD) WITH PROPOFOL;  Surgeon: Lemar Lofty., MD;  Location: [facility];  Service: Gastroenterology;  Laterality: N/A;   EXPLORATORY LAPAROTOMY     for lymphadenomopathy; appendix removed at this time   LEFT HEART CATH AND CORONARY ANGIOGRAPHY N/A 07/20/2018   Procedure: LEFT HEART CATH AND CORONARY ANGIOGRAPHY;  Surgeon: Lyn Records, MD;  Location: [facility];  Service: Cardiovascular;  Laterality: N/A;   LIVER BIOPSY     THYROIDECTOMY      TONSILLECTOMY AND ADENOIDECTOMY  1949   TUBAL LIGATION  1972   UPPER ESOPHAGEAL ENDOSCOPIC ULTRASOUND (EUS) N/A 11/23/2019   Procedure: UPPER ESOPHAGEAL ENDOSCOPIC ULTRASOUND (EUS);  Surgeon: Lemar Lofty., MD;  Location: [facility];  Service: Gastroenterology;  Laterality: N/A;  WITH BIOPSY   NEEDS 90 MINUTES   vitamacular hole surgery Right 2020   eye    Current Outpatient Medications  Medication Instructions   ACETAMINOPHEN PO 650 mg, Oral, Every 6 hours PRN   aspirin 81 mg, Oral, Daily at bedtime,     atorvastatin (LIPITOR) 40 mg, Oral, Daily at bedtime   Calcium Carb-Cholecalciferol (CALCIUM 600 + D PO) 1 tablet, Oral, Daily   carvedilol (COREG) 6.25 mg, Oral, 2 times daily with meals   Colchicine (MITIGARE) 0.6 mg, Oral, 2 times daily PRN   fluorouracil (EFUDEX) 5 % cream Daily   hydrochlorothiazide (HYDRODIURIL) 25 mg, Oral, Daily   levothyroxine (SYNTHROID) 100 mcg, Oral, Daily before breakfast   Menthol, Topical Analgesic, (BIOFREEZE) 4 % GEL Apply externally   Multiple Vitamins-Minerals (CENTRUM SILVER 50+WOMEN) TABS 1 tablet, Oral, Daily   omeprazole (PRILOSEC) 20 mg, Oral, Daily PRN   Polyethyl Glycol-Propyl Glycol (SYSTANE ULTRA OP) 1 drop, Both Eyes, Daily PRN   Tart Cherry 1200 MG CAPS 1 capsule, Oral, Daily   vitamin D3 2,000 Units, Oral, Daily       Objective:   Physical Exam BP 124/62   Pulse  72   Temp 97.8 F (36.6 C) (Oral)   Resp 16   Ht 5\' 3"  (1.6 m)   Wt 159 lb 2 oz (72.2 kg)   SpO2 95%   BMI 28.19 kg/m  General:   Well developed, NAD, BMI noted. HEENT:  Normocephalic . Face symmetric, atraumatic Lungs:  CTA B Normal respiratory effort, no intercostal retractions, no accessory muscle use. Heart: RRR,  no murmur.  Lower extremities: no pretibial edema bilaterally  Skin: Not pale. Not jaundice Neurologic:  alert & oriented X3.  Speech normal, gait appropriate for age and unassisted Psych--  Cognition and judgment appear  intact.  Cooperative with normal attention span and concentration.  Behavior appropriate. No anxious or depressed appearing.      Assessment    Assessment DM A1c 6.8 (04-2019) Hyperlipidemia HTN Hypothyroidism Gout  Autoimmune hepatitis Carotid artery disease [place] 11-2014: 60-79% RICA stenosis. 40-59% LICA stenosis [place] 02/2016: 1-39 % B. 1 year [place] 06/2017 1-39%, no further routine [place] DJD, severe at the neck GERD, dysphagia remotely Osteopenia: -s/p fosamax 2007 to 2012. -T score -1.7 (01-2015)  -T score -1.8 (04-2017)  - T-score -1.8 (06-2021) + Vitamin D deficiency Lymphoma,  Neck, 1970s Syncope, admitted-2014, workup negative Shingles: 10/02/2016, left arm Chest pain: Cath 07/19/2018, negative, pericarditis?  PLAN: DM: Diet controlled, last A1c 7.0, she is trying to eat healthier with less carbohydrates.  Check A1c.  Further advised for results. Hyperlipidemia: On atorvastatin, well-controlled. HTN: On HCTZ.  After a local steroid injection, BP was elevated, took carvedilol temporarily.  BP is now WNL.  Check BMP and CBC Hypothyroidism: On Synthroid, last TSH ~ 3.0 , recheck labs today, consider adjust medications to get TSH ~ 1.0. DJD: On Tylenol as needed, takes 2 tablets daily, that is okay, she is not overdosing. GERD: On Prilosec, denies dysphagia Vitamin D deficiency: Last level satisfactory. Vaccine advice provided. RTC 6 months.

## 2023-05-05 NOTE — Patient Instructions (Addendum)
Vaccines I recommend to take at the pharmacy: Pneumonia shot (PNM 20)  Shingrix (shingles) RSV vaccine Flu shot this fall  Check the  blood pressure regularly BP GOAL is between 110/65 and  135/85. If it is consistently higher or lower, let me know    GO TO THE LAB : Get the blood work     GO TO THE FRONT DESK, PLEASE SCHEDULE YOUR APPOINTMENTS Come back for   a physical exam in 6 months

## 2023-05-06 NOTE — Assessment & Plan Note (Signed)
DM: Diet controlled, last A1c 7.0, she is trying to eat healthier with less carbohydrates.  Check A1c.  Further advised for results. Hyperlipidemia: On atorvastatin, well-controlled. HTN: On HCTZ.  After a local steroid injection, BP was elevated, took carvedilol temporarily.  BP is now WNL.  Check BMP and CBC Hypothyroidism: On Synthroid, last TSH ~ 3.0 , recheck labs today, consider adjust medications to get TSH ~ 1.0. DJD: On Tylenol as needed, takes 2 tablets daily, that is okay, she is not overdosing. GERD: On Prilosec, denies dysphagia Vitamin D deficiency: Last level satisfactory. Vaccine advice provided. RTC 6 months.

## 2023-05-07 MED ORDER — LEVOTHYROXINE SODIUM 125 MCG PO TABS: 125.0000 ug | ORAL_TABLET | Freq: Every day | ORAL | 1 refills | Status: DC

## 2023-05-07 NOTE — Addendum Note (Signed)
Addended byConrad Farmington D on: 05/07/2023 11:34 AM   Modules accepted: Orders

## 2023-05-23 ENCOUNTER — Other Ambulatory Visit: Payer: Self-pay | Admitting: Internal Medicine

## 2023-06-15 ENCOUNTER — Other Ambulatory Visit (INDEPENDENT_AMBULATORY_CARE_PROVIDER_SITE_OTHER): Payer: Medicare HMO

## 2023-06-15 DIAGNOSIS — E039 Hypothyroidism, unspecified: Secondary | ICD-10-CM

## 2023-06-15 LAB — TSH: TSH: 0.67 u[IU]/mL (ref 0.35–5.50)

## 2023-06-16 ENCOUNTER — Ambulatory Visit (INDEPENDENT_AMBULATORY_CARE_PROVIDER_SITE_OTHER): Payer: Medicare HMO | Admitting: Radiology

## 2023-06-16 ENCOUNTER — Encounter: Payer: Self-pay | Admitting: Radiology

## 2023-06-16 VITALS — BP 118/70 | Ht 63.0 in | Wt 157.0 lb

## 2023-06-16 DIAGNOSIS — Z01419 Encounter for gynecological examination (general) (routine) without abnormal findings: Secondary | ICD-10-CM | POA: Diagnosis not present

## 2023-06-16 DIAGNOSIS — M8589 Other specified disorders of bone density and structure, multiple sites: Secondary | ICD-10-CM

## 2023-06-16 NOTE — Progress Notes (Signed)
   VIVECA RAITZ May 19, 1934 [id number]   History: Postmenopausal 89 y.o. presents for breast and pelvic exam. Hx of osteopenia, last scan 2022. No gyn concerns. Husband passed away last year.   Gynecologic History Postmenopausal Last Pap: 2018. Results were: normal Last mammogram: 3/24. Results were: normal Last colonoscopy: 2014 DEXA:2022 Osteopenia HRT use: no  Obstetric History OB History  Gravida Para Term Preterm AB Living  4 3 3   1 3   SAB IAB Ectopic Multiple Live Births  1       3    # Outcome Date GA Lbr Len/2nd Weight Sex Type Anes PTL Lv  4 Term 08/26/71 [redacted]w[redacted]d  7 lb (3.175 kg) F Vag-Spont None N LIV  3 Term 06/10/63 [redacted]w[redacted]d  7 lb (3.175 kg) F Vag-Spont Gen N LIV  2 Term 03/09/61 [redacted]w[redacted]d  7 lb (3.175 kg) M Vag-Spont Gen N LIV  1 SAB 1959     SAB        The following portions of the patient's history were reviewed and updated as appropriate: allergies, current medications, past family history, past medical history, past social history, past surgical history, and problem list.  Review of Systems Pertinent items noted in HPI and remainder of comprehensive ROS otherwise negative.  Past medical history, past surgical history, family history and social history were all reviewed and documented in the EPIC chart.  Exam:  Vitals:   06/16/23 0946  BP: 118/70  Weight: 157 lb (71.2 kg)  Height: 5\' 3"  (1.6 m)   Body mass index is 27.81 kg/m.  General appearance:  Normal Thyroid:  Symmetrical, normal in size, without palpable masses or nodularity. Respiratory  Auscultation:  Clear without wheezing or rhonchi Cardiovascular  Auscultation:  Regular rate, without rubs, murmurs or gallops  Edema/varicosities:  Not grossly evident Abdominal  Soft,nontender, without masses, guarding or rebound.  Liver/spleen:  No organomegaly noted  Hernia:  None appreciated  Skin  Inspection:  Grossly normal Breasts: Examined lying and sitting.   Right: Without masses,  retractions, nipple discharge or axillary adenopathy.   Left: Without masses, retractions, nipple discharge or axillary adenopathy. Genitourinary   Inguinal/mons:  Normal without inguinal adenopathy  External genitalia:  Normal appearing vulva with no masses, tenderness, or lesions  BUS/Urethra/Skene's glands:  Normal  Vagina:  Normal appearing with normal color and discharge, no lesions. Atrophy: moderate   Cervix:  Normal appearing without discharge or lesions  Uterus:  Normal in size, shape and contour.  Midline and mobile, nontender  Adnexa/parametria:     Rt: Normal in size, without masses or tenderness.   Lt: Normal in size, without masses or tenderness.  Anus and perineum: Normal    Raynelle Fanning, CMA present for exam  Assessment/Plan:   1. Well woman exam with routine gynecological exam Paps d/c'd  2. Osteopenia of multiple sites - DG Bone Density; Future    Discussed SBE, colonoscopy and DEXA screening as directed. Recommend of exercise weekly, including weight bearing exercise. Encouraged the use of seatbelts and sunscreen.  Return in 1 year for annual or sooner prn.  Arlie Solomons B WHNP-BC, 10:10 AM 06/16/2023

## 2023-07-01 DIAGNOSIS — D485 Neoplasm of uncertain behavior of skin: Secondary | ICD-10-CM | POA: Diagnosis not present

## 2023-07-01 DIAGNOSIS — L720 Epidermal cyst: Secondary | ICD-10-CM | POA: Diagnosis not present

## 2023-07-01 DIAGNOSIS — D225 Melanocytic nevi of trunk: Secondary | ICD-10-CM | POA: Diagnosis not present

## 2023-07-01 DIAGNOSIS — C44722 Squamous cell carcinoma of skin of right lower limb, including hip: Secondary | ICD-10-CM | POA: Diagnosis not present

## 2023-07-01 DIAGNOSIS — L821 Other seborrheic keratosis: Secondary | ICD-10-CM | POA: Diagnosis not present

## 2023-07-01 DIAGNOSIS — L80 Vitiligo: Secondary | ICD-10-CM | POA: Diagnosis not present

## 2023-07-01 DIAGNOSIS — Z85828 Personal history of other malignant neoplasm of skin: Secondary | ICD-10-CM | POA: Diagnosis not present

## 2023-07-01 DIAGNOSIS — L819 Disorder of pigmentation, unspecified: Secondary | ICD-10-CM | POA: Diagnosis not present

## 2023-07-03 ENCOUNTER — Other Ambulatory Visit: Payer: Self-pay | Admitting: Internal Medicine

## 2023-07-07 DIAGNOSIS — C4492 Squamous cell carcinoma of skin, unspecified: Secondary | ICD-10-CM | POA: Insufficient documentation

## 2023-07-09 ENCOUNTER — Inpatient Hospital Stay (HOSPITAL_BASED_OUTPATIENT_CLINIC_OR_DEPARTMENT_OTHER): Admission: RE | Admit: 2023-07-09 | Payer: Medicare HMO | Source: Ambulatory Visit

## 2023-07-20 ENCOUNTER — Ambulatory Visit (HOSPITAL_BASED_OUTPATIENT_CLINIC_OR_DEPARTMENT_OTHER)
Admission: RE | Admit: 2023-07-20 | Discharge: 2023-07-20 | Disposition: A | Discharge status: Home / Self Care | Payer: Medicare HMO | Source: Ambulatory Visit | Attending: Radiology | Admitting: Radiology

## 2023-07-20 DIAGNOSIS — M8589 Other specified disorders of bone density and structure, multiple sites: Secondary | ICD-10-CM | POA: Diagnosis not present

## 2023-07-20 DIAGNOSIS — M8580 Other specified disorders of bone density and structure, unspecified site: Secondary | ICD-10-CM

## 2023-07-20 DIAGNOSIS — Z780 Asymptomatic menopausal state: Secondary | ICD-10-CM | POA: Diagnosis not present

## 2023-07-20 DIAGNOSIS — M85852 Other specified disorders of bone density and structure, left thigh: Secondary | ICD-10-CM | POA: Diagnosis not present

## 2023-07-20 HISTORY — DX: Other specified disorders of bone density and structure, unspecified site: M85.80

## 2023-07-23 ENCOUNTER — Encounter: Payer: Self-pay | Admitting: Obstetrics and Gynecology

## 2023-07-24 ENCOUNTER — Other Ambulatory Visit: Payer: Self-pay | Admitting: Internal Medicine

## 2023-08-03 DIAGNOSIS — Z961 Presence of intraocular lens: Secondary | ICD-10-CM | POA: Diagnosis not present

## 2023-08-03 DIAGNOSIS — H52203 Unspecified astigmatism, bilateral: Secondary | ICD-10-CM | POA: Diagnosis not present

## 2023-08-03 LAB — HM DIABETES EYE EXAM

## 2023-08-04 ENCOUNTER — Encounter: Payer: Self-pay | Admitting: Internal Medicine

## 2023-08-10 NOTE — Progress Notes (Unsigned)
GYNECOLOGY  VISIT   HPI: 89 y.o.   Widowed  Caucasian  female   [phone] with No LMP recorded. Patient is postmenopausal.   here for  discuss dexa done 07/20/23.   She is taking 1800 mg calcium per day.  Calcium 1200 mg + 25 mg vit D, calcium 600 mg, and vit D 2000 internation units.   Hx candida esophagitis.  Taking Omeprazole.  Has some difficulty swallowing at times but generally not.  Had 2 endoscopies.   She stopped going to [employer].   Study Result  Narrative & Impression  EXAM: DUAL X-RAY ABSORPTIOMETRY (DXA) FOR BONE MINERAL DENSITY   IMPRESSION: Angela Clay   Your patient Angela Clay completed a BMD test on 07/20/2023 using the Lunar IDXA DXA System (analysis version: 16.SP2) manufactured by [employer]. The following summarizes the results of our evaluation. [facility]   PATIENT: Name: Angela Clay Patient ID: [id number] Birth Date: 23-Mar-1934 Height: 63.0 in. Gender: Female Measured: 07/20/2023 Weight: 156.2 lbs. Indications: Advanced Age, Caucasian, Estrogen Deficiency, History of Osteopenia, Hypothyroidism, Post Menopausal Fractures: Ankle, Wrist Treatments: Caltrate 600 + D, Levothyroxine, Vitamin D   ASSESSMENT: The BMD measured at Femur Neck Left is 0.767 g/cm2 with a T-score of -2.0. This patient is considered osteopenic according to World Health Organization [facility]) criteria. L-3 & 4 was excluded due to degenerative changes. Compared with the prior study on, 07/04/2021 the BMD of the total mean shows no statistically significant change. The scan quality is good.   Site Region Measured Date Measured Age WHO YA BMD Classification T-score AP Spine L1-L2 07/20/2025 87.6 Normal -0.8 1.069 g/cm2 AP Spine L1-L2 07/04/2021 85.6 Low Bone Mass -1.1 1.036 g/cm2 AP Spine L1-L2 05/14/2017 81.4 Normal -0.6 1.087 g/cm2   DualFemur Neck Left 07/20/2023 87.6 Low Bone Mass -2.0 0.767 g/cm2 DualFemur Neck Left 07/04/2021 85.6 Low Bone Mass -1.8 0.785  g/cm2 DualFemur Neck Left 05/14/2017 81.4 Low Bone Mass -1.8 0.789 g/cm2   DualFemur Total Mean 07/20/2023 87.6 Low Bone Mass -1.4 0.829 g/cm2 DualFemur Total Mean 07/04/2021 85.6 Low Bone Mass -1.4 0.828 g/cm2 DualFemur Total Mean 05/14/2017 81.4 Normal -0.7 0.915 g/cm2   World Health Organization [facility]) criteria for post-menopausal, Caucasian Women: Normal        T-score at or above -1 SD Low Bone Mass T-score between -1 and -2.5 SD Osteoporosis  T-score at or below -2.5 SD   RECOMMENDATION: 1. All patients should optimize calcium and vitamin D intake. 2. Consider FDA-approved medical therapies in postmenopausal women and men aged 68 years and older, based on the following: a. A hip or vertebral(clinical or morphometric) fracture. b. T-Score < -2.5 at the femoral neck or spine after appropriate evaluation to exclude secondary causes c. Low bone mass (T-score between -1.0 and -2.5 at the femoral neck or spine) and a 10 year probability of a hip fracture >3% or a 10 year probability of major osteoporosis-related fracture > 20% based on the [place]-adapted WHO algorithm d. Clinical judgement and/or patient preferences may indicate treatment for people with 10-year fracture probabilities above or below these levels   FOLLOW-UP: Patients with diagnosis of osteoporosis or at high risk for fracture should have regular bone mineral density tests. For patients eligible for Medicare, routine testing is allowed once every 2 years. The testing frequency can be increased to one year for patients who have rapidly progressing disease, those who are receiving or discontinuing medical therapy to restore bone mass, or have additional risk factors.   I  GYNECOLOGY  VISIT   HPI: 89 y.o.   Widowed  Caucasian  female   [phone] with No LMP recorded. Patient is postmenopausal.   here for  discuss dexa done 07/20/23.   She is taking 1800 mg calcium per day.  Calcium 1200 mg + 25 mg vit D, calcium 600 mg, and vit D 2000 internation units.   Hx candida esophagitis.  Taking Omeprazole.  Has some difficulty swallowing at times but generally not.  Had 2 endoscopies.   She stopped going to [employer].   Study Result  Narrative & Impression  EXAM: DUAL X-RAY ABSORPTIOMETRY (DXA) FOR BONE MINERAL DENSITY   IMPRESSION: Angela Clay   Your patient Angela Clay completed a BMD test on 07/20/2023 using the Lunar IDXA DXA System (analysis version: 16.SP2) manufactured by [employer]. The following summarizes the results of our evaluation. [facility]   PATIENT: Name: Angela Clay Patient ID: [id number] Birth Date: 23-Mar-1934 Height: 63.0 in. Gender: Female Measured: 07/20/2023 Weight: 156.2 lbs. Indications: Advanced Age, Caucasian, Estrogen Deficiency, History of Osteopenia, Hypothyroidism, Post Menopausal Fractures: Ankle, Wrist Treatments: Caltrate 600 + D, Levothyroxine, Vitamin D   ASSESSMENT: The BMD measured at Femur Neck Left is 0.767 g/cm2 with a T-score of -2.0. This patient is considered osteopenic according to World Health Organization [facility]) criteria. L-3 & 4 was excluded due to degenerative changes. Compared with the prior study on, 07/04/2021 the BMD of the total mean shows no statistically significant change. The scan quality is good.   Site Region Measured Date Measured Age WHO YA BMD Classification T-score AP Spine L1-L2 07/20/2023 87.6 Normal -0.8 1.069 g/cm2 AP Spine L1-L2 07/04/2021 85.6 Low Bone Mass -1.1 1.036 g/cm2 AP Spine L1-L2 05/14/2017 81.4 Normal -0.6 1.087 g/cm2   DualFemur Neck Left 07/20/2023 87.6 Low Bone Mass -2.0 0.767 g/cm2 DualFemur Neck Left 07/04/2021 85.6 Low Bone Mass -1.8 0.785  g/cm2 DualFemur Neck Left 05/14/2017 81.4 Low Bone Mass -1.8 0.789 g/cm2   DualFemur Total Mean 07/20/2023 87.6 Low Bone Mass -1.4 0.829 g/cm2 DualFemur Total Mean 07/04/2021 85.6 Low Bone Mass -1.4 0.828 g/cm2 DualFemur Total Mean 05/14/2017 81.4 Normal -0.7 0.915 g/cm2   World Health Organization [facility]) criteria for post-menopausal, Caucasian Women: Normal        T-score at or above -1 SD Low Bone Mass T-score between -1 and -2.5 SD Osteoporosis  T-score at or below -2.5 SD   RECOMMENDATION: 1. All patients should optimize calcium and vitamin D intake. 2. Consider FDA-approved medical therapies in postmenopausal women and men aged 68 years and older, based on the following: a. A hip or vertebral(clinical or morphometric) fracture. b. T-Score < -2.5 at the femoral neck or spine after appropriate evaluation to exclude secondary causes c. Low bone mass (T-score between -1.0 and -2.5 at the femoral neck or spine) and a 10 year probability of a hip fracture >3% or a 10 year probability of major osteoporosis-related fracture > 20% based on the [place]-adapted WHO algorithm d. Clinical judgement and/or patient preferences may indicate treatment for people with 10-year fracture probabilities above or below these levels   FOLLOW-UP: Patients with diagnosis of osteoporosis or at high risk for fracture should have regular bone mineral density tests. For patients eligible for Medicare, routine testing is allowed once every 2 years. The testing frequency can be increased to one year for patients who have rapidly progressing disease, those who are receiving or discontinuing medical therapy to restore bone mass, or have additional risk factors.   I  GYNECOLOGY  VISIT   HPI: 89 y.o.   Widowed  Caucasian  female   [phone] with No LMP recorded. Patient is postmenopausal.   here for  discuss dexa done 07/20/23.   She is taking 1800 mg calcium per day.  Calcium 1200 mg + 25 mg vit D, calcium 600 mg, and vit D 2000 internation units.   Hx candida esophagitis.  Taking Omeprazole.  Has some difficulty swallowing at times but generally not.  Had 2 endoscopies.   She stopped going to [employer].   Study Result  Narrative & Impression  EXAM: DUAL X-RAY ABSORPTIOMETRY (DXA) FOR BONE MINERAL DENSITY   IMPRESSION: Angela Clay   Your patient Angela Clay completed a BMD test on 07/20/2023 using the Lunar IDXA DXA System (analysis version: 16.SP2) manufactured by [employer]. The following summarizes the results of our evaluation. [facility]   PATIENT: Name: Angela Clay Patient ID: [id number] Birth Date: 23-Mar-1934 Height: 63.0 in. Gender: Female Measured: 07/20/2023 Weight: 156.2 lbs. Indications: Advanced Age, Caucasian, Estrogen Deficiency, History of Osteopenia, Hypothyroidism, Post Menopausal Fractures: Ankle, Wrist Treatments: Caltrate 600 + D, Levothyroxine, Vitamin D   ASSESSMENT: The BMD measured at Femur Neck Left is 0.767 g/cm2 with a T-score of -2.0. This patient is considered osteopenic according to World Health Organization [facility]) criteria. L-3 & 4 was excluded due to degenerative changes. Compared with the prior study on, 07/04/2021 the BMD of the total mean shows no statistically significant change. The scan quality is good.   Site Region Measured Date Measured Age WHO YA BMD Classification T-score AP Spine L1-L2 07/20/2023 87.6 Normal -0.8 1.069 g/cm2 AP Spine L1-L2 07/04/2021 85.6 Low Bone Mass -1.1 1.036 g/cm2 AP Spine L1-L2 05/14/2017 81.4 Normal -0.6 1.087 g/cm2   DualFemur Neck Left 07/20/2023 87.6 Low Bone Mass -2.0 0.767 g/cm2 DualFemur Neck Left 07/04/2021 85.6 Low Bone Mass -1.8 0.785  g/cm2 DualFemur Neck Left 05/14/2017 81.4 Low Bone Mass -1.8 0.789 g/cm2   DualFemur Total Mean 07/20/2023 87.6 Low Bone Mass -1.4 0.829 g/cm2 DualFemur Total Mean 07/04/2021 85.6 Low Bone Mass -1.4 0.828 g/cm2 DualFemur Total Mean 05/14/2017 81.4 Normal -0.7 0.915 g/cm2   World Health Organization [facility]) criteria for post-menopausal, Caucasian Women: Normal        T-score at or above -1 SD Low Bone Mass T-score between -1 and -2.5 SD Osteoporosis  T-score at or below -2.5 SD   RECOMMENDATION: 1. All patients should optimize calcium and vitamin D intake. 2. Consider FDA-approved medical therapies in postmenopausal women and men aged 68 years and older, based on the following: a. A hip or vertebral(clinical or morphometric) fracture. b. T-Score < -2.5 at the femoral neck or spine after appropriate evaluation to exclude secondary causes c. Low bone mass (T-score between -1.0 and -2.5 at the femoral neck or spine) and a 10 year probability of a hip fracture >3% or a 10 year probability of major osteoporosis-related fracture > 20% based on the [place]-adapted WHO algorithm d. Clinical judgement and/or patient preferences may indicate treatment for people with 10-year fracture probabilities above or below these levels   FOLLOW-UP: Patients with diagnosis of osteoporosis or at high risk for fracture should have regular bone mineral density tests. For patients eligible for Medicare, routine testing is allowed once every 2 years. The testing frequency can be increased to one year for patients who have rapidly progressing disease, those who are receiving or discontinuing medical therapy to restore bone mass, or have additional risk factors.   I  have reviewed this report, and agree with the above findings.   [facility] Patient: Assunta Gambles Referring Physician: Tanda Rockers Birth Date: 1934/07/05 [age]:       [age] Patient ID: [id number] Height: 63.0 in. Weight: 156.2  lbs. Measured: 07/20/2023 12:55:37 PM (16 SP 4) Gender: Female Ethnicity: White Analyzed: 07/20/2023 1:12:22 PM (16 SP 4)   FRAX* 10-year Probability of Fracture Based on femoral neck BMD: DualFemur (Left)   Major Osteoporotic Fracture: 14.0% Hip Fracture:                4.5% Population:                  [place] (Caucasian) Risk Factors:                None   *FRAX is a Armed forces logistics/support/administrative officer of the [employer] of [employer] for Metabolic Bone Disease, a World Science writer (WHO) [employer].   ASSESSMENT: The probability of a major osteoporotic fracture is 14.0% within the next ten years.   The probability of a hip fracture is 4.5% within the next ten years.     Electronically Signed   By: Baird Lyons M.D.   On: 07/20/2023 13:48      GYNECOLOGIC HISTORY: No LMP recorded. Patient is postmenopausal. Contraception:  PMP Menopausal hormone therapy:  n/a Last mammogram:  01/05/23 Breast Density Cat B, BI-RADS CAT 1 neg Last pap smear:   05/29/17 WNL        OB History     Gravida  4   Para  3   Term  3   Preterm      AB  1   Living  3      SAB  1   IAB      Ectopic      Multiple      Live Births  3              Patient Active Problem List   Diagnosis Date Noted   SCC (squamous cell carcinoma) 07/07/2023   Autoimmune hepatitis (HCC) 04/01/2020   Candida esophagitis (HCC) 12/31/2019   Chronic gastritis without bleeding 12/31/2019   Elevated alkaline phosphatase level 10/14/2019   Pancreatic cyst 10/14/2019   Dilated bile duct 10/14/2019   Positive antismooth muscle antibody  08/08/2019   Abnormal ultrasound of liver 07/07/2019   Abnormal LFTs 07/05/2019   Abnormal SPEP 06/22/2019   Pericarditis 08/09/2018   ACS (acute coronary syndrome) (HCC) 07/19/2018   Shingles 12/04/2015   Follow-up-----------------PCP notes  07/03/2015   Prolapse of female pelvic organs 03/11/2013   Dystrophy, vulva-- s/p bx, on clobetasol prn  03/11/2013   Monoarthritis of hand 03/10/2013   Diabetes mellitus without complication (HCC) 12/07/2012   Annual physical exam 02/19/2011   Carotid arterial disease (HCC) 02/19/2011   Osteoarthritis 04/26/2010   Hypothyroidism 01/11/2008   LYMPHOMA NEC, MLIG, UNSPECIFIED SITE 08/25/2007   Hyperlipidemia 08/25/2007   Essential hypertension 08/25/2007   Gastroesophageal reflux disease 08/25/2007   Osteopenia 08/25/2007   THYROIDECTOMY, HX OF 08/25/2007    Past Medical History:  Diagnosis Date   Actinic keratosis 05/2015   Right mid central dorsal hand   Autoimmune hepatitis (HCC)    Autoimmune hepatitis (HCC)    Gallstones    GERD (gastroesophageal reflux disease)    EGD for dysphagia 2000 aprox (-)   History of radiation therapy    Hyperlipidemia    Hypertension    Hypothyroidism  GYNECOLOGY  VISIT   HPI: 89 y.o.   Widowed  Caucasian  female   [phone] with No LMP recorded. Patient is postmenopausal.   here for  discuss dexa done 07/20/23.   She is taking 1800 mg calcium per day.  Calcium 1200 mg + 25 mg vit D, calcium 600 mg, and vit D 2000 internation units.   Hx candida esophagitis.  Taking Omeprazole.  Has some difficulty swallowing at times but generally not.  Had 2 endoscopies.   She stopped going to [employer].   Study Result  Narrative & Impression  EXAM: DUAL X-RAY ABSORPTIOMETRY (DXA) FOR BONE MINERAL DENSITY   IMPRESSION: Angela Clay   Your patient Angela Clay completed a BMD test on 07/20/2023 using the Lunar IDXA DXA System (analysis version: 16.SP2) manufactured by [employer]. The following summarizes the results of our evaluation. [facility]   PATIENT: Name: Angela Clay Patient ID: [id number] Birth Date: 23-Mar-1934 Height: 63.0 in. Gender: Female Measured: 07/20/2023 Weight: 156.2 lbs. Indications: Advanced Age, Caucasian, Estrogen Deficiency, History of Osteopenia, Hypothyroidism, Post Menopausal Fractures: Ankle, Wrist Treatments: Caltrate 600 + D, Levothyroxine, Vitamin D   ASSESSMENT: The BMD measured at Femur Neck Left is 0.767 g/cm2 with a T-score of -2.0. This patient is considered osteopenic according to World Health Organization [facility]) criteria. L-3 & 4 was excluded due to degenerative changes. Compared with the prior study on, 07/04/2021 the BMD of the total mean shows no statistically significant change. The scan quality is good.   Site Region Measured Date Measured Age WHO YA BMD Classification T-score AP Spine L1-L2 07/20/2023 87.6 Normal -0.8 1.069 g/cm2 AP Spine L1-L2 07/04/2021 85.6 Low Bone Mass -1.1 1.036 g/cm2 AP Spine L1-L2 05/14/2017 81.4 Normal -0.6 1.087 g/cm2   DualFemur Neck Left 07/20/2023 87.6 Low Bone Mass -2.0 0.767 g/cm2 DualFemur Neck Left 07/04/2021 85.6 Low Bone Mass -1.8 0.785  g/cm2 DualFemur Neck Left 05/14/2017 81.4 Low Bone Mass -1.8 0.789 g/cm2   DualFemur Total Mean 07/20/2023 87.6 Low Bone Mass -1.4 0.829 g/cm2 DualFemur Total Mean 07/04/2021 85.6 Low Bone Mass -1.4 0.828 g/cm2 DualFemur Total Mean 05/14/2017 81.4 Normal -0.7 0.915 g/cm2   World Health Organization [facility]) criteria for post-menopausal, Caucasian Women: Normal        T-score at or above -1 SD Low Bone Mass T-score between -1 and -2.5 SD Osteoporosis  T-score at or below -2.5 SD   RECOMMENDATION: 1. All patients should optimize calcium and vitamin D intake. 2. Consider FDA-approved medical therapies in postmenopausal women and men aged 68 years and older, based on the following: a. A hip or vertebral(clinical or morphometric) fracture. b. T-Score < -2.5 at the femoral neck or spine after appropriate evaluation to exclude secondary causes c. Low bone mass (T-score between -1.0 and -2.5 at the femoral neck or spine) and a 10 year probability of a hip fracture >3% or a 10 year probability of major osteoporosis-related fracture > 20% based on the [place]-adapted WHO algorithm d. Clinical judgement and/or patient preferences may indicate treatment for people with 10-year fracture probabilities above or below these levels   FOLLOW-UP: Patients with diagnosis of osteoporosis or at high risk for fracture should have regular bone mineral density tests. For patients eligible for Medicare, routine testing is allowed once every 2 years. The testing frequency can be increased to one year for patients who have rapidly progressing disease, those who are receiving or discontinuing medical therapy to restore bone mass, or have additional risk factors.   I

## 2023-08-17 ENCOUNTER — Ambulatory Visit: Payer: Medicare HMO | Admitting: Internal Medicine

## 2023-08-24 ENCOUNTER — Telehealth: Payer: Self-pay | Admitting: Obstetrics and Gynecology

## 2023-08-24 ENCOUNTER — Ambulatory Visit (INDEPENDENT_AMBULATORY_CARE_PROVIDER_SITE_OTHER): Payer: Medicare HMO | Admitting: Obstetrics and Gynecology

## 2023-08-24 ENCOUNTER — Encounter: Payer: Self-pay | Admitting: Obstetrics and Gynecology

## 2023-08-24 VITALS — BP 128/82 | Ht 63.0 in | Wt 157.0 lb

## 2023-08-24 DIAGNOSIS — M85859 Other specified disorders of bone density and structure, unspecified thigh: Secondary | ICD-10-CM

## 2023-08-24 NOTE — Telephone Encounter (Signed)
Please precert Reclast for osteopenia and increased risk of hip fracture.

## 2023-08-24 NOTE — Telephone Encounter (Signed)
Routing to [employer].

## 2023-08-24 NOTE — Patient Instructions (Signed)
Denosumab Injection (Osteoporosis) What is this medication? DENOSUMAB (den oh SUE mab) prevents and treats osteoporosis. It works by [employer] spatial [employer] stronger and less likely to break (fracture). It is a monoclonal antibody. This medicine may be used for other purposes; ask your health care provider or pharmacist if you have questions. COMMON BRAND NAME(S): Prolia What should I tell my care team before I take this medication? They need to know if you have any of these conditions: Dental or gum disease Had thyroid or parathyroid (glands located in neck) surgery Having dental surgery or a tooth pulled Kidney disease Low levels of calcium in the blood On dialysis Poor nutrition Thyroid disease Trouble absorbing nutrients from your food An unusual or allergic reaction to denosumab, other medications, foods, dyes, or preservatives Pregnant or trying to get pregnant Breastfeeding How should I use this medication? This medication is injected under the skin. It is given by your care team in a hospital or clinic setting. A special MedGuide will be given to you before each treatment. Be sure to read this information carefully each time. Talk to your care team about the use of this medication in children. Special care may be needed. Overdosage: If you think you have taken too much of this medicine contact a poison control center or emergency room at once. NOTE: This medicine is only for you. Do not share this medicine with others. What if I miss a dose? Keep appointments for follow-up doses. It is important not to miss your dose. Call your care team if you are unable to keep an appointment. What may interact with this medication? Do not take this medication with any of the following: Other medications that contain denosumab This medication may also interact with the following: Medications that lower your chance of fighting infection Steroid medications, such as prednisone or cortisone This  list may not describe all possible interactions. Give your health care provider a list of all the medicines, herbs, non-prescription drugs, or dietary supplements you use. Also tell them if you smoke, drink alcohol, or use illegal drugs. Some items may interact with your medicine. What should I watch for while using this medication? Your condition will be monitored carefully while you are receiving this medication. You may need blood work done while taking this medication. This medication may increase your risk of getting an infection. Call your care team for advice if you get a fever, chills, sore throat, or other symptoms of a cold or flu. Do not treat yourself. Try to avoid being around people who are sick. Tell your dentist and dental surgeon that you are taking this medication. You should not have major dental surgery while on this medication. See your dentist to have a dental exam and fix any dental problems before starting this medication. Take good care of your teeth while on this medication. Make sure you see your dentist for regular follow-up appointments. This medication may cause low levels of calcium in your body. The risk of severe side effects is increased in people with kidney disease. Your care team may prescribe calcium and vitamin D to help prevent low calcium levels while you take this medication. It is important to take calcium and vitamin D as directed by your care team. Talk to your care team if you may be pregnant. Serious birth defects may occur if you take this medication during pregnancy and for 5 months after the last dose. You will need a negative pregnancy test before starting this medication. Contraception  is recommended while taking this medication and for 5 months after the last dose. Your care team can help you find the option that works for you. Talk to your care team before breastfeeding. Changes to your treatment plan may be needed. What side effects may I notice from  receiving this medication? Side effects that you should report to your care team as soon as possible: Allergic reactions--skin rash, itching, hives, swelling of the face, lips, tongue, or throat Infection--fever, chills, cough, sore throat, wounds that don't heal, pain or trouble when passing urine, general feeling of discomfort or being unwell Low calcium level--muscle pain or cramps, confusion, tingling, or numbness in the hands or feet Osteonecrosis of the jaw--pain, swelling, or redness in the mouth, numbness of the jaw, poor healing after dental work, unusual discharge from the mouth, visible bones in the mouth Severe bone, joint, or muscle pain Skin infection--skin redness, swelling, warmth, or pain Side effects that usually do not require medical attention (report these to your care team if they continue or are bothersome): Back pain Headache Joint pain Muscle pain Pain in the hands, arms, legs, or feet Runny or stuffy nose Sore throat This list may not describe all possible side effects. Call your doctor for medical advice about side effects. You may report side effects to FDA at 1-800-FDA-1088. Where should I keep my medication? This medication is given in a hospital or clinic. It will not be stored at home. NOTE: This sheet is a summary. It may not cover all possible information. If you have questions about this medicine, talk to your doctor, pharmacist, or health care provider.  2024 Elsevier/Gold Standard (2022-11-18 00:00:00)   Zoledronic Acid Injection (Bone Disorders) What is this medication? ZOLEDRONIC ACID (ZOE le dron ik AS id) prevents and treats osteoporosis. It may also be used to treat Paget's disease of the bone. It works by Interior and spatial designer stronger and less likely to break (fracture). It belongs to a group of medications called bisphosphonates. This medicine may be used for other purposes; ask your health care provider or pharmacist if you have questions. COMMON  BRAND NAME(S): Reclast What should I tell my care team before I take this medication? They need to know if you have any of these conditions: Bleeding disorder Cancer Dental disease Kidney disease Low levels of calcium in the blood Low red blood cell counts Lung or breathing disease, such as asthma Receiving steroids, such as dexamethasone or prednisone An unusual or allergic reaction to zoledronic acid, other medications, foods, dyes, or preservatives Pregnant or trying to get pregnant Breast-feeding How should I use this medication? This medication is injected into a vein. It is given by your care team in a hospital or clinic setting. A special MedGuide will be given to you before each treatment. Be sure to read this information carefully each time. Talk to your care team about the use of this medication in children. Special care may be needed. Overdosage: If you think you have taken too much of this medicine contact a poison control center or emergency room at once. NOTE: This medicine is only for you. Do not share this medicine with others. What if I miss a dose? Keep appointments for follow-up doses. It is important not to miss your dose. Call your care team if you are unable to keep an appointment. What may interact with this medication? Certain antibiotics given by injection Medications for pain and inflammation, such as ibuprofen, naproxen, NSAIDs Some diuretics, such as bumetanide,  furosemide Teriparatide This list may not describe all possible interactions. Give your health care provider a list of all the medicines, herbs, non-prescription drugs, or dietary supplements you use. Also tell them if you smoke, drink alcohol, or use illegal drugs. Some items may interact with your medicine. What should I watch for while using this medication? Visit your care team for regular checks on your progress. It may be some time before you see the benefit from this medication. Some people who  take this medication have severe bone, joint, or muscle pain. This medication may also increase your risk for jaw problems or a broken thigh bone. Tell your care team right away if you have severe pain in your jaw, bones, joints, or muscles. Tell your care team if you have any pain that does not go away or that gets worse. You should make sure you get enough calcium and vitamin D while you are taking this medication. Discuss the foods you eat and the vitamins you take with your care team. You may need bloodwork while taking this medication. Tell your dentist and dental surgeon that you are taking this medication. You should not have major dental surgery while on this medication. See your dentist to have a dental exam and fix any dental problems before starting this medication. Take good care of your teeth while on this medication. Make sure you see your dentist for regular follow-up appointments. What side effects may I notice from receiving this medication? Side effects that you should report to your care team as soon as possible: Allergic reactions--skin rash, itching, hives, swelling of the face, lips, tongue, or throat Kidney injury--decrease in the amount of urine, swelling of the ankles, hands, or feet Low calcium level--muscle pain or cramps, confusion, tingling, or numbness in the hands or feet Osteonecrosis of the jaw--pain, swelling, or redness in the mouth, numbness of the jaw, poor healing after dental work, unusual discharge from the mouth, visible bones in the mouth Severe bone, joint, or muscle pain Side effects that usually do not require medical attention (report to your care team if they continue or are bothersome): Diarrhea Dizziness Headache Nausea Stomach pain Vomiting This list may not describe all possible side effects. Call your doctor for medical advice about side effects. You may report side effects to FDA at 1-800-FDA-1088. Where should I keep my medication? This  medication is given in a hospital or clinic. It will not be stored at home. NOTE: This sheet is a summary. It may not cover all possible information. If you have questions about this medicine, talk to your doctor, pharmacist, or health care provider.  2024 Elsevier/Gold Standard (2021-11-29 00:00:00)

## 2023-08-25 LAB — VITAMIN D 25 HYDROXY (VIT D DEFICIENCY, FRACTURES): Vit D, 25-Hydroxy: 53 ng/mL (ref 30–100)

## 2023-09-01 DIAGNOSIS — Z85828 Personal history of other malignant neoplasm of skin: Secondary | ICD-10-CM | POA: Diagnosis not present

## 2023-09-01 DIAGNOSIS — C44722 Squamous cell carcinoma of skin of right lower limb, including hip: Secondary | ICD-10-CM | POA: Diagnosis not present

## 2023-09-01 NOTE — Telephone Encounter (Signed)
Call to [facility] for benefits of Reclast. Spoke with Nine, Engineer, manufacturing, who states PA required for reclast, 20% coinsurance and $20 copay for administration. Call reference #: [id number].  Authorization department #: [phone]) [phone], option 3.

## 2023-09-03 NOTE — Telephone Encounter (Signed)
PA for Reclast signed by Dr. Edward Jolly and faxed to [facility] pre certification department at [phone]. Will await response from [employer].

## 2023-09-03 NOTE — Telephone Encounter (Signed)
PA form for reclast filled out and taken to Dr. Rica Records desk to review and sign.

## 2023-09-03 NOTE — Telephone Encounter (Signed)
Form signed and returned to you

## 2023-09-08 ENCOUNTER — Ambulatory Visit (INDEPENDENT_AMBULATORY_CARE_PROVIDER_SITE_OTHER): Payer: Medicare HMO | Admitting: *Deleted

## 2023-09-08 VITALS — BP 135/79 | HR 98 | Ht 63.0 in | Wt 156.4 lb

## 2023-09-08 DIAGNOSIS — Z23 Encounter for immunization: Secondary | ICD-10-CM | POA: Diagnosis not present

## 2023-09-08 DIAGNOSIS — Z0000 Encounter for general adult medical examination without abnormal findings: Secondary | ICD-10-CM

## 2023-09-08 NOTE — Progress Notes (Signed)
Subjective:   Angela Clay is a 89 y.o. female who presents for Medicare Annual (Subsequent) preventive examination.  Visit Complete: In person  Cardiac Risk Factors include: advanced age (>53men, >84 women);dyslipidemia;hypertension     Objective:    Today's Vitals   09/08/23 1304  BP: 135/79  Pulse: 98  Weight: 156 lb 6.4 oz (70.9 kg)  Height: 5\' 3"  (1.6 m)   Body mass index is 27.71 kg/m.     09/08/2023    1:13 PM 09/05/2022    1:01 PM 09/02/2021    1:48 PM 06/28/2020    1:11 PM 01/10/2020    6:41 AM 11/23/2019    6:43 AM 07/21/2019   10:34 AM  Advanced Directives  Does Patient Have a Medical Advance Directive? Yes Yes Yes Yes Yes Yes Yes  Type of Estate agent of [place];Living will Healthcare Power of [place];Living will Healthcare Power of [place];Living will Living will Living will Living will Healthcare Power of [place];Living will  Does patient want to make changes to medical advance directive? No - Patient declined No - Patient declined  No - Patient declined No - Patient declined  No - Patient declined  Copy of Healthcare Power of Attorney in Chart? Yes - validated most recent copy scanned in chart (See row information) Yes - validated most recent copy scanned in chart (See row information) Yes - validated most recent copy scanned in chart (See row information)    No - copy requested    Current Medications (verified) Outpatient Encounter Medications as of 09/08/2023  Medication Sig   ACETAMINOPHEN PO Take 650 mg by mouth every 6 (six) hours as needed for mild pain. Tylenol Arthritis   aspirin 81 MG tablet Take 81 mg by mouth at bedtime.   atorvastatin (LIPITOR) 40 MG tablet TAKE 1 TABLET BY MOUTH AT BEDTIME   Calcium Carb-Cholecalciferol (CALCIUM 600 + D PO) Take 1 tablet by mouth daily.   Cholecalciferol (VITAMIN D3) 50 MCG (2000 UT) CAPS Take 2,000 Units by mouth daily.   hydrochlorothiazide (HYDRODIURIL) 25 MG tablet Take 1 tablet  (25 mg total) by mouth daily.   levothyroxine (SYNTHROID) 125 MCG tablet Take 1 tablet (125 mcg total) by mouth daily before breakfast.   Menthol, Topical Analgesic, (BIOFREEZE) 4 % GEL Apply topically.   Multiple Vitamins-Minerals (CENTRUM SILVER 50+WOMEN) TABS Take 1 tablet by mouth daily.   mupirocin ointment (BACTROBAN) 2 % SMARTSIG:1 Application Topical 1-2 Times Daily   omeprazole (PRILOSEC) 20 MG capsule Take 1 capsule (20 mg total) by mouth daily as needed.   Polyethyl Glycol-Propyl Glycol (SYSTANE ULTRA OP) Place 1 drop into both eyes daily as needed (Dry eyes).   Tart Cherry 1200 MG CAPS Take 1 capsule by mouth daily.   No facility-administered encounter medications on file as of 09/08/2023.    Allergies (verified) Codeine   History: Past Medical History:  Diagnosis Date   Actinic keratosis 05/2015   Right mid central dorsal hand   Autoimmune hepatitis (HCC)    Autoimmune hepatitis (HCC)    Cataract 04/2018   surgery delayed/now completed   Gallstones    GERD (gastroesophageal reflux disease)    EGD for dysphagia 2000 aprox (-)   History of radiation therapy    Hyperlipidemia    Hypertension    Hypothyroidism    Lymphoma (HCC)    neck,mlig,unspecified site dx 1970s   OA (osteoarthritis)    severe at neck   Osteopenia 07/20/2023   FRAX - increased risk  of hip fracture   Pancreatic cyst    Pancreatic cyst    SCC (squamous cell carcinoma)    Syncope 11/2012   admited . w/u (-)   Thyroid cancer [facility])    Past Surgical History:  Procedure Laterality Date   APPENDECTOMY     BELPHAROPTOSIS REPAIR Bilateral 2017   BIOPSY  11/23/2019   Procedure: BIOPSY;  Surgeon: Lemar Lofty., MD;  Location: [facility];  Service: Gastroenterology;;   CATARACT EXTRACTION, BILATERAL Bilateral 01/10/2019   Dr.Rankin   CHOLECYSTECTOMY  02/24/2006   ESOPHAGOGASTRODUODENOSCOPY (EGD) WITH PROPOFOL N/A 11/23/2019   Procedure: ESOPHAGOGASTRODUODENOSCOPY (EGD) WITH PROPOFOL;   Surgeon: Lemar Lofty., MD;  Location: [facility];  Service: Gastroenterology;  Laterality: N/A;   EXPLORATORY LAPAROTOMY     for lymphadenomopathy; appendix removed at this time   LEFT HEART CATH AND CORONARY ANGIOGRAPHY N/A 07/20/2018   Procedure: LEFT HEART CATH AND CORONARY ANGIOGRAPHY;  Surgeon: Lyn Records, MD;  Location: [facility];  Service: Cardiovascular;  Laterality: N/A;   LIVER BIOPSY     THYROIDECTOMY     TONSILLECTOMY AND ADENOIDECTOMY  1949   TUBAL LIGATION  1972   UPPER ESOPHAGEAL ENDOSCOPIC ULTRASOUND (EUS) N/A 11/23/2019   Procedure: UPPER ESOPHAGEAL ENDOSCOPIC ULTRASOUND (EUS);  Surgeon: Lemar Lofty., MD;  Location: [facility];  Service: Gastroenterology;  Laterality: N/A;  WITH BIOPSY   NEEDS 90 MINUTES   vitamacular hole surgery Right 2020   eye   Family History  Problem Relation Age of Onset   Stroke Mother 25   Arthritis Mother    Coronary artery disease Father        MI age 59   Early death Father    Heart disease Father    Deep vein thrombosis Brother        PE at age 50   Alcohol abuse Brother    Uterine cancer Sister    Heart failure Sister    Diabetes Neg Hx    Colon cancer Neg Hx    Breast cancer Neg Hx    Inflammatory bowel disease Neg Hx    Liver disease Neg Hx    Pancreatic cancer Neg Hx    Rectal cancer Neg Hx    Stomach cancer Neg Hx    Social History   Socioeconomic History   Marital status: Widowed    Spouse name: Greggory Stallion   Number of children: 3   Years of education: Not on file   Highest education level: 12th grade  Occupational History   Occupation: retired  Tobacco Use   Smoking status: Never    Passive exposure: Past   Smokeless tobacco: Never  Vaping Use   Vaping status: Never Used  Substance and Sexual Activity   Alcohol use: No    Alcohol/week: 0.0 standard drinks of alcohol   Drug use: Never   Sexual activity: Not Currently    Partners: Male    Birth control/protection:  Post-menopausal    Comment: sexual debut over 18yo, Less than 5 lifetime partners, no hx STI, no hx DES exposure  Other Topics Concern   Not on file  Social History Narrative   3 kids, 8 GK   Lost a g-child to suicide 2017   Lost her husband 01/23/2022   Social Determinants of Health   Financial Resource Strain: Low Risk  (09/08/2023)   Overall Financial Resource Strain (CARDIA)    Difficulty of Paying Living Expenses: Not hard at all  Food Insecurity: No Food  Insecurity (09/08/2023)   Hunger Vital Sign    Worried About Running Out of Food in the Last Year: Never true    Ran Out of Food in the Last Year: Never true  Transportation Needs: No Transportation Needs (09/08/2023)   PRAPARE - Administrator, Civil Service (Medical): No    Lack of Transportation (Non-Medical): No  Physical Activity: Inactive (09/08/2023)   Exercise Vital Sign    Days of Exercise per Week: 0 days    Minutes of Exercise per Session: 0 min  Stress: No Stress Concern Present (09/08/2023)   [employer] of Occupational Health - Occupational Stress Questionnaire    Feeling of Stress : Not at all  Social Connections: Moderately Isolated (09/08/2023)   Social Connection and Isolation Panel [NHANES]    Frequency of Communication with Friends and Family: More than three times a week    Frequency of Social Gatherings with Friends and Family: More than three times a week    Attends Religious Services: More than 4 times per year    Active Member of [employer] or Organizations: No    Attends Banker Meetings: Never    Marital Status: Widowed    Tobacco Counseling Counseling given: Not Answered   Clinical Intake:  Pre-visit preparation completed: Yes  Pain : No/denies pain  BMI - recorded: 27.71 Nutritional Status: BMI 25 -29 Overweight Nutritional Risks: None Diabetes: No  How often do you need to have someone help you when you read instructions, pamphlets, or other written  materials from your doctor or pharmacy?: 1 - Never  Interpreter Needed?: No  Information entered by :: Donne Anon, CMA   Activities of Daily Living    09/08/2023    1:05 PM  In your present state of health, do you have any difficulty performing the following activities:  Hearing? 0  Vision? 0  Difficulty concentrating or making decisions? 1  Comment remembering  Walking or climbing stairs? 1  Comment left knee pain  Dressing or bathing? 0  Doing errands, shopping? 0  Preparing Food and eating ? N  Using the Toilet? N  In the past six months, have you accidently leaked urine? N  Do you have problems with loss of bowel control? N  Managing your Medications? N  Managing your Finances? N  Housekeeping or managing your Housekeeping? N    Patient Care Team: Wanda Plump, MD as PCP - Jerelene Redden, MD as PCP - Cardiology (Cardiology) Osborn Coho, MD (Inactive) as Consulting Physician (Otolaryngology) [place], Amy, MD as Consulting Physician (Dermatology) Antony Contras, MD as Consulting Physician (Ophthalmology) Henrene Pastor, RPH-CPP (Pharmacist)  Indicate any recent Medical Services you may have received from other than [facility] providers in the past year (date may be approximate).     Assessment:   This is a routine wellness examination for Angela Clay.  Hearing/Vision screen No results found.   Goals Addressed   None    Depression Screen    09/08/2023    1:18 PM 05/05/2023   10:10 AM 11/04/2022   10:24 AM 09/05/2022    1:04 PM 09/02/2021    1:52 PM 11/22/2020   11:03 AM 06/28/2020    1:18 PM  PHQ 2/9 Scores  PHQ - 2 Score 0 0 0 0 1 0 0    Fall Risk    09/08/2023    1:11 PM 05/05/2023   10:10 AM 11/04/2022   10:24 AM 09/05/2022    1:04 PM 09/02/2021  1:50 PM  Fall Risk   Falls in the past year? 1 0 0 0 0  Comment tripped over step      Number falls in past yr: 0 0 0 0 0  Injury with Fall? 0 0 0 0 0  Risk for fall due to : No Fall Risks   No Fall Risks    Follow up Falls evaluation completed Falls evaluation completed Falls evaluation completed Falls evaluation completed Falls prevention discussed    MEDICARE RISK AT HOME: Medicare Risk at Home Any stairs in or around the home?: No If so, are there any without handrails?: No Home free of loose throw rugs in walkways, pet beds, electrical cords, etc?: Yes Adequate lighting in your home to reduce risk of falls?: Yes Life alert?: No Use of a cane, walker or w/c?: No Grab bars in the bathroom?: No Shower chair or bench in shower?: No Elevated toilet seat or a handicapped toilet?: Yes (comfort height)  TIMED UP AND GO:  Was the test performed?  Yes  Length of time to ambulate 10 feet: 7 sec Gait slow and steady without use of assistive device    Cognitive Function:    05/11/2017    1:54 PM  MMSE - Mini Mental State Exam  Orientation to time 5  Orientation to Place 5  Registration 3  Attention/ Calculation 5  Recall 2  Language- name 2 objects 2  Language- repeat 1  Language- follow 3 step command 2  Language- read & follow direction 1  Write a sentence 1  Copy design 1  Total score 28        09/08/2023    1:38 PM 09/05/2022    1:15 PM  6CIT Screen  What Year? 0 points 0 points  What month? 0 points 0 points  What time? 0 points 0 points  Count back from 20 0 points 0 points  Months in reverse 0 points 0 points  Repeat phrase 2 points 2 points  Total Score 2 points 2 points    Immunizations Immunization History  Administered Date(s) Administered   Fluad Trivalent(High Dose 65+) 09/08/2023   Influenza Split 10/01/2011   Influenza Whole 08/25/2007, 09/08/2008, 08/03/2009, 08/20/2010   Influenza, High Dose Seasonal PF 07/03/2015, 07/07/2016, 07/16/2017, 07/21/2018, 08/04/2019, 08/05/2022   Influenza, Seasonal, Injecte, Preservative Fre 10/11/2012   Influenza,inj,Quad PF,6+ Mos 10/18/2013, 09/19/2014   Influenza-Unspecified 08/27/2020, 07/05/2021   Moderna  Covid-19 Vaccine Bivalent Booster 64yrs & up 09/30/2021   Moderna SARS-COV2 Booster Vaccination 08/27/2020, 03/27/2021   Moderna Sars-Covid-2 Vaccination 11/08/2019, 12/05/2019   Pneumococcal Conjugate-13 01/02/2014   Pneumococcal Polysaccharide-23 10/27/2000, 08/25/2007, 01/15/2018   Td 10/27/1997, 08/30/2008   Tdap 08/04/2019   Zoster, Live 03/04/2013    TDAP status: Up to date  Flu Vaccine status: Completed at today's visit  Pneumococcal vaccine status: Up to date  Covid-19 vaccine status: Information provided on how to obtain vaccines.   Qualifies for Shingles Vaccine? Yes   Zostavax completed Yes   Shingrix Completed?: No.    Education has been provided regarding the importance of this vaccine. Patient has been advised to call insurance company to determine out of pocket expense if they have not yet received this vaccine. Advised may also receive vaccine at local pharmacy or Health Dept. Verbalized acceptance and understanding.  Screening Tests Health Maintenance  Topic Date Due   Zoster Vaccines- Shingrix (1 of 2) 11/26/1952   FOOT EXAM  07/02/2023   Medicare Annual Wellness (AWV)  09/06/2023  HEMOGLOBIN A1C  11/05/2023   OPHTHALMOLOGY EXAM  08/02/2024   DTaP/Tdap/Td (4 - Td or Tdap) 08/03/2029   Pneumonia Vaccine 66+ Years old  Completed   INFLUENZA VACCINE  Completed   DEXA SCAN  Completed   HPV VACCINES  Aged Out   COVID-19 Vaccine  Discontinued    Health Maintenance  Health Maintenance Due  Topic Date Due   Zoster Vaccines- Shingrix (1 of 2) 11/26/1952   FOOT EXAM  07/02/2023   Medicare Annual Wellness (AWV)  09/06/2023    Colorectal cancer screening: No longer required.   Mammogram status: Completed 01/05/23. Repeat every year  Bone Density status: Ordered 06/16/23 by GYN. Pt provided with contact info and advised to call to schedule appt.  Lung Cancer Screening: (Low Dose CT Chest recommended if Age 27-80 years, 20 pack-year currently smoking OR have  quit w/in 15years.) does not qualify.   Additional Screening:  Hepatitis C Screening: does not qualify  Vision Screening: Recommended annual ophthalmology exams for early detection of glaucoma and other disorders of the eye. Is the patient up to date with their annual eye exam?  Yes  Who is the provider or what is the name of the office in which the patient attends annual eye exams? Dr. Antony Contras If pt is not established with a provider, would they like to be referred to a provider to establish care? No .   Dental Screening: Recommended annual dental exams for proper oral hygiene  Diabetic Foot Exam: N/a  Community Resource Referral / Chronic Care Management: CRR required this visit?  No   CCM required this visit?  No     Plan:     I have personally reviewed and noted the following in the patient's chart:   Medical and social history Use of alcohol, tobacco or illicit drugs  Current medications and supplements including opioid prescriptions. Patient is not currently taking opioid prescriptions. Functional ability and status Nutritional status Physical activity Advanced directives List of other physicians Hospitalizations, surgeries, and ER visits in previous 12 months Vitals Screenings to include cognitive, depression, and falls Referrals and appointments  In addition, I have reviewed and discussed with patient certain preventive protocols, quality metrics, and best practice recommendations. A written personalized care plan for preventive services as well as general preventive health recommendations were provided to patient.     Donne Anon, CMA   09/08/2023   After Visit Summary: (In Person-Declined) Patient declined AVS at this time.  Nurse Notes: None

## 2023-09-08 NOTE — Patient Instructions (Signed)
Angela Clay , Thank you for taking time to come for your Medicare Wellness Visit. I appreciate your ongoing commitment to your health goals. Please review the following plan we discussed and let me know if I can assist you in the future.     This is a list of the screening recommended for you and due dates:  Health Maintenance  Topic Date Due   Zoster (Shingles) Vaccine (1 of 2) 11/26/1952   Complete foot exam   07/02/2023   Hemoglobin A1C  11/05/2023   Eye exam for diabetics  08/02/2024   Medicare Annual Wellness Visit  09/07/2024   DTaP/Tdap/Td vaccine (4 - Td or Tdap) 08/03/2029   Pneumonia Vaccine  Completed   Flu Shot  Completed   DEXA scan (bone density measurement)  Completed   HPV Vaccine  Aged Out   COVID-19 Vaccine  Discontinued    Next appointment: Follow up in one year for your annual wellness visit    Preventive Care 65 Years and Older, Female Preventive care refers to lifestyle choices and visits with your health care provider that can promote health and wellness. What does preventive care include? A yearly physical exam. This is also called an annual well check. Dental exams once or twice a year. Routine eye exams. Ask your health care provider how often you should have your eyes checked. Personal lifestyle choices, including: Daily care of your teeth and gums. Regular physical activity. Eating a healthy diet. Avoiding tobacco and drug use. Limiting alcohol use. Practicing safe sex. Taking low-dose aspirin every day. Taking vitamin and mineral supplements as recommended by your health care provider. What happens during an annual well check? The services and screenings done by your health care provider during your annual well check will depend on your age, overall health, lifestyle risk factors, and family history of disease. Counseling  Your health care provider may ask you questions about your: Alcohol use. Tobacco use. Drug use. Emotional  well-being. Home and relationship well-being. Sexual activity. Eating habits. History of falls. Memory and ability to understand (cognition). Work and work Astronomer. Reproductive health. Screening  You may have the following tests or measurements: Height, weight, and BMI. Blood pressure. Lipid and cholesterol levels. These may be checked every 5 years, or more frequently if you are over 80 years old. Skin check. Lung cancer screening. You may have this screening every year starting at age 87 if you have a 30-pack-year history of smoking and currently smoke or have quit within the past 15 years. Fecal occult blood test (FOBT) of the stool. You may have this test every year starting at age 87. Flexible sigmoidoscopy or colonoscopy. You may have a sigmoidoscopy every 5 years or a colonoscopy every 10 years starting at age 20. Hepatitis C blood test. Hepatitis B blood test. Sexually transmitted disease (STD) testing. Diabetes screening. This is done by checking your blood sugar (glucose) after you have not eaten for a while (fasting). You may have this done every 1-3 years. Bone density scan. This is done to screen for osteoporosis. You may have this done starting at age 87. Mammogram. This may be done every 1-2 years. Talk to your health care provider about how often you should have regular mammograms. Talk with your health care provider about your test results, treatment options, and if necessary, the need for more tests. Vaccines  Your health care provider may recommend certain vaccines, such as: Influenza vaccine. This is recommended every year. Tetanus, diphtheria, and acellular pertussis (  Tdap, Td) vaccine. You may need a Td booster every 10 years. Zoster vaccine. You may need this after age 87. Pneumococcal 13-valent conjugate (PCV13) vaccine. One dose is recommended after age 87. Pneumococcal polysaccharide (PPSV23) vaccine. One dose is recommended after age 87. Talk to your  health care provider about which screenings and vaccines you need and how often you need them. This information is not intended to replace advice given to you by your health care provider. Make sure you discuss any questions you have with your health care provider. Document Released: 11/09/2015 Document Revised: 07/02/2016 Document Reviewed: 08/14/2015 Elsevier Interactive Patient Education  2017 [employer].  Fall Prevention in the Home Falls can cause injuries. They can happen to people of all ages. There are many things you can do to make your home safe and to help prevent falls. What can I do on the outside of my home? Regularly fix the edges of walkways and driveways and fix any cracks. Remove anything that might make you trip as you walk through a door, such as a raised step or threshold. Trim any bushes or trees on the path to your home. Use bright outdoor lighting. Clear any walking paths of anything that might make someone trip, such as rocks or tools. Regularly check to see if handrails are loose or broken. Make sure that both sides of any steps have handrails. Any raised decks and porches should have guardrails on the edges. Have any leaves, snow, or ice cleared regularly. Use sand or salt on walking paths during winter. Clean up any spills in your garage right away. This includes oil or grease spills. What can I do in the bathroom? Use night lights. Install grab bars by the toilet and in the tub and shower. Do not use towel bars as grab bars. Use non-skid mats or decals in the tub or shower. If you need to sit down in the shower, use a plastic, non-slip stool. Keep the floor dry. Clean up any water that spills on the floor as soon as it happens. Remove soap buildup in the tub or shower regularly. Attach bath mats securely with double-sided non-slip rug tape. Do not have throw rugs and other things on the floor that can make you trip. What can I do in the bedroom? Use night  lights. Make sure that you have a light by your bed that is easy to reach. Do not use any sheets or blankets that are too big for your bed. They should not hang down onto the floor. Have a firm chair that has side arms. You can use this for support while you get dressed. Do not have throw rugs and other things on the floor that can make you trip. What can I do in the kitchen? Clean up any spills right away. Avoid walking on wet floors. Keep items that you use a lot in easy-to-reach places. If you need to reach something above you, use a strong step stool that has a grab bar. Keep electrical cords out of the way. Do not use floor polish or wax that makes floors slippery. If you must use wax, use non-skid floor wax. Do not have throw rugs and other things on the floor that can make you trip. What can I do with my stairs? Do not leave any items on the stairs. Make sure that there are handrails on both sides of the stairs and use them. Fix handrails that are broken or loose. Make sure that handrails are  as long as the stairways. Check any carpeting to make sure that it is firmly attached to the stairs. Fix any carpet that is loose or worn. Avoid having throw rugs at the top or bottom of the stairs. If you do have throw rugs, attach them to the floor with carpet tape. Make sure that you have a light switch at the top of the stairs and the bottom of the stairs. If you do not have them, ask someone to add them for you. What else can I do to help prevent falls? Wear shoes that: Do not have high heels. Have rubber bottoms. Are comfortable and fit you well. Are closed at the toe. Do not wear sandals. If you use a stepladder: Make sure that it is fully opened. Do not climb a closed stepladder. Make sure that both sides of the stepladder are locked into place. Ask someone to hold it for you, if possible. Clearly mark and make sure that you can see: Any grab bars or handrails. First and last  steps. Where the edge of each step is. Use tools that help you move around (mobility aids) if they are needed. These include: Canes. Walkers. Scooters. Crutches. Turn on the lights when you go into a dark area. Replace any light bulbs as soon as they burn out. Set up your furniture so you have a clear path. Avoid moving your furniture around. If any of your floors are uneven, fix them. If there are any pets around you, be aware of where they are. Review your medicines with your doctor. Some medicines can make you feel dizzy. This can increase your chance of falling. Ask your doctor what other things that you can do to help prevent falls. This information is not intended to replace advice given to you by your health care provider. Make sure you discuss any questions you have with your health care provider. Document Released: 08/09/2009 Document Revised: 03/20/2016 Document Reviewed: 11/17/2014 Elsevier Interactive Patient Education  2017 [employer].

## 2023-10-01 NOTE — Telephone Encounter (Signed)
PA for reclast approved from 09-03-23 to 09-02-24. Case #: [id number].   Call to patient. Patient states she does not want to proceed with reclast infusion. Has read about the side effects and is fearful. Declines to discuss any other treatment options for osetopenia and increased risk of hip fracture at this time. RN advised would update Dr. Edward Jolly. Patient agreeable.   Routing to provider as Lorain Childes.

## 2023-10-27 ENCOUNTER — Ambulatory Visit (INDEPENDENT_AMBULATORY_CARE_PROVIDER_SITE_OTHER): Payer: Medicare HMO | Admitting: Family Medicine

## 2023-10-27 ENCOUNTER — Encounter: Payer: Self-pay | Admitting: Family Medicine

## 2023-10-27 VITALS — BP 120/80 | HR 83 | Temp 97.7°F | Resp 18 | Ht 63.0 in | Wt 154.8 lb

## 2023-10-27 DIAGNOSIS — J40 Bronchitis, not specified as acute or chronic: Secondary | ICD-10-CM

## 2023-10-27 MED ORDER — AZITHROMYCIN 250 MG PO TABS
ORAL_TABLET | ORAL | 0 refills | Status: DC
Start: 2023-10-27 — End: 2023-11-17

## 2023-10-27 MED ORDER — PREDNISONE 10 MG PO TABS
ORAL_TABLET | ORAL | 0 refills | Status: DC
Start: 2023-10-27 — End: 2023-11-17

## 2023-10-27 NOTE — Progress Notes (Signed)
 Established Patient Office Visit  Subjective   Patient ID: Angela Clay, female    DOB: 01-Feb-1934  Age: 89 y.o. MRN: [id number]  Chief Complaint  Patient presents with   Cough    Pt states sxs started last Tuesday. Pt states having productive cough, congestion. No sore throat, chills or fatigue. Pt states taking OTC Mucinex. No COVID test    HPI Discussed the use of AI scribe software for clinical note transcription with the patient, who gave verbal consent to proceed.  History of Present Illness   The patient, with an unspecified medical history, presented with a week-long history of a cold that started last Tuesday. The cold gradually worsened over the week, with the onset of a deep cough around Sunday. The cough is not constant, but when present, it is deep. The patient has been expectorating some sputum, which is mostly clear but sometimes has a slight yellow tinge. No green sputum has been noted. The patient denies any associated fever, body aches, or sore throat. The patient also reports a runny nose, which has improved since the onset of the cold.  The patient has been self-managing the symptoms with over-the-counter Mucinex and Mucinex DM, but is unsure of her effectiveness. The patient also uses over-the-counter Tigan for cough relief at night, which seems to help. The patient has no known history of requiring inhalers and has only had bronchitis once in the past twenty years. The patient denies any recent exposure to sick individuals.      Patient Active Problem List   Diagnosis Date Noted   SCC (squamous cell carcinoma) 07/07/2023   Autoimmune hepatitis (HCC) 04/01/2020   Candida esophagitis (HCC) 12/31/2019   Chronic gastritis without bleeding 12/31/2019   Elevated alkaline phosphatase level 10/14/2019   Pancreatic cyst 10/14/2019   Dilated bile duct 10/14/2019   Positive antismooth muscle antibody  08/08/2019   Abnormal ultrasound of liver 07/07/2019   Abnormal LFTs  07/05/2019   Abnormal SPEP 06/22/2019   Pericarditis 08/09/2018   ACS (acute coronary syndrome) (HCC) 07/19/2018   Shingles 12/04/2015   Follow-up-----------------PCP notes  07/03/2015   Prolapse of female pelvic organs 03/11/2013   Dystrophy, vulva-- s/p bx, on clobetasol prn 03/11/2013   Monoarthritis of hand 03/10/2013   Diabetes mellitus without complication (HCC) 12/07/2012   Annual physical exam 02/19/2011   Carotid arterial disease (HCC) 02/19/2011   Osteoarthritis 04/26/2010   Hypothyroidism 01/11/2008   LYMPHOMA NEC, MLIG, UNSPECIFIED SITE 08/25/2007   Hyperlipidemia 08/25/2007   Essential hypertension 08/25/2007   Gastroesophageal reflux disease 08/25/2007   Osteopenia 08/25/2007   THYROIDECTOMY, HX OF 08/25/2007   Past Medical History:  Diagnosis Date   Actinic keratosis 05/2015   Right mid central dorsal hand   Autoimmune hepatitis (HCC)    Autoimmune hepatitis (HCC)    Cataract 04/2018   surgery delayed/now completed   Gallstones    GERD (gastroesophageal reflux disease)    EGD for dysphagia 2000 aprox (-)   History of radiation therapy    Hyperlipidemia    Hypertension    Hypothyroidism    Lymphoma (HCC)    neck,mlig,unspecified site dx 1970s   OA (osteoarthritis)    severe at neck   Osteopenia 07/20/2023   FRAX - increased risk of hip fracture   Pancreatic cyst    Pancreatic cyst    SCC (squamous cell carcinoma)    Syncope 11/2012   admited . w/u (-)   Thyroid  cancer [facility])    Past Surgical History:  Procedure Laterality Date   APPENDECTOMY     BELPHAROPTOSIS REPAIR Bilateral 2017   BIOPSY  11/23/2019   Procedure: BIOPSY;  Surgeon: Wilhelmenia Aloha Raddle., MD;  Location: [facility];  Service: Gastroenterology;;   CATARACT EXTRACTION, BILATERAL Bilateral 01/10/2019   Dr.Rankin   CHOLECYSTECTOMY  02/24/2006   ESOPHAGOGASTRODUODENOSCOPY (EGD) WITH PROPOFOL  N/A 11/23/2019   Procedure: ESOPHAGOGASTRODUODENOSCOPY (EGD) WITH PROPOFOL ;  Surgeon:  Wilhelmenia Aloha Raddle., MD;  Location: [facility];  Service: Gastroenterology;  Laterality: N/A;   EXPLORATORY LAPAROTOMY     for lymphadenomopathy; appendix removed at this time   LEFT HEART CATH AND CORONARY ANGIOGRAPHY N/A 07/20/2018   Procedure: LEFT HEART CATH AND CORONARY ANGIOGRAPHY;  Surgeon: Claudene Victory ORN, MD;  Location: [facility];  Service: Cardiovascular;  Laterality: N/A;   LIVER BIOPSY     THYROIDECTOMY     TONSILLECTOMY AND ADENOIDECTOMY  1949   TUBAL LIGATION  1972   UPPER ESOPHAGEAL ENDOSCOPIC ULTRASOUND (EUS) N/A 11/23/2019   Procedure: UPPER ESOPHAGEAL ENDOSCOPIC ULTRASOUND (EUS);  Surgeon: Wilhelmenia Aloha Raddle., MD;  Location: [facility];  Service: Gastroenterology;  Laterality: N/A;  WITH BIOPSY   NEEDS 90 MINUTES   vitamacular hole surgery Right 2020   eye   Social History   Tobacco Use   Smoking status: Never    Passive exposure: Past   Smokeless tobacco: Never  Vaping Use   Vaping status: Never Used  Substance Use Topics   Alcohol use: No    Alcohol/week: 0.0 standard drinks of alcohol   Drug use: Never   Social History   Socioeconomic History   Marital status: Widowed    Spouse name: Zachary   Number of children: 3   Years of education: Not on file   Highest education level: 12th grade  Occupational History   Occupation: retired  Tobacco Use   Smoking status: Never    Passive exposure: Past   Smokeless tobacco: Never  Vaping Use   Vaping status: Never Used  Substance and Sexual Activity   Alcohol use: No    Alcohol/week: 0.0 standard drinks of alcohol   Drug use: Never   Sexual activity: Not Currently    Partners: Male    Birth control/protection: Post-menopausal    Comment: sexual debut over 18yo, Less than 5 lifetime partners, no hx STI, no hx DES exposure  Other Topics Concern   Not on file  Social History Narrative   3 kids, 8 GK   Lost a g-child to suicide 2017   Lost her husband 01/23/2022   Social Drivers of  Health   Financial Resource Strain: Low Risk  (09/08/2023)   Overall Financial Resource Strain (CARDIA)    Difficulty of Paying Living Expenses: Not hard at all  Food Insecurity: No Food Insecurity (09/08/2023)   Hunger Vital Sign    Worried About Running Out of Food in the Last Year: Never true    Ran Out of Food in the Last Year: Never true  Transportation Needs: No Transportation Needs (09/08/2023)   PRAPARE - Administrator, Civil Service (Medical): No    Lack of Transportation (Non-Medical): No  Physical Activity: Inactive (09/08/2023)   Exercise Vital Sign    Days of Exercise per Week: 0 days    Minutes of Exercise per Session: 0 min  Stress: No Stress Concern Present (09/08/2023)   [employer] of Occupational Health - Occupational Stress Questionnaire    Feeling of Stress : Not at all  Social Connections: Moderately  Isolated (09/08/2023)   Social Connection and Isolation Panel [NHANES]    Frequency of Communication with Friends and Family: More than three times a week    Frequency of Social Gatherings with Friends and Family: More than three times a week    Attends Religious Services: More than 4 times per year    Active Member of [employer] or Organizations: No    Attends Banker Meetings: Never    Marital Status: Widowed  Intimate Partner Violence: Not At Risk (09/08/2023)   Humiliation, Afraid, Rape, and Kick questionnaire    Fear of Current or Ex-Partner: No    Emotionally Abused: No    Physically Abused: No    Sexually Abused: No   Family Status  Relation Name Status   Mother Kenneth VEAR Glatter Deceased   Father Lynwood KATHEE Glatter Deceased   Brother Leigh Glatter Deceased   Sister  Alive   Sister  Deceased   Neg Hx  (Not Specified)  No partnership data on file   Family History  Problem Relation Age of Onset   Stroke Mother 34   Arthritis Mother    Coronary artery disease Father        MI age 7   Early death Father    Heart disease  Father    Deep vein thrombosis Brother        PE at age 73   Alcohol abuse Brother    Uterine cancer Sister    Heart failure Sister    Diabetes Neg Hx    Colon cancer Neg Hx    Breast cancer Neg Hx    Inflammatory bowel disease Neg Hx    Liver disease Neg Hx    Pancreatic cancer Neg Hx    Rectal cancer Neg Hx    Stomach cancer Neg Hx    Allergies  Allergen Reactions   Codeine Nausea And Vomiting      Review of Systems  Constitutional:  Negative for fever and malaise/fatigue.  HENT:  Negative for congestion.   Eyes:  Negative for blurred vision.  Respiratory:  Positive for sputum production and wheezing. Negative for shortness of breath.   Cardiovascular:  Negative for chest pain, palpitations and leg swelling.  Gastrointestinal:  Negative for abdominal pain, blood in stool and nausea.  Genitourinary:  Negative for dysuria and frequency.  Musculoskeletal:  Negative for falls.  Skin:  Negative for rash.  Neurological:  Negative for dizziness, loss of consciousness and headaches.  Endo/Heme/Allergies:  Negative for environmental allergies.  Psychiatric/Behavioral:  Negative for depression. The patient is not nervous/anxious.       Objective:     BP 120/80 (BP Location: Left Arm, Patient Position: Sitting, Cuff Size: Normal)   Pulse 83   Temp 97.7 F (36.5 C) (Oral)   Resp 18   Ht 5' 3 (1.6 m)   Wt 154 lb 12.8 oz (70.2 kg)   SpO2 94%   BMI 27.42 kg/m  BP Readings from Last 3 Encounters:  10/27/23 120/80  09/08/23 135/79  08/24/23 128/82   Wt Readings from Last 3 Encounters:  10/27/23 154 lb 12.8 oz (70.2 kg)  09/08/23 156 lb 6.4 oz (70.9 kg)  08/24/23 157 lb (71.2 kg)   SpO2 Readings from Last 3 Encounters:  10/27/23 94%  05/05/23 95%  11/04/22 96%      Physical Exam Vitals and nursing note reviewed.  Constitutional:      General: She is not in acute distress.    Appearance:  Normal appearance. She is well-developed.  HENT:     Head: Normocephalic  and atraumatic.  Eyes:     General: No scleral icterus.       Right eye: No discharge.        Left eye: No discharge.  Cardiovascular:     Rate and Rhythm: Normal rate and regular rhythm.     Heart sounds: No murmur heard. Pulmonary:     Effort: No respiratory distress.     Breath sounds: Wheezing present. No rales.  Musculoskeletal:        General: Normal range of motion.     Cervical back: Normal range of motion and neck supple.     Right lower leg: No edema.     Left lower leg: No edema.  Skin:    General: Skin is warm and dry.  Neurological:     Mental Status: She is alert and oriented to person, place, and time.  Psychiatric:        Mood and Affect: Mood normal.        Behavior: Behavior normal.        Thought Content: Thought content normal.        Judgment: Judgment normal.      No results found for any visits on 10/27/23.  Last CBC Lab Results  Component Value Date   WBC 6.1 05/05/2023   HGB 13.0 05/05/2023   HCT 39.5 05/05/2023   MCV 90.5 05/05/2023   MCH 28.4 01/10/2020   RDW 14.7 05/05/2023   PLT 215.0 05/05/2023   Last metabolic panel Lab Results  Component Value Date   GLUCOSE 112 (H) 05/05/2023   NA 139 05/05/2023   K 4.8 05/05/2023   CL 100 05/05/2023   CO2 30 05/05/2023   BUN 18 05/05/2023   CREATININE 0.81 05/05/2023   GFR 65.26 05/05/2023   CALCIUM  10.3 05/05/2023   PROT 7.5 01/01/2022   ALBUMIN 4.2 01/01/2022   LABGLOB 4.6 (H) 06/28/2019   BILITOT 0.7 01/01/2022   ALKPHOS 87 01/01/2022   AST 17 11/04/2022   ALT 24 11/04/2022   ANIONGAP 8 07/21/2019   Last lipids Lab Results  Component Value Date   CHOL 152 11/04/2022   HDL 61.90 11/04/2022   LDLCALC 63 11/04/2022   LDLDIRECT 118.5 08/25/2007   TRIG 135.0 11/04/2022   CHOLHDL 2 11/04/2022   Last hemoglobin A1c Lab Results  Component Value Date   HGBA1C 6.1 05/05/2023   Last thyroid  functions Lab Results  Component Value Date   TSH 0.67 06/15/2023   Last vitamin  D Lab Results  Component Value Date   VD25OH 53 08/24/2023      The ASCVD Risk score (Arnett DK, et al., 2019) failed to calculate for the following reasons:   The 2019 ASCVD risk score is only valid for ages 60 to 60    Assessment & Plan:   Problem List Items Addressed This Visit   None Visit Diagnoses       Bronchitis    -  Primary   Relevant Medications   azithromycin  (ZITHROMAX  Z-PAK) 250 MG tablet   predniSONE  (DELTASONE ) 10 MG tablet     Assessment and Plan    Acute Bronchitis   She has experienced a worsening productive cough since Sunday, following a cold that began last Tuesday, without fever, body aches, or sore throat. Her sputum is clear to slightly yellow, and wheezing was noted on examination. Considering the duration and symptoms, the differential includes viral bronchitis,  but a bacterial infection is also considered. We will start a Z-Pak (azithromycin ) for 5 days, taking 2 tablets on day 1, then 1 tablet daily for the next 4 days, and a prednisone  taper, taking 3 tablets daily for 3 days, then 2 tablets daily for 3 days, then 1 tablet daily for 3 days, to reduce inflammation and ease breathing. She should continue Mucinex for daytime cough, use over-the-counter Tigan for nighttime cough if needed, and take Tylenol  for headache if necessary. She is instructed to call the clinic on Thursday or Friday if symptoms do not improve.  General Health Maintenance   It is safe for her to take Tylenol  with Z-Pak and prednisone .  Follow-up   She should call the clinic on Thursday or Friday if not feeling better.        No follow-ups on file.    Isaish Alemu R Lowne Chase, DO

## 2023-11-09 ENCOUNTER — Encounter: Payer: Medicare HMO | Admitting: Internal Medicine

## 2023-11-17 ENCOUNTER — Encounter: Payer: Self-pay | Admitting: Internal Medicine

## 2023-11-17 ENCOUNTER — Ambulatory Visit (INDEPENDENT_AMBULATORY_CARE_PROVIDER_SITE_OTHER): Payer: HMO | Admitting: Internal Medicine

## 2023-11-17 VITALS — BP 126/80 | HR 82 | Temp 98.0°F | Resp 16 | Ht 63.0 in | Wt 154.5 lb

## 2023-11-17 DIAGNOSIS — E785 Hyperlipidemia, unspecified: Secondary | ICD-10-CM

## 2023-11-17 DIAGNOSIS — Z23 Encounter for immunization: Secondary | ICD-10-CM

## 2023-11-17 DIAGNOSIS — Z0000 Encounter for general adult medical examination without abnormal findings: Secondary | ICD-10-CM | POA: Diagnosis not present

## 2023-11-17 DIAGNOSIS — I10 Essential (primary) hypertension: Secondary | ICD-10-CM

## 2023-11-17 DIAGNOSIS — E119 Type 2 diabetes mellitus without complications: Secondary | ICD-10-CM | POA: Diagnosis not present

## 2023-11-17 DIAGNOSIS — M8589 Other specified disorders of bone density and structure, multiple sites: Secondary | ICD-10-CM

## 2023-11-17 DIAGNOSIS — Z0001 Encounter for general adult medical examination with abnormal findings: Secondary | ICD-10-CM

## 2023-11-17 DIAGNOSIS — E039 Hypothyroidism, unspecified: Secondary | ICD-10-CM | POA: Diagnosis not present

## 2023-11-17 LAB — BASIC METABOLIC PANEL
BUN: 15 mg/dL (ref 6–23)
CO2: 31 meq/L (ref 19–32)
Calcium: 9.7 mg/dL (ref 8.4–10.5)
Chloride: 99 meq/L (ref 96–112)
Creatinine, Ser: 0.77 mg/dL (ref 0.40–1.20)
GFR: 69.09 mL/min (ref >60.00)
Glucose, Bld: 122 mg/dL — ABNORMAL HIGH (ref 70–99)
Potassium: 5 meq/L (ref 3.5–5.1)
Sodium: 139 meq/L (ref 135–145)

## 2023-11-17 LAB — TSH: TSH: 0.12 u[IU]/mL — ABNORMAL LOW (ref 0.35–5.50)

## 2023-11-17 LAB — ALT: ALT: 20 U/L (ref 0–35)

## 2023-11-17 LAB — LIPID PANEL
Cholesterol: 139 mg/dL (ref 0–200)
HDL: 52.2 mg/dL (ref >39.00)
LDL Cholesterol: 62 mg/dL (ref 0–99)
NonHDL: 86.65
Total CHOL/HDL Ratio: 3
Triglycerides: 123 mg/dL (ref 0.0–149.0)
VLDL: 24.6 mg/dL (ref 0.0–40.0)

## 2023-11-17 LAB — HEMOGLOBIN A1C: Hgb A1c MFr Bld: 7 % — ABNORMAL HIGH (ref 4.6–6.5)

## 2023-11-17 LAB — AST: AST: 17 U/L (ref 0–37)

## 2023-11-17 NOTE — Patient Instructions (Addendum)
Vaccines I recommend: Shingrix (shingles)  Check the  blood pressure regularly Blood pressure goal:  between 110/65 and  135/85. If it is consistently higher or lower, let me know  Take levothyroxine away from omeprazole  GO TO THE LAB : Get the blood work     Next visit with me in 6 months Please schedule it at the front desk

## 2023-11-17 NOTE — Progress Notes (Unsigned)
Subjective:    Patient ID: Angela Clay, female    DOB: [birth year], 89 y.o.   MRN: [id number]  DOS:  11/17/2023 Type of visit - description: CPX  For CPX Since the last visit is doing well. Denies chest pain or difficulty breathing. No recent falls.   Review of Systems See above   Past Medical History:  Diagnosis Date   Actinic keratosis 05/2015   Right mid central dorsal hand   Autoimmune hepatitis (HCC)    Autoimmune hepatitis (HCC)    Cataract 04/2018   surgery delayed/now completed   Gallstones    GERD (gastroesophageal reflux disease)    EGD for dysphagia 2000 aprox (-)   History of radiation therapy    Hyperlipidemia    Hypertension    Hypothyroidism    Lymphoma (HCC)    neck,mlig,unspecified site dx 1970s   OA (osteoarthritis)    severe at neck   Osteopenia 07/20/2023   FRAX - increased risk of hip fracture   Pancreatic cyst    Pancreatic cyst    SCC (squamous cell carcinoma)    Syncope 11/2012   admited . w/u (-)   Thyroid cancer [facility])     Past Surgical History:  Procedure Laterality Date   APPENDECTOMY     BELPHAROPTOSIS REPAIR Bilateral 2017   BIOPSY  11/23/2019   Procedure: BIOPSY;  Surgeon: Lemar Lofty., MD;  Location: [facility];  Service: Gastroenterology;;   CATARACT EXTRACTION, BILATERAL Bilateral 01/10/2019   Dr.Rankin   CHOLECYSTECTOMY  02/24/2006   ESOPHAGOGASTRODUODENOSCOPY (EGD) WITH PROPOFOL N/A 11/23/2019   Procedure: ESOPHAGOGASTRODUODENOSCOPY (EGD) WITH PROPOFOL;  Surgeon: Lemar Lofty., MD;  Location: [facility];  Service: Gastroenterology;  Laterality: N/A;   EXPLORATORY LAPAROTOMY     for lymphadenomopathy; appendix removed at this time   LEFT HEART CATH AND CORONARY ANGIOGRAPHY N/A 07/20/2018   Procedure: LEFT HEART CATH AND CORONARY ANGIOGRAPHY;  Surgeon: Lyn Records, MD;  Location: [facility];  Service: Cardiovascular;  Laterality: N/A;   LIVER BIOPSY     THYROIDECTOMY     TONSILLECTOMY  AND ADENOIDECTOMY  1949   TUBAL LIGATION  1972   UPPER ESOPHAGEAL ENDOSCOPIC ULTRASOUND (EUS) N/A 11/23/2019   Procedure: UPPER ESOPHAGEAL ENDOSCOPIC ULTRASOUND (EUS);  Surgeon: Lemar Lofty., MD;  Location: [facility];  Service: Gastroenterology;  Laterality: N/A;  WITH BIOPSY   NEEDS 90 MINUTES   vitamacular hole surgery Right 2020   eye    Current Outpatient Medications  Medication Instructions   ACETAMINOPHEN PO 650 mg, Every 6 hours PRN   aspirin 81 mg, Daily at bedtime   atorvastatin (LIPITOR) 40 mg, Oral, Daily at bedtime   Calcium Carb-Cholecalciferol (CALCIUM 600 + D PO) 1 tablet, Daily   hydrochlorothiazide (HYDRODIURIL) 25 mg, Oral, Daily   levothyroxine (SYNTHROID) 125 mcg, Oral, Daily before breakfast   Menthol, Topical Analgesic, (BIOFREEZE) 4 % GEL Apply topically.   Multiple Vitamins-Minerals (CENTRUM SILVER 50+WOMEN) TABS 1 tablet, Daily   omeprazole (PRILOSEC) 20 mg, Oral, Daily PRN   Polyethyl Glycol-Propyl Glycol (SYSTANE ULTRA OP) 1 drop, Daily PRN   Tart Cherry 1200 MG CAPS 1 capsule, Daily   vitamin D3 2,000 Units, Daily       Objective:   Physical Exam BP 126/80   Pulse 82   Temp 98 F (36.7 C) (Oral)   Resp 16   Ht 5\' 3"  (1.6 m)   Wt 154 lb 8 oz (70.1 kg)   SpO2 96%   BMI  27.37 kg/m  General: Well developed, NAD, BMI noted Neck: No  thyromegaly  HEENT:  Normocephalic . Face symmetric, atraumatic Lungs:  CTA B Normal respiratory effort, no intercostal retractions, no accessory muscle use. Heart: RRR,  no murmur.  Abdomen:  Not distended, soft, non-tender. No rebound or rigidity.   Lower extremities: no pretibial edema bilaterally  Skin: Exposed areas without rash. Not pale. Not jaundice Neurologic:  alert & oriented X3.  Speech normal, gait appropriate for age and unassisted Strength symmetric and appropriate for age.  Psych: Cognition and judgment appear intact.  Cooperative with normal attention span and concentration.   Behavior appropriate. No anxious or depressed appearing.     Assessment     Assessment DM A1c 6.8 (04-2019) Hyperlipidemia HTN Hypothyroidism Gout  Autoimmune hepatitis Carotid artery disease [place] 11-2014: 60-79% RICA stenosis. 40-59% LICA stenosis [place] 02/2016: 1-39 % B. 1 year [place] 06/2017 1-39%, no further routine [place] DJD, severe at the neck GERD, dysphagia remotely Osteopenia: -s/p fosamax 2007 to 2012. -T score -1.7 (01-2015)  -T score -1.8 (04-2017)  - T-score -1.8 (06-2021) + Vitamin D deficiency Lymphoma,  Neck, 1970s Syncope, admitted-2014, workup negative Shingles: 10/02/2016, left arm Chest pain: Cath 07/19/2018, negative, pericarditis?  PLAN: Here for CPX -Tdap 2020 -PNM 23  booster 12-2017; prevnar 2015.  PNM 11/17/2023 - shingles shot  02-2013  - s/p RSV vaccine per pt  - had a flu shot and a covid vax - Vaccines I recommend:  Shingrix  --CCS: no recall  - Female care: saw gyn 07/2023  --MMG 12/2022([facility])  -- life style: Lives independently, no falls. - labs: BMP AST ALT FLP A1c TSH - POA on file -Information about fall prevention provided   Also, chronic medical problems were addressed  DM: Last A1c 6.1, diet controlled.  Recheck A1c.  HTN: BP today looks very good, continue HCTZ.  Check BMP High cholesterol: On atorvastatin, checking labs Hypothyroidism: Since LOV, levothyroxine increased to 125 mcg, follow-up TSH 0.67.  Recheck TSH. Recommend to take levothyroxine away from PPIs Osteopenia: Per gynecology, was rec Reclast, she is hesitant, I asked my opinion.  Advised benefits of Reclast outweigh risks of potential side effects.  She is not inclined to proceed. Aspirin: History of mild carotid artery disease.  Continue ASA RTC 6 months ==============  DM: Diet controlled, last A1c 7.0, she is trying to eat healthier with less carbohydrates.  Check A1c.  Further advised for results. Hyperlipidemia: On atorvastatin, well-controlled. HTN: On HCTZ.  After a  local steroid injection, BP was elevated, took carvedilol temporarily.  BP is now WNL.  Check BMP and CBC Hypothyroidism: On Synthroid, last TSH ~ 3.0 , recheck labs today, consider adjust medications to get TSH ~ 1.0. DJD: On Tylenol as needed, takes 2 tablets daily, that is okay, she is not overdosing. GERD: On Prilosec, denies dysphagia Vitamin D deficiency: Last level satisfactory. Vaccine advice provided. RTC 6 months.

## 2023-11-18 ENCOUNTER — Encounter: Payer: Self-pay | Admitting: Internal Medicine

## 2023-11-18 MED ORDER — LEVOTHYROXINE SODIUM 112 MCG PO TABS: 112.0000 ug | ORAL_TABLET | Freq: Every day | ORAL | 0 refills | Status: DC

## 2023-11-18 NOTE — Assessment & Plan Note (Signed)
Here for CPX -Tdap 2020 -PNM 23  booster 12-2017; prevnar 2015.  PNM 11/17/2023 - shingles shot  02-2013  - s/p RSV vaccine per pt  - had a flu shot and a covid vax - Vaccines I recommend:  Shingrix  --CCS: no recall  - Female care: saw gyn 07/2023  --MMG 12/2022([facility])  -- life style: Lives independently, no falls. - labs: BMP AST ALT FLP A1c TSH - POA on file -Information about fall prevention provided

## 2023-11-18 NOTE — Assessment & Plan Note (Signed)
Here for CPX    Also, chronic medical problems were addressed DM: Last A1c 6.1, diet controlled.  Recheck A1c. HTN: BP today looks very good, continue HCTZ.  Check BMP High cholesterol: On atorvastatin, checking labs Hypothyroidism: Since LOV, levothyroxine increased to 125 mcg, follow-up TSH 0.67.  Recheck TSH. Recommend to take levothyroxine away from PPIs Osteopenia: Per gynecology, was rec Reclast, she is hesitant, I asked my opinion.  Advised benefits of Reclast outweigh risks of potential side effects.   Aspirin: History of mild carotid artery disease.  Continue ASA RTC 6 months

## 2023-11-18 NOTE — Addendum Note (Signed)
Addended byConrad Harding D on: 11/18/2023 02:57 PM   Modules accepted: Orders

## 2023-12-10 ENCOUNTER — Other Ambulatory Visit: Payer: Self-pay | Admitting: Internal Medicine

## 2023-12-28 ENCOUNTER — Encounter: Payer: Self-pay | Admitting: Internal Medicine

## 2023-12-28 ENCOUNTER — Other Ambulatory Visit (INDEPENDENT_AMBULATORY_CARE_PROVIDER_SITE_OTHER): Payer: HMO

## 2023-12-28 DIAGNOSIS — E039 Hypothyroidism, unspecified: Secondary | ICD-10-CM

## 2023-12-28 LAB — TSH: TSH: 0.45 u[IU]/mL (ref 0.35–5.50)

## 2023-12-30 NOTE — Progress Notes (Signed)
 12/30/2023 Angela Clay [id number] Dec 31, 1933   Chief Complaint: Follow up difficulty swallowing   History of Present Illness: Angela Clay is an 89 year old female with a past medical history of hypertension, hyperlipidemia, pericarditis, hypothyroidism, lymphoma s/p radiation 1970s , thyroid cancer, GERD, diverticulosis, pancreatic cyst and autoimmune hepatitis.  Past cholecystectomy secondary to gallstones.  She is known  by Dr. Meridee Score. She underwent an EGD/EUS 11/23/2019 to follow-up on her known pancreatic cyst.  The EGD component identified multiple gastric fundic gland polyps, a single duodenal polyp and candidiasis esophagitis which was treated with Fluconazole. Gastric biopsies were consistent with mild gastritis, no evidence of H. pylori.  Duodenal biopsies showed peptic duodenitis.  The EUS showed a cystic lesion in the pancreatic body, endosonographic appearance was consistent with a branched intraductal papillary mucinous neoplasm, biopsies were not obtained. She underwent a follow-up abdominal MRI/MRCP 05/30/2020 which showed a stable cystic lesion in the body of the pancreas measuring 11 mm which likely communicates with the pancreatic duct suggestive of a small sidebranch IPMN.  No worrisome MR imaging features. She was last seen by Dr. Meridee Score 03/30/2020, her liver biopsies from [facility] were concerning for autoimmune hepatitis.  Her LFTs were improving and she was asymptomatic and therefore she did not wish to pursue immunosuppressive therapy.  A follow-up abdominal MRI/MRCP 04/11/2022 showed no significant change in the small cystic lesion of the proximal pancreatic body head measuring 1.1 cm.  I last saw Angela Clay in office for further evaluation for dysphagia. A barium swallow was recommended, however, she did not wish to pursue.  Empiric treatment with fluconazole and repeat EGD were considered if her dysphagia symptoms worsened.  She presents today for further follow-up  regarding dysphagia.  She stated that she was doing fine regarding her swallowing and the only time she experiences difficulty swallowing occurs when she eats eggs or grits which gets briefly stuck in her throat/upper esophagus, she coughs or gags and the stuck food comes out.  No heartburn.  No upper or lower abdominal pain.  No bloody or black stools.  She takes omeprazole 20 mg daily.      Latest Ref Rng & Units 05/05/2023   10:57 AM 07/01/2022    2:09 PM 09/12/2021   11:19 AM  CBC  WBC 4.0 - 10.5 K/uL 6.1  6.4  5.5   Hemoglobin 12.0 - 15.0 g/dL 40.9  81.1  91.4   Hematocrit 36.0 - 46.0 % 39.5  37.9  39.0   Platelets 150.0 - 400.0 K/uL 215.0  180.0  181.0        Latest Ref Rng & Units 11/17/2023   11:42 AM 05/05/2023   10:57 AM 11/04/2022   10:58 AM  CMP  Glucose 70 - 99 mg/dL 782  956  213   BUN 6 - 23 mg/dL 15  18  22    Creatinine 0.40 - 1.20 mg/dL 0.86  5.78  4.69   Sodium 135 - 145 mEq/L 139  139  142   Potassium 3.5 - 5.1 mEq/L 5.0  4.8  4.0   Chloride 96 - 112 mEq/L 99  100  99   CO2 19 - 32 mEq/L 31  30  33   Calcium 8.4 - 10.5 mg/dL 9.7  62.9  9.8   AST 0 - 37 U/L 17   17   ALT 0 - 35 U/L 20   24     Abdominal MRI/MRCP with and without contrast 05/30/2020:  CONTRAST:  7mL GADAVIST GADOBUTROL 1 MMOL/ML IV SOLN   COMPARISON:  MRI abdomen 08/29/2019   FINDINGS: Lower chest: The lung bases are clear of an acute process. No worrisome pulmonary lesions. No pleural or pericardial effusion. Stable mild tortuosity and ectasia of the lower thoracic aorta.   Hepatobiliary: No worrisome hepatic lesions are identified. The portal and hepatic veins are patent. Stable mild intra and extrahepatic biliary dilatation likely due to the patient's age and prior cholecystectomy. No common bile duct stones are identified.   Pancreas: Stable small multi septated cystic lesion in the body region of the pancreas. It measures a maximum of 11 mm. It likely communicates with the pancreatic duct  and is likely a small side branch IPMN. No worrisome MR imaging features. No evidence of enhancement or nodularity. No dilatation of the pancreatic duct proximal to the lesion. The remainder of the pancreas is unremarkable. No inflammatory changes.   Spleen:  Normal size.  No focal lesions.   Adrenals/Urinary Tract: The adrenal glands and kidneys are unremarkable. Small stable renal cysts.   Stomach/Bowel: The stomach, duodenum, visualized small bowel and visualized colon are unremarkable.   Vascular/Lymphatic: The aorta is normal in caliber. No dissection. The branch vessels are patent. The major venous structures are patent. No mesenteric or retroperitoneal lymphadenopathy.   Other:  No ascites or abdominal wall hernia.   Musculoskeletal: No significant bony findings.   IMPRESSION: 1. Stable small multi septated cystic lesion in the body region of the pancreas likely a small side branch IPMN. No worrisome MR imaging features. Recommend follow-up MR examination in 2 years. 2. Stable mild intra and extrahepatic biliary dilatation likely due to the patient's age and prior cholecystectomy. No common bile duct stones. 3. Small stable renal cysts.  Abdominal MRI MRCP with and without contrast 04/11/2022:  FINDINGS: Lower chest: No acute findings.   Hepatobiliary: No mass or other parenchymal abnormality identified. Status post cholecystectomy. Postoperative biliary ductal dilatation.   Pancreas: No significant change in a thinly septated small cystic lesion of the proximal pancreatic body measuring up to 1.1 cm, adjacent to the main pancreatic duct (series 18, image 27, series 11, image 47). No associated solid component or contrast enhancement. No solid mass, inflammatory changes, or other parenchymal abnormality identified.No pancreatic ductal dilatation.   Spleen:  Within normal limits in size and appearance.   Adrenals/Urinary Tract: Normal adrenal glands. Simple,  benign bilateral renal cortical cysts, for which no further follow-up or characterization is required. No renal masses or suspicious contrast enhancement identified. No evidence of hydronephrosis.   Stomach/Bowel: Visualized portions within the abdomen are unremarkable.   Vascular/Lymphatic: No pathologically enlarged lymph nodes identified. No abdominal aortic aneurysm demonstrated. Aortic atherosclerosis.   Other:  None.   Musculoskeletal: No suspicious osseous lesions identified.   IMPRESSION: 1. No significant change in a thinly septated small cystic lesion of the proximal pancreatic body measuring up to 1.1 cm. No associated solid component or contrast enhancement. No pancreatic ductal dilatation. As there is no observed increased risk of malignancy for such lesions smaller than 2 cm, no further follow-up or characterization is required, particularly given advanced patient age and well established initial stability. 2. No MR abnormality of the liver, per reported history of autoimmune hepatitis. 3. Status post cholecystectomy.  Aortic Atherosclerosis   PAST GI PROCEDURES:   EGD/EUS 11/23/2019: EGD Impression: - No gross lesions in esophagus proximally. White nummular lesions in esophageal mucosa in mid-esophagus - biopsied. No gross lesions in esophagus distally.  Z-line regular, 38 cm from the incisors. - Multiple gastric polyps - likely fundic gland. Biopsied. Erythematous mucosa in the gastric body. No other gross lesions in the stomach. Biopsied for HP. - A single duodenal polyp. Resected and retrieved. No other gross mucosal lesions in the duodenal bulb, in the first portion of the duodenum and in the second portion of the duodenum. Biopsied for Celiac. - Normal major papilla under a hood.  EUS Impression: - A cystic lesion was seen in the pancreatic body. Tissue has not been obtained. However, the endosonographic appearance is consistent with a branched  intraductal papillary mucinous neoplasm in similar nature to prior MRICP imaging. - There was no other sign of significant pathology in the pancreatic head, genu of the pancreas, pancreatic body and pancreatic tail. - There was a heterogenous echotexture in the visualized portion of the liver. No masses or lesions. - No evidence of choledocholithiasis or ampullary lesion. - No malignant-appearing lymph nodes were visualized in the celiac region (level 20), perigastric region, peripancreatic region and porta hepatis region. SURGICAL PATHOLOGY  CASE: [id number]  PATIENT: Angela Clay  Surgical Pathology Report   FINAL MICROSCOPIC DIAGNOSIS:   A. DUODENUM, BIOPSY:  - Peptic duodenitis.  - No dysplasia or malignancy.   B. DUODENUM, POLYPECTOMY:  - Nodular peptic duodenitis.  - No dysplasia or malignancy.   C. STOMACH, BIOPSY:  - Mild chronic gastritis with reactive changes.  - Warthin-Starry is negative for Helicobacter pylori.  - No intestinal metaplasia, dysplasia, or malignancy.   D. STOMACH, POLYPECTOMY:  - Fundic gland polyp(s).  - No intestinal metaplasia, dysplasia, or malignancy.   E. ESOPHAGUS, BIOPSY:  - Candida esophagitis.  - PAS highlights fungal organisms.  - No intestinal metaplasia, dysplasia, or malignancy.    Current Outpatient Medications on File Prior to Visit  Medication Sig Dispense Refill   ACETAMINOPHEN PO Take 650 mg by mouth every 6 (six) hours as needed for mild pain. Tylenol Arthritis     aspirin 81 MG tablet Take 81 mg by mouth at bedtime.     atorvastatin (LIPITOR) 40 MG tablet Take 1 tablet (40 mg total) by mouth at bedtime. 90 tablet 1   Calcium Carb-Cholecalciferol (CALCIUM 600 + D PO) Take 1 tablet by mouth daily.     Cholecalciferol (VITAMIN D3) 50 MCG (2000 UT) CAPS Take 2,000 Units by mouth daily.     hydrochlorothiazide (HYDRODIURIL) 25 MG tablet Take 1 tablet (25 mg total) by mouth daily. 90 tablet 1   levothyroxine (SYNTHROID) 112  MCG tablet Take 1 tablet (112 mcg total) by mouth daily before breakfast. 90 tablet 0   Menthol, Topical Analgesic, (BIOFREEZE) 4 % GEL Apply topically.     Multiple Vitamins-Minerals (CENTRUM SILVER 50+WOMEN) TABS Take 1 tablet by mouth daily.     omeprazole (PRILOSEC) 20 MG capsule Take 1 capsule (20 mg total) by mouth daily as needed. 90 capsule 1   Polyethyl Glycol-Propyl Glycol (SYSTANE ULTRA OP) Place 1 drop into both eyes daily as needed (Dry eyes).     Tart Cherry 1200 MG CAPS Take 1 capsule by mouth daily. (Patient not taking: Reported on 12/31/2023)     No current facility-administered medications on file prior to visit.   Allergies  Allergen Reactions   Codeine Nausea And Vomiting   Current Medications, Allergies, Past Medical History, Past Surgical History, Family History and Social History were reviewed in [employer] record.  Review of Systems:   Constitutional: Negative for fever, sweats, chills  or weight loss.  Respiratory: Negative for shortness of breath.   Cardiovascular: Negative for chest pain, palpitations and leg swelling.  Gastrointestinal: See HPI.  Musculoskeletal: Negative for back pain or muscle aches.  Neurological: Negative for dizziness, headaches or paresthesias.   Physical Exam: BP 120/72   Pulse 60   Ht 5\' 3"  (1.6 m)   Wt 152 lb 4 oz (69.1 kg)   BMI 26.97 kg/m   General: 89 year old female in no acute distress. Head: Normocephalic and atraumatic. Eyes: No scleral icterus. Conjunctiva pink . Ears: Normal auditory acuity. Mouth: Dentition intact. No ulcers or lesions. No oral thrush. Lungs: Clear throughout to auscultation. Heart: Regular rate and rhythm, no murmur. Abdomen: Soft, nontender and nondistended. No masses or hepatomegaly. Normal bowel sounds x 4 quadrants.  Rectal: Deferred.  Musculoskeletal: Symmetrical with no gross deformities. Extremities: No edema. Neurological: Alert oriented x 4. No focal deficits.   Psychological: Alert and cooperative. Normal mood and affect  Assessment and Recommendations:  89 year old female with dysphagia, suspect oral pharyngeal dysphagia. EGD 11/23/2019 identified multiple gastric fundic gland polyps, a single duodenal polyp and candidiasis esophagitis which was treated with Fluconazole. Dysphagia symptoms have improved and occur only when eating eggs or grits. She previously declined a barium swallow study but she wishes to pursue at this time.  No heartburn or abdominal pain. -Barium swallow study with tablet -Avoid eggs and grits -Take 3 sips of tap water prior to swallowing any food or pills/tablets -Cut food into small pieces, chew food thoroughly and avoid eating large pieces of bread, meat or rice -Continue Omeprazole 20 mg daily -ENT consult including laryngoscopy if barium swallow study unrevealing  Autoimmune hepatitis, not on immunosuppressive therapy as she remains asymptomatic with normal LFTs. ALT 20 on 11/17/2023. IgG level 1,228 on 01/01/2022.  -Recommend checking annual LFTs with PCP   History of a 11 mm cystic lesion in the body of the pancreas s/p EUS 11/23/2019 showed a cystic lesion in the pancreatic body, endosonographic appearance was consistent with a branched intraductal papillary mucinous neoplasm. Prior abdominal MRI/MRCP 05/30/2020 showed a stable cystic lesion measuring 11 mm to the body of the pancreas. MRI/MRCP 04/11/2022 showed no significant change in the small cystic lesion of the proximal pancreatic body head measuring 1.1 cm. A follow up MRI/MRCP in 2 years was to be considered as previously recommended by Dr. Meridee Score. -Dr. Meridee Score to verify if any further surveillance abdominal MRI/MRCPs to be pursued as patient is 89 years old

## 2023-12-31 ENCOUNTER — Ambulatory Visit: Payer: HMO | Admitting: Nurse Practitioner

## 2023-12-31 ENCOUNTER — Encounter: Payer: Self-pay | Admitting: Nurse Practitioner

## 2023-12-31 VITALS — BP 120/72 | HR 60 | Ht 63.0 in | Wt 152.2 lb

## 2023-12-31 DIAGNOSIS — Z8719 Personal history of other diseases of the digestive system: Secondary | ICD-10-CM

## 2023-12-31 DIAGNOSIS — R1310 Dysphagia, unspecified: Secondary | ICD-10-CM | POA: Diagnosis not present

## 2023-12-31 NOTE — Patient Instructions (Addendum)
 Avoid eating large pieces of meat, bread, or rice.  Avoid eggs and grits.   You have been scheduled for a Barium Esophogram at [facility] (1st floor of the hospital) on Friday, 01/08/24 at 11:00 AM. Please arrive 30 minutes prior to your appointment for registration. Make certain not to have anything to eat or drink 3 hours prior to your test. If you need to reschedule for any reason, please contact radiology at [phone] to do so. __________________________________________________________________ A barium swallow is an examination that concentrates on views of the esophagus. This tends to be a double contrast exam (barium and two liquids which, when combined, create a gas to distend the wall of the oesophagus) or single contrast (non-ionic iodine based). The study is usually tailored to your symptoms so a good history is essential. Attention is paid during the study to the form, structure and configuration of the esophagus, looking for functional disorders (such as aspiration, dysphagia, achalasia, motility and reflux) EXAMINATION You may be asked to change into a gown, depending on the type of swallow being performed. A radiologist and radiographer will perform the procedure. The radiologist will advise you of the type of contrast selected for your procedure and direct you during the exam. You will be asked to stand, sit or lie in several different positions and to hold a small amount of fluid in your mouth before being asked to swallow while the imaging is performed .In some instances you may be asked to swallow barium coated marshmallows to assess the motility of a solid food bolus. The exam can be recorded as a digital or video fluoroscopy procedure. POST PROCEDURE It will take 1-2 days for the barium to pass through your system. To facilitate this, it is important, unless otherwise directed, to increase your fluids for the next 24-48hrs and to resume your normal diet.  This test  typically takes about 30 minutes to perform. __________________________________________________________________________________   Thank you for trusting me with your gastrointestinal care!   Alcide Evener, CRNP   Patient

## 2024-01-03 NOTE — Progress Notes (Signed)
 Attending Physician's Attestation   I have reviewed the chart.   I agree with the Advanced Practitioner's note, impression, and recommendations with any updates as below. At her age and with the overall size of lesion a few years ago, I think we can forego further Abdominal MRI/MRCP.  If patient, however, wants 1 last MRI/MRCP, because she is relatively healthy after talking with her, then it can be ordered and then hopefully that would be able to complete her surveillance.  Please update me with what she decides.   Corliss Parish, MD [facility] Advanced Endoscopy Office # [phone]

## 2024-01-07 NOTE — Progress Notes (Signed)
 I called patient and discussed Dr. Elesa Hacker input below and she does not wish to pursue any further abdominal MRIs. Her barium swallow study is scheduled tomorrow. I will contact her with the BS results once received. Patient aware that it might take one or two weeks to get the BS results.

## 2024-01-08 ENCOUNTER — Ambulatory Visit (HOSPITAL_COMMUNITY)
Admission: RE | Admit: 2024-01-08 | Discharge: 2024-01-08 | Disposition: A | Discharge status: Home / Self Care | Source: Ambulatory Visit | Attending: Nurse Practitioner | Admitting: Nurse Practitioner

## 2024-01-08 DIAGNOSIS — K2289 Other specified disease of esophagus: Secondary | ICD-10-CM | POA: Diagnosis not present

## 2024-01-08 DIAGNOSIS — R1310 Dysphagia, unspecified: Secondary | ICD-10-CM | POA: Diagnosis not present

## 2024-01-08 DIAGNOSIS — K449 Diaphragmatic hernia without obstruction or gangrene: Secondary | ICD-10-CM | POA: Diagnosis not present

## 2024-01-08 DIAGNOSIS — K224 Dyskinesia of esophagus: Secondary | ICD-10-CM | POA: Diagnosis not present

## 2024-01-11 DIAGNOSIS — Z1231 Encounter for screening mammogram for malignant neoplasm of breast: Secondary | ICD-10-CM | POA: Diagnosis not present

## 2024-01-11 LAB — HM MAMMOGRAPHY

## 2024-01-12 ENCOUNTER — Encounter: Payer: Self-pay | Admitting: Internal Medicine

## 2024-01-15 ENCOUNTER — Telehealth: Payer: Self-pay | Admitting: Nurse Practitioner

## 2024-01-15 NOTE — Telephone Encounter (Signed)
 Inbound call from patient stating  that she has decided that she does not want to have the endoscopy, she just wanted to inform us.

## 2024-01-18 NOTE — Telephone Encounter (Signed)
 Dr. Meridee Score, FYI, patient does not wish to pursue an EGD at this juncture.   Angela Clay, please let the patient know I received her message and that she should contact our office if her swallowing difficulties persist or worsen.  Thank you

## 2024-01-19 NOTE — Telephone Encounter (Signed)
 I spoke to [facility] and I advised her that we received her message and I explained that if her swallowing difficulties persist or worsen, she should give [place] a call.  She advised that the main reason she decided not to have the procedure was because of the anesthesia and her age.

## 2024-01-19 NOTE — Telephone Encounter (Signed)
 Glad to hear that she is doing better. We can be available again in the future if needed. GM

## 2024-02-02 ENCOUNTER — Other Ambulatory Visit: Payer: Self-pay | Admitting: Internal Medicine

## 2024-04-01 ENCOUNTER — Encounter: Payer: Self-pay | Admitting: Family Medicine

## 2024-04-01 ENCOUNTER — Ambulatory Visit (INDEPENDENT_AMBULATORY_CARE_PROVIDER_SITE_OTHER): Admitting: Family Medicine

## 2024-04-01 VITALS — BP 143/81 | HR 82 | Ht 63.0 in | Wt 151.0 lb

## 2024-04-01 DIAGNOSIS — L0291 Cutaneous abscess, unspecified: Secondary | ICD-10-CM | POA: Diagnosis not present

## 2024-04-01 DIAGNOSIS — H6121 Impacted cerumen, right ear: Secondary | ICD-10-CM

## 2024-04-01 MED ORDER — DOXYCYCLINE HYCLATE 100 MG PO TABS: 100.0000 mg | ORAL_TABLET | Freq: Two times a day (BID) | ORAL | 0 refills | Status: AC

## 2024-04-01 NOTE — Progress Notes (Signed)
 Acute Office Visit  Subjective:     Patient ID: Angela Clay, female    DOB: 27-Aug-1934, 89 y.o.   MRN: [id number]  Chief Complaint  Patient presents with   Ear Problem   Skin Problem    HPI Patient is in today for cyst, ear fullness.   Discussed the use of AI scribe software for clinical note transcription with the patient, who gave verbal consent to proceed.  History of Present Illness Angela Clay "Angela Clay" is an 89 year old female who presents with a swollen and draining neck cyst.  She has had a cyst on her neck for years, which has been monitored by her dermatologist. Recently, the cyst became swollen, started draining, and emitted an odor. She noticed yellow discharge and felt unwell. The cyst is sensitive, feels swollen and tight, and she has difficulty seeing it without a mirror.  She may have experienced a slight fever and achiness but is unsure. She has an appointment scheduled for an evaluation of the cyst's removal on Monday.  She reports issues with her ears, specifically a feeling of fullness in the right ear.         ROS All review of systems negative except what is listed in the HPI      Objective:    BP (!) 143/81   Pulse 82   Ht 5\' 3"  (1.6 m)   Wt 151 lb (68.5 kg)   SpO2 96%   BMI 26.75 kg/m    Physical Exam Vitals reviewed.  HENT:     Right Ear: There is impacted cerumen.  Skin:    Findings: Lesion present.  Neurological:     Mental Status: She is alert and oriented to person, place, and time.  Psychiatric:        Mood and Affect: Mood normal.        Behavior: Behavior normal.        Thought Content: Thought content normal.        No results found for any visits on 04/01/24.      Assessment & Plan:   Problem List Items Addressed This Visit   None Visit Diagnoses       Abscess    -  Primary   Relevant Medications   doxycycline  (VIBRA -TABS) 100 MG tablet   Other Relevant Orders   WOUND CULTURE       Assessment  & Plan  Posterior neck cyst infection/abscess causing discomfort.  - I&D of fluctuant area. - Prescribe doxycycline  - medication discussed  - Keep clean, warm compresses throughout the day. - Culture drainage to adjust antibiotics if necessary. - Advise continuation with dermatology appointment on Monday for cyst evaluation and potential removal post-infection.  Cerumen impaction Cerumen impaction resolved with irrigation. - Monitor for resolution of ear fullness as fluid drains. Indication: Cerumen impaction of the ear(s)  Medical necessity statement: On physical examination, cerumen impairs clinically significant portions of the external auditory canal, and tympanic membrane. Noted obstructive, copious cerumen that cannot be removed without magnification and instrumentations requiring professional removal.   Consent: Discussed benefits and risks of procedure and verbal consent obtained  Procedure: Patient was prepped for the procedure. Otoscope utilized to assess and take note of the ear canal, the tympanic membrane, and the presence, amount, and placement of the cerumen.  Gentle irrigation with warm water. Excess water drained by gravity and ear canal(s) dried with clean guaze.  Post procedure examination: Otoscopic examination reveals complete cerumen removal with no  damage to the auditory canal, tympanic membrane, or surrounding tissue.  Patient tolerated procedure well.   Post procedure instructions: Patient made aware that they may experience temporary vertigo, temporary changes in hearing, and temporary discomfort. If these symptom last for more than 24 hours to call the clinic or proceed to the ED for further evaluation. Discussed avoiding placing objects into the ear canal for cleaning.    Incision and Drainage Procedure Note  Pre-operative Diagnosis: cyst/abscess   Post-operative Diagnosis: same  Indications: discomfort, infection  Anesthesia: local, spray  Procedure  Details  The procedure, risks and complications have been discussed in detail (including, but not limited to airway compromise, infection, bleeding) with the patient, and the patient has signed consent to the procedure.  The skin was sterilely prepped and draped over the affected area in the usual fashion. After adequate local anesthesia, I&D with a #11 blade was performed on the right poster neck. Purulent drainage: present The patient was observed until stable.  Findings: Stable, purulent drainage, cyst remains  EBL: scant  Drains: n/a  Condition: Tolerated procedure well and Stable   Complications: none.       Meds ordered this encounter  Medications   doxycycline  (VIBRA -TABS) 100 MG tablet    Sig: Take 1 tablet (100 mg total) by mouth 2 (two) times daily for 5 days.    Dispense:  10 tablet    Refill:  0    Supervising Provider:   Randie Bustle A [[id number]]    Return if symptoms worsen or fail to improve.  Angela Hock, NP

## 2024-04-04 DIAGNOSIS — L720 Epidermal cyst: Secondary | ICD-10-CM | POA: Diagnosis not present

## 2024-04-04 DIAGNOSIS — Z85828 Personal history of other malignant neoplasm of skin: Secondary | ICD-10-CM | POA: Diagnosis not present

## 2024-04-05 ENCOUNTER — Ambulatory Visit: Payer: Self-pay | Admitting: Family Medicine

## 2024-04-05 LAB — WOUND CULTURE
MICRO NUMBER:: 16548988
RESULT:: NO GROWTH
SPECIMEN QUALITY:: ADEQUATE

## 2024-04-15 ENCOUNTER — Other Ambulatory Visit: Payer: Self-pay

## 2024-04-15 ENCOUNTER — Telehealth: Payer: Self-pay

## 2024-04-15 DIAGNOSIS — K754 Autoimmune hepatitis: Secondary | ICD-10-CM

## 2024-04-15 DIAGNOSIS — K862 Cyst of pancreas: Secondary | ICD-10-CM

## 2024-04-15 NOTE — Telephone Encounter (Signed)
-----   Message from Nurse Bea Lime sent at 04/15/2022 10:30 AM EDT ----- Angela Clay, pls enter a 2 year recall for a follow up abd MRI/MRCP w/wo contrat. THX.  Sent 04/15/2022

## 2024-04-15 NOTE — Telephone Encounter (Signed)
 Pt was made aware of reminder that was received in Epic.  Pt was ordered and scheduled for the MRI on 04/20/2024 at 4:00 PM  at [facility]. Pt to arrive at 3:30 PM. Nothing to eat or drink 4 hours prior. Pt made aware.  My Chart message was sent to pt also.  Pt verbalized understanding with all questions answered.

## 2024-04-20 ENCOUNTER — Ambulatory Visit (HOSPITAL_COMMUNITY)
Admission: RE | Admit: 2024-04-20 | Discharge: 2024-04-20 | Disposition: A | Discharge status: Home / Self Care | Source: Ambulatory Visit | Attending: Nurse Practitioner | Admitting: Nurse Practitioner

## 2024-04-20 ENCOUNTER — Other Ambulatory Visit: Payer: Self-pay | Admitting: Nurse Practitioner

## 2024-04-20 DIAGNOSIS — K862 Cyst of pancreas: Secondary | ICD-10-CM | POA: Insufficient documentation

## 2024-04-20 DIAGNOSIS — Z9049 Acquired absence of other specified parts of digestive tract: Secondary | ICD-10-CM | POA: Diagnosis not present

## 2024-04-20 DIAGNOSIS — K754 Autoimmune hepatitis: Secondary | ICD-10-CM | POA: Diagnosis not present

## 2024-04-20 DIAGNOSIS — N281 Cyst of kidney, acquired: Secondary | ICD-10-CM | POA: Diagnosis not present

## 2024-04-20 DIAGNOSIS — I700 Atherosclerosis of aorta: Secondary | ICD-10-CM | POA: Diagnosis not present

## 2024-04-20 MED ORDER — GADOBUTROL 1 MMOL/ML IV SOLN
7.0000 mL | Freq: Once | INTRAVENOUS | Status: AC | PRN
  Administered 2024-04-20: 7 mL via INTRAVENOUS

## 2024-04-25 ENCOUNTER — Ambulatory Visit: Payer: Self-pay | Admitting: Nurse Practitioner

## 2024-05-02 DIAGNOSIS — Z85828 Personal history of other malignant neoplasm of skin: Secondary | ICD-10-CM | POA: Diagnosis not present

## 2024-05-02 DIAGNOSIS — L80 Vitiligo: Secondary | ICD-10-CM | POA: Diagnosis not present

## 2024-05-02 DIAGNOSIS — L821 Other seborrheic keratosis: Secondary | ICD-10-CM | POA: Diagnosis not present

## 2024-05-02 DIAGNOSIS — L570 Actinic keratosis: Secondary | ICD-10-CM | POA: Diagnosis not present

## 2024-05-02 DIAGNOSIS — L720 Epidermal cyst: Secondary | ICD-10-CM | POA: Diagnosis not present

## 2024-05-17 ENCOUNTER — Ambulatory Visit: Payer: HMO | Admitting: Internal Medicine

## 2024-05-17 ENCOUNTER — Encounter: Payer: Self-pay | Admitting: Internal Medicine

## 2024-05-17 VITALS — BP 122/84 | HR 51 | Temp 98.0°F | Resp 16 | Ht 63.0 in | Wt 152.2 lb

## 2024-05-17 DIAGNOSIS — L989 Disorder of the skin and subcutaneous tissue, unspecified: Secondary | ICD-10-CM | POA: Diagnosis not present

## 2024-05-17 DIAGNOSIS — I10 Essential (primary) hypertension: Secondary | ICD-10-CM

## 2024-05-17 DIAGNOSIS — E119 Type 2 diabetes mellitus without complications: Secondary | ICD-10-CM

## 2024-05-17 DIAGNOSIS — E039 Hypothyroidism, unspecified: Secondary | ICD-10-CM

## 2024-05-17 MED ORDER — BLOOD PRESSURE KIT: PACK | 0 refills | Status: AC

## 2024-05-17 NOTE — Progress Notes (Signed)
 Subjective:    Patient ID: Angela Clay, female    DOB: [birth year], 89 y.o.   MRN: [id number]  DOS:  05/17/2024 Type of visit - description: Follow-up  Chronic medical problems addressed. Feeling well. Good med compliance. No recent ambulatory BPs. Noted to have a skin lesion at the R calf, noted a week ago after she shaved.   Review of Systems See above   Past Medical History:  Diagnosis Date   Actinic keratosis 05/2015   Right mid central dorsal hand   Autoimmune hepatitis (HCC)    Autoimmune hepatitis (HCC)    Cataract 04/2018   surgery delayed/now completed   Gallstones    GERD (gastroesophageal reflux disease)    EGD for dysphagia 2000 aprox (-)   History of radiation therapy    Hyperlipidemia    Hypertension    Hypothyroidism    Lymphoma (HCC)    neck,mlig,unspecified site dx 1970s   OA (osteoarthritis)    severe at neck   Osteopenia 07/20/2023   FRAX - increased risk of hip fracture   Pancreatic cyst    Pancreatic cyst    SCC (squamous cell carcinoma)    Syncope 11/2012   admited . w/u (-)   Thyroid  cancer [facility])     Past Surgical History:  Procedure Laterality Date   APPENDECTOMY     BELPHAROPTOSIS REPAIR Bilateral 2017   BIOPSY  11/23/2019   Procedure: BIOPSY;  Surgeon: Wilhelmenia Aloha Raddle., MD;  Location: [facility];  Service: Gastroenterology;;   CATARACT EXTRACTION, BILATERAL Bilateral 01/10/2019   Dr.Rankin   CHOLECYSTECTOMY  02/24/2006   ESOPHAGOGASTRODUODENOSCOPY (EGD) WITH PROPOFOL  N/A 11/23/2019   Procedure: ESOPHAGOGASTRODUODENOSCOPY (EGD) WITH PROPOFOL ;  Surgeon: Wilhelmenia Aloha Raddle., MD;  Location: [facility];  Service: Gastroenterology;  Laterality: N/A;   EXPLORATORY LAPAROTOMY     for lymphadenomopathy; appendix removed at this time   LEFT HEART CATH AND CORONARY ANGIOGRAPHY N/A 07/20/2018   Procedure: LEFT HEART CATH AND CORONARY ANGIOGRAPHY;  Surgeon: Claudene Victory ORN, MD;  [facility];  Service:  Cardiovascular;  Laterality: N/A;   LIVER BIOPSY     THYROIDECTOMY     TONSILLECTOMY AND ADENOIDECTOMY  1949   TUBAL LIGATION  1972   UPPER ESOPHAGEAL ENDOSCOPIC ULTRASOUND (EUS) N/A 11/23/2019   Procedure: UPPER ESOPHAGEAL ENDOSCOPIC ULTRASOUND (EUS);  Surgeon: Wilhelmenia Aloha Raddle., MD;  Location: [facility];  Service: Gastroenterology;  Laterality: N/A;  WITH BIOPSY   NEEDS 90 MINUTES   vitamacular hole surgery Right 2020   eye    Current Outpatient Medications  Medication Instructions   ACETAMINOPHEN  PO 650 mg, Every 6 hours PRN   aspirin  81 mg, Daily at bedtime   atorvastatin  (LIPITOR ) 40 mg, Oral, Daily at bedtime   Blood Pressure KIT Check blood pressures once daily as needed   Calcium  Carb-Cholecalciferol (CALCIUM  600 + D PO) 1 tablet, Daily   hydrochlorothiazide  (HYDRODIURIL ) 25 mg, Oral, Daily   levothyroxine  (SYNTHROID ) 112 mcg, Oral, Daily before breakfast   Menthol, Topical Analgesic, (BIOFREEZE) 4 % GEL Apply topically.   Multiple Vitamins-Minerals (CENTRUM SILVER 50+WOMEN) TABS 1 tablet, Daily   omeprazole  (PRILOSEC) 20 mg, Oral, Daily PRN   Polyethyl Glycol-Propyl Glycol (SYSTANE ULTRA OP) 1 drop, Daily PRN   Tart Cherry 1200 MG CAPS 1 capsule, Daily   vitamin D3 2,000 Units, Daily       Objective:   Physical Exam BP 122/84   Pulse (!) 51   Temp 98 F (36.7 C) (Oral)  Resp 16   Ht 5' 3 (1.6 m)   Wt 152 lb 4 oz (69.1 kg)   SpO2 95%   BMI 26.97 kg/m  General:   Well developed, NAD, BMI noted. HEENT:  Normocephalic . Face symmetric, atraumatic Lungs:  CTA B Normal respiratory effort, no intercostal retractions, no accessory muscle use. Heart: RRR,  no murmur.  Lower extremities: no pretibial edema bilaterally  Skin: See picture, R calf.  Lesion is soft.  Nontender. Neurologic:  alert & oriented X3.  Speech normal, gait appropriate for age and unassisted Psych--  Cognition and judgment appear intact.  Cooperative with normal attention span and  concentration.  Behavior appropriate. No anxious or depressed appearing.      Assessment    Assessment DM A1c 6.8 (04-2019) Hyperlipidemia HTN Hypothyroidism Gout  Autoimmune hepatitis Carotid artery disease US  11-2014: 60-79% RICA stenosis. 40-59% LICA stenosis US  02/2016: 1-39 % B. 1 year US  06/2017 1-39%, no further routine US  DJD, severe at the neck GERD, dysphagia remotely Osteopenia: -s/p fosamax 2007 to 2012. -T score -1.7 (01-2015)  -T score -1.8 (04-2017)  - T-score -1.8 (06-2021) + Vitamin D  deficiency Lymphoma,  Neck, 1970s Syncope, admitted-2014, workup negative Shingles: 10/02/2016, left arm Chest pain: Cath 07/19/2018, negative, pericarditis? Skin Ca SCC , see Dr [place]  PLAN: DM: Diet controlled, last A1c 7.0.  Recheck.   HTN, on hydrochlorothiazide , check BMP and CBC. Hypothyroidism: Back in January, TSH was slightly suppressed.  Subsequent TSH okay.  She takes levothyroxine  at nighttime away from omeprazole .  Check TSH. High cholesterol: Last LDL 62.  Continue atorvastatin  Skin lesion: See picture, noted a week ago after she shaved.  Recommend observation, if the area gets worse, hard or darker needs to see dermatology sooner that the planned OV in December. Vaccine advice: Had a COVID-vaccine in October 2024.  Booster is appropriate this fall Recommend flu shot this fall. RTC 10-2024 CPX

## 2024-05-17 NOTE — Patient Instructions (Signed)
 Vaccines I recommend: A flu shot and a COVID booster this fall.  Check the  blood pressure regularly Blood pressure goal:  between 110/65 and  135/85. If it is consistently higher or lower, let me know     GO TO THE LAB :  Get the blood work   Your results will be posted on MyChart with my comments  Next office visit for a physical exam by 10/2024 Please make an appointment before you leave today

## 2024-05-17 NOTE — Assessment & Plan Note (Signed)
 DM: Diet controlled, last A1c 7.0.  Recheck.   HTN, on hydrochlorothiazide , check BMP and CBC. Hypothyroidism: Back in January, TSH was slightly suppressed.  Subsequent TSH okay.  She takes levothyroxine  at nighttime away from omeprazole .  Check TSH. High cholesterol: Last LDL 62.  Continue atorvastatin  Skin lesion: See picture, noted a week ago after she shaved.  Recommend observation, if the area gets worse, hard or darker needs to see dermatology sooner that the planned OV in December. Vaccine advice: Had a COVID-vaccine in October 2024.  Booster is appropriate this fall Recommend flu shot this fall. RTC 10-2024 CPX

## 2024-05-18 ENCOUNTER — Ambulatory Visit: Payer: Self-pay | Admitting: Internal Medicine

## 2024-05-18 ENCOUNTER — Other Ambulatory Visit (INDEPENDENT_AMBULATORY_CARE_PROVIDER_SITE_OTHER)

## 2024-05-18 DIAGNOSIS — E039 Hypothyroidism, unspecified: Secondary | ICD-10-CM | POA: Diagnosis not present

## 2024-05-18 DIAGNOSIS — I10 Essential (primary) hypertension: Secondary | ICD-10-CM | POA: Diagnosis not present

## 2024-05-18 DIAGNOSIS — E119 Type 2 diabetes mellitus without complications: Secondary | ICD-10-CM | POA: Diagnosis not present

## 2024-05-18 LAB — CBC WITH DIFFERENTIAL/PLATELET
Basophils Absolute: 0 K/uL (ref 0.0–0.1)
Basophils Relative: 0.7 % (ref 0.0–3.0)
Eosinophils Absolute: 0.2 K/uL (ref 0.0–0.7)
Eosinophils Relative: 4 % (ref 0.0–5.0)
HCT: 36.7 % (ref 36.0–46.0)
Hemoglobin: 12.1 g/dL (ref 12.0–15.0)
Lymphocytes Relative: 21.7 % (ref 12.0–46.0)
Lymphs Abs: 1.2 K/uL (ref 0.7–4.0)
MCHC: 33 g/dL (ref 30.0–36.0)
MCV: 85.2 fl (ref 78.0–100.0)
Monocytes Absolute: 0.4 K/uL (ref 0.1–1.0)
Monocytes Relative: 6.4 % (ref 3.0–12.0)
Neutro Abs: 3.8 K/uL (ref 1.4–7.7)
Neutrophils Relative %: 67.2 % (ref 43.0–77.0)
Platelets: 199 K/uL (ref 150.0–400.0)
RBC: 4.31 Mil/uL (ref 3.87–5.11)
RDW: 15.3 % (ref 11.5–15.5)
WBC: 5.6 K/uL (ref 4.0–10.5)

## 2024-05-18 LAB — BASIC METABOLIC PANEL WITH GFR
BUN: 13 mg/dL (ref 6–23)
CO2: 28 meq/L (ref 19–32)
Calcium: 9.4 mg/dL (ref 8.4–10.5)
Chloride: 96 meq/L (ref 96–112)
Creatinine, Ser: 0.79 mg/dL (ref 0.40–1.20)
GFR: 66.76 mL/min (ref >60.00)
Glucose, Bld: 148 mg/dL — ABNORMAL HIGH (ref 70–99)
Potassium: 4.4 meq/L (ref 3.5–5.1)
Sodium: 133 meq/L — ABNORMAL LOW (ref 135–145)

## 2024-05-18 LAB — HEMOGLOBIN A1C: Hgb A1c MFr Bld: 6.3 % (ref 4.6–6.5)

## 2024-05-18 LAB — TSH: TSH: 0.95 u[IU]/mL (ref 0.35–5.50)

## 2024-06-04 ENCOUNTER — Other Ambulatory Visit: Payer: Self-pay | Admitting: Internal Medicine

## 2024-06-16 ENCOUNTER — Encounter: Admitting: Radiology

## 2024-08-05 ENCOUNTER — Other Ambulatory Visit: Payer: Self-pay | Admitting: Internal Medicine

## 2024-08-11 DIAGNOSIS — Z961 Presence of intraocular lens: Secondary | ICD-10-CM | POA: Diagnosis not present

## 2024-08-11 DIAGNOSIS — H52203 Unspecified astigmatism, bilateral: Secondary | ICD-10-CM | POA: Diagnosis not present

## 2024-08-11 DIAGNOSIS — E119 Type 2 diabetes mellitus without complications: Secondary | ICD-10-CM | POA: Diagnosis not present

## 2024-08-11 DIAGNOSIS — H35371 Puckering of macula, right eye: Secondary | ICD-10-CM | POA: Diagnosis not present

## 2024-08-24 DIAGNOSIS — Z85828 Personal history of other malignant neoplasm of skin: Secondary | ICD-10-CM | POA: Diagnosis not present

## 2024-08-24 DIAGNOSIS — D044 Carcinoma in situ of skin of scalp and neck: Secondary | ICD-10-CM | POA: Diagnosis not present

## 2024-08-24 DIAGNOSIS — L820 Inflamed seborrheic keratosis: Secondary | ICD-10-CM | POA: Diagnosis not present

## 2024-08-24 DIAGNOSIS — D485 Neoplasm of uncertain behavior of skin: Secondary | ICD-10-CM | POA: Diagnosis not present

## 2024-08-24 DIAGNOSIS — C4442 Squamous cell carcinoma of skin of scalp and neck: Secondary | ICD-10-CM | POA: Diagnosis not present

## 2024-08-24 DIAGNOSIS — L570 Actinic keratosis: Secondary | ICD-10-CM | POA: Diagnosis not present

## 2024-08-24 DIAGNOSIS — L821 Other seborrheic keratosis: Secondary | ICD-10-CM | POA: Diagnosis not present

## 2024-09-08 ENCOUNTER — Ambulatory Visit: Payer: Medicare HMO | Admitting: *Deleted

## 2024-09-08 VITALS — BP 135/56 | HR 66 | Temp 97.6°F | Resp 18 | Ht 63.0 in | Wt 157.4 lb

## 2024-09-08 DIAGNOSIS — Z0000 Encounter for general adult medical examination without abnormal findings: Secondary | ICD-10-CM | POA: Diagnosis not present

## 2024-09-08 NOTE — Patient Instructions (Addendum)
 Angela Clay,  Thank you for taking the time for your Medicare Wellness Visit. I appreciate your continued commitment to your health goals. Please review the care plan we discussed, and feel free to reach out if I can assist you further.  Please note that Annual Wellness Visits do not include a physical exam. Some assessments may be limited, especially if the visit was conducted virtually. If needed, we may recommend an in-person follow-up with your provider.  GOALS:  To walk twice a week 15 min each day   Ongoing Care Seeing your primary care provider every 3 to 6 months helps us  monitor your health and provide consistent, personalized care.   Dr Amon: 11/22/24 10:20am, (bring your blood pressure machine from home to check with out in office reading)  Annual Wellness Visit: 09/12/25  10:20am in person  Recommended Screenings:  Health Maintenance  Topic Date Due   Complete foot exam   07/02/2023   Eye exam for diabetics  08/02/2024   Medicare Annual Wellness Visit  09/07/2024   Hemoglobin A1C  11/18/2024   DTaP/Tdap/Td vaccine (4 - Td or Tdap) 08/03/2029   Pneumococcal Vaccine for age over 71  Completed   Flu Shot  Completed   DEXA scan (bone density measurement)  Completed   Zoster (Shingles) Vaccine  Completed   Meningitis B Vaccine  Aged Out   Breast Cancer Screening  Discontinued   COVID-19 Vaccine  Discontinued       09/08/2023    1:13 PM  Advanced Directives  Does Patient Have a Medical Advance Directive? Yes  Type of Estate Agent of [place];Living will  Does patient want to make changes to medical advance directive? No - Patient declined  Copy of Healthcare Power of Attorney in Chart? Yes - validated most recent copy scanned in chart (See row information)   Once completed and notarized, you may return a copy of your Advanced Directive(s) by either of the following:  Bring a copy of your health care power of attorney and living will to the office  to be added to your chart at your convenience. You can mail a copy to [facility] [address] [place], [place] [address] or email to [email or web address]   Vision: Annual vision screenings are recommended for early detection of glaucoma, cataracts, and diabetic retinopathy. These exams can also reveal signs of chronic conditions such as diabetes and high blood pressure.  Dental: Annual dental screenings help detect early signs of oral cancer, gum disease, and other conditions linked to overall health, including heart disease and diabetes.  Please see the attached documents for additional preventive care recommendations.

## 2024-09-08 NOTE — Progress Notes (Signed)
 Please attest this visit in the absence of patient primary care provider.    Chief Complaint  Patient presents with   Medicare Wellness     Subjective:   Angela Clay is a 89 y.o. female who presents for a Medicare Annual Wellness Visit.  Allergies (verified) Codeine   History: Past Medical History:  Diagnosis Date   Actinic keratosis 05/2015   Right mid central dorsal hand   Autoimmune hepatitis (HCC)    Autoimmune hepatitis (HCC)    Cataract 04/2018   surgery delayed/now completed   Gallstones    GERD (gastroesophageal reflux disease)    EGD for dysphagia 2000 aprox (-)   History of radiation therapy    Hyperlipidemia    Hypertension    Hypothyroidism    Lymphoma (HCC)    neck,mlig,unspecified site dx 1970s   OA (osteoarthritis)    severe at neck   Osteopenia 07/20/2023   FRAX - increased risk of hip fracture   Pancreatic cyst    Pancreatic cyst    SCC (squamous cell carcinoma)    Syncope 11/2012   admited . w/u (-)   Thyroid  cancer [facility])    Past Surgical History:  Procedure Laterality Date   APPENDECTOMY     BELPHAROPTOSIS REPAIR Bilateral 2017   BIOPSY  11/23/2019   Procedure: BIOPSY;  Surgeon: Wilhelmenia Aloha Raddle., MD;  Location: [facility];  Service: Gastroenterology;;   CATARACT EXTRACTION, BILATERAL Bilateral 01/10/2019   Dr.Rankin   CHOLECYSTECTOMY  02/24/2006   ESOPHAGOGASTRODUODENOSCOPY (EGD) WITH PROPOFOL  N/A 11/23/2019   Procedure: ESOPHAGOGASTRODUODENOSCOPY (EGD) WITH PROPOFOL ;  Surgeon: Wilhelmenia Aloha Raddle., MD;  Location: [facility];  Service: Gastroenterology;  Laterality: N/A;   EXPLORATORY LAPAROTOMY     for lymphadenomopathy; appendix removed at this time   LEFT HEART CATH AND CORONARY ANGIOGRAPHY N/A 07/20/2018   Procedure: LEFT HEART CATH AND CORONARY ANGIOGRAPHY;  Surgeon: Claudene Victory ORN, MD;  [facility];  Service: Cardiovascular;  Laterality: N/A;   LIVER BIOPSY     THYROIDECTOMY     TONSILLECTOMY AND  ADENOIDECTOMY  1949   TUBAL LIGATION  1972   UPPER ESOPHAGEAL ENDOSCOPIC ULTRASOUND (EUS) N/A 11/23/2019   Procedure: UPPER ESOPHAGEAL ENDOSCOPIC ULTRASOUND (EUS);  Surgeon: Wilhelmenia Aloha Raddle., MD;  Location: [facility];  Service: Gastroenterology;  Laterality: N/A;  WITH BIOPSY   NEEDS 90 MINUTES   vitamacular hole surgery Right 2020   eye   Family History  Problem Relation Age of Onset   Stroke Mother 66   Arthritis Mother    Coronary artery disease Father        MI age 47   Early death Father    Heart disease Father    Deep vein thrombosis Brother        PE at age 58   Alcohol abuse Brother    Uterine cancer Sister    Heart failure Sister    Diabetes Neg Hx    Colon cancer Neg Hx    Breast cancer Neg Hx    Inflammatory bowel disease Neg Hx    Liver disease Neg Hx    Pancreatic cancer Neg Hx    Rectal cancer Neg Hx    Stomach cancer Neg Hx    Social History   Occupational History   Occupation: retired  Tobacco Use   Smoking status: Never    Passive exposure: Past   Smokeless tobacco: Never  Vaping Use   Vaping status: Never Used  Substance and Sexual Activity  Alcohol use: No    Alcohol/week: 0.0 standard drinks of alcohol   Drug use: Never   Sexual activity: Not Currently    Partners: Male    Birth control/protection: Post-menopausal    Comment: sexual debut over 16yo, Less than 5 lifetime partners, no hx STI, no hx DES exposure   Tobacco Counseling Counseling given: Not Answered  SDOH Screenings   Food Insecurity: No Food Insecurity (09/08/2024)  Housing: Low Risk  (09/08/2024)  Transportation Needs: No Transportation Needs (09/08/2024)  Utilities: Not At Risk (09/08/2024)  Alcohol Screen: Low Risk  (09/08/2023)  Depression (PHQ2-9): Low Risk  (09/08/2024)  Financial Resource Strain: Low Risk  (05/10/2024)  Physical Activity: Inactive (09/08/2024)  Social Connections: Moderately Integrated (09/08/2024)  Stress: No Stress Concern Present  (09/08/2024)  Tobacco Use: Low Risk  (09/08/2024)  Health Literacy: Adequate Health Literacy (09/08/2023)   See flowsheets for full screening details  Depression Screen PHQ 2 & 9 Depression Scale- Over the past 2 weeks, how often have you been bothered by any of the following problems? Little interest or pleasure in doing things: 0 Feeling down, depressed, or hopeless (PHQ Adolescent also includes...irritable): 1 (thinking on anniversary and spouse's death. Directs thoughts to their good times.) PHQ-2 Total Score: 1 Trouble falling or staying asleep, or sleeping too much: 0 (Has restless nights occasionally with bathroom trip) Feeling tired or having little energy: 0 Poor appetite or overeating (PHQ Adolescent also includes...weight loss): 0 Feeling bad about yourself - or that you are a failure or have let yourself or your family down: 0 Trouble concentrating on things, such as reading the newspaper or watching television (PHQ Adolescent also includes...like school work): 0 Moving or speaking so slowly that other people could have noticed. Or the opposite - being so fidgety or restless that you have been moving around a lot more than usual: 0 Thoughts that you would be better off dead, or of hurting yourself in some way: 0 PHQ-9 Total Score: 1 If you checked off any problems, how difficult have these problems made it for you to do your work, take care of things at home, or get along with other people?: Not difficult at all  Depression Treatment Depression Interventions/Treatment : [id number] Score <4 Follow-up Not Indicated     Goals Addressed             This Visit's Progress    To walk twice a week 15 min each day         Visit info / Clinical Intake: Medicare Wellness Visit Type:: Subsequent Annual Wellness Visit Persons participating in visit:: patient Medicare Wellness Visit Mode:: In-person (required for WTM) Information given by:: patient Interpreter Needed?:  No Pre-visit prep was completed: yes AWV questionnaire completed by patient prior to visit?: yes Date:: 09/01/24 Living arrangements:: (!) lives alone Patient's Overall Health Status Rating: excellent Typical amount of pain: some (has arthritis) Does pain affect daily life?: no Are you currently prescribed opioids?: no  Dietary Habits and Nutritional Risks How many meals a day?: 2 Eats fruit and vegetables daily?: yes Most meals are obtained by: preparing own meals In the last 2 weeks, have you had any of the following?: none Diabetic:: (!) yes Any non-healing wounds?: no How often do you check your BS?: 0 Would you like to be referred to a Nutritionist or for Diabetic Management? : no  Functional Status Activities of Daily Living (to include ambulation/medication): (Patient-Rptd) Independent Ambulation: (Patient-Rptd) Independent Medication Administration: Independent Home Management: (Patient-Rptd) Independent Manage  your own finances?: yes Primary transportation is: driving Concerns about vision?: no *vision screening is required for WTM* (up to date with [facility]) Concerns about hearing?: (!) yes (Notes some difficulty when watching game shows and would like to wait until next year for hearing testing) Uses hearing aids?: no  Fall Screening Falls in the past year?: (Patient-Rptd) 0 Number of falls in past year: (Patient-Rptd) 0 Was there an injury with Fall?: (Patient-Rptd) 0 Fall Risk Category Calculator: (Patient-Rptd) 0 Patient Fall Risk Level: (Patient-Rptd) Low Fall Risk  Fall Risk Patient at Risk for Falls Due to: No Fall Risks Fall risk Follow up: Falls evaluation completed  Home and Transportation Safety: All rugs have non-skid backing?: yes All stairs or steps have railings?: N/A, no stairs Grab bars in the bathtub or shower?: (!) no Have non-skid surface in bathtub or shower?: yes Good home lighting?: yes Regular seat belt use?:  yes Hospital stays in the last year:: no  Cognitive Assessment Difficulty concentrating, remembering, or making decisions? : yes (memory not as good as it used to be) Will 6CIT or Mini Cog be Completed: yes What year is it?: 0 points What month is it?: 0 points Give patient an address phrase to remember (5 components): [address], [place]  About what time is it?: 0 points Count backwards from 20 to 1: 0 points Say the months of the year in reverse: 0 points Repeat the address phrase from earlier: 2 points 6 CIT Score: 2 points  Advance Directives (For Healthcare) Does Patient Have a Medical Advance Directive?: Yes Does patient want to make changes to medical advance directive?: Yes (MAU/Ambulatory/Procedural Areas - Information given) Type of Advance Directive: Healthcare Power of [place]; Living will Copy of Healthcare Power of Attorney in Chart?: No - copy requested Copy of Living Will in Chart?: Yes - validated most recent copy scanned in chart (See row information)  Reviewed/Updated  Reviewed/Updated: Reviewed All (Medical, Surgical, Family, Medications, Allergies, Care Teams, Patient Goals)        Objective:    Today's Vitals   09/08/24 0952  BP: (!) 135/56  Pulse: 66  Resp: 18  Temp: 97.6 F (36.4 C)  TempSrc: Oral  SpO2: 97%  Weight: 157 lb 6.4 oz (71.4 kg)  Height: 5' 3 (1.6 m)   Body mass index is 27.88 kg/m.  Current Medications (verified) Outpatient Encounter Medications as of 09/08/2024  Medication Sig   ACETAMINOPHEN  PO Take 650 mg by mouth every 6 (six) hours as needed for mild pain. Tylenol  Arthritis   aspirin  81 MG tablet Take 81 mg by mouth at bedtime.   atorvastatin  (LIPITOR ) 40 MG tablet TAKE 1 TABLET BY MOUTH AT BEDTIME   Blood Pressure KIT Check blood pressures once daily as needed   Calcium  Carb-Cholecalciferol (CALCIUM  600 + D PO) Take 1 tablet by mouth daily.   Cholecalciferol (VITAMIN D3) 50 MCG (2000 UT) CAPS Take 2,000 Units  by mouth daily.   hydrochlorothiazide  (HYDRODIURIL ) 25 MG tablet Take 1 tablet (25 mg total) by mouth daily.   levothyroxine  (SYNTHROID ) 112 MCG tablet Take 1 tablet (112 mcg total) by mouth daily before breakfast.   Menthol, Topical Analgesic, (BIOFREEZE) 4 % GEL Apply topically.   Multiple Vitamins-Minerals (CENTRUM SILVER 50+WOMEN) TABS Take 1 tablet by mouth daily.   omeprazole  (PRILOSEC) 20 MG capsule Take 1 capsule (20 mg total) by mouth daily as needed.   Polyethyl Glycol-Propyl Glycol (SYSTANE ULTRA OP) Place 1 drop into both eyes daily as needed (  Dry eyes).   Tart Cherry 1200 MG CAPS Take 1 capsule by mouth daily.   No facility-administered encounter medications on file as of 09/08/2024.   Hearing/Vision screen No results found. Immunizations and Health Maintenance Health Maintenance  Topic Date Due   FOOT EXAM  07/02/2023   OPHTHALMOLOGY EXAM  08/02/2024   HEMOGLOBIN A1C  11/18/2024   Medicare Annual Wellness (AWV)  09/08/2025   DTaP/Tdap/Td (4 - Td or Tdap) 08/03/2029   Pneumococcal Vaccine: 50+ Years  Completed   Influenza Vaccine  Completed   DEXA SCAN  Completed   Zoster Vaccines- Shingrix   Completed   Meningococcal B Vaccine  Aged Out   Mammogram  Discontinued   COVID-19 Vaccine  Discontinued        Assessment/Plan:  This is a routine wellness examination for Angela Clay.  Patient Care Team: Amon Aloysius BRAVO, MD as PCP - Diedre Wonda Sharper, MD as PCP - Cardiology (Cardiology) Mable Lenis, MD (Inactive) as Consulting Physician (Otolaryngology) Jordan, Amy, MD as Consulting Physician (Dermatology) Charmayne Molly, MD as Consulting Physician (Ophthalmology) Carla Milling, RPH-CPP (Pharmacist)  I have personally reviewed and noted the following in the patient's chart:   Medical and social history Use of alcohol, tobacco or illicit drugs  Current medications and supplements including opioid prescriptions. Functional ability and status Nutritional  status Physical activity Advanced directives List of other physicians Hospitalizations, surgeries, and ER visits in previous 12 months Vitals Screenings to include cognitive, depression, and falls Referrals and appointments  No orders of the defined types were placed in this encounter.  In addition, I have reviewed and discussed with patient certain preventive protocols, quality metrics, and best practice recommendations. A written personalized care plan for preventive services as well as general preventive health recommendations were provided to patient.   Lolita Libra, CMA   09/08/2024   Return in 1 year (on 09/08/2025).  After Visit Summary: (In Person-Printed) AVS printed and given to the patient  Nurse Notes: nothing significant to report

## 2024-09-09 ENCOUNTER — Other Ambulatory Visit: Payer: Self-pay | Admitting: Internal Medicine

## 2024-11-22 ENCOUNTER — Encounter: Admitting: Internal Medicine

## 2024-11-25 ENCOUNTER — Encounter: Payer: Self-pay | Admitting: Internal Medicine

## 2024-11-28 ENCOUNTER — Encounter: Admitting: Internal Medicine

## 2025-01-04 ENCOUNTER — Encounter: Admitting: Internal Medicine

## 2025-01-20 ENCOUNTER — Encounter: Admitting: Internal Medicine

## 2025-09-12 ENCOUNTER — Ambulatory Visit
